# Patient Record
Sex: Male | Born: 1945 | Race: White | Hispanic: No | Marital: Married | Smoking: Never smoker
Health system: Southern US, Community
[De-identification: ages and names within clinical notes are randomized; demographics above are authoritative.]

## PROBLEM LIST (undated history)

## (undated) DIAGNOSIS — E669 Obesity, unspecified: Secondary | ICD-10-CM

## (undated) DIAGNOSIS — R351 Nocturia: Secondary | ICD-10-CM

## (undated) DIAGNOSIS — N400 Enlarged prostate without lower urinary tract symptoms: Secondary | ICD-10-CM

## (undated) DIAGNOSIS — K5792 Diverticulitis of intestine, part unspecified, without perforation or abscess without bleeding: Secondary | ICD-10-CM

## (undated) DIAGNOSIS — Z9889 Other specified postprocedural states: Secondary | ICD-10-CM

## (undated) DIAGNOSIS — Z87898 Personal history of other specified conditions: Secondary | ICD-10-CM

## (undated) DIAGNOSIS — K219 Gastro-esophageal reflux disease without esophagitis: Secondary | ICD-10-CM

## (undated) DIAGNOSIS — I951 Orthostatic hypotension: Secondary | ICD-10-CM

## (undated) DIAGNOSIS — R519 Headache, unspecified: Secondary | ICD-10-CM

## (undated) DIAGNOSIS — Z952 Presence of prosthetic heart valve: Secondary | ICD-10-CM

## (undated) DIAGNOSIS — R21 Rash and other nonspecific skin eruption: Secondary | ICD-10-CM

## (undated) DIAGNOSIS — M1990 Unspecified osteoarthritis, unspecified site: Secondary | ICD-10-CM

## (undated) DIAGNOSIS — E785 Hyperlipidemia, unspecified: Secondary | ICD-10-CM

## (undated) DIAGNOSIS — R319 Hematuria, unspecified: Secondary | ICD-10-CM

## (undated) DIAGNOSIS — N3020 Other chronic cystitis without hematuria: Secondary | ICD-10-CM

## (undated) DIAGNOSIS — R42 Dizziness and giddiness: Secondary | ICD-10-CM

## (undated) DIAGNOSIS — I712 Thoracic aortic aneurysm, without rupture, unspecified: Secondary | ICD-10-CM

## (undated) DIAGNOSIS — Z91041 Radiographic dye allergy status: Secondary | ICD-10-CM

## (undated) DIAGNOSIS — I10 Essential (primary) hypertension: Secondary | ICD-10-CM

## (undated) DIAGNOSIS — I350 Nonrheumatic aortic (valve) stenosis: Secondary | ICD-10-CM

## (undated) DIAGNOSIS — R55 Syncope and collapse: Secondary | ICD-10-CM

## (undated) DIAGNOSIS — R350 Frequency of micturition: Secondary | ICD-10-CM

## (undated) DIAGNOSIS — E871 Hypo-osmolality and hyponatremia: Secondary | ICD-10-CM

## (undated) DIAGNOSIS — R0609 Other forms of dyspnea: Secondary | ICD-10-CM

## (undated) DIAGNOSIS — C61 Malignant neoplasm of prostate: Secondary | ICD-10-CM

## (undated) DIAGNOSIS — R51 Headache: Secondary | ICD-10-CM

## (undated) HISTORY — DX: Gastro-esophageal reflux disease without esophagitis: K21.9

## (undated) HISTORY — DX: Syncope and collapse: R55

## (undated) HISTORY — DX: Dizziness and giddiness: R42

## (undated) HISTORY — PX: CARPAL TUNNEL RELEASE: SHX101

## (undated) HISTORY — DX: Diverticulitis of intestine, part unspecified, without perforation or abscess without bleeding: K57.92

## (undated) HISTORY — DX: Nonrheumatic aortic (valve) stenosis: I35.0

## (undated) HISTORY — DX: Other specified postprocedural states: Z98.890

## (undated) HISTORY — DX: Essential (primary) hypertension: I10

## (undated) HISTORY — DX: Orthostatic hypotension: I95.1

## (undated) HISTORY — DX: Other forms of dyspnea: R06.09

## (undated) HISTORY — PX: ROTATOR CUFF REPAIR: SHX139

## (undated) HISTORY — DX: Presence of prosthetic heart valve: Z95.2

## (undated) HISTORY — DX: Hyperlipidemia, unspecified: E78.5

## (undated) HISTORY — DX: Benign prostatic hyperplasia without lower urinary tract symptoms: N40.0

## (undated) HISTORY — DX: Radiographic dye allergy status: Z91.041

## (undated) HISTORY — DX: Thoracic aortic aneurysm, without rupture, unspecified: I71.20

## (undated) HISTORY — DX: Hypo-osmolality and hyponatremia: E87.1

## (undated) HISTORY — DX: Thoracic aortic aneurysm, without rupture: I71.2

---

## 1983-03-16 HISTORY — PX: BACK SURGERY: SHX140

## 1998-01-06 ENCOUNTER — Ambulatory Visit (HOSPITAL_COMMUNITY): Admission: RE | Admit: 1998-01-06 | Discharge: 1998-01-06 | Payer: Self-pay | Admitting: Family Medicine

## 1999-04-17 ENCOUNTER — Ambulatory Visit (HOSPITAL_COMMUNITY): Admission: RE | Admit: 1999-04-17 | Discharge: 1999-04-17 | Payer: Self-pay | Admitting: Gastroenterology

## 2000-04-04 ENCOUNTER — Encounter: Admission: RE | Admit: 2000-04-04 | Discharge: 2000-04-04 | Payer: Self-pay | Admitting: Family Medicine

## 2000-04-04 ENCOUNTER — Encounter: Payer: Self-pay | Admitting: Family Medicine

## 2001-07-18 ENCOUNTER — Encounter: Payer: Self-pay | Admitting: Family Medicine

## 2001-07-18 ENCOUNTER — Encounter: Admission: RE | Admit: 2001-07-18 | Discharge: 2001-07-18 | Payer: Self-pay | Admitting: Family Medicine

## 2002-01-16 ENCOUNTER — Encounter: Admission: RE | Admit: 2002-01-16 | Discharge: 2002-01-16 | Payer: Self-pay | Admitting: *Deleted

## 2002-01-16 ENCOUNTER — Encounter: Payer: Self-pay | Admitting: *Deleted

## 2002-01-19 ENCOUNTER — Ambulatory Visit (HOSPITAL_BASED_OUTPATIENT_CLINIC_OR_DEPARTMENT_OTHER): Admission: RE | Admit: 2002-01-19 | Discharge: 2002-01-19 | Payer: Self-pay | Admitting: *Deleted

## 2002-01-22 ENCOUNTER — Encounter: Payer: Self-pay | Admitting: Cardiology

## 2002-01-22 ENCOUNTER — Ambulatory Visit (HOSPITAL_COMMUNITY): Admission: RE | Admit: 2002-01-22 | Discharge: 2002-01-22 | Payer: Self-pay | Admitting: Cardiology

## 2002-02-21 ENCOUNTER — Ambulatory Visit (HOSPITAL_BASED_OUTPATIENT_CLINIC_OR_DEPARTMENT_OTHER): Admission: RE | Admit: 2002-02-21 | Discharge: 2002-02-21 | Payer: Self-pay | Admitting: *Deleted

## 2004-01-13 ENCOUNTER — Encounter: Admission: RE | Admit: 2004-01-13 | Discharge: 2004-01-13 | Payer: Self-pay | Admitting: Family Medicine

## 2004-08-25 ENCOUNTER — Ambulatory Visit (HOSPITAL_COMMUNITY): Admission: RE | Admit: 2004-08-25 | Discharge: 2004-08-25 | Payer: Self-pay | Admitting: Family Medicine

## 2004-11-26 ENCOUNTER — Inpatient Hospital Stay (HOSPITAL_COMMUNITY): Admission: EM | Admit: 2004-11-26 | Discharge: 2004-12-02 | Payer: Self-pay | Admitting: Emergency Medicine

## 2005-01-04 ENCOUNTER — Encounter: Admission: RE | Admit: 2005-01-04 | Discharge: 2005-01-04 | Payer: Self-pay | Admitting: General Surgery

## 2005-01-29 ENCOUNTER — Ambulatory Visit (HOSPITAL_COMMUNITY): Admission: RE | Admit: 2005-01-29 | Discharge: 2005-01-30 | Payer: Self-pay | Admitting: General Surgery

## 2005-01-29 ENCOUNTER — Encounter (INDEPENDENT_AMBULATORY_CARE_PROVIDER_SITE_OTHER): Payer: Self-pay | Admitting: Specialist

## 2005-05-21 ENCOUNTER — Emergency Department (HOSPITAL_COMMUNITY): Admission: EM | Admit: 2005-05-21 | Discharge: 2005-05-21 | Payer: Self-pay | Admitting: Emergency Medicine

## 2006-03-15 HISTORY — PX: COLONOSCOPY: SHX174

## 2007-01-09 ENCOUNTER — Encounter: Admission: RE | Admit: 2007-01-09 | Discharge: 2007-01-09 | Payer: Self-pay | Admitting: *Deleted

## 2007-01-11 ENCOUNTER — Ambulatory Visit (HOSPITAL_BASED_OUTPATIENT_CLINIC_OR_DEPARTMENT_OTHER): Admission: RE | Admit: 2007-01-11 | Discharge: 2007-01-11 | Payer: Self-pay | Admitting: *Deleted

## 2007-08-16 ENCOUNTER — Encounter: Admission: RE | Admit: 2007-08-16 | Discharge: 2007-08-16 | Payer: Self-pay | Admitting: Family Medicine

## 2007-11-14 ENCOUNTER — Inpatient Hospital Stay (HOSPITAL_COMMUNITY): Admission: RE | Admit: 2007-11-14 | Discharge: 2007-11-23 | Payer: Self-pay | Admitting: General Surgery

## 2007-11-14 ENCOUNTER — Encounter (INDEPENDENT_AMBULATORY_CARE_PROVIDER_SITE_OTHER): Payer: Self-pay | Admitting: General Surgery

## 2008-03-15 HISTORY — PX: COLON SURGERY: SHX602

## 2008-03-15 HISTORY — PX: CHOLECYSTECTOMY: SHX55

## 2008-12-03 DIAGNOSIS — M1990 Unspecified osteoarthritis, unspecified site: Secondary | ICD-10-CM | POA: Insufficient documentation

## 2008-12-16 DIAGNOSIS — M2040 Other hammer toe(s) (acquired), unspecified foot: Secondary | ICD-10-CM | POA: Insufficient documentation

## 2009-03-15 DIAGNOSIS — C61 Malignant neoplasm of prostate: Secondary | ICD-10-CM

## 2009-03-15 HISTORY — PX: DOPPLER ECHOCARDIOGRAPHY: SHX263

## 2009-03-15 HISTORY — DX: Malignant neoplasm of prostate: C61

## 2009-03-15 HISTORY — PX: PROSTATE SURGERY: SHX751

## 2009-04-09 ENCOUNTER — Ambulatory Visit: Admission: RE | Admit: 2009-04-09 | Discharge: 2009-07-08 | Payer: Self-pay | Admitting: Radiation Oncology

## 2009-07-01 ENCOUNTER — Ambulatory Visit: Admission: RE | Admit: 2009-07-01 | Discharge: 2009-07-01 | Payer: Self-pay | Admitting: Radiation Oncology

## 2009-07-01 LAB — URINALYSIS, MICROSCOPIC - CHCC
Nitrite: NEGATIVE
Protein: NEGATIVE mg/dL
pH: 6 (ref 4.6–8.0)

## 2009-07-08 ENCOUNTER — Ambulatory Visit
Admission: RE | Admit: 2009-07-08 | Discharge: 2009-07-31 | Payer: Self-pay | Source: Home / Self Care | Admitting: Radiation Oncology

## 2009-07-22 LAB — URINALYSIS, MICROSCOPIC - CHCC
Glucose: NEGATIVE g/dL
RBC count: NEGATIVE (ref 0–2)

## 2009-07-23 LAB — URINE CULTURE

## 2009-11-27 ENCOUNTER — Ambulatory Visit: Payer: Self-pay | Admitting: Cardiovascular Disease

## 2009-12-01 ENCOUNTER — Inpatient Hospital Stay (HOSPITAL_BASED_OUTPATIENT_CLINIC_OR_DEPARTMENT_OTHER): Admission: RE | Admit: 2009-12-01 | Discharge: 2009-12-01 | Payer: Self-pay | Admitting: Cardiovascular Disease

## 2009-12-04 ENCOUNTER — Ambulatory Visit: Payer: Self-pay | Admitting: Cardiothoracic Surgery

## 2009-12-05 ENCOUNTER — Encounter: Admission: RE | Admit: 2009-12-05 | Discharge: 2009-12-05 | Payer: Self-pay | Admitting: Cardiothoracic Surgery

## 2009-12-09 ENCOUNTER — Encounter: Payer: Self-pay | Admitting: Cardiothoracic Surgery

## 2009-12-11 ENCOUNTER — Encounter: Payer: Self-pay | Admitting: Cardiothoracic Surgery

## 2009-12-11 ENCOUNTER — Ambulatory Visit: Payer: Self-pay | Admitting: Cardiothoracic Surgery

## 2009-12-11 ENCOUNTER — Inpatient Hospital Stay (HOSPITAL_COMMUNITY): Admission: RE | Admit: 2009-12-11 | Discharge: 2009-12-17 | Payer: Self-pay | Admitting: Cardiothoracic Surgery

## 2009-12-11 HISTORY — PX: THORACIC AORTIC ANEURYSM REPAIR: SHX799

## 2009-12-11 HISTORY — PX: TISSUE AORTIC VALVE REPLACEMENT: SHX2527

## 2009-12-29 ENCOUNTER — Encounter: Admission: RE | Admit: 2009-12-29 | Discharge: 2009-12-29 | Payer: Self-pay | Admitting: Cardiothoracic Surgery

## 2009-12-29 ENCOUNTER — Ambulatory Visit: Payer: Self-pay | Admitting: Cardiothoracic Surgery

## 2010-01-02 ENCOUNTER — Ambulatory Visit: Payer: Self-pay | Admitting: Cardiology

## 2010-01-02 ENCOUNTER — Inpatient Hospital Stay (HOSPITAL_COMMUNITY): Admission: EM | Admit: 2010-01-02 | Discharge: 2010-01-05 | Payer: Self-pay | Admitting: Emergency Medicine

## 2010-01-03 ENCOUNTER — Encounter (INDEPENDENT_AMBULATORY_CARE_PROVIDER_SITE_OTHER): Payer: Self-pay | Admitting: Internal Medicine

## 2010-01-14 ENCOUNTER — Encounter: Admission: RE | Admit: 2010-01-14 | Discharge: 2010-01-14 | Payer: Self-pay | Admitting: Cardiothoracic Surgery

## 2010-01-14 ENCOUNTER — Ambulatory Visit: Payer: Self-pay | Admitting: Cardiothoracic Surgery

## 2010-01-15 ENCOUNTER — Ambulatory Visit: Payer: Self-pay | Admitting: Cardiovascular Disease

## 2010-01-15 ENCOUNTER — Encounter (HOSPITAL_COMMUNITY)
Admission: RE | Admit: 2010-01-15 | Discharge: 2010-04-14 | Payer: Self-pay | Source: Home / Self Care | Attending: Cardiovascular Disease | Admitting: Cardiovascular Disease

## 2010-02-04 ENCOUNTER — Ambulatory Visit: Payer: Self-pay | Admitting: Cardiothoracic Surgery

## 2010-02-16 ENCOUNTER — Encounter: Payer: Self-pay | Admitting: Internal Medicine

## 2010-02-16 DIAGNOSIS — J309 Allergic rhinitis, unspecified: Secondary | ICD-10-CM | POA: Insufficient documentation

## 2010-02-18 ENCOUNTER — Encounter: Payer: Self-pay | Admitting: Internal Medicine

## 2010-02-18 ENCOUNTER — Ambulatory Visit: Payer: Self-pay | Admitting: Internal Medicine

## 2010-02-18 DIAGNOSIS — R42 Dizziness and giddiness: Secondary | ICD-10-CM | POA: Insufficient documentation

## 2010-02-18 DIAGNOSIS — R55 Syncope and collapse: Secondary | ICD-10-CM | POA: Insufficient documentation

## 2010-02-18 HISTORY — DX: Syncope and collapse: R55

## 2010-03-10 ENCOUNTER — Ambulatory Visit: Payer: Self-pay | Admitting: Cardiovascular Disease

## 2010-03-11 ENCOUNTER — Encounter: Payer: Self-pay | Admitting: Internal Medicine

## 2010-03-11 ENCOUNTER — Ambulatory Visit: Payer: Self-pay | Admitting: Cardiothoracic Surgery

## 2010-03-15 HISTORY — PX: OTHER SURGICAL HISTORY: SHX169

## 2010-04-09 ENCOUNTER — Ambulatory Visit
Admission: RE | Admit: 2010-04-09 | Discharge: 2010-04-09 | Payer: Self-pay | Source: Home / Self Care | Attending: Internal Medicine | Admitting: Internal Medicine

## 2010-04-15 ENCOUNTER — Encounter (HOSPITAL_COMMUNITY): Payer: Medicare Other | Attending: Cardiovascular Disease

## 2010-04-15 DIAGNOSIS — I10 Essential (primary) hypertension: Secondary | ICD-10-CM | POA: Insufficient documentation

## 2010-04-15 DIAGNOSIS — I712 Thoracic aortic aneurysm, without rupture, unspecified: Secondary | ICD-10-CM | POA: Insufficient documentation

## 2010-04-15 DIAGNOSIS — Z7982 Long term (current) use of aspirin: Secondary | ICD-10-CM | POA: Insufficient documentation

## 2010-04-15 DIAGNOSIS — I359 Nonrheumatic aortic valve disorder, unspecified: Secondary | ICD-10-CM | POA: Insufficient documentation

## 2010-04-15 DIAGNOSIS — E785 Hyperlipidemia, unspecified: Secondary | ICD-10-CM | POA: Insufficient documentation

## 2010-04-15 DIAGNOSIS — Z87891 Personal history of nicotine dependence: Secondary | ICD-10-CM | POA: Insufficient documentation

## 2010-04-15 DIAGNOSIS — Z5189 Encounter for other specified aftercare: Secondary | ICD-10-CM | POA: Insufficient documentation

## 2010-04-15 DIAGNOSIS — I209 Angina pectoris, unspecified: Secondary | ICD-10-CM | POA: Insufficient documentation

## 2010-04-15 DIAGNOSIS — I509 Heart failure, unspecified: Secondary | ICD-10-CM | POA: Insufficient documentation

## 2010-04-15 DIAGNOSIS — Z8546 Personal history of malignant neoplasm of prostate: Secondary | ICD-10-CM | POA: Insufficient documentation

## 2010-04-15 DIAGNOSIS — Z954 Presence of other heart-valve replacement: Secondary | ICD-10-CM | POA: Insufficient documentation

## 2010-04-16 ENCOUNTER — Encounter (INDEPENDENT_AMBULATORY_CARE_PROVIDER_SITE_OTHER): Payer: Medicare Other

## 2010-04-16 ENCOUNTER — Encounter: Payer: Self-pay | Admitting: Internal Medicine

## 2010-04-16 DIAGNOSIS — R42 Dizziness and giddiness: Secondary | ICD-10-CM

## 2010-04-16 NOTE — Letter (Signed)
Summary: Return To Work  [employer], Main Office  [address]   [place], [place] [address]   Phone: [phone]  Fax: [phone]    02/18/2010  TO: Leodis Sias IT MAY CONCERN   RE: Jeff Johnson [address] [place]   The above named individual is under my medical care and may return to cardiac rehab on: 02/20/10.   If you have any further questions or need additional information, please call.     Sincerely,    Dr.Cidney Kirkwood Russ Halo, RN, BSN

## 2010-04-16 NOTE — Letter (Signed)
Summary: Return To Work  [employer], Main Office  [address]   [place], [place] [address]   Phone: [phone]  Fax: [phone]    02/18/2010  TO: Leodis Sias IT MAY CONCERN   RE: SHAYMUS EVELETH [address] [place]   The above named individual is under my medical care and may return to work on: 02/19/10.  If you have any further questions or need additional information, please call.     Sincerely,    Dr.Gregg Russ Halo, RN, BSN

## 2010-04-16 NOTE — Assessment & Plan Note (Signed)
Summary: nep. eval for tilt table. dx: post-op, orthostatic, hypotnesi...   Visit Type:  Follow-up   History of Present Illness: Jeff Johnson returns today for followup.  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  Post op he has had some problems with beta blocker resulting in worsening orthostasis.  He has improved greatly since stopping his beta blocker.  Since stopping his beta blocker he has improved with no syncope.    Current Medications (verified): 1)  Aspirin Ec 325 Mg Tbec (Aspirin) .... Take One Tablet By Mouth Daily 2)  Fluoxetine Hcl 20 Mg Caps (Fluoxetine Hcl) .... Once Daily 3)  Lansoprazole 30 Mg Tbdp (Lansoprazole) .... Once Daily 4)  Simvastatin 20 Mg Tabs (Simvastatin) .... Take One Tablet By Mouth Daily At Bedtime 5)  Tramadol Hcl 50 Mg Tabs (Tramadol Hcl) .... As Needed  Allergies (verified): 1)  ! * Ivp Dye  Past History:  Past Medical History: Last updated: 02/18/2010 Current Problems:  HYPOTENSION (ICD-458.9) ORTHOSTATIC DIZZINESS (ICD-780.4)    Past Surgical History: S/P AVR/aortic root repair.  Review of Systems       All systems reviewed and negative except as noted in the HPI.  Vital Signs:  Patient profile:   65 year old male Pulse (ortho):   82 / minute BP standing:   96 / 58  Vitals Entered By: Laurance Flatten CMA (February 18, 2010 2:48 PM)  Serial Vital Signs/Assessments:  Time      Position  BP       Pulse  Resp  Temp     By           Lying RA  110/71   80                    Jewel Hardy CMA           Sitting   110/62   68                    Jewel Hardy CMA           Standing  96/58    82                    Jewel Hardy CMA           Standing  110/60   62                    Jewel Hardy CMA           Standing  100/60   82                    Jewel Hardy CMA   Physical Exam  General:  Well developed, well nourished, in no acute distress.  HEENT: normal Neck: supple. No JVD. Carotids 2+  bilaterally no bruits Cor: RRR no rubs, gallops or murmur Lungs: CTA. His incision is well healed. Ab: soft, nontender. nondistended. No HSM. Good bowel sounds Ext: warm. no cyanosis, clubbing or edema Neuro: alert and oriented. Grossly nonfocal. affect pleasant    Impression & Recommendations:  Problem # 1:  ORTHOSTATIC DIZZINESS (ICD-780.4) His symptoms are much improved since he stopped taking his beta blocker. I have asked him to increase his sodium intake.  Problem # 2:  SYNCOPE (ICD-780.2) He has had no additional episodes. His updated medication list for this problem includes:    Aspirin  Ec 325 Mg Tbec (Aspirin) .Marland Kitchen... 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  Patient Instructions: 1)  Your physician wants you to follow-up in 3-4 months with Dr Court Joy will receive a reminder letter in the mail two months in advance. If you don't receive a letter, please call our office to schedule the follow-up appointment.

## 2010-04-16 NOTE — Assessment & Plan Note (Signed)
Summary: device/saf   Visit Type:  Follow-up   History of Present Illness: Jeff Johnson returns today for followup.  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  Post op he has had some problems with beta blocker resulting in worsening orthostasis.  When I last saw him he has had no frank syncope. He notes frequent episodes of dizziness and near syncope. He c/o feeling weak and fatigued. At times he is dizzy. He c/o urinary frequency. He is up nearly 6 times at night to urinate.  Current Medications (verified): 1)  Aspirin Ec 325 Mg Tbec (Aspirin) .... Take One Tablet By Mouth Daily 2)  Fluoxetine Hcl 20 Mg Caps (Fluoxetine Hcl) .... Once Daily 3)  Lansoprazole 30 Mg Tbdp (Lansoprazole) .... Once Daily 4)  Simvastatin 20 Mg Tabs (Simvastatin) .... Take One Tablet By Mouth Daily At Bedtime 5)  Jalyn 0.5-0.4 Mg Caps (Dutasteride-Tamsulosin Hcl) .... Once Daily  Allergies (verified): 1)  ! * Ivp Dye  Past History:  Past Medical History: Last updated: 02/18/2010 Current Problems:  HYPOTENSION (ICD-458.9) ORTHOSTATIC DIZZINESS (ICD-780.4)    Past Surgical History: Last updated: 02/18/2010 S/P AVR/aortic root repair.  Review of Systems       The patient complains of dyspnea on exertion.  The patient denies chest pain, syncope, and peripheral edema.    Vital Signs:  Patient profile:   65 year old male Height:      72 inches Weight:      232 pounds BMI:     31.58 Pulse rate:   64 / minute BP sitting:   120 / 70  (left arm)  Vitals Entered By: Laurance Flatten CMA (April 09, 2010 11:02 AM)  Physical Exam  General:  Well developed, well nourished, in no acute distress.  HEENT: normal Neck: supple. No JVD. Carotids 2+ bilaterally no bruits Cor: RRR no rubs, gallops or murmur Lungs: CTA. His incision is well healed. Ab: soft, nontender. nondistended. No HSM. Good bowel sounds Ext: warm. no cyanosis, clubbing or edema Neuro: alert and  oriented. Grossly nonfocal. affect pleasant    Impression & Recommendations:  Problem # 1:  ORTHOSTATIC DIZZINESS (ICD-780.4) It is unclear whether his symptoms are due to orthostasis or symptomatic arrhythmias. I have recommended a holter monitor and a 24 hour ambulatory blood pressure monitor.  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 The etiology of his current symptoms is unclear.  Patient Instructions: 1)  Your physician recommends that you schedule a follow-up appointment in: 3-4 weeks with Dr Ladona Ridgel 2)  Your physician has recommended that you wear a holter monitor.  Holter monitors are medical devices that record the heart's electrical activity. Doctors most often use these monitors to diagnose arrhythmias. Arrhythmias are problems with the speed or rhythm of the heartbeat. The monitor is a small, portable device. You can wear one while you do your normal daily activities. This is usually used to diagnose what is causing palpitations/syncope (passing out). 3)  24 hour BP monitor

## 2010-04-16 NOTE — Letter (Signed)
Summary: [facility] Office Visit Note   [facility] Office Visit Note   Imported By: Roderic Ovens 02/24/2010 15:28:00  _____________________________________________________________________  External Attachment:    Type:   Image     Comment:   External Document

## 2010-04-17 ENCOUNTER — Encounter (HOSPITAL_COMMUNITY): Payer: Medicare Other

## 2010-04-20 ENCOUNTER — Encounter (HOSPITAL_COMMUNITY): Payer: Medicare Other

## 2010-04-22 ENCOUNTER — Encounter (HOSPITAL_COMMUNITY): Payer: Medicare Other

## 2010-04-23 ENCOUNTER — Telehealth: Payer: Self-pay | Admitting: Internal Medicine

## 2010-04-24 ENCOUNTER — Encounter (HOSPITAL_COMMUNITY): Payer: Medicare Other

## 2010-04-27 ENCOUNTER — Encounter (HOSPITAL_COMMUNITY): Payer: Medicare Other

## 2010-04-29 ENCOUNTER — Encounter: Payer: Self-pay | Admitting: Internal Medicine

## 2010-04-29 ENCOUNTER — Encounter (HOSPITAL_COMMUNITY): Payer: Medicare Other

## 2010-04-30 NOTE — Progress Notes (Signed)
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  Wants to know if this is ok? Dennis Bast, RN, BSN  April 23, 2010 1:50 PM Dr Ladona Ridgel came over and looked at both BP monitor and holitor and said pt ok to return to work 25-45 hours per week.  Pt aware Dennis Bast, RN, BSN  April 23, 2010 3:59 PM

## 2010-05-01 ENCOUNTER — Encounter (HOSPITAL_COMMUNITY): Payer: Medicare Other

## 2010-05-04 ENCOUNTER — Encounter (HOSPITAL_COMMUNITY): Payer: Medicare Other

## 2010-05-06 ENCOUNTER — Other Ambulatory Visit: Payer: Self-pay | Admitting: Cardiothoracic Surgery

## 2010-05-06 ENCOUNTER — Encounter (HOSPITAL_COMMUNITY): Payer: Medicare Other

## 2010-05-06 DIAGNOSIS — I712 Thoracic aortic aneurysm, without rupture: Secondary | ICD-10-CM

## 2010-05-06 NOTE — Procedures (Signed)
Summary: summary report  summary report   Imported By: Mirna Mires 04/29/2010 11:36:45  _____________________________________________________________________  External Attachment:    Type:   Image     Comment:   External Document

## 2010-05-06 NOTE — Progress Notes (Signed)
Summary: [facility]: Office Visit  [facility]: Office Visit   Imported By: Earl Many 04/27/2010 09:27:27  _____________________________________________________________________  External Attachment:    Type:   Image     Comment:   External Document

## 2010-05-06 NOTE — Procedures (Signed)
Summary: bp readings  bp readings   Imported By: Mirna Mires 04/29/2010 11:38:42  _____________________________________________________________________  External Attachment:    Type:   Image     Comment:   External Document

## 2010-05-08 ENCOUNTER — Encounter (HOSPITAL_COMMUNITY): Payer: Medicare Other

## 2010-05-11 ENCOUNTER — Encounter (HOSPITAL_COMMUNITY): Payer: Medicare Other

## 2010-05-13 ENCOUNTER — Encounter (HOSPITAL_COMMUNITY): Payer: Medicare Other

## 2010-05-15 ENCOUNTER — Ambulatory Visit (INDEPENDENT_AMBULATORY_CARE_PROVIDER_SITE_OTHER): Payer: Medicare Other | Admitting: Cardiovascular Disease

## 2010-05-15 ENCOUNTER — Encounter (HOSPITAL_COMMUNITY): Payer: BLUE CROSS/BLUE SHIELD

## 2010-05-15 DIAGNOSIS — I2510 Atherosclerotic heart disease of native coronary artery without angina pectoris: Secondary | ICD-10-CM

## 2010-05-15 DIAGNOSIS — I359 Nonrheumatic aortic valve disorder, unspecified: Secondary | ICD-10-CM

## 2010-05-15 DIAGNOSIS — Z951 Presence of aortocoronary bypass graft: Secondary | ICD-10-CM

## 2010-05-15 DIAGNOSIS — E7800 Pure hypercholesterolemia, unspecified: Secondary | ICD-10-CM

## 2010-05-18 ENCOUNTER — Encounter (HOSPITAL_COMMUNITY): Payer: BLUE CROSS/BLUE SHIELD

## 2010-05-18 DIAGNOSIS — Z8679 Personal history of other diseases of the circulatory system: Secondary | ICD-10-CM | POA: Insufficient documentation

## 2010-05-20 ENCOUNTER — Encounter (HOSPITAL_COMMUNITY): Payer: BLUE CROSS/BLUE SHIELD

## 2010-05-22 ENCOUNTER — Encounter (HOSPITAL_COMMUNITY): Payer: BLUE CROSS/BLUE SHIELD

## 2010-05-25 ENCOUNTER — Encounter (HOSPITAL_COMMUNITY): Payer: BLUE CROSS/BLUE SHIELD

## 2010-05-27 ENCOUNTER — Encounter: Payer: Self-pay | Admitting: Internal Medicine

## 2010-05-27 ENCOUNTER — Encounter (HOSPITAL_COMMUNITY): Payer: BLUE CROSS/BLUE SHIELD

## 2010-05-27 LAB — GLUCOSE, CAPILLARY
Glucose-Capillary: 109 mg/dL — ABNORMAL HIGH (ref 70–99)
Glucose-Capillary: 111 mg/dL — ABNORMAL HIGH (ref 70–99)
Glucose-Capillary: 123 mg/dL — ABNORMAL HIGH (ref 70–99)

## 2010-05-27 LAB — BASIC METABOLIC PANEL
BUN: 6 mg/dL (ref 6–23)
BUN: 6 mg/dL (ref 6–23)
BUN: 6 mg/dL (ref 6–23)
BUN: 16 mg/dL (ref 6–23)
BUN: 18 mg/dL (ref 6–23)
BUN: 19 mg/dL (ref 6–23)
CO2: 20 mEq/L (ref 19–32)
CO2: 24 mEq/L (ref 19–32)
CO2: 28 mEq/L (ref 19–32)
CO2: 30 mEq/L (ref 19–32)
CO2: 31 mEq/L (ref 19–32)
Calcium: 8.5 mg/dL (ref 8.4–10.5)
Calcium: 6.8 mg/dL — ABNORMAL LOW (ref 8.4–10.5)
Calcium: 8.1 mg/dL — ABNORMAL LOW (ref 8.4–10.5)
Calcium: 8.2 mg/dL — ABNORMAL LOW (ref 8.4–10.5)
Chloride: 100 mEq/L (ref 96–112)
Chloride: 104 mEq/L (ref 96–112)
Chloride: 103 mEq/L (ref 96–112)
Chloride: 99 mEq/L (ref 96–112)
Chloride: 99 mEq/L (ref 96–112)
Creatinine, Ser: 1.09 mg/dL (ref 0.4–1.5)
Creatinine, Ser: 0.82 mg/dL (ref 0.4–1.5)
Creatinine, Ser: 0.92 mg/dL (ref 0.4–1.5)
Creatinine, Ser: 0.97 mg/dL (ref 0.4–1.5)
GFR calc Af Amer: >60 mL/min (ref >60)
GFR calc Af Amer: >60 mL/min (ref >60)
GFR calc Af Amer: >60 mL/min (ref >60)
GFR calc non Af Amer: >60 mL/min (ref >60)
GFR calc non Af Amer: >60 mL/min (ref >60)
GFR calc non Af Amer: >60 mL/min (ref >60)
GFR calc non Af Amer: >60 mL/min (ref >60)
Glucose, Bld: 101 mg/dL — ABNORMAL HIGH (ref 70–99)
Glucose, Bld: 103 mg/dL — ABNORMAL HIGH (ref 70–99)
Glucose, Bld: 104 mg/dL — ABNORMAL HIGH (ref 70–99)
Glucose, Bld: 119 mg/dL — ABNORMAL HIGH (ref 70–99)
Glucose, Bld: 126 mg/dL — ABNORMAL HIGH (ref 70–99)
Potassium: 4 mEq/L (ref 3.5–5.1)
Potassium: 4.1 mEq/L (ref 3.5–5.1)
Potassium: 4.1 mEq/L (ref 3.5–5.1)
Potassium: 3.8 mEq/L (ref 3.5–5.1)
Potassium: 4.3 mEq/L (ref 3.5–5.1)
Potassium: 5.2 mEq/L — ABNORMAL HIGH (ref 3.5–5.1)
Sodium: 132 mEq/L — ABNORMAL LOW (ref 135–145)
Sodium: 135 mEq/L (ref 135–145)
Sodium: 134 mEq/L — ABNORMAL LOW (ref 135–145)
Sodium: 136 mEq/L (ref 135–145)
Sodium: 137 mEq/L (ref 135–145)

## 2010-05-27 LAB — URINE CULTURE
Colony Count: 10000
Culture  Setup Time: 201110211150
Culture  Setup Time: 201110022055

## 2010-05-27 LAB — TROPONIN I
Troponin I: 0.01 ng/mL (ref 0.00–0.06)
Troponin I: <0.01 ng/mL (ref 0.00–0.06)

## 2010-05-27 LAB — URINALYSIS, ROUTINE W REFLEX MICROSCOPIC
Bilirubin Urine: NEGATIVE
Bilirubin Urine: NEGATIVE
Glucose, UA: NEGATIVE mg/dL
Glucose, UA: NEGATIVE mg/dL
Hgb urine dipstick: NEGATIVE
Ketones, ur: NEGATIVE mg/dL
Ketones, ur: NEGATIVE mg/dL
Leukocytes, UA: NEGATIVE
Nitrite: NEGATIVE
Nitrite: NEGATIVE
Protein, ur: NEGATIVE mg/dL
Specific Gravity, Urine: 1.011 (ref 1.005–1.030)
Specific Gravity, Urine: 1.016 (ref 1.005–1.030)
Urobilinogen, UA: 0.2 mg/dL (ref 0.0–1.0)
pH: 6.5 (ref 5.0–8.0)
pH: 6 (ref 5.0–8.0)

## 2010-05-27 LAB — CBC
HCT: 27.1 % — ABNORMAL LOW (ref 39.0–52.0)
HCT: 28 % — ABNORMAL LOW (ref 39.0–52.0)
HCT: 30.5 % — ABNORMAL LOW (ref 39.0–52.0)
HCT: 30.1 % — ABNORMAL LOW (ref 39.0–52.0)
Hemoglobin: 9 g/dL — ABNORMAL LOW (ref 13.0–17.0)
Hemoglobin: 9.4 g/dL — ABNORMAL LOW (ref 13.0–17.0)
Hemoglobin: 10 g/dL — ABNORMAL LOW (ref 13.0–17.0)
Hemoglobin: 9.9 g/dL — ABNORMAL LOW (ref 13.0–17.0)
MCH: 27.6 pg (ref 26.0–34.0)
MCH: 28.1 pg (ref 26.0–34.0)
MCH: 29.4 pg (ref 26.0–34.0)
MCH: 29.8 pg (ref 26.0–34.0)
MCHC: 33.2 g/dL (ref 30.0–36.0)
MCHC: 33.6 g/dL (ref 30.0–36.0)
MCHC: 32.9 g/dL (ref 30.0–36.0)
MCHC: 33.1 g/dL (ref 30.0–36.0)
MCV: 81.4 fL (ref 78.0–100.0)
MCV: 82.7 fL (ref 78.0–100.0)
MCV: 89.3 fL (ref 78.0–100.0)
Platelets: 351 10*3/uL (ref 150–400)
Platelets: 117 10*3/uL — ABNORMAL LOW (ref 150–400)
RBC: 3.69 MIL/uL — ABNORMAL LOW (ref 4.22–5.81)
RBC: 3.37 MIL/uL — ABNORMAL LOW (ref 4.22–5.81)
RDW: 13 % (ref 11.5–15.5)
RDW: 13.3 % (ref 11.5–15.5)
RDW: 13.3 % (ref 11.5–15.5)
RDW: 13.4 % (ref 11.5–15.5)
WBC: 16 10*3/uL — ABNORMAL HIGH (ref 4.0–10.5)

## 2010-05-27 LAB — DIFFERENTIAL
Eosinophils Absolute: 0.1 10*3/uL (ref 0.0–0.7)
Lymphocytes Relative: 19 % (ref 12–46)
Lymphs Abs: 1.2 10*3/uL (ref 0.7–4.0)
Monocytes Relative: 9 % (ref 3–12)
Neutrophils Relative %: 70 % (ref 43–77)

## 2010-05-27 LAB — HEPATIC FUNCTION PANEL
ALT: 24 U/L (ref 0–53)
AST: 21 U/L (ref 0–37)
Albumin: 3.2 g/dL — ABNORMAL LOW (ref 3.5–5.2)
Alkaline Phosphatase: 98 U/L (ref 39–117)
Indirect Bilirubin: 1.1 mg/dL — ABNORMAL HIGH (ref 0.3–0.9)
Total Protein: 6.7 g/dL (ref 6.0–8.3)

## 2010-05-27 LAB — CK TOTAL AND CKMB (NOT AT ARMC)
CK, MB: 1.2 ng/mL (ref 0.3–4.0)
Relative Index: INVALID (ref 0.0–2.5)

## 2010-05-27 LAB — URINE MICROSCOPIC-ADD ON

## 2010-05-28 LAB — POCT I-STAT 4, (NA,K, GLUC, HGB,HCT)
Glucose, Bld: 103 mg/dL — ABNORMAL HIGH (ref 70–99)
Glucose, Bld: 105 mg/dL — ABNORMAL HIGH (ref 70–99)
Glucose, Bld: 111 mg/dL — ABNORMAL HIGH (ref 70–99)
Glucose, Bld: 121 mg/dL — ABNORMAL HIGH (ref 70–99)
Glucose, Bld: 132 mg/dL — ABNORMAL HIGH (ref 70–99)
Glucose, Bld: 134 mg/dL — ABNORMAL HIGH (ref 70–99)
Glucose, Bld: 167 mg/dL — ABNORMAL HIGH (ref 70–99)
Glucose, Bld: 172 mg/dL — ABNORMAL HIGH (ref 70–99)
HCT: 26 % — ABNORMAL LOW (ref 39.0–52.0)
HCT: 26 % — ABNORMAL LOW (ref 39.0–52.0)
HCT: 29 % — ABNORMAL LOW (ref 39.0–52.0)
HCT: 30 % — ABNORMAL LOW (ref 39.0–52.0)
HCT: 30 % — ABNORMAL LOW (ref 39.0–52.0)
HCT: 34 % — ABNORMAL LOW (ref 39.0–52.0)
HCT: 37 % — ABNORMAL LOW (ref 39.0–52.0)
HCT: 37 % — ABNORMAL LOW (ref 39.0–52.0)
Hemoglobin: 10.2 g/dL — ABNORMAL LOW (ref 13.0–17.0)
Hemoglobin: 10.2 g/dL — ABNORMAL LOW (ref 13.0–17.0)
Hemoglobin: 11.6 g/dL — ABNORMAL LOW (ref 13.0–17.0)
Hemoglobin: 12.6 g/dL — ABNORMAL LOW (ref 13.0–17.0)
Hemoglobin: 12.6 g/dL — ABNORMAL LOW (ref 13.0–17.0)
Hemoglobin: 8.8 g/dL — ABNORMAL LOW (ref 13.0–17.0)
Hemoglobin: 8.8 g/dL — ABNORMAL LOW (ref 13.0–17.0)
Hemoglobin: 9.9 g/dL — ABNORMAL LOW (ref 13.0–17.0)
Potassium: 3.7 mEq/L (ref 3.5–5.1)
Potassium: 4 mEq/L (ref 3.5–5.1)
Potassium: 4.1 mEq/L (ref 3.5–5.1)
Potassium: 4.1 mEq/L (ref 3.5–5.1)
Potassium: 4.3 mEq/L (ref 3.5–5.1)
Potassium: 4.6 mEq/L (ref 3.5–5.1)
Potassium: 4.8 mEq/L (ref 3.5–5.1)
Potassium: 5 mEq/L (ref 3.5–5.1)
Sodium: 133 mEq/L — ABNORMAL LOW (ref 135–145)
Sodium: 133 mEq/L — ABNORMAL LOW (ref 135–145)
Sodium: 135 mEq/L (ref 135–145)
Sodium: 136 mEq/L (ref 135–145)
Sodium: 138 mEq/L (ref 135–145)
Sodium: 139 mEq/L (ref 135–145)
Sodium: 139 mEq/L (ref 135–145)
Sodium: 140 mEq/L (ref 135–145)

## 2010-05-28 LAB — CBC
HCT: 31.2 % — ABNORMAL LOW (ref 39.0–52.0)
HCT: 32.4 % — ABNORMAL LOW (ref 39.0–52.0)
HCT: 35 % — ABNORMAL LOW (ref 39.0–52.0)
HCT: 36.5 % — ABNORMAL LOW (ref 39.0–52.0)
HCT: 39.9 % (ref 39.0–52.0)
Hemoglobin: 10.5 g/dL — ABNORMAL LOW (ref 13.0–17.0)
Hemoglobin: 11 g/dL — ABNORMAL LOW (ref 13.0–17.0)
Hemoglobin: 12 g/dL — ABNORMAL LOW (ref 13.0–17.0)
Hemoglobin: 12.5 g/dL — ABNORMAL LOW (ref 13.0–17.0)
Hemoglobin: 13.8 g/dL (ref 13.0–17.0)
MCH: 29.6 pg (ref 26.0–34.0)
MCH: 29.6 pg (ref 26.0–34.0)
MCH: 29.7 pg (ref 26.0–34.0)
MCH: 29.9 pg (ref 26.0–34.0)
MCH: 30 pg (ref 26.0–34.0)
MCHC: 33.7 g/dL (ref 30.0–36.0)
MCHC: 34 g/dL (ref 30.0–36.0)
MCHC: 34.2 g/dL (ref 30.0–36.0)
MCHC: 34.3 g/dL (ref 30.0–36.0)
MCHC: 34.6 g/dL (ref 30.0–36.0)
MCV: 86.4 fL (ref 78.0–100.0)
MCV: 86.7 fL (ref 78.0–100.0)
MCV: 86.7 fL (ref 78.0–100.0)
MCV: 87.1 fL (ref 78.0–100.0)
MCV: 88.9 fL (ref 78.0–100.0)
Platelets: 126 10*3/uL — ABNORMAL LOW (ref 150–400)
Platelets: 140 10*3/uL — ABNORMAL LOW (ref 150–400)
Platelets: 148 10*3/uL — ABNORMAL LOW (ref 150–400)
Platelets: 169 10*3/uL (ref 150–400)
Platelets: 175 10*3/uL (ref 150–400)
RBC: 3.51 MIL/uL — ABNORMAL LOW (ref 4.22–5.81)
RBC: 3.72 MIL/uL — ABNORMAL LOW (ref 4.22–5.81)
RBC: 4.05 MIL/uL — ABNORMAL LOW (ref 4.22–5.81)
RBC: 4.21 MIL/uL — ABNORMAL LOW (ref 4.22–5.81)
RBC: 4.6 MIL/uL (ref 4.22–5.81)
RDW: 12.7 % (ref 11.5–15.5)
RDW: 12.7 % (ref 11.5–15.5)
RDW: 12.8 % (ref 11.5–15.5)
RDW: 13 % (ref 11.5–15.5)
RDW: 13.4 % (ref 11.5–15.5)
WBC: 10.7 10*3/uL — ABNORMAL HIGH (ref 4.0–10.5)
WBC: 12.5 10*3/uL — ABNORMAL HIGH (ref 4.0–10.5)
WBC: 14.2 10*3/uL — ABNORMAL HIGH (ref 4.0–10.5)
WBC: 17.3 10*3/uL — ABNORMAL HIGH (ref 4.0–10.5)
WBC: 5.2 10*3/uL (ref 4.0–10.5)

## 2010-05-28 LAB — POCT I-STAT 3, ART BLOOD GAS (G3+)
Acid-base deficit: 1 mmol/L (ref 0.0–2.0)
Acid-base deficit: 3 mmol/L — ABNORMAL HIGH (ref 0.0–2.0)
Acid-base deficit: 4 mmol/L — ABNORMAL HIGH (ref 0.0–2.0)
Acid-base deficit: 4 mmol/L — ABNORMAL HIGH (ref 0.0–2.0)
Acid-base deficit: 4 mmol/L — ABNORMAL HIGH (ref 0.0–2.0)
Acid-base deficit: 5 mmol/L — ABNORMAL HIGH (ref 0.0–2.0)
Bicarbonate: 21.6 mEq/L (ref 20.0–24.0)
Bicarbonate: 21.9 mEq/L (ref 20.0–24.0)
Bicarbonate: 21.9 mEq/L (ref 20.0–24.0)
Bicarbonate: 22.1 mEq/L (ref 20.0–24.0)
Bicarbonate: 24 mEq/L (ref 20.0–24.0)
Bicarbonate: 25.1 mEq/L — ABNORMAL HIGH (ref 20.0–24.0)
Bicarbonate: 26.8 mEq/L — ABNORMAL HIGH (ref 20.0–24.0)
O2 Saturation: 100 %
O2 Saturation: 100 %
O2 Saturation: 100 %
O2 Saturation: 91 %
O2 Saturation: 97 %
O2 Saturation: 97 %
O2 Saturation: 99 %
Patient temperature: 35
Patient temperature: 36.6
Patient temperature: 36.7
Patient temperature: 37.3
TCO2: 23 mmol/L (ref 0–100)
TCO2: 23 mmol/L (ref 0–100)
TCO2: 23 mmol/L (ref 0–100)
TCO2: 23 mmol/L (ref 0–100)
TCO2: 25 mmol/L (ref 0–100)
TCO2: 26 mmol/L (ref 0–100)
TCO2: 29 mmol/L (ref 0–100)
pCO2 arterial: 39.9 mmHg (ref 35.0–45.0)
pCO2 arterial: 40 mmHg (ref 35.0–45.0)
pCO2 arterial: 43.1 mmHg (ref 35.0–45.0)
pCO2 arterial: 43.4 mmHg (ref 35.0–45.0)
pCO2 arterial: 43.7 mmHg (ref 35.0–45.0)
pCO2 arterial: 43.7 mmHg (ref 35.0–45.0)
pCO2 arterial: 47.7 mmHg — ABNORMAL HIGH (ref 35.0–45.0)
pCO2 arterial: 56.4 mmHg — ABNORMAL HIGH (ref 35.0–45.0)
pH, Arterial: 7.285 — ABNORMAL LOW (ref 7.350–7.450)
pH, Arterial: 7.307 — ABNORMAL LOW (ref 7.350–7.450)
pH, Arterial: 7.31 — ABNORMAL LOW (ref 7.350–7.450)
pH, Arterial: 7.311 — ABNORMAL LOW (ref 7.350–7.450)
pH, Arterial: 7.316 — ABNORMAL LOW (ref 7.350–7.450)
pH, Arterial: 7.332 — ABNORMAL LOW (ref 7.350–7.450)
pH, Arterial: 7.367 (ref 7.350–7.450)
pH, Arterial: 7.4 (ref 7.350–7.450)
pO2, Arterial: 101 mmHg — ABNORMAL HIGH (ref 80.0–100.0)
pO2, Arterial: 136 mmHg — ABNORMAL HIGH (ref 80.0–100.0)
pO2, Arterial: 194 mmHg — ABNORMAL HIGH (ref 80.0–100.0)
pO2, Arterial: 329 mmHg — ABNORMAL HIGH (ref 80.0–100.0)
pO2, Arterial: 373 mmHg — ABNORMAL HIGH (ref 80.0–100.0)
pO2, Arterial: 58 mmHg — ABNORMAL LOW (ref 80.0–100.0)
pO2, Arterial: 96 mmHg (ref 80.0–100.0)

## 2010-05-28 LAB — APTT
aPTT: 29 seconds (ref 24–37)
aPTT: 39 seconds — ABNORMAL HIGH (ref 24–37)

## 2010-05-28 LAB — COMPREHENSIVE METABOLIC PANEL
ALT: 15 U/L (ref 0–53)
AST: 19 U/L (ref 0–37)
Albumin: 3.7 g/dL (ref 3.5–5.2)
Alkaline Phosphatase: 63 U/L (ref 39–117)
BUN: 10 mg/dL (ref 6–23)
CO2: 22 mEq/L (ref 19–32)
Calcium: 8.7 mg/dL (ref 8.4–10.5)
Chloride: 106 mEq/L (ref 96–112)
Creatinine, Ser: 0.95 mg/dL (ref 0.4–1.5)
GFR calc Af Amer: >60 mL/min (ref >60)
GFR calc non Af Amer: >60 mL/min (ref >60)
Glucose, Bld: 147 mg/dL — ABNORMAL HIGH (ref 70–99)
Potassium: 4.1 mEq/L (ref 3.5–5.1)
Sodium: 137 mEq/L (ref 135–145)
Total Bilirubin: 1.7 mg/dL — ABNORMAL HIGH (ref 0.3–1.2)
Total Protein: 6.3 g/dL (ref 6.0–8.3)

## 2010-05-28 LAB — POCT I-STAT, CHEM 8
BUN: 10 mg/dL (ref 6–23)
BUN: 18 mg/dL (ref 6–23)
Calcium, Ion: 1.12 mmol/L (ref 1.12–1.32)
Calcium, Ion: 1.13 mmol/L (ref 1.12–1.32)
Chloride: 101 mEq/L (ref 96–112)
Chloride: 107 mEq/L (ref 96–112)
Creatinine, Ser: 0.8 mg/dL (ref 0.4–1.5)
Creatinine, Ser: 0.8 mg/dL (ref 0.4–1.5)
Glucose, Bld: 142 mg/dL — ABNORMAL HIGH (ref 70–99)
Glucose, Bld: 191 mg/dL — ABNORMAL HIGH (ref 70–99)
HCT: 32 % — ABNORMAL LOW (ref 39.0–52.0)
HCT: 35 % — ABNORMAL LOW (ref 39.0–52.0)
Hemoglobin: 10.9 g/dL — ABNORMAL LOW (ref 13.0–17.0)
Hemoglobin: 11.9 g/dL — ABNORMAL LOW (ref 13.0–17.0)
Potassium: 4.3 mEq/L (ref 3.5–5.1)
Potassium: 4.7 mEq/L (ref 3.5–5.1)
Sodium: 139 mEq/L (ref 135–145)
Sodium: 139 mEq/L (ref 135–145)
TCO2: 23 mmol/L (ref 0–100)
TCO2: 27 mmol/L (ref 0–100)

## 2010-05-28 LAB — GLUCOSE, CAPILLARY
Glucose-Capillary: 102 mg/dL — ABNORMAL HIGH (ref 70–99)
Glucose-Capillary: 119 mg/dL — ABNORMAL HIGH (ref 70–99)
Glucose-Capillary: 135 mg/dL — ABNORMAL HIGH (ref 70–99)
Glucose-Capillary: 145 mg/dL — ABNORMAL HIGH (ref 70–99)
Glucose-Capillary: 145 mg/dL — ABNORMAL HIGH (ref 70–99)
Glucose-Capillary: 149 mg/dL — ABNORMAL HIGH (ref 70–99)
Glucose-Capillary: 150 mg/dL — ABNORMAL HIGH (ref 70–99)
Glucose-Capillary: 151 mg/dL — ABNORMAL HIGH (ref 70–99)
Glucose-Capillary: 167 mg/dL — ABNORMAL HIGH (ref 70–99)
Glucose-Capillary: 168 mg/dL — ABNORMAL HIGH (ref 70–99)
Glucose-Capillary: 185 mg/dL — ABNORMAL HIGH (ref 70–99)
Glucose-Capillary: 193 mg/dL — ABNORMAL HIGH (ref 70–99)
Glucose-Capillary: 99 mg/dL (ref 70–99)

## 2010-05-28 LAB — HEMOGLOBIN AND HEMATOCRIT, BLOOD
HCT: 30.8 % — ABNORMAL LOW (ref 39.0–52.0)
Hemoglobin: 10.5 g/dL — ABNORMAL LOW (ref 13.0–17.0)

## 2010-05-28 LAB — TYPE AND SCREEN
ABO/RH(D): O POS
Antibody Screen: NEGATIVE

## 2010-05-28 LAB — BLOOD GAS, ARTERIAL
Acid-Base Excess: 0.4 mmol/L (ref 0.0–2.0)
Bicarbonate: 24.4 mEq/L — ABNORMAL HIGH (ref 20.0–24.0)
Drawn by: 206361
FIO2: 0.21 %
O2 Saturation: 98.2 %
Patient temperature: 98.6
TCO2: 25.6 mmol/L (ref 0–100)
pCO2 arterial: 38.4 mmHg (ref 35.0–45.0)
pH, Arterial: 7.419 (ref 7.350–7.450)
pO2, Arterial: 104 mmHg — ABNORMAL HIGH (ref 80.0–100.0)

## 2010-05-28 LAB — CREATININE, SERUM
Creatinine, Ser: 0.87 mg/dL (ref 0.4–1.5)
Creatinine, Ser: 0.99 mg/dL (ref 0.4–1.5)
GFR calc Af Amer: >60 mL/min (ref >60)
GFR calc Af Amer: >60 mL/min (ref >60)
GFR calc non Af Amer: >60 mL/min (ref >60)
GFR calc non Af Amer: >60 mL/min (ref >60)

## 2010-05-28 LAB — SURGICAL PCR SCREEN
MRSA, PCR: NEGATIVE
Staphylococcus aureus: NEGATIVE

## 2010-05-28 LAB — HEMOGLOBIN A1C
Hgb A1c MFr Bld: 5.8 % (ref <5.7)
Mean Plasma Glucose: 120 mg/dL — ABNORMAL HIGH (ref <117)

## 2010-05-28 LAB — POCT I-STAT 3, VENOUS BLOOD GAS (G3P V)
Bicarbonate: 26.5 mEq/L — ABNORMAL HIGH (ref 20.0–24.0)
O2 Saturation: 62 %
TCO2: 28 mmol/L (ref 0–100)
pCO2, Ven: 48.6 mmHg (ref 45.0–50.0)
pO2, Ven: 35 mmHg (ref 30.0–45.0)

## 2010-05-28 LAB — URINALYSIS, ROUTINE W REFLEX MICROSCOPIC
Bilirubin Urine: NEGATIVE
Glucose, UA: NEGATIVE mg/dL
Hgb urine dipstick: NEGATIVE
Ketones, ur: NEGATIVE mg/dL
Nitrite: NEGATIVE
Protein, ur: NEGATIVE mg/dL
Specific Gravity, Urine: 1.021 (ref 1.005–1.030)
Urobilinogen, UA: 1 mg/dL (ref 0.0–1.0)
pH: 6 (ref 5.0–8.0)

## 2010-05-28 LAB — PROTIME-INR
INR: 1.05 (ref 0.00–1.49)
INR: 1.31 (ref 0.00–1.49)
INR: 1.4 (ref 0.00–1.49)
Prothrombin Time: 13.9 seconds (ref 11.6–15.2)
Prothrombin Time: 16.5 seconds — ABNORMAL HIGH (ref 11.6–15.2)
Prothrombin Time: 17.4 seconds — ABNORMAL HIGH (ref 11.6–15.2)

## 2010-05-28 LAB — BASIC METABOLIC PANEL
BUN: 12 mg/dL (ref 6–23)
CO2: 24 mEq/L (ref 19–32)
Calcium: 8.2 mg/dL — ABNORMAL LOW (ref 8.4–10.5)
Chloride: 109 mEq/L (ref 96–112)
Creatinine, Ser: 1.02 mg/dL (ref 0.4–1.5)
GFR calc Af Amer: >60 mL/min (ref >60)
GFR calc non Af Amer: >60 mL/min (ref >60)
Glucose, Bld: 150 mg/dL — ABNORMAL HIGH (ref 70–99)
Potassium: 4.6 mEq/L (ref 3.5–5.1)
Sodium: 140 mEq/L (ref 135–145)

## 2010-05-28 LAB — PREPARE PLATELETS

## 2010-05-28 LAB — MAGNESIUM
Magnesium: 2.4 mg/dL (ref 1.5–2.5)
Magnesium: 2.4 mg/dL (ref 1.5–2.5)
Magnesium: 2.9 mg/dL — ABNORMAL HIGH (ref 1.5–2.5)

## 2010-05-28 LAB — ABO/RH: ABO/RH(D): O POS

## 2010-05-28 LAB — PLATELET COUNT: Platelets: 99 10*3/uL — ABNORMAL LOW (ref 150–400)

## 2010-05-29 ENCOUNTER — Emergency Department (HOSPITAL_COMMUNITY)
Admission: EM | Admit: 2010-05-29 | Discharge: 2010-05-29 | Disposition: A | Discharge status: Home / Self Care | Payer: Medicare Other | Attending: Emergency Medicine | Admitting: Emergency Medicine

## 2010-05-29 ENCOUNTER — Encounter (HOSPITAL_COMMUNITY): Payer: BLUE CROSS/BLUE SHIELD

## 2010-05-29 DIAGNOSIS — I10 Essential (primary) hypertension: Secondary | ICD-10-CM | POA: Insufficient documentation

## 2010-05-29 DIAGNOSIS — R197 Diarrhea, unspecified: Secondary | ICD-10-CM | POA: Insufficient documentation

## 2010-05-29 DIAGNOSIS — Z9089 Acquired absence of other organs: Secondary | ICD-10-CM | POA: Insufficient documentation

## 2010-05-29 DIAGNOSIS — R112 Nausea with vomiting, unspecified: Secondary | ICD-10-CM | POA: Insufficient documentation

## 2010-06-01 ENCOUNTER — Encounter (HOSPITAL_COMMUNITY): Payer: BLUE CROSS/BLUE SHIELD

## 2010-06-03 ENCOUNTER — Encounter (HOSPITAL_COMMUNITY): Payer: BLUE CROSS/BLUE SHIELD

## 2010-06-03 ENCOUNTER — Ambulatory Visit: Payer: Medicare Other | Admitting: Cardiothoracic Surgery

## 2010-06-03 ENCOUNTER — Inpatient Hospital Stay: Admission: RE | Admit: 2010-06-03 | Payer: Medicare Other | Source: Ambulatory Visit

## 2010-06-04 ENCOUNTER — Other Ambulatory Visit: Payer: Self-pay | Admitting: Family Medicine

## 2010-06-04 DIAGNOSIS — H53129 Transient visual loss, unspecified eye: Secondary | ICD-10-CM

## 2010-06-05 ENCOUNTER — Encounter (HOSPITAL_COMMUNITY): Payer: BLUE CROSS/BLUE SHIELD

## 2010-06-05 ENCOUNTER — Encounter (INDEPENDENT_AMBULATORY_CARE_PROVIDER_SITE_OTHER): Payer: Medicare Other | Admitting: *Deleted

## 2010-06-05 DIAGNOSIS — R0989 Other specified symptoms and signs involving the circulatory and respiratory systems: Secondary | ICD-10-CM

## 2010-06-06 ENCOUNTER — Emergency Department (HOSPITAL_COMMUNITY)
Admission: EM | Admit: 2010-06-06 | Discharge: 2010-06-07 | Disposition: A | Discharge status: Home / Self Care | Payer: Medicare Other | Attending: Emergency Medicine | Admitting: Emergency Medicine

## 2010-06-06 DIAGNOSIS — Z8546 Personal history of malignant neoplasm of prostate: Secondary | ICD-10-CM | POA: Insufficient documentation

## 2010-06-06 DIAGNOSIS — I10 Essential (primary) hypertension: Secondary | ICD-10-CM | POA: Insufficient documentation

## 2010-06-06 DIAGNOSIS — E119 Type 2 diabetes mellitus without complications: Secondary | ICD-10-CM | POA: Insufficient documentation

## 2010-06-06 DIAGNOSIS — R319 Hematuria, unspecified: Secondary | ICD-10-CM | POA: Insufficient documentation

## 2010-06-06 LAB — URINE MICROSCOPIC-ADD ON

## 2010-06-06 LAB — URINALYSIS, ROUTINE W REFLEX MICROSCOPIC
Ketones, ur: NEGATIVE mg/dL
Leukocytes, UA: NEGATIVE
Nitrite: NEGATIVE
Protein, ur: NEGATIVE mg/dL
Urobilinogen, UA: 1 mg/dL (ref 0.0–1.0)

## 2010-06-07 LAB — POCT I-STAT, CHEM 8
BUN: 10 mg/dL (ref 6–23)
Creatinine, Ser: 0.9 mg/dL (ref 0.4–1.5)
Glucose, Bld: 104 mg/dL — ABNORMAL HIGH (ref 70–99)
Hemoglobin: 12.2 g/dL — ABNORMAL LOW (ref 13.0–17.0)
Sodium: 139 mEq/L (ref 135–145)
TCO2: 23 mmol/L (ref 0–100)

## 2010-06-08 ENCOUNTER — Encounter (HOSPITAL_COMMUNITY): Payer: BLUE CROSS/BLUE SHIELD

## 2010-06-08 LAB — URINE CULTURE: Culture: NO GROWTH

## 2010-06-10 ENCOUNTER — Ambulatory Visit: Payer: Self-pay | Admitting: Internal Medicine

## 2010-06-10 ENCOUNTER — Encounter (HOSPITAL_COMMUNITY): Payer: BLUE CROSS/BLUE SHIELD

## 2010-06-12 ENCOUNTER — Encounter (HOSPITAL_COMMUNITY): Payer: BLUE CROSS/BLUE SHIELD

## 2010-06-15 ENCOUNTER — Encounter (HOSPITAL_COMMUNITY): Payer: BLUE CROSS/BLUE SHIELD

## 2010-06-17 ENCOUNTER — Encounter (HOSPITAL_COMMUNITY): Payer: BLUE CROSS/BLUE SHIELD

## 2010-06-17 ENCOUNTER — Encounter: Payer: Self-pay | Admitting: Family Medicine

## 2010-06-19 ENCOUNTER — Encounter (HOSPITAL_COMMUNITY): Payer: BLUE CROSS/BLUE SHIELD

## 2010-06-22 ENCOUNTER — Encounter (HOSPITAL_COMMUNITY): Payer: BLUE CROSS/BLUE SHIELD

## 2010-06-24 ENCOUNTER — Encounter (HOSPITAL_COMMUNITY): Payer: BLUE CROSS/BLUE SHIELD

## 2010-06-26 ENCOUNTER — Encounter (HOSPITAL_COMMUNITY): Payer: BLUE CROSS/BLUE SHIELD

## 2010-06-29 ENCOUNTER — Encounter (HOSPITAL_COMMUNITY): Payer: BLUE CROSS/BLUE SHIELD

## 2010-07-01 ENCOUNTER — Encounter (HOSPITAL_COMMUNITY): Payer: BLUE CROSS/BLUE SHIELD

## 2010-07-03 ENCOUNTER — Encounter (HOSPITAL_COMMUNITY): Payer: BLUE CROSS/BLUE SHIELD

## 2010-07-06 ENCOUNTER — Ambulatory Visit (INDEPENDENT_AMBULATORY_CARE_PROVIDER_SITE_OTHER): Payer: Medicare Other | Admitting: Internal Medicine

## 2010-07-06 ENCOUNTER — Encounter (HOSPITAL_COMMUNITY): Payer: BLUE CROSS/BLUE SHIELD

## 2010-07-06 ENCOUNTER — Encounter: Payer: Self-pay | Admitting: Internal Medicine

## 2010-07-06 ENCOUNTER — Encounter: Payer: Self-pay | Admitting: *Deleted

## 2010-07-06 DIAGNOSIS — R42 Dizziness and giddiness: Secondary | ICD-10-CM

## 2010-07-06 DIAGNOSIS — I951 Orthostatic hypotension: Secondary | ICD-10-CM

## 2010-07-06 DIAGNOSIS — R55 Syncope and collapse: Secondary | ICD-10-CM

## 2010-07-06 DIAGNOSIS — I7101 Dissection of thoracic aorta: Secondary | ICD-10-CM

## 2010-07-06 NOTE — Assessment & Plan Note (Signed)
His symptoms are improved. I've encouraged him to keep active and exercise. No changes in medications today.

## 2010-07-06 NOTE — Assessment & Plan Note (Signed)
His symptoms are improved but still he has occasional dizzy spells. His blood pressure is stable. Previously I encouraged him to increase his sodium intake and these recommendations still apply.

## 2010-07-06 NOTE — Patient Instructions (Signed)
Your physician wants you to follow-up in: 6 months with Dr Taylor You will receive a reminder letter in the mail two months in advance. If you don't receive a letter, please call our office to schedule the follow-up appointment.  

## 2010-07-06 NOTE — Progress Notes (Signed)
HPI [name] returns today for followup. He is a pleasant 65 year old man with a history of thoracic aneurysm status post repair. History of hypertension. He has dyslipidemia. The patient developed orthostasis when I saw him 3 months ago. We discontinued his beta blocker and his symptoms improved. 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 He denied associated palpitations. He denies chest pain. He does note that when he exerts himself he is weak. Allergies  Allergen Reactions  . Iodinated Diagnostic Agents      Current Outpatient Prescriptions  Medication Sig Dispense Refill  . aspirin 325 MG EC tablet Take 325 mg by mouth daily.        . Dutasteride-Tamsulosin HCl 0.5-0.4 MG CAPS Take 1 capsule by mouth daily.        . enalapril (VASOTEC) 10 MG tablet Take 10 mg by mouth daily.        Marland Kitchen FLUoxetine (PROZAC) 20 MG capsule Take 20 mg by mouth daily.        . lansoprazole (PREVACID) 30 MG capsule Take 30 mg by mouth daily.        . simvastatin (ZOCOR) 20 MG tablet Take 20 mg by mouth at bedtime.        Marland Kitchen DISCONTD: simvastatin (ZOCOR) 20 MG tablet Take 20 mg by mouth at bedtime.          Past Medical History  Diagnosis Date  . Hypotension, unspecified   . Dizziness and giddiness     ROS:   All systems reviewed and negative except as noted in the HPI.   Past Surgical History  Procedure Date  . Tissue aortic valve replacement 12/11/09  . Thoracic aortic aneurysm repair 12/11/09    ASCENDING THORACIC AORTIC ANEURYSM REPAIR     No family history on file.   History   Social History  . Marital Status: Married    Spouse Name: N/A    Number of Children: N/A  . Years of Education: N/A   Occupational History  . Not on file.   Social History Main Topics  . Smoking status: Former Games developer  . Smokeless tobacco: Not on file  . Alcohol Use: Not on file  . Drug Use: Not on file  . Sexually Active: Not on file   Other Topics  Concern  . Not on file   Social History Narrative  . No narrative on file     BP 104/64  Pulse 68  Ht 6' (1.829 m)  Wt 235 lb (106.595 kg)  BMI 31.87 kg/m2  Physical Exam:  Well appearing NAD HEENT: Unremarkable Neck:  No JVD, no thyromegally Lymphatics:  No adenopathy Back:  No CVA tenderness Lungs:  Clear. Well-healed median sternotomy. HEART:  Regular rate rhythm, no murmurs, no rubs, no clicks Abd:  Flat, positive bowel sounds, no organomegally, no rebound, no guarding Ext:  2 plus pulses, no edema, no cyanosis, no clubbing Skin:  No rashes no nodules Neuro:  CN II through XII intact, motor grossly intact  EKG Normal sinus rhythm. Left axis deviation.  Assess/Plan:

## 2010-07-06 NOTE — Assessment & Plan Note (Signed)
He is status post surgery. He will followup with his surgeon. No chest pain.

## 2010-07-28 NOTE — Assessment & Plan Note (Signed)
OFFICE VISIT   Jeff Johnson, Jeff Johnson  DOB:  10/14/1945                                        January 14, 2010  CHART #:  [id number]   CURRENT PROBLEMS:  1. Status post aortic valve replacement with a biologic-Bentall      procedure for aortic insufficiency and replacement of the ascending      fusiform aneurysm, December 11, 2009.  2. Postoperative anemia.  3. Deconditioning.   PRESENT ILLNESS:  The patient is a very nice 65 year old male who  returns for a 5-week followup after undergoing a biologic-Bentall  procedure for a bicuspid aortic valve and an ascending fusiform aortic  aneurysm.  This measured approximately 5.5 cm and he had mild to  moderate aortic insufficiency with normal coronaries.  He was readmitted  to the hospital approximately 2 weeks after his discharge with shortness  of breath and weakness.  A CT scan showed the aortic repair to be intact  without pulmonary embolus.  A 2-D echo showed good LV function with  normal functioning of the bioprosthetic aortic valve and no pericardial  effusion.  His weakness and deconditioning were felt to be due to slow  postop recovery.  He was in a sinus rhythm and his hemoglobin was mildly  reduced at 9.2.  He was discharged home and currently he remains on  Zocor 20 mg, Prozac 40 mg daily, Flomax 0.4 mg daily, aspirin 81 mg  daily, Lopressor 12.5 mg daily, Prilosec 30 mg a day, and Ultram p.r.n.  pain.  He has been taking some Ativan for anxiety and depression issues.   He states he can walk a block and easily, but the first 3 seconds are  somewhat difficult, then his breathing improves.  He denies productive  cough, fever, drainage from the sternum, edema, or orthopnea.  He has  been sleeping better and his appetite is also improving.  It appears he  is slowly improving.   PHYSICAL EXAMINATION:  Vital Signs:  Blood pressure 105/70, pulse 81 and  regular, respirations 18, and saturation 90% on room air.   General:  He  is alert and pleasant.  Lungs:  Breath sounds are clear and equal.  The  sternum is well healed.  Cardiac:  Rhythm is regular without murmur or  gallop.  Extremities:  There is no pedal edema and he has good strength  in all extremities.  He is able to walk up and down the office hall  without difficulty.   DIAGNOSTIC TESTS:  A PA and lateral chest x-ray shows stable cardiac  silhouette, intact sternal wires, and no evidence of pulmonary edema or  pleural effusion.   IMPRESSION AND PLAN:  The patient is slowly recovering from his surgery,  which required a Bentall aortic root and ascending aortic replacement  with a circulatory arrest.  He is scheduled to start outpatient rehab on  Monday, which I feel is his best treatment at this time.  I have  reviewed his Ativan prescription to take 0.5-1 mg only once a day as  needed for anxiety issues.  I have encouraged him to continue to take a  10-minute walk twice a day and I told him he could start driving doing  light errands for his wife.  This should help his overall mood and we  will plan on seeing  him back in 4 weeks after he completes his first  step in outpatient rehab.   Kerin Perna, M.D.  Electronically Signed   PV/MEDQ  D:  01/14/2010  T:  01/14/2010  Job:  [id number]   cc:   Vesta Mixer, M.D.

## 2010-07-28 NOTE — Consult Note (Signed)
NEW PATIENT CONSULTATION   Jeff Johnson, Jeff Johnson  DOB:  1945-06-27                                        December 04, 2009  CHART #:  [id number]   PRIMARY CARE PHYSICIAN:  Maryelizabeth Rowan, MD   REASON FOR CONSULTATION:  Severe aortic stenosis.   CHIEF COMPLAINT:  Exertional chest pain and shortness of breath.   HISTORY OF PRESENT ILLNESS:  I was asked to evaluate this 65 year old  Caucasian male, nonsmoker for possible aortic valve replacement for  recently diagnosed severe aortic stenosis.  The patient has had  progressive symptoms of exertional shortness of breath and chest  tightness over the past few months.  He completed a course of radiation  therapy for prostate cancer, but had persistent weakness and decreasing  exercise tolerance.  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  He denies resting symptoms  of orthopnea or PND.  He also has associated chest tightness associated  with exertion.  He was referred by his primary care physician, Dr.  Elease Hashimoto, who performed a stress test.  This showed normal LVEF.  A 2-D  echo was performed, which showed moderate-to-severe aortic stenosis with  a calculated aortic valve area of 0.75.  There was LVH as well as  diastolic dysfunction of the ventricle.  There is no significant MR or  AI.  The aortic root appeared to be normal.  The patient subsequently  underwent left and right heart cath.  His PA pressures were 24/8 with a  wedge of 9 and his cardiac output was 3.5 L and his mixed venous  saturation is 62%.  The aortic valve gradient was 24 mmHg and the aortic  valve area was 0.8 sq. cm.  The ventricular ejection showed normal LVEF  of 65%.  There appeared to be some enlargement of the ascending aorta on  the ventriculogram.  The patient has not had a CT scan of the chest to  assess his aorta.   The  patient denies history of rheumatic heart disease.  He denies  history of a longstanding cardiac murmur.  He never had problems with  physical exertion while in school or playing sports.   The coronaries on his cath were all without significant disease.   PAST MEDICAL HISTORY:  1. Hypertension.  2. 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 Chipper Herb completed in May 2011.  3. Bladder outlet obstruction, better on Flomax therapy.  4. Recent urinary tract infection treated with a course of Cipro, now      resolved.  5. 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  6. Status post cholecystectomy and sigmoid colon resection in 2010.   HOME MEDICATIONS:  1. Simvastatin 20 mg daily.  2. Prozac 40 mg daily.  3. Prevacid 30 mg daily.  4. Enalapril 10 mg daily.  5. VESIcare 10 mg a day.  6. Flomax 0.4 mg a day.  7. Aspirin 81 mg a day.   FAMILY HISTORY:  Positive for coronary disease and his father who died  of an MI at age 71 and several uncles.   SOCIAL HISTORY:  The patient works as a Engineer, materials  part-time.  Does not smoke or drink alcohol.  He has a daughter, who is a Engineer, civil (consulting).   REVIEW OF SYSTEMS:  CONSTITUTIONAL:  Negative for fever or weight loss.  ENT:  Negative for active dental problems.  He sees a dentist regularly  once a year and has had no recent significant dental complaints.  THORACIC:  Negative for history of chest trauma, abnormal chest x-ray,  productive cough, or hemoptysis.  CARDIAC:  Positive for his cardiac murmur, aortic stenosis, normal LV  function, class III CHF, class III angina, and normal coronaries.  GI:  Positive for GERD, status post cholecystectomy for gallstones,  status post sigmoid colon resection for diverticular disease.  No recent  GI symptoms.  UROLOGIC:  Positive for his prostate cancer, some bladder outlet  obstruction, and recent UTI treated successfully with Cipro.   VASCULAR:  Negative for DVT, claudication, or TIA.  ENDOCRINE:  Negative for diabetes or thyroid disease.  NEUROLOGIC:  Negative for stroke or seizure.  HEMATOLOGIC:  Negative for bleeding disorder or blood transfusion.  He  is right-hand dominant.   PHYSICAL EXAMINATION:  Vital Signs:  The patient is 6 feet tall and  weighs 255 pounds.  Blood pressure 118/74, pulse 70, respirations 18,  and saturation on room air 96%.  General:  He is alert and oriented  middle-aged male, in no acute distress, accompanied by his wife.  HEENT:  Normocephalic.  Pupils are equal.  Dentition good.  Neck:  Without JVD,  mass, or bruit.  Lymphatics:  No palpable supraclavicular or cervical  adenopathy.  Lungs:  Breath sounds are clear and equal.  There is no  thoracic deformity.  Cardiac:  Regular rhythm and he has a soft grade 1-  2/6 systolic ejection murmur at the right upper sternal border.  There  is no diastolic murmur and no S3 gallop.  Abdomen:  Obese, soft, and  nontender without pulsatile mass palpable.  Extremities:  No clubbing,  cyanosis, or edema.  Peripheral pulses are 2+, strong in all  extremities.  Neurologic:  Alert and oriented without focal motor  deficit.  He is able to walk comfortably down the hallway in the office.   LABORATORY DATA:  His last chest x-ray shows no active disease.  His  cardiac cath, 2-D echo, and recent CT scans are reviewed.  He has  moderate-to-severe aortic stenosis and normal coronaries on his  ventriculogram and suggestion of an enlarged ascending aorta, which is  not demonstrated on his recent CT scans, all of which are abdominal -  pelvic scans.   RECOMMENDATIONS:  He would benefit from aortic valve replacement.  With  his age 65 years and history of prostate cancer with radiation to the  pelvis, I would recommend avoiding long-term Coumadin commitment and  would replace his valve with a bioprosthetic valve with which the  patient agrees.  We will obtain a  CT scan without contrast to assess the  diameter of his ascending aorta and perform pre-CABG Dopplers.  His  surgery will be scheduled in 1 week, on December 11, 2009.  I discussed  the procedure, indication, benefits, and risks with the patient and wife  and they understand and agreed.   Kerin Perna, M.D.  Electronically Signed   PV/MEDQ  D:  12/04/2009  T:  12/05/2009  Job:  [id number]   cc:   Vesta Mixer, M.D.  Maryelizabeth Rowan, M.D.

## 2010-07-28 NOTE — Assessment & Plan Note (Signed)
OFFICE VISIT   Jeff Johnson, Jeff Johnson  DOB:  Oct 08, 1945                                        December 29, 2009  CHART #:  [id number]   REASON FOR OFFICE VISIT:  The patient fell and hit sternum against  bathtub.   HISTORY OF PRESENT ILLNESS:  This is a 65 year old Caucasian male who  was found to have an ascending thoracic aortic aneurysm and severe  aortic stenosis.  The patient underwent aortic valve replacement (Magna  Ease pericardial tissue valve) and replacement of fusiform ascending  aortic aneurysm with a 30-mm Hemashield straight graft with hemi-arch  reconstruction by Dr. Donata Clay on December 11, 2009.  The patient had  a fairly uneventful hospital course stay.  He was found to have  thrombocytopenia and acute blood loss anemia postoperatively.  He was  placed on Nu-Iron for his anemia.  He was discharged in stable condition  on December 17, 2009.  The patient states his complaints include  difficulty sleeping and shortness of breath that occurs sometimes with  exertion and sometimes while at rest.  In addition, the patient states  he lost his balance last Thursday while he was trying to sit on the  toilet.  As a result, the patient hit his chest against the bathtub.  The patient denies any chest pain, fever, or chills.   PHYSICAL EXAMINATION:  General:  This is a 65 year old Caucasian male  accompanied by his wife, who is alert, oriented, and cooperative, in no  acute distress.  Vital Signs:  BP is 108/74, heart rate 74, respirations  20, and O2 sat 97% on room air.  Cardiovascular:  Regular rate and  rhythm.  S1 and S2 without any murmurs, gallops, or rubs.  Pulmonary:  Clear to auscultation bilaterally.  No rales, wheezes, or rhonchi.  Abdomen:  Soft and nontender.  Bowel sounds present.  Extremities:  No  cyanosis, clubbing, or edema bilaterally.  Sternum is solid.  Wound is  clean, dry, and well healed.  No sign of infection.   DIAGNOSTIC TESTS:   Chest x-ray done today shows mild cardiomegaly.  Lungs clear.  No effusion.  No pneumothorax.   IMPRESSION AND PLAN:  1. The patient was referred to his medical doctor, Dr. Duanne Guess for      further evaluation for difficulty sleeping as well as what appears      to be the etiology of most of his episodes of shortness of breath.      It was discussed with the patient that he is very deconditioned and      that occasional shortness of breath with exertion is not uncommon.      The patient does state that the shortness of breath that he      experiences now is nothing as bad as he had experienced prior to      his surgery.  The patient does have a history of depression, but to      his knowledge, has never had a panic attack, but he is feeling more      anxious since having been discharged home from the hospital.  2. The patient's wife states that she accidentally discarded the      remainder of the patient's Nu-Iron.  I instructed her she may      obtain over-the-counter iron  and take it until the end of the month      and then the patient may stop this.  In addition, the patient had      been discharged on Mucinex and this does not seem to be helping his      cough.  His cough is mostly a dry cough, nonproductive.  He was      instructed he may stop this.  3. The patient has not seen his cardiologist, Dr. Elease Hashimoto in followup      yet.  We have made an appointment for him to see Dr. Elease Hashimoto on      January 15, 2010, at 9:30 in the morning.  In addition, the patient      apparently cancelled his followup appointment with his urologist.      The patient states that he continues to urinate at all hours of the      night and wishes to discuss his medications (Flomax and VESIcare)      with his urologist.  An appointment has been made for him to see      the urologist's assistant on December 31, 2009.  4. The patient was instructed once all the aforementioned is resolved,      he may begin driving  short distances (less than 30 minutes) during      the day and gradually increase his frequency and duration as      tolerates provided he is not taking any narcotics.  In addition,      the patient was encouraged to continue with sternal precautions (no      lifting more than 10 pounds for 3-4 more weeks).  Finally, the      patient was encouraged to participate in cardiac rehab once he      begins feeling better.  The patient will return to see Dr. Donata Clay in followup appointment in 2 weeks with a chest x-ray.  The      patient was instructed to contact the office if there is any      problems, questions, or concerns in the interim.   Doree Fudge, PA   DZ/MEDQ  D:  12/29/2009  T:  12/30/2009  Job:  [id number]   cc:   Vesta Mixer, M.D.  Maryelizabeth Rowan, M.D.  Dr. Janey Greaser

## 2010-07-28 NOTE — Op Note (Signed)
NAME:  Jeff Johnson, Jeff Johnson NO.:  [id number]   MEDICAL RECORD NO.:  [id number]          PATIENT TYPE:  AMB   LOCATION:  DSC                          FACILITY:  [facility]   PHYSICIAN:  Tennis Must Meyerdierks, M.D.DATE OF BIRTH:  1946-01-27   DATE OF PROCEDURE:  01/11/2007  DATE OF DISCHARGE:  01/11/2007                               OPERATIVE REPORT   PREOPERATIVE DIAGNOSIS:  Carpometacarpal joint arthritis, left thumb.   POSTOPERATIVE DIAGNOSIS:  Carpometacarpal joint arthritis, left thumb.   PROCEDURE:  Excision of trapezium with anchovy tendon arthroplasty using  palmaris longus tendon, left hand.   SURGEON:  Lowell Bouton, M.D.   ANESTHESIA:  Axillary block.   OPERATIVE FINDINGS:  The patient had significant degenerative changes  over the base of the first metacarpal and the distal trapezium.  There  were also milder arthritic changes at the scaphotrapezial joint.   PROCEDURE:  Under axillary block anesthesia with a tourniquet on the  left arm, the left hand was prepped and draped in the usual fashion and  after exsanguinating the limb, the tourniquet was inflated to 250 mmHg.  A gently curved incision was made over the dorsal radial aspect of the  thumb CMC joint and carried down through the subcutaneous tissues.  Bleeding points were coagulated.  Sharp dissection was carried down  between the tendon of the abductor pollicis longus and extensor pollicis  brevis.  The capsule was incised sharply down to the trapezium and it  was elevated off the trapezium.  Alms retractors were inserted.  The  trapezium was then divided into quarters with an osteotome and the  trapezium was removed in a piecemeal fashion.  The FCR tendon was found  to be frayed and retracted in the base of the wound.  The distal end of  it was debrided.  After completely excising the trapezium and examining  the wrist under fluoroscopic control to make sure the trapezium was  completely excised, an anchovy tendon was then taken from the volar  aspect of the forearm through multiple small transverse incisions.  Palmaris longus tendon was identified distally and was transected  distally.  It was then brought from distal to proximal up into the  forearm, completely excising the tendon.  The graft was then placed in  saline-soaked gauze and the wounds were irrigated and closed with 4-0  nylon sutures in the forearm wounds.  The tendon was then rolled into an  anchovy after making sure there was no impingement of the first  metacarpal on any remaining bone.  The anchovy was secured in the empty  space from the trapezium using 4-0 Mersilene sutures in the capsular  tissue at the base of the wound.  A second 4-0 Mersilene was then tied  over the anchovy, securing it into the wound.  The wound was then  irrigated copiously with saline.  The capsular tissue was closed with 4-  0 Mersilene, the subcutaneous tissue was closed over a  vessel loop drain using 4-0 Vicryl, and the skin was closed with a 3-0  subcuticular Prolene.  Steri-Strips  were applied, followed by sterile  dressings and a thumb spica splint.  The patient tolerated the procedure  well and went to the recovery room awake and stable in good condition.      Lowell Bouton, M.D.  Electronically Signed     EMM/MEDQ  D:  01/11/2007  T:  01/12/2007  Job:  [id number]

## 2010-07-28 NOTE — Assessment & Plan Note (Signed)
OFFICE VISIT   MCKENZIE, TORUNO  DOB:  March 27, 1945                                        March 11, 2010  CHART #:  [id number]   CURRENT PROBLEMS:  1. Status post biologic Bentall procedure for aortic insufficiency and      a fusiform ascending aneurysm, September 2011.  2. Intolerance to beta-blockers.  3. Postoperative orthostatic hypotension and dizziness, now improved.   PRESENT ILLNESS:  The patient returns for a followup exam to monitor his  progress after the above operation.  He is back to working part-time and  is going to rehab and making good progress in his exercise tolerance.  He has been having some indigestion recently which he feels is related  to his multivitamin.  He denies any angina or symptoms of CHF.  He is  having significant problems with polyuria and nocturia which is limited  him from getting a good night's rest.  He is scheduled to see his  urologist in January as he has had a history of treated prostate cancer.  He has been evaluated by Dr. Ladona Ridgel for some atrial arrhythmias as well  as some isolated PVCs, reports no new medication is started.   CURRENT MEDICATIONS:  1. Simvastatin.  2. Prozac.  3. Flomax.  4. Aspirin 81 mg.  5. Ultram p.r.n. pain.  6. P.r.n. Tylenol.   PHYSICAL EXAMINATION:  Vital Signs:  Blood pressure 108/70, pulse is 75,  sinus by rhythm strip, saturation 99%, temperature 98.4, and weight 222  pounds.  General:  He is alert and pleasant and appears stronger than I  have seen him since surgery.  Lungs:  Breath sounds are clear and equal.  The sternum is stable and well healed.  Cardiac:  Rhythm is regular  without murmur or gallop.  He does have some ectopy on auscultation.  Extremities:  He has no peripheral edema.   DIAGNOSTIC TESTS:  A chest x-ray was not performed today.  He had some  electrolytes checked recently by one of his other physicians and was  told they were all fine.   IMPRESSION:   Overall, I think he is making good progress now since his  surgery in September.  At 3 months, he can progress his activity to  include more lifting and increasing intervals at work.  I have  encouraged him to complete the postoperative cardiac rehab phase II  program.  We will plan on seeing him back approximately 6 months after  surgery to perform a CT angiogram to assess his aortic repair.  No new  prescriptions were provided at this time.   Kerin Perna, M.D.  Electronically Signed   PV/MEDQ  D:  03/11/2010  T:  03/12/2010  Job:  [id number]   cc:   Vesta Mixer, M.D.  Doylene Canning. Ladona Ridgel, MD  Maryelizabeth Rowan, M.D.

## 2010-07-28 NOTE — H&P (Signed)
NAME:  Jeff Johnson, Jeff Johnson NO.:  [id number]   MEDICAL RECORD NO.:  [id number]          PATIENT TYPE:  INP   LOCATION:  0005                         FACILITY:  [facility]   PHYSICIAN:  Anselm Pancoast. Weatherly, M.D.DATE OF BIRTH:  1945/07/21   DATE OF ADMISSION:  11/14/2007  DATE OF DISCHARGE:                              HISTORY & PHYSICAL   CHIEF COMPLAINT:  Recurrent diverticulitis.   HISTORY:  Jeff Johnson is a 65 year old overweight Caucasian male who I  first met approximately 4 years ago when he had problems with  diverticulitis, but he was also having problems with gallbladder and we  proceeded with an elective laparoscopic cholecystectomy.  At the time  you could see an area of inflammation in the proximal sigmoid colon.  With medical management, he has done satisfactory, but he has recently  been having recurrent episodes of diverticulitis and has been on  and  off Cipro and Flagyl several times over the past year.  He has a  colonoscopy by Dr. Reece Agar approximately a year ago that showed  diverticulosis and diverticulitis more in the descending sigmoid,  proximal sigmoid colon area.  I saw him in the office in July.  He had  had a recent CT in June that showed no evidence of acute sigmoid  diverticulitis, and the sigmoid colon showed diverticular changes in the  left colon but no frank diverticulitis.  Because of the recurrent  episodes he and I tend to feel that an elective resection is needed  fearing that he will have problems with diverticulitis and need a  colostomy, otherwise, because of the frequent episodes.  He has actually  had 1 more episode that was treated with Flagyl and Cipro over the last  several weeks, and had been tentatively scheduled for surgery today.  I  did not repeat the CT or the colonoscopy.  His laboratory studies today  showed a white count of 6700, hematocrit of 44.  He said that the bowel  prep was bad making him hurt all over and  bloated, cramping.  He did get  most of the GoLYTELY in and the antibiotics since he was on Cipro and  Flagyl was not needed __________ erythromycin.  The patient has also a  history of increased cholesterol, mildly elevated glucose.  He has  nitroglycerin, but he has not required it for approximately 2 years and  he is still working as a Engineer, materials, kind of a sedentary type job.   CHRONIC MEDICATIONS:  1. Fluoxetine 20 mg a day.  2. Lipitor 20 mg.  3. Aciphex 20 mg.  4. Enalapril 10 mg a day.  5. He takes a baby aspirin and, of course, he has nitroglycerin, but      he has not required it recently.   States he is allergic to [facility] and IVP dye.   1. He has had a cholecystectomy back in 2006 by me.  2. He has had a  hand injury that required surgery.   PHYSICAL EXAMINATION:  He weighed about 260 pounds when he was seen at  the office in July.  His wife says his weight now is about 250.  He is 5  feet 11 inches.  Blood pressure under good control, pulse was 70.  EYES, EARS, NOSE, AND THROAT:  It appears that he feels poorly, but he  said he was feeling better until the bowel prep yesterday.  The  anesthesiologist has given approximately a liter of fluids, and I got a  repeat BUN and creatinine, potassium.  These were all normal.  LUNGS:  He has good breath sounds bilaterally.  CARDIAC:  Normal sinus rhythm.  ABDOMEN:  He has a very protuberant abdomen.  It is kind of vaguely  tender in the lower abdomen but __________ bowel sounds are present.  __________ on pelvic exam today.  CNS:  Physiologic.   ADMISSION IMPRESSION:  1. Recurrent sigmoid diverticulitis.  2. A history of exogenous obesity.   PLAN:  The patient to undergo a cecal colectomy with anastomosis and  understands planned procedure and the potential complications.           ______________________________  Anselm Pancoast. Zachery Dakins, M.D.     WJW/MEDQ  D:  11/14/2007  T:  11/14/2007  Job:  [id number]

## 2010-07-28 NOTE — Assessment & Plan Note (Signed)
OFFICE VISIT   Jeff Johnson, Jeff Johnson  DOB:  1945/06/19                                        December 29, 2009  CHART #:  [id number]   ADDENDUM   Chest x-ray showed wires are intact and are not broken.   Doree Fudge, PA   DZ/MEDQ  D:  12/29/2009  T:  12/30/2009  Job:  [id number]

## 2010-07-28 NOTE — Assessment & Plan Note (Signed)
OFFICE VISIT   BURNIE, THERIEN  DOB:  06-May-1945                                        February 04, 2010  CHART #:  [id number]   CURRENT PROBLEMS:  1. Status post biologic-Bentall procedure for atrial insufficiency and      fusiform ascending aneurysm on December 11, 2009.  2. Postoperative orthostatic hypotension, dizziness, and falls.  3. Deconditioning.  4. Intolerance of BETA-BLOCKERS due to weakness.   PRESENT ILLNESS:  The patient returns for further surgical follow up  after undergoing the above procedure 2 months ago.  He started rehab  therapy as recommended, but he was having some orthostatic dizziness and  in fact has fallen due to the dizziness.  His shortness of breath is  improving as he has been able to walk more and improved his overall  conditioning.  He has had no orthopnea, PND, angina, and the surgical  incisions are healing well.  His 12.5 mg a day dose of Lopressor was  discontinued by Dr. Duanne Guess, and he has shown significant overall  improvement in his exercise tolerance and almost complete resolution of  his dizziness.  Apparently, a tilt table exam was scheduled for the  patient by the rehab facility when he had some orthostatic changes and  almost fell at rehab.  This has not yet been completed.  He is still  waiting to see Dr. Elease Hashimoto for postop follow up due to scheduling  difficulties with his office.  Overall, he has felt significantly better  over the past 7-10 days.   CURRENT MEDICATIONS:  Simvastatin, Prozac, Flomax, aspirin, Prilosec,  and p.r.n. Ativan for anxiety episodes.   SOCIAL HISTORY:  He is driving and starting rehab again so he can go  back to work.   PHYSICAL EXAMINATION:  Blood pressure 126/80, pulse 90, respirations 16,  saturation 99%.  He appears to be in an excellent mood.  His weight has  decreased from 255-222.  Breath sounds are clear and equal.  The sternum  is stable.  He has a slight flow murmur  through the aortic valve  prosthesis.  There is no diastolic murmur.  There is no peripheral  edema, and his peripheral pulses are intact.   PLAN:  The patient seems to be on the right track now and will follow up  with a tilt table which I feel was an appropriate exam.  I agree with  stopping his beta-blocker and have provided him with a 30-day  prescription of midodrine 10 mg p.o. q.a.m. for his orthostatic  symptoms.  I plan on seeing him back in approximately 4 weeks to discuss  return to work issues.  He works as a Electrical engineer.   Kerin Perna, M.D.  Electronically Signed   PV/MEDQ  D:  02/04/2010  T:  02/05/2010  Job:  [id number]   cc:   Maryelizabeth Rowan, M.D.  Vesta Mixer, M.D.

## 2010-07-28 NOTE — Op Note (Signed)
NAME:  Jeff Johnson, Jeff Johnson NO.:  [id number]   MEDICAL RECORD NO.:  [id number]          PATIENT TYPE:  INP   LOCATION:  0005                         FACILITY:  [facility]   PHYSICIAN:  Anselm Pancoast. Weatherly, M.D.DATE OF BIRTH:  Jul 18, 1945   DATE OF PROCEDURE:  11/14/2007  DATE OF DISCHARGE:                               OPERATIVE REPORT   PREOPERATIVE DIAGNOSIS:  1. Recurrent sigmoid diverticulitis.  2. Exogenous obesity.   OPERATION:  Sigmoid colectomy with mobilization of splenic flexure.   ANESTHESIA:  General anesthesia.   SURGEON:  Anselm Pancoast. Zachery Dakins, M.D.   ASSISTANT:  Dr. Claud Kelp.   HISTORY:  Jeff Johnson is a 65 year old overweight Caucasian male  followed by Dr. Artis Flock.  I first met in 2006 when he had problems with  gallbladder but was also having episodes of pain in the left lower  quadrant and at the time of the cholecystectomy could see where he was  having some diverticulosis in the proximal sigmoid colon.  He has been  on several rounds of antibiotics this past year and had a colonoscopy  approximately a year ago by Dr. Danise Edge, and because of the  recurrent symptoms desires to proceed on with a sigmoid colectomy.  The  bowel prep he said really made him nauseous feeling sick.  He also has  been on Cipro and Flagyl recently because of recurrent symptoms of  diverticulitis.  Laboratory studies preoperatively are unremarkable and  he was given 3 grams of Unasyn and permission obtained to proceed with  surgery.  The patient understands that we do a mobilization and where  the colon has evolved it may be necessary to mobilize splenic flexure  since it is kind of a proximal sigmoid diverticulitis and not so much of  low in the pelvis.   DESCRIPTION OF PROCEDURE:  The patient was taken to the operative suite.  General anesthesia endotracheal tube placed and NG tube was placed to  the stomach and the abdomen after a Foley catheter was inserted  was  prepped after shaving with non-iodoform solution.  He was then draped in  a sterile manner and a lower midline incision was made from umbilicus  down to the panniculus roll and we carefully entered into the peritoneal  cavity.  The sigmoid colon was kind of stuck down not really acutely  inflamed.  There was a couple little areas of more recent acute  inflammation and it is going to be necessary to mobilize the splenic  flexure to get mobility proximally to bring down to the mid transverse  and sigmoid colon.  We extended the incision first approximately 2 to 3  inches above the umbilicus took it up another 2 inches and used a  Economist.  The small bowel looks unremarkable.  There was  no evidence of free fluid or any type acute changes and with the  Buchwalter we could kind of pack off the small bowel for three laps was  used to hold that and then still it was difficult freeing up the splenic  flexure so we  could get some proximal mobility.  The area for the  resection after the mobilized the splenic flexure.  This was not  predominantly with cautery and the harmonic scalpel.  Then we could pick  the area and we removed probably about 6 inches descending colon maybe  more and the proximal half of the sigmoid colon and then all the obvious  diverticulosis area of diverticulitis was included in this.  The  retroperitoneal was kept very close to the bowel and we did not try to  dissect out the left ureter since this is benign disease.  I picked the  area for the resection and the distal was closed or clamped off with an  [employer] proximally and then the mesentery had been divided with  Harmonic scalpel close to the bowel and then the area for the proximal  transection was made and the ends fit together without tension with  splenic flexure mobilized.  I did a 3-0 silk knots everted single layer  closure and it comes together nicely and then the little mesenteric   defect was closed with some 3-0 silk sutures.  Since there was a little  more oozing than usual I did place a 19 Blake drain brought out through  the left lower quadrant and is laying lateral to the sigmoid colon kind  of brought up to the splenic area but we were not getting any active  bleeding and kind of serosanguineous fluid.  The small bowel was placed  in anatomical position.  I could bring the omentum down over it.  The NG  tube is in good position and then the posterior peritoneum and fascia  was closed with running 0 Vicryl and then a double looped #1 PDS was  used for the actual fascia closed with wide sutures and then the two  ends were tied together with two separate knots.  The wound was  irrigated and skin closed with staples.  The Blake drain had been hooked  to the reservoir to get serosanguineous fluid out and Foley catheter was  left in place with PAS stockings.  We will probably start him on Lovenox  tomorrow because of the difficulty of the surgery today.  I do not want  to give him Lovenox today but I expect he will need it during this  hospitalization.  I will hopefully be able to send him to the floor and  not have to go to step down and use PCA morphine for pain control.  Abdominal binder will be placed and he has been as heavy as 300 pounds  and he is about 250 at this time.           ______________________________  Anselm Pancoast. Zachery Dakins, M.D.     WJW/MEDQ  D:  11/14/2007  T:  11/15/2007  Job:  [id number]

## 2010-07-31 NOTE — Op Note (Signed)
   NAME:  Jeff Johnson, Jeff Johnson                           ACCOUNT NO.:  [id number]   MEDICAL RECORD NO.:  [id number]                   PATIENT TYPE:  AMB   LOCATION:  DSC                                  FACILITY:  [facility]   PHYSICIAN:  Lowell Bouton, M.D.      DATE OF BIRTH:  11-Feb-1946   DATE OF PROCEDURE:  02/22/2002  DATE OF DISCHARGE:                                 OPERATIVE REPORT   PREOPERATIVE DIAGNOSIS:  Right carpal tunnel syndrome.   POSTOPERATIVE DIAGNOSIS:  Right carpal tunnel syndrome.   PROCEDURE:  Decompression of the median nerve, right carpal tunnel.   SURGEON:  Lowell Bouton, M.D.   ANESTHESIA:  0.5% Marcaine local with sedation.   OPERATIVE FINDINGS:  The patient had a very narrow carpal canal.  There were  no masses identified.   DESCRIPTION OF PROCEDURE:  Under 0.5% Marcaine local anesthesia with a  tourniquet on the right arm, the right hand was prepped and draped in the  usual fashion and after exsanguinating the limb, the tourniquet was inflated  to 250 mmHg.  A 3 cm longitudinal incision was made just ulnar to the thenar  crease in the palm.  Sharp dissection was carried through the subcutaneous  tissues, and bleeding points were coagulated.  Blunt dissection was carried  through the superficial palmar fascia, which was quite thick.  A hemostat  was placed in the carpal canal up against the hook of the hamate, and the  transverse carpal ligament was divided on the ulnar border of the median  nerve.  The proximal end of the ligament was divided with the scissors after  dissecting the nerve away from the undersurface of the ligament.  The nerve  was then examined and an external epineurolysis was performed.  The wound  was irrigated with saline and closed with 4-0 nylon sutures.  Sterile  dressings were applied, followed by a volar wrist splint.  The patient  tolerated the procedure well and went to the recovery room awake and stable,  in  good condition.                                               Lowell Bouton, M.D.    EMM/MEDQ  D:  02/22/2002  T:  02/22/2002  Job:  [id number]

## 2010-07-31 NOTE — Discharge Summary (Signed)
NAME:  Jeff Johnson, Jeff Johnson NO.:  [id number]   MEDICAL RECORD NO.:  [id number]          PATIENT TYPE:  INP   LOCATION:  1315                         FACILITY:  [facility]   PHYSICIAN:  Anselm Pancoast. Weatherly, M.D.DATE OF BIRTH:  [birth year]   DATE OF ADMISSION:  11/14/2007  DATE OF DISCHARGE:  11/23/2007                               DISCHARGE SUMMARY   DISCHARGE DIAGNOSES:  1. Recurrent sigmoid diverticulitis with stricture.  2. History of exogenous obesity.   SURGERY:  Sigmoid colectomy with mobilization of the splenic flexure  within the end anastomosis.   HISTORY:  Jeff Johnson is a 65 year old Caucasian male whom I first met  approximately 4 years ago when he had problems with his gallbladder and  gave a history of previous problems with diverticulitis.  We elected to  proceed on with a laparoscopic cholecystectomy and at the time you could  see an inflammatory process in the proximal sigmoid colon.  With medical  management, he has done satisfactory with the diverticulitis but he has  had recurrent episodes of diverticulitis over the last year and has been  on and off Cipro and Flagyl several times.  He had a colonoscopy by Dr.  Reece Agar approximately a year ago that showed diverticulosis and  diverticulitis, and I saw him in the office in July.  He had a recent CT  that showed no evidence of any acute sigmoid diverticulitis, but it  showed a lot of diverticular changes within the left colon.  Because of  the recurrent episodes, he wanted to proceed on with a sigmoid  colectomy.  His last bout of oral antibiotics for the diverticulitis was  approximately 3 weeks earlier.  The patient has mildly elevated glucose.  He has nitroglycerin but has not used it in approximately 2 years and  works as a Engineer, materials.  The patient was prepped in preparation for  the surgery with GoLYTELY, erythromycin, and neomycin and reported that  he got quite nauseated with this bowel  prep.  He was admitted on the  morning of surgery.  His intravenous fluids were given since he had a  difficult time with the bowel prep, and was done through a midline  incision.  He had marked inflammation and thickening of the descending  colon and sigmoid colon and because of his obesity, it was necessary to  completely mobilize the splenic flexure, and we took the proximal area  for the anastomosis just below the splenic flexure and was taken down to  the low portion of the sigmoid colon.  I could, with the colon being  mobilized, do an end-to-end anastomosis, and postop he had a nasogastric  tube.  Dr. Derrell Lolling assisted on his surgery and followed him for at least  half of his hospitalized stay.  He was bloated for about 3 days and then  started passing flatus, and then bowel function started working nicely.   LABORATORY STUDIES:  During the postoperative period showed a hemoglobin  that was stable.  It was 13.9.  Originally it was down to 12.  At the  time of discharge his white count was 9400.  He was discharged with his  staples still in place and I will remove them in the office.  The path  report showed sigmoid diverticulitis, acute and  chronic, and it appeared that he had a stricture at the proximal sigmoid  colon area causing his marked symptoms of bloating, cramping, et Karie Soda.  There was no evidence of any malignancy and there were multiple  diverticula that showed varying degrees of inflammation.  There was no  frank abscess appreciated in the path report.           ______________________________  Anselm Pancoast. Zachery Dakins, M.D.     WJW/MEDQ  D:  12/26/2007  T:  12/26/2007  Job:  [id number]   cc:   Quita Skye. Artis Flock, M.D.  Fax: [phone]

## 2010-07-31 NOTE — Op Note (Signed)
NAME:  Jeff, Johnson NO.:  [id number]   MEDICAL RECORD NO.:  [id number]          PATIENT TYPE:  OIB   LOCATION:  1610                         FACILITY:  [facility]   PHYSICIAN:  Jeff Pancoast. Johnson, M.D.DATE OF BIRTH:  1945/09/15   DATE OF PROCEDURE:  01/29/2005  DATE OF DISCHARGE:                                 OPERATIVE REPORT   PREOPERATIVE DIAGNOSIS:  Chronic cholecystitis with stones.   POSTOPERATIVE DIAGNOSIS:  Chronic cholecystitis with stones.   OPERATION:  Laparoscopic cholecystectomy with cholangiogram.   SURGEON:  Dr. Consuello Johnson   ASSISTANT:  Dr. Marcille Johnson   HISTORY:  Jeff Johnson is a 65 year old Caucasian male, whom I first met  approximately 3 months ago when I admitted him with recurrent sigmoid  diverticulitis.  A CT at that time really did not show any significant  diverticulitis, and he had had an episode of pain earlier in the summer and  then one in July that had just treated with antibiotics.  The patient's  symptoms subsided fairly promptly, and he was discharged on oral  antibiotics.  He did have gallstones, and the question was whether or not  this episode of pain in July had actually been biliary in origin.  I  recommended that we (1) do a full column barium enema in approximately a  month.  This was performed and really did not show any evidence of any  significant diverticulitis or stricture.  He had a few tics but really not  like that of somebody who had had recurrent diverticulosis.  I recommended  that we proceed with laparoscopic cholecystectomy and remove his gallbladder  and do a cholangiogram, and then we would look at the sigmoid colon, and he  is here for that planned procedure today.  His regular physician is Dr.  Artis Johnson, and the patient has been seen by Dr. Karma Johnson for the problems.   Preoperatively he was given 3 g of Unasyn and taken to the operating suite.  He has PAS stockings.  He is large, and the abdomen  was first shaved around  the port sites and then prepped with Betadine solution and draped in a  sterile manner.  A small incision was made below the umbilicus; the fascia  was identified, and this was picked up between Kochers and then carefully  entered into the peritoneal cavity.  A pursestring suture of 0 Vicryl was  placed and the Hasson cannula introduced.  The gallbladder was distended but  not acutely inflamed.  The upper 10 mm trocar was placed in the subxiphoid  area, and the 2 lateral 5 mm trocars were placed at the appropriate position  after anesthetizing the fascia by Dr. Marcille Johnson.  The gallbladder was  grasped upward and outward.  The peritoneum was opened proximally.  The  cystic duct and cystic artery were both dissected out then clipped.  Two  clips proximal on the cystic artery, 1 distally, and then a clip at the  junction of the gallbladder and cystic duct and then a small opening made  with the [facility] catheter  in place, and the cholangiogram with the contrast was  performed.  The x-ray was normal.  The catheter was removed.  The cystic  duct was triply clipped and then divided, and then the gallbladder was freed  from its subhepatic fossa with electrocautery and placed in the EndoCatch  bag.  There was a little bit of bile spillage, and that was aspirated, and  then the gallbladder in the EndoCatch bag was brought through the umbilicus.  Before removing the gallbladder, we had looked in the lower abdomen and took  pictures, and it appears that he has had an episode or 2 of sigmoid  diverticulitis, and there was a little area right at the proximal sigmoid  colon that would be consistent with previous acute diverticulitis.  Pictures  were taken of this for the patient's reference.  The fascia at the umbilicus  was closed with additional figure-of-eight of 0 Vicryl plus the pursestring  suture and then this fascia anesthetized.  The subcutaneous wounds were  closed with  4-0 Vicryl.  Benzoin and Steri-Strips were placed on the skin.  The patient tolerated the procedure nicely and should be ready for discharge  in the a.m.           ______________________________  Jeff Pancoast. Jeff Johnson, M.D.     WJW/MEDQ  D:  01/29/2005  T:  01/29/2005  Job:  [id number]   cc:   Jeff Johnson. Jeff Johnson, M.D.  Fax: [phone]

## 2010-07-31 NOTE — H&P (Signed)
NAME:  Jeff Johnson NO.:  [id number]   MEDICAL RECORD NO.:  [id number]          PATIENT TYPE:  INP   LOCATION:  0102                         FACILITY:  [facility]   PHYSICIAN:  Anselm Pancoast. Weatherly, M.D.DATE OF BIRTH:  03-10-1946   DATE OF ADMISSION:  11/26/2004  DATE OF DISCHARGE:                                HISTORY & PHYSICAL   CHIEF COMPLAINT:  Severe left lower abdominal pain, history of previous  diverticulitis.   HISTORY:  Jeff Johnson is a 65 year old Caucasian male who was referred over  to the ER by Dr. Luanna Salk for management of recurring left lower quadrant  abdominal pain.  The patient states that previously he used to be a Physicist, medical.  He has had problems with back problems.  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  In June, he had an episode  of left lower quadrant and was treated by Dr. Artis Flock with oral antibiotics.  He did have a CT that showed diverticulitis of the sigmoid colon, kind of  proximal colon.  In July, he had a second episode of diverticulitis, again  treated with antibiotics.   This past week, on Monday, he started having similar pain.  He saw Dr.  Margrett Rud and was placed on Cipro.  Today, because of increasing pain,  he was seen by Dr. Artis Flock, who found him to have marked tenderness in the  left lower quadrant and called me concerned about whether or not the patient  possibly had an abdominal perforation because of the increase in the  abdominal pain.  Patient was sent over to the [facility] emergency room,  where I saw him shortly after he arrived.  He definitely was tender in the  left lower quadrant but is not that of a rigid abdomen.  I sent him over an  acute abdominal series.  These showed kind of a gaseous abdomen, some stool  within the more proximal colon.  A loop of small bowel dilatation but not  that of an obstruction but certainly no evidence of any free air.  We then  had a  CAT scan done with oral contrast.  The patient is allergic to the IVP  DYE, and this shows worsening diverticulitis but no abscess and no  perforation.  The patient was given a little bit of IV narcotics, and his  laboratory studies do show a mildly elevated white count of 11,800.  He does  have a temperature of about 100.8, and I think because of the worsening pain  on oral antibiotics, he definitely needs to be admitted.  I think that with  this being a third episode of symptoms with two previous treatments, even  though I do not have the details of that white count and how ill he was,  that consideration for possibly an urgent colectomy should be entertained.  The patient has never had a colonoscopy or barium enema to confirm, only  diverticulosis or diverticulitis.   As far as past history, he has had back surgery by Dr. Hope Pigeon  in 1986.  He is on medication for blood pressure and also I think Zoloft.  Patient's  wife was with him originally, and she has gone home.   PAST SURGICAL HISTORY:  He has had carpal tunnel releases and the back  surgery by Dr. Hope Pigeon.   ALLERGIES:  He is significantly allergic to IVP DYE and also SHELLFISH.   He states that he has also got a little problem with difficulty voiding for  his prostate enlargement.   PHYSICAL EXAMINATION:  VITAL SIGNS:  He weighs 225 pounds.  He is quite  tall.  Not really a fat but a large individual.  Temperature 100.4, blood  pressure 106/69, pulse in the 70s, 74 and 79, respectively.  Respirations  about 18.  HEENT:  Appears adequately hydrated.  No cervical or supraclavicular  lymphadenopathy.  LUNGS:  Good breath sounds bilaterally.  CARDIAC:  Normal sinus rhythm.  ABDOMEN:  He has  few bowel sounds.  He is definitely more tender in the  left lower quadrant.  It is not generalized abdominal tenderness.  On  pressure in the right lower quadrant, it makes the pain increase in the left  lower quadrant.  His psoas  and obturator are essentially unremarkable.  RECTAL:  Stool hemoccult negative.  EXTREMITIES:  Unremarkable.  CNS:  Physiologic.   ADMISSION IMPRESSION:  Recurrent sigmoid diverticulitis, not improving with  oral Cipro.   PLAN:  Patient will be admitted.  Kept n.p.o.  Intravenous fluids.  Will  reassess him in the morning.  It does not appear from his CT that he has any  type of perforation or abscess, and the consideration of whether or not they  could go ahead and do a bowel preparation, whether or not he could end up  undergoing a sigmoid colectomy and no colostomy step.  I need to entertain  whether or not we want to do a Gastrografin enema or possibly a colonoscopy,  according to how the patient is doing.  The CAT scan certainly does not look  like there is anything consistent with or worrisome of a tumor, but of  course, the CAT scan fails to show a colon lesion, but it does show signs of  acute sigmoid diverticulitis.           ______________________________  Anselm Pancoast. Zachery Dakins, M.D.     WJW/MEDQ  D:  11/26/2004  T:  11/26/2004  Job:  [id number]

## 2010-07-31 NOTE — Discharge Summary (Signed)
Jeff Johnson, Jeff Johnson NO.:  [id number]   MEDICAL RECORD NO.:  [id number]          PATIENT TYPE:  INP   LOCATION:  1613                         FACILITY:  [facility]   PHYSICIAN:  Anselm Pancoast. Weatherly, M.D.DATE OF BIRTH:  [birth year]   DATE OF ADMISSION:  11/26/2004  DATE OF DISCHARGE:  12/02/2004                                 DISCHARGE SUMMARY   DISCHARGE DIAGNOSES:  1.  Acute sigmoid diverticulitis.  2.  Gallstones, possible passed common duct stone.  3.  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   OPERATION:  None.   HISTORY:  Jeff Johnson is a 65 year old Caucasian male who was referred over  to the ER where I saw him on September 14 with reoccurring episodes of left  lower quadrant thought to be diverticulitis. The patient back in June 2006  had a first episode of this symptom. He saw his regular doctor, Dr. Bradd Canary, who diagnosed acute sigmoid diverticulitis and placed him on Cipro  and Flagyl. The patient was treated with the antibiotics. He did have a CAT  scan that showed changes consistent with diverticulitis of the sigmoid colon  and it was also noted that he had gallstones. The patient's liver function  studies were normal. His bilirubin was about 2.4 and he was treated and did  improve. However, in June, he had a second episode of pain and this time was  treated with a second round of antibiotics prescribed by Dr. Margrett Rud.  The patient was not actually seen at that time and the prescriptions were  called in according to Dr. Blair Heys notes. The patient, approximately 2 weeks  or a week prior to this admission, started again having these episodes of  pain and the antibiotics were again started. Then he saw Dr. Artis Flock who found  him significantly more tender in the left lower quadrant and sent him over  thinking that this was probably and abscess or possibly a perforation of the  sigmoid diverticulitis. I saw  him in the emergency room and was not as  impressed that he was definitely as toxic as it had been described and  obtained a CAT scan. The CAT scan did show a progression of the  diverticulitis and his white count on this admission was about 12,000. I put  him on Zosyn intravenously and his bilirubin on admission was 5.7. He is  allergic to the IVP DYE and no IV contrast had been given to the patient for  this reason, so as far as actually being able to definitely distinguish the  common bile duct and etc. did not appear to have any enlargement of the  common bile duct. The patient gradually improved over the next couple of  days, Dr. Ezzard Standing and Luan Pulling on over the weekend. It was noted that the  patient was clinically mildly jaundiced and then a followup bilirubin was  obtained approximately 3 days later and the bilirubin now was down to 2.4.  Dr. Artis Flock had noted that the patient has had mildly  elevated bilirubin's in  the past. His SGOT, SGPT and alkaline phosphatase have never been elevated.  The patient is now improved, he is continuing on the antibiotics, still very  mild lower abdominal tenderness. I have not seen combination before and  wonder if this mechanical back problem that is a chronic problem and also  the patient's diverticulitis could possibly masquerade in that maybe he is  passing a small common duct stone that is causing some of the symptoms.  Since he has also never had a colonoscopy or a barium enema and he is  clinically improved, I think it would be best to complete a round of  antibiotics, follow him closely in the office and then obtain a barium enema  and then coordinate whether he is going to need elective sigmoid resection  and a cholecystectomy or possibly just a cholecystectomy and a  cholangiogram. The patient and Dr. Artis Flock are in agreement with his plan and  he is released at this time. He is on blood pressure medication for mild  controlled hypertension  and he is treated with ampicillin 500 mg q.i.d. for  10 days and also Flagyl 500 mg q.i.d. He already has Vicodin for the pain.  He understands that if he is having increasing pain, he will definitely call  to be seen in the office or if he has a temperature of 101. He is on  basically a full liquid diet now, he  has a bowel movement. His hemoglobin and hematocrit has always been normal  and I think the possibility of this being any type of colon cancer is very  unlikely. The barium enema should distinguish all of this and then we will  go accordingly.           ______________________________  Anselm Pancoast. Zachery Dakins, M.D.     WJW/MEDQ  D:  12/02/2004  T:  12/03/2004  Job:  [id number]   cc:   Quita Skye. Artis Flock, M.D.  [address]  [place]  [place] [address]  Fax: [phone]

## 2010-08-26 ENCOUNTER — Telehealth: Payer: Self-pay | Admitting: Internal Medicine

## 2010-08-26 NOTE — Telephone Encounter (Signed)
Pt saw Dr. Ladona Ridgel the other night and he is wobblie and Dr. Ladona Ridgel told him to come into the office to get some pills. I told him I would send a message to the nurse so she can find out what he needs and call him back

## 2010-08-26 NOTE — Telephone Encounter (Signed)
Returned phone call to patient.  He was locking the building and got dizzy  Dr Ladona Ridgel saw him on Friday and he wants to know what the "pill" is that Dr Ladona Ridgel is talking about

## 2010-08-27 NOTE — Telephone Encounter (Signed)
Called patient back and will let him discuss with Dr Maren Beach

## 2010-08-27 NOTE — Telephone Encounter (Signed)
Pt called back and wants to cancel the call, he meant to call dr Zenaida Niece tright not dr Ladona Ridgel

## 2010-09-11 ENCOUNTER — Telehealth: Payer: Self-pay | Admitting: Internal Medicine

## 2010-09-11 ENCOUNTER — Encounter: Payer: Self-pay | Admitting: *Deleted

## 2010-09-11 NOTE — Telephone Encounter (Signed)
Spoke with Dr Ladona Ridgel ok for him to return to work  Pt will pick up letter on Mon

## 2010-09-11 NOTE — Telephone Encounter (Signed)
Pt needs a letter to rtn to work full time he is feeling better and wants to go back full time

## 2010-12-14 ENCOUNTER — Other Ambulatory Visit: Payer: Self-pay | Admitting: Cardiothoracic Surgery

## 2010-12-14 DIAGNOSIS — I712 Thoracic aortic aneurysm, without rupture: Secondary | ICD-10-CM

## 2010-12-16 LAB — BASIC METABOLIC PANEL
BUN: 9
CO2: 26
CO2: 30
Calcium: 8.5
Calcium: 8.5
GFR calc Af Amer: >60
GFR calc non Af Amer: >60
GFR calc non Af Amer: >60
Glucose, Bld: 159 — ABNORMAL HIGH
Glucose, Bld: 98
Potassium: 4.3
Potassium: 4.5
Sodium: 133 — ABNORMAL LOW
Sodium: 134 — ABNORMAL LOW
Sodium: 135

## 2010-12-16 LAB — POCT I-STAT, CHEM 8
BUN: 12
Creatinine, Ser: 0.9
Glucose, Bld: 147 — ABNORMAL HIGH
Sodium: 135
TCO2: 23

## 2010-12-16 LAB — CBC
HCT: 35.5 — ABNORMAL LOW
HCT: 41.4
Hemoglobin: 12 — ABNORMAL LOW
Hemoglobin: 13.9
Hemoglobin: 14.8
MCHC: 33.5
MCHC: 33.7
RBC: 4.81
RDW: 13.1
RDW: 13.5
RDW: 13.9
RDW: 13.9
WBC: 11.6 — ABNORMAL HIGH

## 2010-12-22 ENCOUNTER — Encounter: Payer: Self-pay | Admitting: *Deleted

## 2010-12-23 ENCOUNTER — Encounter: Payer: Self-pay | Admitting: Cardiothoracic Surgery

## 2010-12-23 ENCOUNTER — Ambulatory Visit (INDEPENDENT_AMBULATORY_CARE_PROVIDER_SITE_OTHER): Payer: Medicare Other | Admitting: Cardiothoracic Surgery

## 2010-12-23 ENCOUNTER — Ambulatory Visit
Admission: RE | Admit: 2010-12-23 | Discharge: 2010-12-23 | Disposition: A | Discharge status: Home / Self Care | Payer: Medicare Other | Source: Ambulatory Visit | Attending: Cardiothoracic Surgery | Admitting: Cardiothoracic Surgery

## 2010-12-23 VITALS — BP 110/74 | HR 72 | Resp 18 | Ht 72.0 in | Wt 258.0 lb

## 2010-12-23 DIAGNOSIS — I712 Thoracic aortic aneurysm, without rupture: Secondary | ICD-10-CM

## 2010-12-23 DIAGNOSIS — Z9889 Other specified postprocedural states: Secondary | ICD-10-CM

## 2010-12-23 DIAGNOSIS — I359 Nonrheumatic aortic valve disorder, unspecified: Secondary | ICD-10-CM

## 2010-12-23 LAB — BASIC METABOLIC PANEL
BUN: 7
CO2: 28
Calcium: 8.9
Chloride: 105
Creatinine, Ser: 0.83
GFR calc Af Amer: >60
GFR calc non Af Amer: >60
Glucose, Bld: 103 — ABNORMAL HIGH
Potassium: 4.2
Sodium: 138

## 2010-12-23 LAB — POCT HEMOGLOBIN-HEMACUE: Hemoglobin: 14

## 2010-12-23 NOTE — Patient Instructions (Signed)
Decrease aspirin to 81 mg daily for the aortic valve due to blood in the urine.

## 2010-12-23 NOTE — Progress Notes (Signed)
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 He is doing well. He is working part-time as a Electrical engineer. He has difficulties with orthostatic hypotension now improved since stopping Vasotec. 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 He is only taking aspirin for the prosthetic valve 325 mg daily. He denies angina or symptoms of CHF. Current Outpatient Prescriptions  Medication Sig Dispense Refill  . aspirin 325 MG EC tablet Take 325 mg by mouth daily.        . Dutasteride-Tamsulosin HCl 0.5-0.4 MG CAPS Take 1 capsule by mouth daily.        Marland Kitchen FLUoxetine (PROZAC) 20 MG capsule Take 20 mg by mouth daily.        . lansoprazole (PREVACID) 30 MG capsule Take 30 mg by mouth daily.        . simvastatin (ZOCOR) 20 MG tablet Take 20 mg by mouth at bedtime.        . enalapril (VASOTEC) 10 MG tablet Take 10 mg by mouth daily.           Review of Systems: No fever. He has gained 8 pounds. He uses a little bit of salt due to some orthostatic hypotension. No active dental complaints. No edema. He still sees Dr. Gilman Schmidt do do some atrial arrhythmia.   Physical Exam Blood pressure 110/74 pulse 72 and regular saturation 97% on room air weight 258 pounds. He is a middle-aged Caucasian male in no acute distress alert and oriented. HEENT exam is normocephalic normal carotid pulses. Sternal incision is well-healed. No cardiac murmur rub or gallop.   thorax is without tenderness. Breath sounds are clear and equal.   Diagnostic Tests: CT scan of the chest without contrast due to to IV contrast allergy shows an intact a sending aorta and aortic root repair. The remainder of the arch and descending thoracic aorta are of normal size. No  significant pulmonary findings.    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   Plan: I told Mr. Vinje he could reduce his aspirin 81 mg a day for the valve if he continues to have significant problems with hematuria from the prostate radiation for cancer. He'll return in one year for CT scan followup.

## 2011-01-14 ENCOUNTER — Encounter: Payer: Self-pay | Admitting: Internal Medicine

## 2011-01-14 ENCOUNTER — Ambulatory Visit (INDEPENDENT_AMBULATORY_CARE_PROVIDER_SITE_OTHER): Payer: Medicare Other | Admitting: Internal Medicine

## 2011-01-14 DIAGNOSIS — R42 Dizziness and giddiness: Secondary | ICD-10-CM

## 2011-01-14 DIAGNOSIS — R55 Syncope and collapse: Secondary | ICD-10-CM

## 2011-01-14 NOTE — Assessment & Plan Note (Signed)
Today I recommended that the patient increase his physical activity. I have asked that he exercise by walking every day. He is fairly reluctant in this regard and I have instructed him on the importance of regular daily exercise. The patient has previously been followed by Dr. Elease Hashimoto or in our practice and I will turn him back to him for additional followup.

## 2011-01-14 NOTE — Patient Instructions (Signed)
Your physician wants you to follow-up in: 6 months with Dr. Nahser. You will receive a reminder letter in the mail two months in advance. If you don't receive a letter, please call our office to schedule the follow-up appointment.  

## 2011-01-14 NOTE — Progress Notes (Signed)
HPI Jeff Johnson returns today for followup. He is a pleasant 65 year old man who has become quite sedentary. He is status post thoracic aneurysm repair by Dr. Morton Peters. I was asked to see the patient several months ago initially for syncope. His symptoms appear to be due to orthostasis. These have improved. He does note that when he bends over and stands back up he will get lightheaded and short of breath. He notes that his blood pressure has been on the low side. He is now off of his blood pressure medications. His wife who is with him today notes that he is very sedentary. He is now walking at all. He denies peripheral edema. Allergies  Allergen Reactions  . Iodinated Diagnostic Agents Anaphylaxis     Current Outpatient Prescriptions  Medication Sig Dispense Refill  . aspirin 325 MG EC tablet Take 325 mg by mouth daily.        . Dutasteride-Tamsulosin HCl 0.5-0.4 MG CAPS Take 1 capsule by mouth daily.        . enalapril (VASOTEC) 10 MG tablet Take 10 mg by mouth daily.        Marland Kitchen FLUoxetine (PROZAC) 20 MG capsule Take 20 mg by mouth daily.        . lansoprazole (PREVACID) 30 MG capsule Take 30 mg by mouth daily.        . simvastatin (ZOCOR) 20 MG tablet Take 20 mg by mouth at bedtime.           Past Medical History  Diagnosis Date  . Hypotension, unspecified   . Dizziness and giddiness   . Heart murmur   . Aortic stenosis 12/11/09  . Aortic aneurysm 12/11/09    ROS:   All systems reviewed and negative except as noted in the HPI.   Past Surgical History  Procedure Date  . Tissue aortic valve replacement 12/11/09  . Thoracic aortic aneurysm repair 12/11/09    ASCENDING THORACIC AORTIC ANEURYSM REPAIR  . 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 Cholecystectomy 2010  . Colon surgery 2010    colon resection  . Doppler carotid 2012  . Doppler echocardiography 2011     Family History  Problem Relation Age of Onset  . Heart disease Father 28  . Stroke Mother 65       History   Social History  . Marital Status: Married    Spouse Name: N/A    Number of Children: N/A  . Years of Education: N/A   Occupational History  . Not on file.   Social History Main Topics  . Smoking status: Former Smoker    Types: Cigarettes    Quit date: 03/16/1967  . Smokeless tobacco: Not on file  . Alcohol Use: No  . Drug Use: Not on file  . Sexually Active: Not on file   Other Topics Concern  . Not on file   Social History Narrative  . No narrative on file     BP 128/72  Pulse 80  Ht 6' (1.829 m)  Wt 239 lb (108.41 kg)  BMI 32.41 kg/m2  Physical Exam:  Well appearing obese, middle-aged man, NAD HEENT: Unremarkable Neck:  No JVD, no thyromegally Lymphatics:  No adenopathy Back:  No CVA tenderness Lungs:  Clear with no wheezes, rales, or rhonchi. HEART:  Regular rate rhythm, no murmurs, no rubs, no clicks Abd:  soft, positive bowel sounds, no organomegally, no rebound, no guarding Ext:  2 plus pulses, no edema, no  cyanosis, no clubbing Skin:  No rashes no nodules Neuro:  CN II through XII intact, motor grossly intact  Assess/Plan:

## 2011-01-14 NOTE — Assessment & Plan Note (Signed)
His symptoms now appear to be controlled. He will continue his current medical therapy.

## 2011-03-26 DIAGNOSIS — E785 Hyperlipidemia, unspecified: Secondary | ICD-10-CM | POA: Diagnosis not present

## 2011-03-26 DIAGNOSIS — I10 Essential (primary) hypertension: Secondary | ICD-10-CM | POA: Diagnosis not present

## 2011-03-26 DIAGNOSIS — F329 Major depressive disorder, single episode, unspecified: Secondary | ICD-10-CM | POA: Diagnosis not present

## 2011-03-26 DIAGNOSIS — M19019 Primary osteoarthritis, unspecified shoulder: Secondary | ICD-10-CM | POA: Diagnosis not present

## 2011-04-07 DIAGNOSIS — M25519 Pain in unspecified shoulder: Secondary | ICD-10-CM | POA: Diagnosis not present

## 2011-05-18 ENCOUNTER — Other Ambulatory Visit: Payer: Self-pay | Admitting: Orthopedic Surgery

## 2011-05-18 ENCOUNTER — Telehealth: Payer: Self-pay | Admitting: Cardiovascular Disease

## 2011-05-18 DIAGNOSIS — M25512 Pain in left shoulder: Secondary | ICD-10-CM

## 2011-05-18 DIAGNOSIS — E785 Hyperlipidemia, unspecified: Secondary | ICD-10-CM

## 2011-05-18 NOTE — Telephone Encounter (Signed)
Please return call to patient [phone]   Does patient need fasting labs for 5/2 ROV? Patient would like return call

## 2011-05-18 NOTE — Telephone Encounter (Signed)
ORDERS PLACED FOR LABS FOR 07/15/11 APP, PT AWARE TO BE FASTING.

## 2011-05-20 ENCOUNTER — Ambulatory Visit
Admission: RE | Admit: 2011-05-20 | Discharge: 2011-05-20 | Disposition: A | Discharge status: Home / Self Care | Payer: Medicare Other | Source: Ambulatory Visit | Attending: Orthopedic Surgery | Admitting: Orthopedic Surgery

## 2011-05-20 DIAGNOSIS — M25519 Pain in unspecified shoulder: Secondary | ICD-10-CM | POA: Diagnosis not present

## 2011-05-20 DIAGNOSIS — M7520 Bicipital tendinitis, unspecified shoulder: Secondary | ICD-10-CM | POA: Diagnosis not present

## 2011-05-20 DIAGNOSIS — M25512 Pain in left shoulder: Secondary | ICD-10-CM

## 2011-05-20 DIAGNOSIS — S46819A Strain of other muscles, fascia and tendons at shoulder and upper arm level, unspecified arm, initial encounter: Secondary | ICD-10-CM | POA: Diagnosis not present

## 2011-07-06 DIAGNOSIS — L738 Other specified follicular disorders: Secondary | ICD-10-CM | POA: Diagnosis not present

## 2011-07-06 DIAGNOSIS — L259 Unspecified contact dermatitis, unspecified cause: Secondary | ICD-10-CM | POA: Diagnosis not present

## 2011-07-06 DIAGNOSIS — L821 Other seborrheic keratosis: Secondary | ICD-10-CM | POA: Diagnosis not present

## 2011-07-13 ENCOUNTER — Other Ambulatory Visit (INDEPENDENT_AMBULATORY_CARE_PROVIDER_SITE_OTHER): Payer: Medicare Other

## 2011-07-13 DIAGNOSIS — E785 Hyperlipidemia, unspecified: Secondary | ICD-10-CM

## 2011-07-13 LAB — LIPID PANEL
LDL Cholesterol: 71 mg/dL (ref 0–99)
Total CHOL/HDL Ratio: 3
VLDL: 32.8 mg/dL (ref 0.0–40.0)

## 2011-07-13 LAB — BASIC METABOLIC PANEL
GFR: 76.33 mL/min (ref >60.00)
Potassium: 4.4 mEq/L (ref 3.5–5.1)
Sodium: 140 mEq/L (ref 135–145)

## 2011-07-13 LAB — HEPATIC FUNCTION PANEL
AST: 21 U/L (ref 0–37)
Albumin: 4.2 g/dL (ref 3.5–5.2)
Alkaline Phosphatase: 49 U/L (ref 39–117)
Bilirubin, Direct: 0.2 mg/dL (ref 0.0–0.3)
Total Bilirubin: 2.1 mg/dL — ABNORMAL HIGH (ref 0.3–1.2)

## 2011-07-15 ENCOUNTER — Ambulatory Visit (INDEPENDENT_AMBULATORY_CARE_PROVIDER_SITE_OTHER): Payer: Medicare Other | Admitting: Cardiovascular Disease

## 2011-07-15 ENCOUNTER — Encounter: Payer: Self-pay | Admitting: Cardiovascular Disease

## 2011-07-15 VITALS — BP 126/79 | HR 63 | Ht 72.0 in | Wt 242.8 lb

## 2011-07-15 DIAGNOSIS — Z9889 Other specified postprocedural states: Secondary | ICD-10-CM

## 2011-07-15 DIAGNOSIS — R42 Dizziness and giddiness: Secondary | ICD-10-CM | POA: Diagnosis not present

## 2011-07-15 DIAGNOSIS — I359 Nonrheumatic aortic valve disorder, unspecified: Secondary | ICD-10-CM | POA: Diagnosis not present

## 2011-07-15 DIAGNOSIS — I719 Aortic aneurysm of unspecified site, without rupture: Secondary | ICD-10-CM | POA: Diagnosis not present

## 2011-07-15 DIAGNOSIS — I350 Nonrheumatic aortic (valve) stenosis: Secondary | ICD-10-CM

## 2011-07-15 DIAGNOSIS — I959 Hypotension, unspecified: Secondary | ICD-10-CM

## 2011-07-15 NOTE — Progress Notes (Signed)
Luci Bank Date of Birth  01-10-1946 [facility]     [place] Office  [address]    [address]   [address] [place], [place]  [address]    [place], [place]  [address] [phone]  Fax  [phone]  [phone]  Fax [phone]  Problem list: 1. 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 History of colon cystitis 3. Hyperlipidemia 4. Dizziness - ? orthostasis.  Negative tilt table study.  30 day monitor negative.  Occurs with standing or sitting  History of Present Illness:  66 yo with hx of aortic valve disease - s/p Bentall procedure.    He's been having some problems with dizziness. He has seen Dr. Sharrell Ku. He's had a 30 day event monitor which was negative. He had a tilt table study which was negative. He has had episodes of dizziness while standing and with sitting. He usually has several seconds of warning. If he is driving his car he was able to pull over to the side of the road.  He complains of generalized fatigue.  He does not sleep very well.   He has had prostate cancer and has to go to the bathroom at least 3 times a night.  Current Outpatient Prescriptions on File Prior to Visit  Medication Sig Dispense Refill  . FLUoxetine (PROZAC) 20 MG capsule Take 20 mg by mouth daily.       . lansoprazole (PREVACID) 30 MG capsule Take 30 mg by mouth daily.        . simvastatin (ZOCOR) 20 MG tablet Take 20 mg by mouth at bedtime.          Allergies  Allergen Reactions  . Iodinated Diagnostic Agents Anaphylaxis    Past Medical History  Diagnosis Date  . Hypotension, unspecified   . Dizziness and giddiness   . Heart murmur   . Aortic stenosis 12/11/09  . Aortic aneurysm 12/11/09    Past Surgical History  Procedure Date  . Tissue aortic valve replacement 12/11/09  . Thoracic aortic aneurysm repair 12/11/09    ASCENDING THORACIC AORTIC ANEURYSM REPAIR  . 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  Cholecystectomy 2010  . Colon surgery 2010    colon resection  . Doppler carotid 2012  . Doppler echocardiography 2011    History  Smoking status  . Former Smoker  . Types: Cigarettes  . Quit date: 03/16/1967  Smokeless tobacco  . Not on file    History  Alcohol Use No    Family History  Problem Relation Age of Onset  . Heart disease Father 74  . Stroke Mother 53    Reviw of Systems:  Reviewed in the HPI.  All other systems are negative.  Physical Exam: Blood pressure 126/79, pulse 63, height 6' (1.829 m), weight 242 lb 12.8 oz (110.133 kg). General: Well developed, well nourished, in no acute distress.  Head: Normocephalic, atraumatic, sclera non-icteric, mucus membranes are moist,   Neck: Supple. Carotids are 2 + without bruits. No JVD  Lungs: Clear bilaterally to auscultation.  Heart: regular rate.  normal  S1 S2. No murmurs, gallops or rubs.  Abdomen: Soft, non-tender, non-distended with normal bowel sounds. No hepatomegaly. No rebound/guarding. No masses.  Msk:  Strength and tone are normal  Extremities: No clubbing or cyanosis. No edema.  Distal pedal pulses are 2+ and equal bilaterally.  Neuro: Alert and oriented X 3. Moves all extremities spontaneously.  Psych:  Responds to questions appropriately with a normal affect.  ECG: 07/15/2011-normal sinus rhythm at 64 beats a minute. His left axis deviation. Possible inferior infarct-age undetermined  Assessment / Plan:

## 2011-07-15 NOTE — Assessment & Plan Note (Signed)
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 He's done very well.  We'll continue to keep a close eye on his blood pressure.

## 2011-07-15 NOTE — Assessment & Plan Note (Signed)
Jeff Johnson continues to have episodes of dizziness and orthostasis. This is been evaluated completely. He's had a  negative 30 day monitor. He's had a negative tilt table test. He has been seen by Dr. Ladona Ridgel for this. I do not have any further suggestions.

## 2011-07-15 NOTE — Patient Instructions (Signed)
Your physician wants you to follow-up in: 6 months  You will receive a reminder letter in the mail two months in advance. If you don't receive a letter, please call our office to schedule the follow-up appointment.  Your physician recommends that you continue on your current medications as directed. Please refer to the Current Medication list given to you today.  

## 2011-07-19 DIAGNOSIS — R310 Gross hematuria: Secondary | ICD-10-CM | POA: Diagnosis not present

## 2011-07-27 DIAGNOSIS — C61 Malignant neoplasm of prostate: Secondary | ICD-10-CM | POA: Diagnosis not present

## 2011-07-27 DIAGNOSIS — R310 Gross hematuria: Secondary | ICD-10-CM | POA: Diagnosis not present

## 2011-07-27 DIAGNOSIS — R300 Dysuria: Secondary | ICD-10-CM | POA: Diagnosis not present

## 2011-08-17 ENCOUNTER — Encounter: Payer: Self-pay | Admitting: Cardiovascular Disease

## 2011-08-30 DIAGNOSIS — R7989 Other specified abnormal findings of blood chemistry: Secondary | ICD-10-CM | POA: Diagnosis not present

## 2011-08-30 DIAGNOSIS — R6889 Other general symptoms and signs: Secondary | ICD-10-CM | POA: Diagnosis not present

## 2011-08-30 DIAGNOSIS — R17 Unspecified jaundice: Secondary | ICD-10-CM | POA: Diagnosis not present

## 2011-08-30 DIAGNOSIS — Z79899 Other long term (current) drug therapy: Secondary | ICD-10-CM | POA: Diagnosis not present

## 2011-08-30 DIAGNOSIS — Z20828 Contact with and (suspected) exposure to other viral communicable diseases: Secondary | ICD-10-CM | POA: Diagnosis not present

## 2011-09-28 DIAGNOSIS — R6889 Other general symptoms and signs: Secondary | ICD-10-CM | POA: Diagnosis not present

## 2011-09-28 DIAGNOSIS — Z23 Encounter for immunization: Secondary | ICD-10-CM | POA: Diagnosis not present

## 2011-11-22 DIAGNOSIS — Z23 Encounter for immunization: Secondary | ICD-10-CM | POA: Diagnosis not present

## 2011-11-29 ENCOUNTER — Other Ambulatory Visit: Payer: Self-pay | Admitting: Cardiothoracic Surgery

## 2011-11-29 DIAGNOSIS — I7101 Dissection of thoracic aorta: Secondary | ICD-10-CM

## 2011-12-22 ENCOUNTER — Encounter: Payer: Self-pay | Admitting: Cardiothoracic Surgery

## 2011-12-22 ENCOUNTER — Ambulatory Visit (INDEPENDENT_AMBULATORY_CARE_PROVIDER_SITE_OTHER): Payer: Medicare Other | Admitting: Cardiothoracic Surgery

## 2011-12-22 ENCOUNTER — Ambulatory Visit
Admission: RE | Admit: 2011-12-22 | Discharge: 2011-12-22 | Disposition: A | Discharge status: Home / Self Care | Payer: Medicare Other | Source: Ambulatory Visit | Attending: Cardiothoracic Surgery | Admitting: Cardiothoracic Surgery

## 2011-12-22 VITALS — BP 123/72 | HR 69 | Resp 18 | Ht 72.0 in | Wt 240.0 lb

## 2011-12-22 DIAGNOSIS — I712 Thoracic aortic aneurysm, without rupture: Secondary | ICD-10-CM

## 2011-12-22 DIAGNOSIS — Z09 Encounter for follow-up examination after completed treatment for conditions other than malignant neoplasm: Secondary | ICD-10-CM

## 2011-12-22 DIAGNOSIS — I359 Nonrheumatic aortic valve disorder, unspecified: Secondary | ICD-10-CM

## 2011-12-22 DIAGNOSIS — R42 Dizziness and giddiness: Secondary | ICD-10-CM | POA: Diagnosis not present

## 2011-12-22 DIAGNOSIS — J984 Other disorders of lung: Secondary | ICD-10-CM | POA: Diagnosis not present

## 2011-12-22 DIAGNOSIS — I350 Nonrheumatic aortic (valve) stenosis: Secondary | ICD-10-CM

## 2011-12-22 DIAGNOSIS — I7101 Dissection of thoracic aorta: Secondary | ICD-10-CM

## 2011-12-22 NOTE — Progress Notes (Addendum)
PCP is DEWEY,ELIZABETH, MD Referring Provider is Nahser, Deloris Ping, MD  Chief Complaint  Patient presents with  . Follow-up    1 year with CT CHEST W/O CONTRAST s/p BENTALL procedure 12/11/09    HPI: 66 year old obese male returns for 2 year followup after undergoing a biologic-Bentall procedure to replace the aortic root and ascending thoracic aorta for a fusiform aneurysm with aortic insufficiency. He is done well. 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 He has persistent problems with orthostatic dizziness and giddiness. He has been evaluated by EP. He is been evaluated with a tilt table. All the tests are nonrevealing. He continues to have problems regularly with his symptoms. He does take Flomax twice a day may be a contributing factor to his orthostatic dizziness. He cannot function without Flomax and he is taking it for several years.  Past Medical History  Diagnosis Date  . Hypotension, unspecified   . Dizziness and giddiness   . Heart murmur   . Aortic stenosis 12/11/09  . Aortic aneurysm 12/11/09    Past Surgical History  Procedure Date  . Tissue aortic valve replacement 12/11/09  . Thoracic aortic aneurysm repair 12/11/09    ASCENDING THORACIC AORTIC ANEURYSM REPAIR  . 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 Cholecystectomy 2010  . Colon surgery 2010    colon resection  . Doppler carotid 2012  . Doppler echocardiography 2011    Family History  Problem Relation Age of Onset  . Heart disease Father 37  . Stroke Mother 24    Social History History  Substance Use Topics  . Smoking status: Former Smoker    Types: Cigarettes    Quit date: 03/16/1967  . Smokeless tobacco: Not on file  . Alcohol Use: No    Current Outpatient Prescriptions  Medication Sig Dispense Refill  . aspirin 81 MG tablet Take 81 mg by mouth daily.      Marland Kitchen FLUoxetine (PROZAC) 20 MG capsule Take 20 mg by mouth daily.       . lansoprazole (PREVACID) 30 MG capsule Take 30 mg by  mouth daily.        . midodrine (PROAMATINE) 10 MG tablet Take 10 mg by mouth 2 (two) times daily with a meal.      . simvastatin (ZOCOR) 20 MG tablet Take 20 mg by mouth at bedtime.        . Tamsulosin HCl (FLOMAX PO) Take 0.4 mg by mouth daily.        Allergies  Allergen Reactions  . Iodinated Diagnostic Agents Anaphylaxis    Review of Systems no weight loss or fever. No angina. No symptoms of CHF. No active dental complaints.  BP 123/72  Pulse 69  Resp 18  Ht 6' (1.829 m)  Wt 240 lb (108.863 kg)  BMI 32.55 kg/m2  SpO2 97% Physical Exam General alert and pleasant HEENT normocephalic pupils equal dentition good Chest well-healed sternal incision breath sounds clear Cardiac regular rhythm no murmur or S3 gallop Extremities good pulses neuro intact Diagnostic Tests: CT of the chest shows thoracic aorta repair to be intact without false aneurysm or new areas of dilatation of the native aorta, no pulmonary nodules, no pleural effusion  Impression: Stable course 2 years after surgical repair of the aortic root and ascending thoracic aorta. No further CT scans are needed. Annual visit with chest x-ray is planned.  Plan: We'll begin patient on  Midodrine  10 mg twice a day to  try to improve the problems with orthostatic dizziness.

## 2011-12-27 ENCOUNTER — Ambulatory Visit (INDEPENDENT_AMBULATORY_CARE_PROVIDER_SITE_OTHER): Payer: Medicare Other | Admitting: Cardiovascular Disease

## 2011-12-27 ENCOUNTER — Encounter: Payer: Self-pay | Admitting: Cardiovascular Disease

## 2011-12-27 VITALS — BP 128/74 | HR 72 | Ht 72.0 in | Wt 254.0 lb

## 2011-12-27 DIAGNOSIS — I359 Nonrheumatic aortic valve disorder, unspecified: Secondary | ICD-10-CM

## 2011-12-27 DIAGNOSIS — R42 Dizziness and giddiness: Secondary | ICD-10-CM

## 2011-12-27 DIAGNOSIS — I350 Nonrheumatic aortic (valve) stenosis: Secondary | ICD-10-CM

## 2011-12-27 NOTE — Assessment & Plan Note (Signed)
He continues to have episodes of orthostatic hypotension. The Midodrine has only partially controlled this.  It seems to cause him I have encouraged him to drink V8 juice every day. I'll see him again in one year.

## 2011-12-27 NOTE — Progress Notes (Signed)
Luci Bank Date of Birth  Sep 08, 1945 [facility]     [place] Office  [address]    [address]   [address] [place], [place]  [address]    [place], [place]  [address] [phone]  Fax  [phone]  [phone]  Fax [phone]  Problem list: 1. 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 History of colon cystitis 3. Hyperlipidemia 4. Dizziness - ? orthostasis.  Negative tilt table study.  30 day monitor negative.  Occurs with standing or sitting, did not improve wit Midodrine   History of Present Illness:  66 yo with hx of aortic valve disease - s/p Bentall procedure.    He's been having some problems with dizziness. He has seen Dr. Sharrell Ku. He's had a 30 day event monitor which was negative. He had a tilt table study which was negative. He has had episodes of dizziness while standing and with sitting. He usually has several seconds of warning. If he is driving his car he was able to pull over to the side of the road.  He has tried Midodrine but does not tolerate it very well.  It causes him to be anxious and also causes urinary retention. He also has been eating lots of salt and that helped some.  He complains of generalized fatigue.  He does not sleep very well.   He has had prostate cancer and has to go to the bathroom at least 3 times a night.  Current Outpatient Prescriptions on File Prior to Visit  Medication Sig Dispense Refill  . aspirin 81 MG tablet Take 81 mg by mouth daily.      Marland Kitchen FLUoxetine (PROZAC) 20 MG capsule Take 20 mg by mouth daily.       . lansoprazole (PREVACID) 30 MG capsule Take 30 mg by mouth daily.        . simvastatin (ZOCOR) 20 MG tablet Take 20 mg by mouth at bedtime.        . Tamsulosin HCl (FLOMAX PO) Take 0.4 mg by mouth daily.        Allergies  Allergen Reactions  . Iodinated Diagnostic Agents Anaphylaxis    Past Medical History  Diagnosis Date  . Hypotension, unspecified   . Dizziness  and giddiness   . Heart murmur   . Aortic stenosis 12/11/09  . Aortic aneurysm 12/11/09    Past Surgical History  Procedure Date  . Tissue aortic valve replacement 12/11/09  . Thoracic aortic aneurysm repair 12/11/09    ASCENDING THORACIC AORTIC ANEURYSM REPAIR  . 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 Cholecystectomy 2010  . Colon surgery 2010    colon resection  . Doppler carotid 2012  . Doppler echocardiography 2011    History  Smoking status  . Former Smoker  . Types: Cigarettes  . Quit date: 03/16/1967  Smokeless tobacco  . Not on file    History  Alcohol Use No    Family History  Problem Relation Age of Onset  . Heart disease Father 72  . Stroke Mother 27    Reviw of Systems:  Reviewed in the HPI.  All other systems are negative.  Physical Exam: Blood pressure 128/74, pulse 72, height 6' (1.829 m), weight 254 lb (115.214 kg), SpO2 96.00%. General: Well developed, well nourished, in no acute distress.  Head: Normocephalic, atraumatic, sclera non-icteric, mucus membranes are moist,   Neck: Supple. Carotids are 2 + without bruits. No  JVD  Lungs: Clear bilaterally to auscultation.  Heart: regular rate.  normal  S1 S2. No murmurs, gallops or rubs.  Abdomen: Soft, non-tender, non-distended with normal bowel sounds. No hepatomegaly. No rebound/guarding. No masses.  Msk:  Strength and tone are normal  Extremities: No clubbing or cyanosis. No edema.  Distal pedal pulses are 2+ and equal bilaterally.  Neuro: Alert and oriented X 3. Moves all extremities spontaneously.  Psych:  Responds to questions appropriately with a normal affect.  ECG: 07/15/2011-normal sinus rhythm at 64 beats a minute. His left axis deviation. Possible inferior infarct-age undetermined  Assessment / Plan:

## 2011-12-27 NOTE — Assessment & Plan Note (Signed)
Jeff Johnson is status post aortic valve replacement with a Bentall procedure.  He's done fairly well from a cardiac standpoint. He does have persistent orthostatic hypotension which has been only moderately well controlled with salt loading and Midodrine.  He still has occasional episodes of lightheadedness. He typically has a warning and is able to sit down. He occasionally has episodes while his job but always has time to pull over to the side of the road.  We'll continue the same medications. I'll see him again in one year for followup visit

## 2011-12-27 NOTE — Patient Instructions (Addendum)
Your physician wants you to follow-up in: 1 year. You will receive a reminder letter in the mail two months in advance. If you don't receive a letter, please call our office to schedule the follow-up appointment.  

## 2011-12-30 DIAGNOSIS — Z79899 Other long term (current) drug therapy: Secondary | ICD-10-CM | POA: Diagnosis not present

## 2011-12-30 DIAGNOSIS — Z0000 Encounter for general adult medical examination without abnormal findings: Secondary | ICD-10-CM | POA: Diagnosis not present

## 2011-12-30 DIAGNOSIS — H6120 Impacted cerumen, unspecified ear: Secondary | ICD-10-CM | POA: Diagnosis not present

## 2011-12-30 DIAGNOSIS — I10 Essential (primary) hypertension: Secondary | ICD-10-CM | POA: Diagnosis not present

## 2011-12-30 DIAGNOSIS — K219 Gastro-esophageal reflux disease without esophagitis: Secondary | ICD-10-CM | POA: Diagnosis not present

## 2011-12-30 DIAGNOSIS — F329 Major depressive disorder, single episode, unspecified: Secondary | ICD-10-CM | POA: Diagnosis not present

## 2011-12-30 DIAGNOSIS — E785 Hyperlipidemia, unspecified: Secondary | ICD-10-CM | POA: Diagnosis not present

## 2011-12-30 DIAGNOSIS — E559 Vitamin D deficiency, unspecified: Secondary | ICD-10-CM | POA: Diagnosis not present

## 2011-12-30 DIAGNOSIS — M19019 Primary osteoarthritis, unspecified shoulder: Secondary | ICD-10-CM | POA: Diagnosis not present

## 2012-01-17 DIAGNOSIS — C61 Malignant neoplasm of prostate: Secondary | ICD-10-CM | POA: Diagnosis not present

## 2012-01-21 DIAGNOSIS — R300 Dysuria: Secondary | ICD-10-CM | POA: Diagnosis not present

## 2012-01-21 DIAGNOSIS — C61 Malignant neoplasm of prostate: Secondary | ICD-10-CM | POA: Diagnosis not present

## 2012-02-03 DIAGNOSIS — R195 Other fecal abnormalities: Secondary | ICD-10-CM | POA: Diagnosis not present

## 2012-02-17 DIAGNOSIS — Z23 Encounter for immunization: Secondary | ICD-10-CM | POA: Diagnosis not present

## 2012-03-22 DIAGNOSIS — M67919 Unspecified disorder of synovium and tendon, unspecified shoulder: Secondary | ICD-10-CM | POA: Diagnosis not present

## 2012-03-22 DIAGNOSIS — S43429A Sprain of unspecified rotator cuff capsule, initial encounter: Secondary | ICD-10-CM | POA: Diagnosis not present

## 2012-03-22 DIAGNOSIS — G8918 Other acute postprocedural pain: Secondary | ICD-10-CM | POA: Diagnosis not present

## 2012-03-22 DIAGNOSIS — M719 Bursopathy, unspecified: Secondary | ICD-10-CM | POA: Diagnosis not present

## 2012-03-22 DIAGNOSIS — M19019 Primary osteoarthritis, unspecified shoulder: Secondary | ICD-10-CM | POA: Diagnosis not present

## 2012-04-04 DIAGNOSIS — M25519 Pain in unspecified shoulder: Secondary | ICD-10-CM | POA: Diagnosis not present

## 2012-04-04 DIAGNOSIS — M75120 Complete rotator cuff tear or rupture of unspecified shoulder, not specified as traumatic: Secondary | ICD-10-CM | POA: Diagnosis not present

## 2012-04-12 DIAGNOSIS — M25519 Pain in unspecified shoulder: Secondary | ICD-10-CM | POA: Diagnosis not present

## 2012-04-12 DIAGNOSIS — M75120 Complete rotator cuff tear or rupture of unspecified shoulder, not specified as traumatic: Secondary | ICD-10-CM | POA: Diagnosis not present

## 2012-04-13 DIAGNOSIS — M75120 Complete rotator cuff tear or rupture of unspecified shoulder, not specified as traumatic: Secondary | ICD-10-CM | POA: Diagnosis not present

## 2012-04-13 DIAGNOSIS — M25519 Pain in unspecified shoulder: Secondary | ICD-10-CM | POA: Diagnosis not present

## 2012-04-17 DIAGNOSIS — M75120 Complete rotator cuff tear or rupture of unspecified shoulder, not specified as traumatic: Secondary | ICD-10-CM | POA: Diagnosis not present

## 2012-04-17 DIAGNOSIS — M25519 Pain in unspecified shoulder: Secondary | ICD-10-CM | POA: Diagnosis not present

## 2012-04-19 DIAGNOSIS — M25519 Pain in unspecified shoulder: Secondary | ICD-10-CM | POA: Diagnosis not present

## 2012-04-19 DIAGNOSIS — M75120 Complete rotator cuff tear or rupture of unspecified shoulder, not specified as traumatic: Secondary | ICD-10-CM | POA: Diagnosis not present

## 2012-04-24 DIAGNOSIS — M75120 Complete rotator cuff tear or rupture of unspecified shoulder, not specified as traumatic: Secondary | ICD-10-CM | POA: Diagnosis not present

## 2012-04-24 DIAGNOSIS — M25519 Pain in unspecified shoulder: Secondary | ICD-10-CM | POA: Diagnosis not present

## 2012-05-01 DIAGNOSIS — M75120 Complete rotator cuff tear or rupture of unspecified shoulder, not specified as traumatic: Secondary | ICD-10-CM | POA: Diagnosis not present

## 2012-05-01 DIAGNOSIS — M25519 Pain in unspecified shoulder: Secondary | ICD-10-CM | POA: Diagnosis not present

## 2012-05-02 DIAGNOSIS — M25519 Pain in unspecified shoulder: Secondary | ICD-10-CM | POA: Insufficient documentation

## 2012-05-04 DIAGNOSIS — M75120 Complete rotator cuff tear or rupture of unspecified shoulder, not specified as traumatic: Secondary | ICD-10-CM | POA: Diagnosis not present

## 2012-05-04 DIAGNOSIS — M25519 Pain in unspecified shoulder: Secondary | ICD-10-CM | POA: Diagnosis not present

## 2012-05-08 DIAGNOSIS — M75120 Complete rotator cuff tear or rupture of unspecified shoulder, not specified as traumatic: Secondary | ICD-10-CM | POA: Diagnosis not present

## 2012-05-08 DIAGNOSIS — M25519 Pain in unspecified shoulder: Secondary | ICD-10-CM | POA: Diagnosis not present

## 2012-05-10 DIAGNOSIS — M75120 Complete rotator cuff tear or rupture of unspecified shoulder, not specified as traumatic: Secondary | ICD-10-CM | POA: Diagnosis not present

## 2012-05-10 DIAGNOSIS — M25519 Pain in unspecified shoulder: Secondary | ICD-10-CM | POA: Diagnosis not present

## 2012-05-15 DIAGNOSIS — M75120 Complete rotator cuff tear or rupture of unspecified shoulder, not specified as traumatic: Secondary | ICD-10-CM | POA: Diagnosis not present

## 2012-05-15 DIAGNOSIS — M25519 Pain in unspecified shoulder: Secondary | ICD-10-CM | POA: Diagnosis not present

## 2012-05-17 DIAGNOSIS — M25519 Pain in unspecified shoulder: Secondary | ICD-10-CM | POA: Diagnosis not present

## 2012-05-17 DIAGNOSIS — M75120 Complete rotator cuff tear or rupture of unspecified shoulder, not specified as traumatic: Secondary | ICD-10-CM | POA: Diagnosis not present

## 2012-05-22 DIAGNOSIS — M25519 Pain in unspecified shoulder: Secondary | ICD-10-CM | POA: Diagnosis not present

## 2012-05-22 DIAGNOSIS — M75120 Complete rotator cuff tear or rupture of unspecified shoulder, not specified as traumatic: Secondary | ICD-10-CM | POA: Diagnosis not present

## 2012-05-24 DIAGNOSIS — M75120 Complete rotator cuff tear or rupture of unspecified shoulder, not specified as traumatic: Secondary | ICD-10-CM | POA: Diagnosis not present

## 2012-05-24 DIAGNOSIS — M25519 Pain in unspecified shoulder: Secondary | ICD-10-CM | POA: Diagnosis not present

## 2012-05-29 DIAGNOSIS — M75120 Complete rotator cuff tear or rupture of unspecified shoulder, not specified as traumatic: Secondary | ICD-10-CM | POA: Diagnosis not present

## 2012-05-29 DIAGNOSIS — M25519 Pain in unspecified shoulder: Secondary | ICD-10-CM | POA: Diagnosis not present

## 2012-06-01 DIAGNOSIS — M75120 Complete rotator cuff tear or rupture of unspecified shoulder, not specified as traumatic: Secondary | ICD-10-CM | POA: Diagnosis not present

## 2012-06-01 DIAGNOSIS — M25519 Pain in unspecified shoulder: Secondary | ICD-10-CM | POA: Diagnosis not present

## 2012-06-07 DIAGNOSIS — M25519 Pain in unspecified shoulder: Secondary | ICD-10-CM | POA: Diagnosis not present

## 2012-06-07 DIAGNOSIS — M75120 Complete rotator cuff tear or rupture of unspecified shoulder, not specified as traumatic: Secondary | ICD-10-CM | POA: Diagnosis not present

## 2012-06-08 DIAGNOSIS — M25519 Pain in unspecified shoulder: Secondary | ICD-10-CM | POA: Diagnosis not present

## 2012-06-08 DIAGNOSIS — M75120 Complete rotator cuff tear or rupture of unspecified shoulder, not specified as traumatic: Secondary | ICD-10-CM | POA: Diagnosis not present

## 2012-06-12 DIAGNOSIS — M75120 Complete rotator cuff tear or rupture of unspecified shoulder, not specified as traumatic: Secondary | ICD-10-CM | POA: Diagnosis not present

## 2012-06-12 DIAGNOSIS — M25519 Pain in unspecified shoulder: Secondary | ICD-10-CM | POA: Diagnosis not present

## 2012-06-15 DIAGNOSIS — M75120 Complete rotator cuff tear or rupture of unspecified shoulder, not specified as traumatic: Secondary | ICD-10-CM | POA: Diagnosis not present

## 2012-06-15 DIAGNOSIS — M25519 Pain in unspecified shoulder: Secondary | ICD-10-CM | POA: Diagnosis not present

## 2012-06-19 DIAGNOSIS — M75120 Complete rotator cuff tear or rupture of unspecified shoulder, not specified as traumatic: Secondary | ICD-10-CM | POA: Diagnosis not present

## 2012-06-19 DIAGNOSIS — M25519 Pain in unspecified shoulder: Secondary | ICD-10-CM | POA: Diagnosis not present

## 2012-06-22 DIAGNOSIS — M75120 Complete rotator cuff tear or rupture of unspecified shoulder, not specified as traumatic: Secondary | ICD-10-CM | POA: Diagnosis not present

## 2012-06-22 DIAGNOSIS — M25519 Pain in unspecified shoulder: Secondary | ICD-10-CM | POA: Diagnosis not present

## 2012-07-03 DIAGNOSIS — I719 Aortic aneurysm of unspecified site, without rupture: Secondary | ICD-10-CM | POA: Insufficient documentation

## 2012-07-03 DIAGNOSIS — E785 Hyperlipidemia, unspecified: Secondary | ICD-10-CM | POA: Diagnosis not present

## 2012-07-03 DIAGNOSIS — Z8546 Personal history of malignant neoplasm of prostate: Secondary | ICD-10-CM | POA: Insufficient documentation

## 2012-07-03 DIAGNOSIS — K219 Gastro-esophageal reflux disease without esophagitis: Secondary | ICD-10-CM | POA: Diagnosis not present

## 2012-07-03 DIAGNOSIS — K5732 Diverticulitis of large intestine without perforation or abscess without bleeding: Secondary | ICD-10-CM | POA: Diagnosis not present

## 2012-07-03 DIAGNOSIS — Z954 Presence of other heart-valve replacement: Secondary | ICD-10-CM | POA: Diagnosis not present

## 2012-07-03 DIAGNOSIS — F329 Major depressive disorder, single episode, unspecified: Secondary | ICD-10-CM | POA: Diagnosis not present

## 2012-07-03 DIAGNOSIS — E559 Vitamin D deficiency, unspecified: Secondary | ICD-10-CM | POA: Diagnosis not present

## 2012-07-05 DIAGNOSIS — M75120 Complete rotator cuff tear or rupture of unspecified shoulder, not specified as traumatic: Secondary | ICD-10-CM | POA: Diagnosis not present

## 2012-07-05 DIAGNOSIS — M25519 Pain in unspecified shoulder: Secondary | ICD-10-CM | POA: Diagnosis not present

## 2012-07-10 DIAGNOSIS — M75120 Complete rotator cuff tear or rupture of unspecified shoulder, not specified as traumatic: Secondary | ICD-10-CM | POA: Diagnosis not present

## 2012-07-10 DIAGNOSIS — M25519 Pain in unspecified shoulder: Secondary | ICD-10-CM | POA: Diagnosis not present

## 2012-07-11 ENCOUNTER — Other Ambulatory Visit: Payer: Self-pay | Admitting: Orthopedic Surgery

## 2012-07-11 DIAGNOSIS — M25512 Pain in left shoulder: Secondary | ICD-10-CM

## 2012-07-12 DIAGNOSIS — M75120 Complete rotator cuff tear or rupture of unspecified shoulder, not specified as traumatic: Secondary | ICD-10-CM | POA: Diagnosis not present

## 2012-07-12 DIAGNOSIS — M25519 Pain in unspecified shoulder: Secondary | ICD-10-CM | POA: Diagnosis not present

## 2012-07-14 DIAGNOSIS — E785 Hyperlipidemia, unspecified: Secondary | ICD-10-CM | POA: Diagnosis not present

## 2012-07-15 ENCOUNTER — Ambulatory Visit
Admission: RE | Admit: 2012-07-15 | Discharge: 2012-07-15 | Disposition: A | Discharge status: Home / Self Care | Payer: Medicare Other | Source: Ambulatory Visit | Attending: Orthopedic Surgery | Admitting: Orthopedic Surgery

## 2012-07-15 DIAGNOSIS — M25512 Pain in left shoulder: Secondary | ICD-10-CM

## 2012-07-15 DIAGNOSIS — M719 Bursopathy, unspecified: Secondary | ICD-10-CM | POA: Diagnosis not present

## 2012-07-15 DIAGNOSIS — M19019 Primary osteoarthritis, unspecified shoulder: Secondary | ICD-10-CM | POA: Diagnosis not present

## 2012-07-21 DIAGNOSIS — C61 Malignant neoplasm of prostate: Secondary | ICD-10-CM | POA: Diagnosis not present

## 2012-07-27 DIAGNOSIS — C61 Malignant neoplasm of prostate: Secondary | ICD-10-CM | POA: Diagnosis not present

## 2012-08-04 DIAGNOSIS — M75120 Complete rotator cuff tear or rupture of unspecified shoulder, not specified as traumatic: Secondary | ICD-10-CM | POA: Diagnosis not present

## 2012-08-15 DIAGNOSIS — S43439A Superior glenoid labrum lesion of unspecified shoulder, initial encounter: Secondary | ICD-10-CM | POA: Diagnosis not present

## 2012-08-15 DIAGNOSIS — Y939 Activity, unspecified: Secondary | ICD-10-CM | POA: Diagnosis not present

## 2012-08-15 DIAGNOSIS — X58XXXA Exposure to other specified factors, initial encounter: Secondary | ICD-10-CM | POA: Diagnosis not present

## 2012-08-15 DIAGNOSIS — Y999 Unspecified external cause status: Secondary | ICD-10-CM | POA: Diagnosis not present

## 2012-08-15 DIAGNOSIS — S46819A Strain of other muscles, fascia and tendons at shoulder and upper arm level, unspecified arm, initial encounter: Secondary | ICD-10-CM | POA: Diagnosis not present

## 2012-08-15 DIAGNOSIS — G8918 Other acute postprocedural pain: Secondary | ICD-10-CM | POA: Diagnosis not present

## 2012-08-15 DIAGNOSIS — M24119 Other articular cartilage disorders, unspecified shoulder: Secondary | ICD-10-CM | POA: Diagnosis not present

## 2012-08-15 DIAGNOSIS — Y929 Unspecified place or not applicable: Secondary | ICD-10-CM | POA: Diagnosis not present

## 2012-08-15 DIAGNOSIS — S43429A Sprain of unspecified rotator cuff capsule, initial encounter: Secondary | ICD-10-CM | POA: Diagnosis not present

## 2012-10-02 DIAGNOSIS — M75120 Complete rotator cuff tear or rupture of unspecified shoulder, not specified as traumatic: Secondary | ICD-10-CM | POA: Diagnosis not present

## 2012-10-02 DIAGNOSIS — M25519 Pain in unspecified shoulder: Secondary | ICD-10-CM | POA: Diagnosis not present

## 2012-10-05 DIAGNOSIS — M75120 Complete rotator cuff tear or rupture of unspecified shoulder, not specified as traumatic: Secondary | ICD-10-CM | POA: Diagnosis not present

## 2012-10-09 DIAGNOSIS — M75120 Complete rotator cuff tear or rupture of unspecified shoulder, not specified as traumatic: Secondary | ICD-10-CM | POA: Diagnosis not present

## 2012-10-11 DIAGNOSIS — M75120 Complete rotator cuff tear or rupture of unspecified shoulder, not specified as traumatic: Secondary | ICD-10-CM | POA: Diagnosis not present

## 2012-10-12 DIAGNOSIS — I951 Orthostatic hypotension: Secondary | ICD-10-CM | POA: Diagnosis not present

## 2012-10-16 DIAGNOSIS — M75120 Complete rotator cuff tear or rupture of unspecified shoulder, not specified as traumatic: Secondary | ICD-10-CM | POA: Diagnosis not present

## 2012-10-19 DIAGNOSIS — M75120 Complete rotator cuff tear or rupture of unspecified shoulder, not specified as traumatic: Secondary | ICD-10-CM | POA: Diagnosis not present

## 2012-10-23 DIAGNOSIS — M75120 Complete rotator cuff tear or rupture of unspecified shoulder, not specified as traumatic: Secondary | ICD-10-CM | POA: Diagnosis not present

## 2012-10-25 DIAGNOSIS — M75120 Complete rotator cuff tear or rupture of unspecified shoulder, not specified as traumatic: Secondary | ICD-10-CM | POA: Diagnosis not present

## 2012-10-25 DIAGNOSIS — M25519 Pain in unspecified shoulder: Secondary | ICD-10-CM | POA: Diagnosis not present

## 2012-10-30 DIAGNOSIS — M75120 Complete rotator cuff tear or rupture of unspecified shoulder, not specified as traumatic: Secondary | ICD-10-CM | POA: Diagnosis not present

## 2012-11-01 DIAGNOSIS — M75120 Complete rotator cuff tear or rupture of unspecified shoulder, not specified as traumatic: Secondary | ICD-10-CM | POA: Diagnosis not present

## 2012-11-02 DIAGNOSIS — F330 Major depressive disorder, recurrent, mild: Secondary | ICD-10-CM | POA: Diagnosis not present

## 2012-11-02 DIAGNOSIS — E782 Mixed hyperlipidemia: Secondary | ICD-10-CM | POA: Diagnosis not present

## 2012-11-02 DIAGNOSIS — C61 Malignant neoplasm of prostate: Secondary | ICD-10-CM | POA: Diagnosis not present

## 2012-11-02 DIAGNOSIS — I951 Orthostatic hypotension: Secondary | ICD-10-CM | POA: Diagnosis not present

## 2012-11-29 DIAGNOSIS — J069 Acute upper respiratory infection, unspecified: Secondary | ICD-10-CM | POA: Diagnosis not present

## 2012-11-29 DIAGNOSIS — E782 Mixed hyperlipidemia: Secondary | ICD-10-CM | POA: Diagnosis not present

## 2012-11-29 DIAGNOSIS — H6120 Impacted cerumen, unspecified ear: Secondary | ICD-10-CM | POA: Diagnosis not present

## 2012-12-02 DIAGNOSIS — Z23 Encounter for immunization: Secondary | ICD-10-CM | POA: Diagnosis not present

## 2012-12-20 ENCOUNTER — Ambulatory Visit: Payer: Medicare Other | Admitting: Cardiothoracic Surgery

## 2012-12-25 ENCOUNTER — Other Ambulatory Visit: Payer: Self-pay | Admitting: *Deleted

## 2012-12-25 DIAGNOSIS — I7101 Dissection of thoracic aorta: Secondary | ICD-10-CM

## 2012-12-27 ENCOUNTER — Ambulatory Visit
Admission: RE | Admit: 2012-12-27 | Discharge: 2012-12-27 | Disposition: A | Discharge status: Home / Self Care | Payer: Medicare Other | Source: Ambulatory Visit | Attending: Surgery | Admitting: Surgery

## 2012-12-27 ENCOUNTER — Ambulatory Visit (INDEPENDENT_AMBULATORY_CARE_PROVIDER_SITE_OTHER): Payer: Medicare Other | Admitting: Cardiothoracic Surgery

## 2012-12-27 ENCOUNTER — Encounter: Payer: Self-pay | Admitting: Cardiothoracic Surgery

## 2012-12-27 VITALS — BP 126/76 | HR 71 | Resp 20 | Ht 72.0 in | Wt 252.0 lb

## 2012-12-27 DIAGNOSIS — Z09 Encounter for follow-up examination after completed treatment for conditions other than malignant neoplasm: Secondary | ICD-10-CM

## 2012-12-27 DIAGNOSIS — I7101 Dissection of thoracic aorta: Secondary | ICD-10-CM

## 2012-12-27 DIAGNOSIS — J90 Pleural effusion, not elsewhere classified: Secondary | ICD-10-CM | POA: Diagnosis not present

## 2012-12-27 DIAGNOSIS — I71019 Dissection of thoracic aorta, unspecified: Secondary | ICD-10-CM

## 2012-12-27 NOTE — Progress Notes (Signed)
PCP is DEWEY,ELIZABETH, MD Referring Provider is Nahser, Deloris Ping, MD  Chief Complaint  Patient presents with  . Follow-up    1 year f/u with CXR S/P repair aortic root ascending thoracic aorta    HPI: 3 years postop Bentall procedure with a biologic tissue valve. Continues to do well from a cardiac standpoint but has persistent orthostatic hypotension improved with Midodrine 10 mg by mouth twice a day. Since his last visit he has undergone left rotator cuff surgery twice and has retired as a Electrical engineer. He states his midodrine has improved his orthostatic dizziness. He has his blood pressure checked frequently among is different physicians including primary care, cardiologist, orthopedic surgeon, and urologist. He indicates he has had no elevated blood pressures   Past Medical History  Diagnosis Date  . Hypotension, unspecified   . Dizziness and giddiness   . Heart murmur   . Aortic stenosis 12/11/09  . Aortic aneurysm 12/11/09    Past Surgical History  Procedure Laterality Date  . Tissue aortic valve replacement  12/11/09  . 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 Cholecystectomy  2010  . Colon surgery  2010    colon resection  . Doppler carotid  2012  . Doppler echocardiography  2011    Family History  Problem Relation Age of Onset  . Heart disease Father 5  . Stroke Mother 64    Social History History  Substance Use Topics  . Smoking status: Former Smoker    Types: Cigarettes    Quit date: 03/16/1967  . Smokeless tobacco: Not on file  . Alcohol Use: No    Current Outpatient Prescriptions  Medication Sig Dispense Refill  . aspirin 81 MG tablet Take 81 mg by mouth daily.      Marland Kitchen FLUoxetine (PROZAC) 20 MG capsule Take 10 mg by mouth daily. 10 mg po every day      . lansoprazole (PREVACID) 30 MG capsule Take 30 mg by mouth daily.        . midodrine  (PROAMATINE) 10 MG tablet Take 10 mg by mouth 2 (two) times daily.      . simvastatin (ZOCOR) 20 MG tablet Take 20 mg by mouth at bedtime.        . Tamsulosin HCl (FLOMAX PO) Take 0.4 mg by mouth daily.       No current facility-administered medications for this visit.    Allergies  Allergen Reactions  . Iodinated Diagnostic Agents Anaphylaxis    Review of Systems left arm and shoulder pain following rotator cuff surgery. Persistent problems with orthostatic dizziness improved with needed during  BP 126/76  Pulse 71  Resp 20  Ht 6' (1.829 m)  Wt 252 lb (114.306 kg)  BMI 34.17 kg/m2  SpO2 98% Physical Exam Alert and comfortable lungs clear Cardiac rhythm regular no murmur or gallop Peripheral pulses intact no pedal edema  Diagnostic Tests: Chest x-ray clear  Impression: Doing well 3 years after a biologic-Bentall.  Plan: Continue midodrine and return in 6 months for followup and blood pressure check

## 2013-01-01 DIAGNOSIS — M75120 Complete rotator cuff tear or rupture of unspecified shoulder, not specified as traumatic: Secondary | ICD-10-CM | POA: Diagnosis not present

## 2013-01-09 ENCOUNTER — Encounter: Payer: Self-pay | Admitting: Cardiovascular Disease

## 2013-01-18 ENCOUNTER — Other Ambulatory Visit: Payer: Self-pay

## 2013-01-18 ENCOUNTER — Ambulatory Visit (INDEPENDENT_AMBULATORY_CARE_PROVIDER_SITE_OTHER): Payer: Medicare Other | Admitting: Cardiovascular Disease

## 2013-01-18 ENCOUNTER — Encounter: Payer: Self-pay | Admitting: Cardiovascular Disease

## 2013-01-18 VITALS — BP 122/64 | HR 71 | Ht 72.0 in | Wt 255.0 lb

## 2013-01-18 DIAGNOSIS — I359 Nonrheumatic aortic valve disorder, unspecified: Secondary | ICD-10-CM | POA: Diagnosis not present

## 2013-01-18 DIAGNOSIS — I719 Aortic aneurysm of unspecified site, without rupture: Secondary | ICD-10-CM

## 2013-01-18 DIAGNOSIS — I959 Hypotension, unspecified: Secondary | ICD-10-CM

## 2013-01-18 DIAGNOSIS — I350 Nonrheumatic aortic (valve) stenosis: Secondary | ICD-10-CM

## 2013-01-18 MED ORDER — SILDENAFIL CITRATE 100 MG PO TABS: 100.0000 mg | ORAL_TABLET | Freq: Every day | ORAL | Status: DC | PRN

## 2013-01-18 NOTE — Assessment & Plan Note (Signed)
He has occasional episodes of orthostatic hypotension Stable

## 2013-01-18 NOTE — Progress Notes (Signed)
Jeff Johnson Date of Birth  Aug 03, 1945 [facility]     [place] Office  [address]    [address]   [address] [place], [place]  [address]    [place], [place]  [address] [phone]  Fax  [phone]  [phone]  Fax [phone]  Problem list: 1. 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 History of chronic cystitis 3. Hyperlipidemia 4. Dizziness - ? orthostasis.  Negative tilt table study.  30 day monitor negative.  Occurs with standing or sitting, did not improve wit Midodrine  History of Present Illness:  67 yo with hx of aortic valve disease - s/p Bentall procedure.    He's been having some problems with dizziness. He has seen Dr. Sharrell Ku. He's had a 30 day event monitor which was negative. He had a tilt table study which was negative. He has had episodes of dizziness while standing and with sitting. He usually has several seconds of warning. If he is driving his car he was able to pull over to the side of the road.  He has tried Midodrine but does not tolerate it very well.  It causes him to be anxious and also causes urinary retention. He also has been eating lots of salt and that helped some.  He complains of generalized fatigue.  He does not sleep very well.   He has had prostate cancer and has to go to the bathroom at least 3 times a night.  Nov. 6, 2014:    Jeff Johnson is doing well.  He is working part time which involves standing and walking for 8 hours.   ( [employer])  No CP, no dyspnea.   Current Outpatient Prescriptions on File Prior to Visit  Medication Sig Dispense Refill  . aspirin 81 MG tablet Take 81 mg by mouth daily.      Marland Kitchen FLUoxetine (PROZAC) 20 MG capsule Take 10 mg by mouth daily. 10 mg po every day      . lansoprazole (PREVACID) 30 MG capsule Take 30 mg by mouth daily.        . midodrine (PROAMATINE) 10 MG tablet Take 10 mg by mouth daily.       . simvastatin (ZOCOR) 20 MG tablet Take 20 mg  by mouth daily.       . Tamsulosin HCl (FLOMAX PO) Take 0.4 mg by mouth daily.       No current facility-administered medications on file prior to visit.    Allergies  Allergen Reactions  . Iodinated Diagnostic Agents Anaphylaxis    Past Medical History  Diagnosis Date  . Hypotension, unspecified   . Dizziness and giddiness   . Heart murmur   . Aortic stenosis 12/11/09  . Aortic aneurysm 12/11/09    Past Surgical History  Procedure Laterality Date  . Tissue aortic valve replacement  12/11/09  . 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 Cholecystectomy  2010  . Colon surgery  2010    colon resection  . Doppler carotid  2012  . Doppler echocardiography  2011    History  Smoking status  . Former Smoker  . Types: Cigarettes  . Quit date: 03/16/1967  Smokeless tobacco  . Not on file    History  Alcohol Use No    Family History  Problem Relation Age of Onset  .  Heart disease Father 60  . Stroke Mother 104    Reviw of Systems:  Reviewed in the HPI.  All other systems are negative.  Physical Exam: Blood pressure 122/64, pulse 71, height 6' (1.829 m), weight 255 lb (115.667 kg). General: Well developed, well nourished, in no acute distress.  Head: Normocephalic, atraumatic, sclera non-icteric, mucus membranes are moist,   Neck: Supple. Carotids are 2 + without bruits. No JVD  Lungs: Clear bilaterally to auscultation.  Heart: regular rate.  normal  S1 S2. No murmurs, gallops or rubs.  Abdomen: Soft, non-tender, non-distended with normal bowel sounds. No hepatomegaly. No rebound/guarding. No masses.  Msk:  Strength and tone are normal  Extremities: No clubbing or cyanosis. No edema.  Distal pedal pulses are 2+ and equal bilaterally.  Neuro: Alert and oriented X 3. Moves all extremities spontaneously.  Psych:  Responds to questions appropriately with a  normal affect.  ECG: Nov. 6, 2014;  NSR at 44, old inf. Mi  - no changes  Assessment / Plan:

## 2013-01-18 NOTE — Patient Instructions (Signed)
Your physician wants you to follow-up in: 1 year  You will receive a reminder letter in the mail two months in advance. If you don't receive a letter, please call our office to schedule the follow-up appointment.   Your physician has recommended you make the following change in your medication:  Start viagra/ sent to pharmacy

## 2013-01-18 NOTE — Assessment & Plan Note (Signed)
He status post Bentall procedure.  He seems to be doing well

## 2013-01-22 DIAGNOSIS — C61 Malignant neoplasm of prostate: Secondary | ICD-10-CM | POA: Diagnosis not present

## 2013-01-29 DIAGNOSIS — R350 Frequency of micturition: Secondary | ICD-10-CM | POA: Diagnosis not present

## 2013-01-29 DIAGNOSIS — C61 Malignant neoplasm of prostate: Secondary | ICD-10-CM | POA: Diagnosis not present

## 2013-02-28 DIAGNOSIS — F330 Major depressive disorder, recurrent, mild: Secondary | ICD-10-CM | POA: Diagnosis not present

## 2013-02-28 DIAGNOSIS — E782 Mixed hyperlipidemia: Secondary | ICD-10-CM | POA: Diagnosis not present

## 2013-03-16 ENCOUNTER — Other Ambulatory Visit: Payer: Self-pay | Admitting: Dermatology

## 2013-03-16 DIAGNOSIS — D485 Neoplasm of uncertain behavior of skin: Secondary | ICD-10-CM | POA: Diagnosis not present

## 2013-03-16 DIAGNOSIS — D2360 Other benign neoplasm of skin of unspecified upper limb, including shoulder: Secondary | ICD-10-CM | POA: Diagnosis not present

## 2013-03-16 DIAGNOSIS — B079 Viral wart, unspecified: Secondary | ICD-10-CM | POA: Diagnosis not present

## 2013-04-25 DIAGNOSIS — M47812 Spondylosis without myelopathy or radiculopathy, cervical region: Secondary | ICD-10-CM | POA: Diagnosis not present

## 2013-05-01 DIAGNOSIS — M5412 Radiculopathy, cervical region: Secondary | ICD-10-CM | POA: Diagnosis not present

## 2013-05-01 DIAGNOSIS — M542 Cervicalgia: Secondary | ICD-10-CM | POA: Diagnosis not present

## 2013-05-08 DIAGNOSIS — M5412 Radiculopathy, cervical region: Secondary | ICD-10-CM | POA: Diagnosis not present

## 2013-05-08 DIAGNOSIS — M542 Cervicalgia: Secondary | ICD-10-CM | POA: Diagnosis not present

## 2013-05-11 DIAGNOSIS — M542 Cervicalgia: Secondary | ICD-10-CM | POA: Diagnosis not present

## 2013-05-11 DIAGNOSIS — M5412 Radiculopathy, cervical region: Secondary | ICD-10-CM | POA: Diagnosis not present

## 2013-05-15 DIAGNOSIS — M542 Cervicalgia: Secondary | ICD-10-CM | POA: Diagnosis not present

## 2013-05-15 DIAGNOSIS — M5412 Radiculopathy, cervical region: Secondary | ICD-10-CM | POA: Diagnosis not present

## 2013-05-22 DIAGNOSIS — M542 Cervicalgia: Secondary | ICD-10-CM | POA: Diagnosis not present

## 2013-05-22 DIAGNOSIS — M5412 Radiculopathy, cervical region: Secondary | ICD-10-CM | POA: Diagnosis not present

## 2013-05-23 DIAGNOSIS — M25519 Pain in unspecified shoulder: Secondary | ICD-10-CM | POA: Diagnosis not present

## 2013-05-28 DIAGNOSIS — M25519 Pain in unspecified shoulder: Secondary | ICD-10-CM | POA: Diagnosis not present

## 2013-05-28 DIAGNOSIS — M542 Cervicalgia: Secondary | ICD-10-CM | POA: Diagnosis not present

## 2013-05-28 DIAGNOSIS — K2900 Acute gastritis without bleeding: Secondary | ICD-10-CM | POA: Diagnosis not present

## 2013-05-29 ENCOUNTER — Other Ambulatory Visit: Payer: Self-pay | Admitting: Family Medicine

## 2013-05-29 ENCOUNTER — Ambulatory Visit
Admission: RE | Admit: 2013-05-29 | Discharge: 2013-05-29 | Disposition: A | Discharge status: Home / Self Care | Payer: Medicare Other | Source: Ambulatory Visit | Attending: Family Medicine | Admitting: Family Medicine

## 2013-05-29 DIAGNOSIS — R197 Diarrhea, unspecified: Secondary | ICD-10-CM | POA: Diagnosis not present

## 2013-05-29 DIAGNOSIS — R109 Unspecified abdominal pain: Secondary | ICD-10-CM | POA: Diagnosis not present

## 2013-05-29 DIAGNOSIS — K56609 Unspecified intestinal obstruction, unspecified as to partial versus complete obstruction: Secondary | ICD-10-CM

## 2013-05-29 DIAGNOSIS — R112 Nausea with vomiting, unspecified: Secondary | ICD-10-CM | POA: Diagnosis not present

## 2013-05-29 DIAGNOSIS — R935 Abnormal findings on diagnostic imaging of other abdominal regions, including retroperitoneum: Secondary | ICD-10-CM | POA: Diagnosis not present

## 2013-06-01 DIAGNOSIS — R030 Elevated blood-pressure reading, without diagnosis of hypertension: Secondary | ICD-10-CM | POA: Diagnosis not present

## 2013-06-01 DIAGNOSIS — R197 Diarrhea, unspecified: Secondary | ICD-10-CM | POA: Diagnosis not present

## 2013-06-01 DIAGNOSIS — R109 Unspecified abdominal pain: Secondary | ICD-10-CM | POA: Diagnosis not present

## 2013-06-05 DIAGNOSIS — N411 Chronic prostatitis: Secondary | ICD-10-CM | POA: Diagnosis not present

## 2013-06-05 DIAGNOSIS — R300 Dysuria: Secondary | ICD-10-CM | POA: Diagnosis not present

## 2013-06-06 ENCOUNTER — Other Ambulatory Visit: Payer: Self-pay | Admitting: Family Medicine

## 2013-06-06 DIAGNOSIS — K5900 Constipation, unspecified: Secondary | ICD-10-CM | POA: Diagnosis not present

## 2013-06-06 DIAGNOSIS — R32 Unspecified urinary incontinence: Secondary | ICD-10-CM | POA: Diagnosis not present

## 2013-06-06 DIAGNOSIS — M545 Low back pain, unspecified: Secondary | ICD-10-CM

## 2013-06-06 DIAGNOSIS — IMO0002 Reserved for concepts with insufficient information to code with codable children: Secondary | ICD-10-CM | POA: Diagnosis not present

## 2013-06-07 ENCOUNTER — Ambulatory Visit
Admission: RE | Admit: 2013-06-07 | Discharge: 2013-06-07 | Disposition: A | Discharge status: Home / Self Care | Payer: Medicare Other | Source: Ambulatory Visit | Attending: Family Medicine | Admitting: Family Medicine

## 2013-06-07 DIAGNOSIS — M47817 Spondylosis without myelopathy or radiculopathy, lumbosacral region: Secondary | ICD-10-CM | POA: Diagnosis not present

## 2013-06-07 DIAGNOSIS — M545 Low back pain, unspecified: Secondary | ICD-10-CM

## 2013-06-07 DIAGNOSIS — R32 Unspecified urinary incontinence: Secondary | ICD-10-CM

## 2013-06-08 DIAGNOSIS — IMO0002 Reserved for concepts with insufficient information to code with codable children: Secondary | ICD-10-CM | POA: Diagnosis not present

## 2013-06-08 DIAGNOSIS — K5900 Constipation, unspecified: Secondary | ICD-10-CM | POA: Diagnosis not present

## 2013-06-08 DIAGNOSIS — N318 Other neuromuscular dysfunction of bladder: Secondary | ICD-10-CM | POA: Diagnosis not present

## 2013-06-10 ENCOUNTER — Ambulatory Visit
Admission: RE | Admit: 2013-06-10 | Discharge: 2013-06-10 | Disposition: A | Discharge status: Home / Self Care | Payer: Medicare Other | Source: Ambulatory Visit | Attending: Family Medicine | Admitting: Family Medicine

## 2013-06-10 DIAGNOSIS — R32 Unspecified urinary incontinence: Secondary | ICD-10-CM

## 2013-06-10 DIAGNOSIS — M48061 Spinal stenosis, lumbar region without neurogenic claudication: Secondary | ICD-10-CM | POA: Diagnosis not present

## 2013-06-10 DIAGNOSIS — M545 Low back pain, unspecified: Secondary | ICD-10-CM

## 2013-06-10 DIAGNOSIS — M47817 Spondylosis without myelopathy or radiculopathy, lumbosacral region: Secondary | ICD-10-CM | POA: Diagnosis not present

## 2013-06-11 DIAGNOSIS — M1710 Unilateral primary osteoarthritis, unspecified knee: Secondary | ICD-10-CM | POA: Diagnosis not present

## 2013-06-11 DIAGNOSIS — IMO0002 Reserved for concepts with insufficient information to code with codable children: Secondary | ICD-10-CM | POA: Diagnosis not present

## 2013-06-11 DIAGNOSIS — N318 Other neuromuscular dysfunction of bladder: Secondary | ICD-10-CM | POA: Diagnosis not present

## 2013-06-13 DIAGNOSIS — M48061 Spinal stenosis, lumbar region without neurogenic claudication: Secondary | ICD-10-CM | POA: Diagnosis not present

## 2013-06-13 DIAGNOSIS — M7130 Other bursal cyst, unspecified site: Secondary | ICD-10-CM | POA: Diagnosis not present

## 2013-06-13 DIAGNOSIS — Z6834 Body mass index (BMI) 34.0-34.9, adult: Secondary | ICD-10-CM | POA: Diagnosis not present

## 2013-06-13 DIAGNOSIS — R030 Elevated blood-pressure reading, without diagnosis of hypertension: Secondary | ICD-10-CM | POA: Diagnosis not present

## 2013-06-15 DIAGNOSIS — IMO0002 Reserved for concepts with insufficient information to code with codable children: Secondary | ICD-10-CM | POA: Diagnosis not present

## 2013-07-16 DIAGNOSIS — F330 Major depressive disorder, recurrent, mild: Secondary | ICD-10-CM | POA: Diagnosis not present

## 2013-07-16 DIAGNOSIS — K219 Gastro-esophageal reflux disease without esophagitis: Secondary | ICD-10-CM | POA: Diagnosis not present

## 2013-07-16 DIAGNOSIS — M545 Low back pain, unspecified: Secondary | ICD-10-CM | POA: Diagnosis not present

## 2013-07-24 DIAGNOSIS — C61 Malignant neoplasm of prostate: Secondary | ICD-10-CM | POA: Diagnosis not present

## 2013-07-31 DIAGNOSIS — C61 Malignant neoplasm of prostate: Secondary | ICD-10-CM | POA: Diagnosis not present

## 2013-07-31 DIAGNOSIS — R300 Dysuria: Secondary | ICD-10-CM | POA: Diagnosis not present

## 2013-08-28 DIAGNOSIS — F330 Major depressive disorder, recurrent, mild: Secondary | ICD-10-CM | POA: Diagnosis not present

## 2013-08-28 DIAGNOSIS — K219 Gastro-esophageal reflux disease without esophagitis: Secondary | ICD-10-CM | POA: Diagnosis not present

## 2013-08-28 DIAGNOSIS — E782 Mixed hyperlipidemia: Secondary | ICD-10-CM | POA: Diagnosis not present

## 2013-09-13 DIAGNOSIS — S7000XA Contusion of unspecified hip, initial encounter: Secondary | ICD-10-CM | POA: Diagnosis not present

## 2013-11-02 DIAGNOSIS — M25559 Pain in unspecified hip: Secondary | ICD-10-CM | POA: Diagnosis not present

## 2013-11-05 ENCOUNTER — Ambulatory Visit
Admission: RE | Admit: 2013-11-05 | Discharge: 2013-11-05 | Disposition: A | Discharge status: Home / Self Care | Payer: Medicare Other | Source: Ambulatory Visit | Attending: Family Medicine | Admitting: Family Medicine

## 2013-11-05 ENCOUNTER — Other Ambulatory Visit: Payer: Self-pay | Admitting: Family Medicine

## 2013-11-05 DIAGNOSIS — M1610 Unilateral primary osteoarthritis, unspecified hip: Secondary | ICD-10-CM | POA: Diagnosis not present

## 2013-11-05 DIAGNOSIS — M25552 Pain in left hip: Secondary | ICD-10-CM

## 2013-11-05 DIAGNOSIS — M169 Osteoarthritis of hip, unspecified: Secondary | ICD-10-CM | POA: Diagnosis not present

## 2013-11-07 ENCOUNTER — Ambulatory Visit: Payer: Medicare Other | Attending: Family Medicine | Admitting: Physical Therapy

## 2013-11-07 DIAGNOSIS — IMO0001 Reserved for inherently not codable concepts without codable children: Secondary | ICD-10-CM | POA: Diagnosis not present

## 2013-11-07 DIAGNOSIS — M25559 Pain in unspecified hip: Secondary | ICD-10-CM | POA: Insufficient documentation

## 2013-11-07 DIAGNOSIS — R5381 Other malaise: Secondary | ICD-10-CM | POA: Diagnosis not present

## 2013-11-12 ENCOUNTER — Ambulatory Visit: Payer: Medicare Other | Admitting: Physical Therapy

## 2013-11-13 ENCOUNTER — Ambulatory Visit: Payer: Medicare Other | Attending: Family Medicine

## 2013-11-13 DIAGNOSIS — M25559 Pain in unspecified hip: Secondary | ICD-10-CM | POA: Insufficient documentation

## 2013-11-13 DIAGNOSIS — IMO0001 Reserved for inherently not codable concepts without codable children: Secondary | ICD-10-CM | POA: Insufficient documentation

## 2013-11-13 DIAGNOSIS — R5381 Other malaise: Secondary | ICD-10-CM | POA: Insufficient documentation

## 2013-11-20 ENCOUNTER — Ambulatory Visit: Payer: Medicare Other

## 2013-11-20 DIAGNOSIS — IMO0001 Reserved for inherently not codable concepts without codable children: Secondary | ICD-10-CM | POA: Diagnosis not present

## 2013-11-22 ENCOUNTER — Ambulatory Visit: Payer: Medicare Other

## 2013-11-22 DIAGNOSIS — IMO0001 Reserved for inherently not codable concepts without codable children: Secondary | ICD-10-CM | POA: Diagnosis not present

## 2013-11-26 DIAGNOSIS — H02829 Cysts of unspecified eye, unspecified eyelid: Secondary | ICD-10-CM | POA: Diagnosis not present

## 2013-11-26 DIAGNOSIS — H2510 Age-related nuclear cataract, unspecified eye: Secondary | ICD-10-CM | POA: Diagnosis not present

## 2013-11-26 DIAGNOSIS — H5200 Hypermetropia, unspecified eye: Secondary | ICD-10-CM | POA: Diagnosis not present

## 2013-11-26 DIAGNOSIS — H25019 Cortical age-related cataract, unspecified eye: Secondary | ICD-10-CM | POA: Diagnosis not present

## 2013-11-27 ENCOUNTER — Ambulatory Visit: Payer: Medicare Other

## 2013-11-28 ENCOUNTER — Ambulatory Visit: Payer: Medicare Other | Admitting: Physical Therapy

## 2013-12-03 DIAGNOSIS — J069 Acute upper respiratory infection, unspecified: Secondary | ICD-10-CM | POA: Diagnosis not present

## 2013-12-03 DIAGNOSIS — H6120 Impacted cerumen, unspecified ear: Secondary | ICD-10-CM | POA: Diagnosis not present

## 2013-12-03 DIAGNOSIS — N399 Disorder of urinary system, unspecified: Secondary | ICD-10-CM | POA: Diagnosis not present

## 2013-12-03 DIAGNOSIS — E782 Mixed hyperlipidemia: Secondary | ICD-10-CM | POA: Diagnosis not present

## 2013-12-03 DIAGNOSIS — C61 Malignant neoplasm of prostate: Secondary | ICD-10-CM | POA: Diagnosis not present

## 2013-12-04 ENCOUNTER — Ambulatory Visit: Payer: Medicare Other

## 2013-12-10 DIAGNOSIS — Z23 Encounter for immunization: Secondary | ICD-10-CM | POA: Diagnosis not present

## 2013-12-11 ENCOUNTER — Ambulatory Visit: Payer: Medicare Other

## 2013-12-13 ENCOUNTER — Ambulatory Visit: Payer: Medicare Other | Admitting: Physical Therapy

## 2013-12-18 ENCOUNTER — Ambulatory Visit: Payer: Medicare Other

## 2014-01-31 DIAGNOSIS — Z23 Encounter for immunization: Secondary | ICD-10-CM | POA: Diagnosis not present

## 2014-01-31 DIAGNOSIS — J309 Allergic rhinitis, unspecified: Secondary | ICD-10-CM | POA: Diagnosis not present

## 2014-01-31 DIAGNOSIS — K219 Gastro-esophageal reflux disease without esophagitis: Secondary | ICD-10-CM | POA: Diagnosis not present

## 2014-01-31 DIAGNOSIS — R7301 Impaired fasting glucose: Secondary | ICD-10-CM | POA: Diagnosis not present

## 2014-01-31 DIAGNOSIS — Z79899 Other long term (current) drug therapy: Secondary | ICD-10-CM | POA: Diagnosis not present

## 2014-01-31 DIAGNOSIS — M25559 Pain in unspecified hip: Secondary | ICD-10-CM | POA: Diagnosis not present

## 2014-01-31 DIAGNOSIS — E782 Mixed hyperlipidemia: Secondary | ICD-10-CM | POA: Diagnosis not present

## 2014-02-28 DIAGNOSIS — R310 Gross hematuria: Secondary | ICD-10-CM | POA: Diagnosis not present

## 2014-02-28 DIAGNOSIS — C61 Malignant neoplasm of prostate: Secondary | ICD-10-CM | POA: Diagnosis not present

## 2014-02-28 DIAGNOSIS — N411 Chronic prostatitis: Secondary | ICD-10-CM | POA: Diagnosis not present

## 2014-03-18 ENCOUNTER — Emergency Department (HOSPITAL_COMMUNITY): Payer: Medicare Other

## 2014-03-18 ENCOUNTER — Encounter (HOSPITAL_COMMUNITY): Payer: Self-pay | Admitting: Cardiology

## 2014-03-18 ENCOUNTER — Observation Stay (HOSPITAL_COMMUNITY)
Admission: EM | Admit: 2014-03-18 | Discharge: 2014-03-20 | Disposition: A | Discharge status: Home / Self Care | Payer: Medicare Other | Attending: Internal Medicine | Admitting: Internal Medicine

## 2014-03-18 DIAGNOSIS — Z952 Presence of prosthetic heart valve: Secondary | ICD-10-CM

## 2014-03-18 DIAGNOSIS — R0600 Dyspnea, unspecified: Secondary | ICD-10-CM

## 2014-03-18 DIAGNOSIS — R0602 Shortness of breath: Secondary | ICD-10-CM | POA: Diagnosis not present

## 2014-03-18 DIAGNOSIS — E785 Hyperlipidemia, unspecified: Secondary | ICD-10-CM | POA: Diagnosis present

## 2014-03-18 DIAGNOSIS — R0789 Other chest pain: Principal | ICD-10-CM | POA: Insufficient documentation

## 2014-03-18 DIAGNOSIS — Z954 Presence of other heart-valve replacement: Secondary | ICD-10-CM | POA: Diagnosis not present

## 2014-03-18 DIAGNOSIS — I959 Hypotension, unspecified: Secondary | ICD-10-CM | POA: Insufficient documentation

## 2014-03-18 DIAGNOSIS — R030 Elevated blood-pressure reading, without diagnosis of hypertension: Secondary | ICD-10-CM | POA: Diagnosis not present

## 2014-03-18 DIAGNOSIS — Z79899 Other long term (current) drug therapy: Secondary | ICD-10-CM | POA: Insufficient documentation

## 2014-03-18 DIAGNOSIS — E669 Obesity, unspecified: Secondary | ICD-10-CM | POA: Diagnosis present

## 2014-03-18 DIAGNOSIS — K219 Gastro-esophageal reflux disease without esophagitis: Secondary | ICD-10-CM | POA: Diagnosis present

## 2014-03-18 DIAGNOSIS — R7989 Other specified abnormal findings of blood chemistry: Secondary | ICD-10-CM

## 2014-03-18 DIAGNOSIS — R011 Cardiac murmur, unspecified: Secondary | ICD-10-CM | POA: Diagnosis not present

## 2014-03-18 DIAGNOSIS — C61 Malignant neoplasm of prostate: Secondary | ICD-10-CM | POA: Diagnosis present

## 2014-03-18 DIAGNOSIS — I350 Nonrheumatic aortic (valve) stenosis: Secondary | ICD-10-CM | POA: Diagnosis not present

## 2014-03-18 DIAGNOSIS — I200 Unstable angina: Secondary | ICD-10-CM

## 2014-03-18 DIAGNOSIS — R51 Headache: Secondary | ICD-10-CM | POA: Diagnosis not present

## 2014-03-18 DIAGNOSIS — R0609 Other forms of dyspnea: Secondary | ICD-10-CM

## 2014-03-18 DIAGNOSIS — Z87891 Personal history of nicotine dependence: Secondary | ICD-10-CM | POA: Diagnosis not present

## 2014-03-18 DIAGNOSIS — N3020 Other chronic cystitis without hematuria: Secondary | ICD-10-CM | POA: Diagnosis present

## 2014-03-18 DIAGNOSIS — R079 Chest pain, unspecified: Secondary | ICD-10-CM | POA: Diagnosis present

## 2014-03-18 DIAGNOSIS — I719 Aortic aneurysm of unspecified site, without rupture: Secondary | ICD-10-CM | POA: Insufficient documentation

## 2014-03-18 HISTORY — DX: Other chronic cystitis without hematuria: N30.20

## 2014-03-18 HISTORY — DX: Hematuria, unspecified: R31.9

## 2014-03-18 HISTORY — DX: Other forms of dyspnea: R06.09

## 2014-03-18 HISTORY — DX: Obesity, unspecified: E66.9

## 2014-03-18 HISTORY — DX: Presence of prosthetic heart valve: Z95.2

## 2014-03-18 HISTORY — DX: Hyperlipidemia, unspecified: E78.5

## 2014-03-18 HISTORY — DX: Dyspnea, unspecified: R06.00

## 2014-03-18 HISTORY — DX: Malignant neoplasm of prostate: C61

## 2014-03-18 LAB — BASIC METABOLIC PANEL
Anion gap: 4 — ABNORMAL LOW (ref 5–15)
BUN: 10 mg/dL (ref 6–23)
CALCIUM: 8.8 mg/dL (ref 8.4–10.5)
CO2: 31 mmol/L (ref 19–32)
CREATININE: 1.05 mg/dL (ref 0.50–1.35)
Chloride: 102 mEq/L (ref 96–112)
GFR calc Af Amer: 81 mL/min — ABNORMAL LOW (ref >90)
GFR, EST NON AFRICAN AMERICAN: 70 mL/min — AB (ref >90)
GLUCOSE: 93 mg/dL (ref 70–99)
Potassium: 4 mmol/L (ref 3.5–5.1)
SODIUM: 137 mmol/L (ref 135–145)

## 2014-03-18 LAB — BRAIN NATRIURETIC PEPTIDE: B Natriuretic Peptide: 101.5 pg/mL — ABNORMAL HIGH (ref 0.0–100.0)

## 2014-03-18 LAB — CBC
HCT: 41.9 % (ref 39.0–52.0)
HEMOGLOBIN: 14.7 g/dL (ref 13.0–17.0)
MCH: 29.9 pg (ref 26.0–34.0)
MCHC: 35.1 g/dL (ref 30.0–36.0)
MCV: 85.2 fL (ref 78.0–100.0)
PLATELETS: 165 10*3/uL (ref 150–400)
RBC: 4.92 MIL/uL (ref 4.22–5.81)
RDW: 12.6 % (ref 11.5–15.5)
WBC: 5.5 10*3/uL (ref 4.0–10.5)

## 2014-03-18 LAB — LIPID PANEL
CHOL/HDL RATIO: 4.2 ratio
Cholesterol: 159 mg/dL (ref 0–200)
HDL: 38 mg/dL — ABNORMAL LOW (ref >39)
LDL Cholesterol: 93 mg/dL (ref 0–99)
Triglycerides: 140 mg/dL (ref <150)
VLDL: 28 mg/dL (ref 0–40)

## 2014-03-18 LAB — I-STAT TROPONIN, ED: TROPONIN I, POC: 0.01 ng/mL (ref 0.00–0.08)

## 2014-03-18 LAB — TROPONIN I: Troponin I: <0.03 ng/mL (ref <0.031)

## 2014-03-18 LAB — D-DIMER, QUANTITATIVE (NOT AT ARMC): D DIMER QUANT: 1.54 ug{FEU}/mL — AB (ref 0.00–0.48)

## 2014-03-18 MED ORDER — ASPIRIN EC 81 MG PO TBEC
81.0000 mg | DELAYED_RELEASE_TABLET | Freq: Every day | ORAL | Status: DC
  Administered 2014-03-19 – 2014-03-20 (×2): 81 mg via ORAL
  Filled 2014-03-18 (×2): qty 1

## 2014-03-18 MED ORDER — FINASTERIDE 5 MG PO TABS
5.0000 mg | ORAL_TABLET | Freq: Every day | ORAL | Status: DC
  Administered 2014-03-19 – 2014-03-20 (×2): 5 mg via ORAL
  Filled 2014-03-18 (×2): qty 1

## 2014-03-18 MED ORDER — GI COCKTAIL ~~LOC~~
30.0000 mL | Freq: Four times a day (QID) | ORAL | Status: DC | PRN
Start: 2014-03-18 — End: 2014-03-20

## 2014-03-18 MED ORDER — PANTOPRAZOLE SODIUM 40 MG PO TBEC
40.0000 mg | DELAYED_RELEASE_TABLET | Freq: Every day | ORAL | Status: DC
  Administered 2014-03-19 – 2014-03-20 (×2): 40 mg via ORAL
  Filled 2014-03-18 (×2): qty 1

## 2014-03-18 MED ORDER — NITROGLYCERIN 0.4 MG SL SUBL: 0.4000 mg | SUBLINGUAL_TABLET | SUBLINGUAL | Status: DC | PRN

## 2014-03-18 MED ORDER — DOCUSATE SODIUM 100 MG PO CAPS
100.0000 mg | ORAL_CAPSULE | Freq: Every day | ORAL | Status: DC
  Administered 2014-03-19 – 2014-03-20 (×2): 100 mg via ORAL
  Filled 2014-03-18 (×2): qty 1

## 2014-03-18 MED ORDER — ACETAMINOPHEN 325 MG PO TABS: 650.0000 mg | ORAL_TABLET | ORAL | Status: DC | PRN

## 2014-03-18 MED ORDER — ONDANSETRON HCL 4 MG/2ML IJ SOLN: 4.0000 mg | Freq: Four times a day (QID) | INTRAMUSCULAR | Status: DC | PRN

## 2014-03-18 MED ORDER — ENOXAPARIN SODIUM 40 MG/0.4ML ~~LOC~~ SOLN
40.0000 mg | SUBCUTANEOUS | Status: DC
  Administered 2014-03-18: 40 mg via SUBCUTANEOUS
  Filled 2014-03-18 (×2): qty 0.4

## 2014-03-18 MED ORDER — ENOXAPARIN SODIUM 40 MG/0.4ML ~~LOC~~ SOLN: 40.0000 mg | SUBCUTANEOUS | Status: DC

## 2014-03-18 MED ORDER — ASPIRIN 81 MG PO CHEW
324.0000 mg | CHEWABLE_TABLET | Freq: Once | ORAL | Status: AC
  Administered 2014-03-18: 324 mg via ORAL
  Filled 2014-03-18: qty 4

## 2014-03-18 MED ORDER — MORPHINE SULFATE 2 MG/ML IJ SOLN: 1.0000 mg | INTRAMUSCULAR | Status: DC | PRN

## 2014-03-18 MED ORDER — SIMVASTATIN 20 MG PO TABS
20.0000 mg | ORAL_TABLET | Freq: Every day | ORAL | Status: DC
  Administered 2014-03-19 – 2014-03-20 (×2): 20 mg via ORAL
  Filled 2014-03-18 (×2): qty 1

## 2014-03-18 MED ORDER — NITROGLYCERIN 0.3 MG SL SUBL
0.3000 mg | SUBLINGUAL_TABLET | SUBLINGUAL | Status: DC | PRN
  Filled 2014-03-18: qty 100

## 2014-03-18 MED ORDER — TAMSULOSIN HCL 0.4 MG PO CAPS
0.4000 mg | ORAL_CAPSULE | Freq: Every day | ORAL | Status: DC
  Administered 2014-03-19 – 2014-03-20 (×2): 0.4 mg via ORAL
  Filled 2014-03-18 (×2): qty 1

## 2014-03-18 NOTE — ED Provider Notes (Signed)
CSN: [id number]     Arrival date & time 03/18/14  1416 History   First MD Initiated Contact with Patient 03/18/14 1502     Chief Complaint  Patient presents with  . Chest Pain     (Consider location/radiation/quality/duration/timing/severity/associated sxs/prior Treatment) Patient is a 69 y.o. male presenting with chest pain.  Chest Pain Pain location:  L chest and substernal area Pain quality: tightness   Pain radiates to:  Does not radiate Pain radiates to the back: no   Pain severity:  Mild Onset quality:  Gradual Duration:  1 day Progression:  Worsening (had tigtness on exertion for years, last night present at rest) Relieved by:  Nitroglycerin and rest Worsened by:  Exertion and movement Ineffective treatments:  None tried Associated symptoms: headache (pressure, mild) and shortness of breath   Associated symptoms: no abdominal pain, no back pain, no cough, no diaphoresis, no fever, no nausea, not vomiting and no weakness   Risk factors: male sex   Risk factors: no coronary artery disease, no diabetes mellitus, no high cholesterol, no hypertension, no prior DVT/PE and no smoking   Risk factors comment:  67yrs ago, family hx 23s CAD   Past Medical History  Diagnosis Date  . Hypotension, unspecified   . Dizziness and giddiness   . Heart murmur   . Aortic stenosis 12/11/09  . Aortic aneurysm 12/11/09   Past Surgical History  Procedure Laterality Date  . Tissue aortic valve replacement  12/11/09  . 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 Cholecystectomy  2010  . Colon surgery  2010    colon resection  . Doppler carotid  2012  . Doppler echocardiography  2011   Family History  Problem Relation Age of Onset  . Heart disease Father 75  . Stroke Mother 43   History  Substance Use Topics  . Smoking status: Former Smoker    Types: Cigarettes    Quit date:  03/16/1967  . Smokeless tobacco: Not on file  . Alcohol Use: No    Review of Systems  Constitutional: Negative for fever and diaphoresis.  HENT: Negative for sore throat.   Eyes: Negative for visual disturbance.  Respiratory: Positive for shortness of breath. Negative for cough.   Cardiovascular: Positive for chest pain.  Gastrointestinal: Negative for nausea, vomiting and abdominal pain.  Genitourinary: Negative for difficulty urinating.  Musculoskeletal: Negative for back pain and neck stiffness.  Skin: Negative for rash.  Neurological: Positive for headaches (pressure, mild). Negative for syncope and weakness.      Allergies  Iodinated diagnostic agents  Home Medications   Prior to Admission medications   Medication Sig Start Date End Date Taking? Authorizing Provider  acetaminophen (TYLENOL) 500 MG tablet Take 500 mg by mouth every 6 (six) hours as needed for mild pain.   Yes Historical Provider, MD  docusate sodium (COLACE) 100 MG capsule Take 100 mg by mouth daily.   Yes Historical Provider, MD  finasteride (PROSCAR) 5 MG tablet Take 5 mg by mouth daily.   Yes Historical Provider, MD  lansoprazole (PREVACID) 15 MG capsule Take 15 mg by mouth daily at 12 noon.   Yes Historical Provider, MD  simvastatin (ZOCOR) 20 MG tablet Take 20 mg by mouth daily.    Yes Historical Provider, MD  Tamsulosin HCl (FLOMAX PO) Take 0.4 mg by mouth daily.   Yes Historical Provider,  MD  sildenafil (VIAGRA) 100 MG tablet Take 1 tablet (100 mg total) by mouth daily as needed for erectile dysfunction. 01/18/13   Thayer Headings, MD   BP 132/73 mmHg  Pulse 63  Temp(Src) 97.7 F (36.5 C) (Oral)  Resp 18  Ht 5\' 9"  (1.753 m)  Wt 255 lb (115.667 kg)  BMI 37.64 kg/m2  SpO2 98% Physical Exam  Constitutional: He is oriented to person, place, and time. He appears well-developed and well-nourished. No distress.  HENT:  Head: Normocephalic and atraumatic.  Eyes: Conjunctivae and EOM are normal.   Neck: Normal range of motion.  Cardiovascular: Normal rate, regular rhythm, normal heart sounds and intact distal pulses.  Exam reveals no gallop and no friction rub.   No murmur heard. Pulmonary/Chest: Effort normal and breath sounds normal. No respiratory distress. He has no wheezes. He has no rales.  Abdominal: Soft. He exhibits no distension. There is no tenderness. There is no guarding.  Musculoskeletal: He exhibits no edema.  Neurological: He is alert and oriented to person, place, and time.  Skin: Skin is warm and dry. He is not diaphoretic.  Nursing note and vitals reviewed.   ED Course  Procedures (including critical care time) Labs Review Labs Reviewed  BASIC METABOLIC PANEL - Abnormal; Notable for the following:    GFR calc non Af Amer 70 (*)    GFR calc Af Amer 81 (*)    Anion gap 4 (*)    All other components within normal limits  BRAIN NATRIURETIC PEPTIDE - Abnormal; Notable for the following:    B Natriuretic Peptide 101.5 (*)    All other components within normal limits  D-DIMER, QUANTITATIVE - Abnormal; Notable for the following:    D-Dimer, Quant 1.54 (*)    All other components within normal limits  LIPID PANEL - Abnormal; Notable for the following:    HDL 38 (*)    All other components within normal limits  CBC  TROPONIN I  TROPONIN I  TROPONIN I  I-STAT TROPOININ, ED    Imaging Review Dg Chest 2 View  03/18/2014   CLINICAL DATA:  Shortness of breath and chest tightness.  EXAM: CHEST  2 VIEW  COMPARISON:  CT chest 12/22/2011.  PA and lateral chest 12/27/2012.  FINDINGS: The lungs are clear. Heart size is normal. No pneumothorax or pleural effusion. The patient is status post aortic valve replacement.  IMPRESSION: No acute disease.   Electronically Signed   By: Inge Rise M.D.   On: 03/18/2014 15:08     EKG Interpretation   Date/Time:  Monday March 18 2014 14:22:40 EST Ventricular Rate:  63 PR Interval:  176 QRS Duration: 96 QT Interval:   466 QTC Calculation: 476 R Axis:   25 Text Interpretation:  Normal sinus rhythm Normal ECG No significant change  since last tracing Confirmed by Winfred Leeds  MD, SAM [id number]) on 03/18/2014  3:00:42 PM      MDM   Final diagnoses:  Chest pain, unspecified chest pain type  S/P AVR (aortic valve replacement)    69 year old male with a history of aortic stenosis stenosis status post AVR, family history of CAD who presents with concern for shortness of breath and chest tightness. Differential diagnosis includes angina, ACS, CHF exacerbation, pneumonia, aortic dissection, pulmonary embolus. Have low suspicion for aortic dissection, pneumonia, or pulmonary embolus based off of history, physical exam and x-ray findings.  EKG shows a normal sinus rhythm without any acute ST changes.  I-STAT troponin  is negative. CBC shows normal blood counts.  Patient is high risk given age, description of symptoms, and strong family history. He has not had any recent stress test and is appropriate for further inpatient cardiac evaluation.  He was given aspirin 324mg .  Nitroglycerin at PCP significantly helped pain.  Pt admitted in stable condition to hospitalist for further evaluation.   Alvino Chapel, MD 03/19/14 0157  Orlie Dakin, MD 03/20/14 [phone]

## 2014-03-18 NOTE — ED Notes (Signed)
Pt reports that he started having chest pain this morning. Reports that he was given 1 nitro at the PCP office this morning and the nitro helped the pain.

## 2014-03-18 NOTE — ED Provider Notes (Signed)
Complains of chest discomfort described as anterior tightness or indigestion onset morning at rest. He saw his primary care physician this morning. Treated with one sublingual nitroglycerin with relief. Presently asymptomatic. Patient does report dyspnea on exertion, walking up 1 flight of steps for the past several months, essentially unchanged. No other associated symptoms  Orlie Dakin, MD 03/18/14 2356

## 2014-03-18 NOTE — H&P (Signed)
History and Physical       Hospital Admission Note Date: 03/18/2014  Patient name: Jeff Johnson Medical record number: [id number] Date of birth: [birth year] Age: 69 y.o. Gender: male  PCP: DEWEY,ELIZABETH, MD    Chief Complaint:  Chest pain with shortness of breath  HPI: Patient is a 69 year old male with BPH, GERD, history of aortic valve replacement in 2011 presented to ED with intermittent chest pains and dyspnea on exertion. History was obtained from the patient and his wife, patient reported that he's been having dyspnea on exertion in the last 1 month, progressively worsening especially with the flight of stairs. He sleeps on 2 pillows, has gained about 10 pounds in last 1 month. Denies any peripheral edema. Had chest comfort today after eating breakfast around 8:30 in the morning described as chest tightness otherwise no nausea, diaphoresis or vomiting or any numbness or tingling in the arms. Patient's symptoms improved with one sublingual nitroglycerin and aspirin. EKG with no acute ST-T wave changes, troponin 1 negative.  Review of Systems:  Constitutional: Denies fever, chills, diaphoresis, poor appetite and fatigue.  HEENT: Denies photophobia, eye pain, redness, hearing loss, ear pain, congestion, sore throat, rhinorrhea, sneezing, mouth sores, trouble swallowing, neck pain, neck stiffness and tinnitus.   Respiratory: Please see history of present illness Cardiovascular: Please see history of present illness  Gastrointestinal: Denies nausea, vomiting, abdominal pain, diarrhea, constipation, blood in stool and abdominal distention.  Genitourinary: Denies dysuria, urgency, frequency, hematuria, flank pain and difficulty urinating.  Musculoskeletal: Denies myalgias, back pain, joint swelling, arthralgias and gait problem.  Skin: Denies pallor, rash and wound.  Neurological: Denies dizziness, seizures, syncope, weakness,  light-headedness, numbness and headaches.  Hematological: Denies adenopathy. Easy bruising, personal or family bleeding history  Psychiatric/Behavioral: Denies suicidal ideation, mood changes, confusion, nervousness, sleep disturbance and agitation  Past Medical History: Past Medical History  Diagnosis Date  . Hypotension, unspecified   . Dizziness and giddiness   . Heart murmur   . Aortic stenosis 12/11/09  . Aortic aneurysm 12/11/09   Past Surgical History  Procedure Laterality Date  . Tissue aortic valve replacement  12/11/09  . 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 Cholecystectomy  2010  . Colon surgery  2010    colon resection  . Doppler carotid  2012  . Doppler echocardiography  2011    Medications: Prior to Admission medications   Medication Sig Start Date End Date Taking? Authorizing Provider  acetaminophen (TYLENOL) 500 MG tablet Take 500 mg by mouth every 6 (six) hours as needed for mild pain.   Yes Historical Provider, MD  docusate sodium (COLACE) 100 MG capsule Take 100 mg by mouth daily.   Yes Historical Provider, MD  finasteride (PROSCAR) 5 MG tablet Take 5 mg by mouth daily.   Yes Historical Provider, MD  lansoprazole (PREVACID) 15 MG capsule Take 15 mg by mouth daily at 12 noon.   Yes Historical Provider, MD  simvastatin (ZOCOR) 20 MG tablet Take 20 mg by mouth daily.    Yes Historical Provider, MD  Tamsulosin HCl (FLOMAX PO) Take 0.4 mg by mouth daily.   Yes Historical Provider, MD  sildenafil (VIAGRA) 100 MG tablet Take 1 tablet (100 mg total) by mouth daily as needed for erectile dysfunction. 01/18/13   Thayer Headings, MD    Allergies:   Allergies  Allergen Reactions  .  Iodinated Diagnostic Agents Anaphylaxis    Social History:  reports that he quit smoking about 47 years ago. His smoking use included Cigarettes. He smoked 0.00 packs per day. He  does not have any smokeless tobacco history on file. He reports that he does not drink alcohol. His drug history is not on file.  Family History: Family History  Problem Relation Age of Onset  . Heart disease Father 18  . Stroke Mother 3    Physical Exam: Blood pressure 145/80, pulse 59, temperature 97.4 F (36.3 C), temperature source Oral, resp. rate 12, height 5\' 9"  (1.753 m), weight 115.667 kg (255 lb), SpO2 98 %. General: Alert, awake, oriented x3, in no acute distress. HEENT: normocephalic, atraumatic, anicteric sclera, pink conjunctiva, pupils equal and reactive to light and accomodation, oropharynx clear Neck: supple, no masses or lymphadenopathy, no goiter, no bruits  Heart: Regular rate and rhythm, without murmurs, rubs or gallops. Lungs: Clear to auscultation bilaterally, no wheezing, rales or rhonchi. Abdomen: Soft, nontender, nondistended, positive bowel sounds, no masses. Chest and abdominal scar from previous surgeries Extremities: No clubbing, cyanosis or edema with positive pedal pulses. Neuro: Grossly intact, no focal neurological deficits, strength 5/5 upper and lower extremities bilaterally Psych: alert and oriented x 3, normal mood and affect Skin: no rashes or lesions, warm and dry   LABS on Admission:  Basic Metabolic Panel:  Recent Labs Lab 03/18/14 1433  NA 137  K 4.0  CL 102  CO2 31  GLUCOSE 93  BUN 10  CREATININE 1.05  CALCIUM 8.8   Liver Function Tests: No results for input(s): AST, ALT, ALKPHOS, BILITOT, PROT, ALBUMIN in the last 168 hours. No results for input(s): LIPASE, AMYLASE in the last 168 hours. No results for input(s): AMMONIA in the last 168 hours. CBC:  Recent Labs Lab 03/18/14 1433  WBC 5.5  HGB 14.7  HCT 41.9  MCV 85.2  PLT 165   Cardiac Enzymes: No results for input(s): CKTOTAL, CKMB, CKMBINDEX, TROPONINI in the last 168 hours. BNP: Invalid input(s): POCBNP CBG: No results for input(s): GLUCAP in the last 168  hours.   Radiological Exams on Admission: Dg Chest 2 View  03/18/2014   CLINICAL DATA:  Shortness of breath and chest tightness.  EXAM: CHEST  2 VIEW  COMPARISON:  CT chest 12/22/2011.  PA and lateral chest 12/27/2012.  FINDINGS: The lungs are clear. Heart size is normal. No pneumothorax or pleural effusion. The patient is status post aortic valve replacement.  IMPRESSION: No acute disease.   Electronically Signed   By: Inge Rise M.D.   On: 03/18/2014 15:08    Assessment/Plan Principal Problem:   Chest pain: Associated dyspnea on exertion with underlying history of aortic valve replacement, hyperlipidemia, rule out acute ACS - Admit to telemetry, obtain serial cardiac enzymes, d-dimer - Continue aspirin, sublingual nitroglycerin, obtain 2-D echocardiogram - Cardiology consulted, recommended nothing by mouth after midnight, possible stress test in a.m.  Active Problems:   Aortic stenosis, S/P AVR (aortic valve replacement) - Follow 2-D echo    Hyperlipidemia - Continue statin, obtain lipid panel    GERD (gastroesophageal reflux disease) Continue PPI   DVT prophylaxis: Lovenox   CODE STATUS: Full code   Family Communication: Admission, patients condition and plan of care including tests being ordered have been discussed with the patient and  Wife who indicates understanding and agree with the plan and Code Status   Further plan will depend as patient's clinical course evolves and further radiologic and  laboratory data become available.   Time Spent on Admission: 50 mins  RAI,RIPUDEEP M.D. [facility] 03/18/2014, 4:48 PM Pager: [phone]  If 7PM-7AM, please contact night-coverage [email or web address] Password [email or web address]

## 2014-03-18 NOTE — Plan of Care (Signed)
Problem: Phase I Progression Outcomes Goal: Aspirin unless contraindicated Outcome: Completed/Met Date Met:  03/18/14 Given in the ED

## 2014-03-18 NOTE — ED Notes (Signed)
Admitting MD at bedside.

## 2014-03-19 ENCOUNTER — Observation Stay (HOSPITAL_COMMUNITY): Payer: Medicare Other

## 2014-03-19 DIAGNOSIS — R0609 Other forms of dyspnea: Secondary | ICD-10-CM | POA: Diagnosis not present

## 2014-03-19 DIAGNOSIS — E785 Hyperlipidemia, unspecified: Secondary | ICD-10-CM | POA: Diagnosis not present

## 2014-03-19 DIAGNOSIS — I200 Unstable angina: Secondary | ICD-10-CM

## 2014-03-19 DIAGNOSIS — I517 Cardiomegaly: Secondary | ICD-10-CM | POA: Diagnosis not present

## 2014-03-19 DIAGNOSIS — R0789 Other chest pain: Secondary | ICD-10-CM | POA: Diagnosis not present

## 2014-03-19 DIAGNOSIS — I350 Nonrheumatic aortic (valve) stenosis: Secondary | ICD-10-CM | POA: Diagnosis not present

## 2014-03-19 DIAGNOSIS — R079 Chest pain, unspecified: Secondary | ICD-10-CM | POA: Diagnosis not present

## 2014-03-19 DIAGNOSIS — Z954 Presence of other heart-valve replacement: Secondary | ICD-10-CM | POA: Diagnosis not present

## 2014-03-19 LAB — TROPONIN I

## 2014-03-19 MED ORDER — REGADENOSON 0.4 MG/5ML IV SOLN
INTRAVENOUS | Status: AC
  Filled 2014-03-19: qty 5

## 2014-03-19 MED ORDER — PERFLUTREN LIPID MICROSPHERE
1.0000 mL | INTRAVENOUS | Status: AC | PRN
  Administered 2014-03-19: 2 mL via INTRAVENOUS
  Filled 2014-03-19: qty 10

## 2014-03-19 MED ORDER — TECHNETIUM TC 99M SESTAMIBI GENERIC - CARDIOLITE
30.0000 | Freq: Once | INTRAVENOUS | Status: AC | PRN
  Administered 2014-03-19: 30 via INTRAVENOUS

## 2014-03-19 MED ORDER — TECHNETIUM TC 99M SESTAMIBI GENERIC - CARDIOLITE
10.0000 | Freq: Once | INTRAVENOUS | Status: AC | PRN
  Administered 2014-03-19: 10 via INTRAVENOUS

## 2014-03-19 MED ORDER — REGADENOSON 0.4 MG/5ML IV SOLN
0.4000 mg | Freq: Once | INTRAVENOUS | Status: AC
  Administered 2014-03-19: 0.4 mg via INTRAVENOUS

## 2014-03-19 NOTE — Progress Notes (Signed)
Pt reports small amounts of blood in urine. Pt declined lovenox. Will continue to monitor.

## 2014-03-19 NOTE — Progress Notes (Signed)
[facility] PROGRESS NOTE  Jeff Johnson KCL:[id number] DOB: 1945-10-10 DOA: 03/18/2014 PCP: [name], MD  Assessment/Plan:  Principal Problem:   Chest pain: low risk myoview.  Need to r/o PE.  Has anapylaxis with IV contrast. Discussed with Dr. Clovis Riley. Ok to do VQ tomorrow, despite nuclear stress today.  Active Problems:   Aortic stenosis   S/P AVR (aortic valve replacement): echo shows slightly larger aortic root. Defer to cardiology   Dyspnea on exertion   Hyperlipidemia   GERD (gastroesophageal reflux disease)  Code Status:  full Family Communication:  Wife at bedside Disposition Plan:  Home when PE ruled out  HPI/Subjective: No CP. Main complaint is gradually worsening DOE. No leg pain.  Objective: Filed Vitals:   03/19/14 1115  BP: 187/87  Pulse: 62  Temp:   Resp:     Intake/Output Summary (Last 24 hours) at 03/19/14 2017 Last data filed at 03/19/14 1700  Gross per 24 hour  Intake    480 ml  Output      0 ml  Net    480 ml   Filed Weights   03/18/14 1423 03/19/14 0500  Weight: 115.667 kg (255 lb) 114.896 kg (253 lb 4.8 oz)    Exam:   General:  Comfortable, talkative  Cardiovascular: RRR without MGR  Respiratory: CTA without WRR  Abdomen: obese, s, nt, nd  Ext: trace edema  Basic Metabolic Panel:  Recent Labs Lab 03/18/14 1433  NA 137  K 4.0  CL 102  CO2 31  GLUCOSE 93  BUN 10  CREATININE 1.05  CALCIUM 8.8   Liver Function Tests: No results for input(s): AST, ALT, ALKPHOS, BILITOT, PROT, ALBUMIN in the last 168 hours. No results for input(s): LIPASE, AMYLASE in the last 168 hours. No results for input(s): AMMONIA in the last 168 hours. CBC:  Recent Labs Lab 03/18/14 1433  WBC 5.5  HGB 14.7  HCT 41.9  MCV 85.2  PLT 165   Cardiac Enzymes:  Recent Labs Lab 03/18/14 1822 03/18/14 2004 03/18/14 2313  TROPONINI <0.03 <0.03 <0.03   BNP (last 3 results) No results for input(s): PROBNP in the last 8760  hours. CBG: No results for input(s): GLUCAP in the last 168 hours.  No results found for this or any previous visit (from the past 240 hour(s)).   Studies: Dg Chest 2 View  03/18/2014   CLINICAL DATA:  Shortness of breath and chest tightness.  EXAM: CHEST  2 VIEW  COMPARISON:  CT chest 12/22/2011.  PA and lateral chest 12/27/2012.  FINDINGS: The lungs are clear. Heart size is normal. No pneumothorax or pleural effusion. The patient is status post aortic valve replacement.  IMPRESSION: No acute disease.   Electronically Signed   By: Inge Rise M.D.   On: 03/18/2014 15:08   Nm Myocar Multi W/spect W/wall Motion / Ef  03/19/2014   CLINICAL DATA:  Chest pain  EXAM: MYOCARDIAL IMAGING WITH SPECT (REST AND PHARMACOLOGIC-STRESS)  GATED LEFT VENTRICULAR WALL MOTION STUDY  LEFT VENTRICULAR EJECTION FRACTION  TECHNIQUE: Standard myocardial SPECT imaging was performed after resting intravenous injection of 10 mCi Tc-38m sestamibi. Subsequently, intravenous infusion of Lexiscan was performed under the supervision of the Cardiology staff. At peak effect of the drug, 30 mCi Tc-69m sestamibi was injected intravenously and standard myocardial SPECT imaging was performed. Quantitative gated imaging was also performed to evaluate left ventricular wall motion, and estimate left ventricular ejection fraction.  COMPARISON:  None.  FINDINGS: Perfusion: Allowing for attenuation artifact at  the apex, there are no perfusion defects. This is confirmed on the gated slice images.  Wall Motion: There is akinesis of the septum but otherwise normal wall motion.  Left Ventricular Ejection Fraction: 57 %  End diastolic volume 440 ml  End systolic volume 49 ml  IMPRESSION: 1. No reversible ischemia or infarction.  2. There is septal akinesis but otherwise normal wall motion. This is nonspecific.  3. Left ventricular ejection fraction 57%  4. Low-risk stress test findings*.  *2012 Appropriate Use Criteria for Coronary Revascularization  Focused Update: J Am Coll Cardiol. 3474;25(9):563-875. http://content.airportbarriers.com.aspx?articleid=1201161   Electronically Signed   By: Maryclare Bean M.D.   On: 03/19/2014 16:58    Scheduled Meds: . aspirin EC  81 mg Oral Daily  . docusate sodium  100 mg Oral Daily  . enoxaparin (LOVENOX) injection  40 mg Subcutaneous Q24H  . finasteride  5 mg Oral Daily  . pantoprazole  40 mg Oral Daily  . simvastatin  20 mg Oral Q supper  . tamsulosin  0.4 mg Oral Daily   Continuous Infusions:   Time spent: 35 minutes  [facility]  [email or web address], password [facility] 03/19/2014, 8:17 PM  LOS: 1 day

## 2014-03-19 NOTE — Progress Notes (Signed)
Echocardiogram 2D Echocardiogram with Definity has been performed.  Khamiya Varin 03/19/2014, 2:50 PM

## 2014-03-19 NOTE — Progress Notes (Signed)
UR completed 

## 2014-03-19 NOTE — Consult Note (Signed)
CONSULTATION NOTE  Reason for Consult: Chest pain, DOE  Requesting Physician: Dr. Conley Canal  Cardiologist: Dr. Cathie Olden  HPI: This is a 69 y.o. male with a past medical history significant for mild, nonobstructive coronary disease. He also has a history of aortic stenosis or aortic insufficiency status post Bentall procedure in 2011. There is also a history of chronic cystitis and prostate cancer with ongoing hematuria in the setting of aspirin use. He last saw Dr. Acie Fredrickson in 2014. Over the past several weeks she's had progressive shortness of breath with exertion as well as pressure in his chest. His blood pressure yesterday was noted to be elevated over 180/120. He also had headache. The symptoms are new for him and have certainly been worsening over the past several weeks. It should be noted that he stopped aspirin, weeks ago due to hematuria and noted that his hematuria improved however this does seem to coincide with the onset of his chest pain symptoms. EKG shows normal sinus rhythm with no ischemic changes.  PMHx:  Past Medical History  Diagnosis Date  . Hypotension, unspecified   . Dizziness and giddiness   . Heart murmur   . Aortic stenosis 12/11/09  . Aortic aneurysm 12/11/09   Past Surgical History  Procedure Laterality Date  . Tissue aortic valve replacement  12/11/09  . 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 Cholecystectomy  2010  . Colon surgery  2010    colon resection  . Doppler carotid  2012  . Doppler echocardiography  2011    FAMHx: Family History  Problem Relation Age of Onset  . Heart disease Father 79  . Stroke Mother 35    SOCHx:  reports that he quit smoking about 47 years ago. His smoking use included Cigarettes. He smoked 0.00 packs per day. He does not have any smokeless tobacco history on file. He reports that he does not drink alcohol.  His drug history is not on file.  ALLERGIES: Allergies  Allergen Reactions  . Iodinated Diagnostic Agents Anaphylaxis    ROS: A comprehensive review of systems was negative except for: Respiratory: positive for dyspnea on exertion Cardiovascular: positive for chest pain Neurological: positive for headache  HOME MEDICATIONS: Prescriptions prior to admission  Medication Sig Dispense Refill Last Dose  . acetaminophen (TYLENOL) 500 MG tablet Take 500 mg by mouth every 6 (six) hours as needed for mild pain.   03/18/2014 at Unknown time  . docusate sodium (COLACE) 100 MG capsule Take 100 mg by mouth daily.   03/18/2014 at Unknown time  . finasteride (PROSCAR) 5 MG tablet Take 5 mg by mouth daily.   03/18/2014 at Unknown time  . lansoprazole (PREVACID) 15 MG capsule Take 15 mg by mouth daily at 12 noon.   03/18/2014 at Unknown time  . simvastatin (ZOCOR) 20 MG tablet Take 20 mg by mouth daily.    03/18/2014 at Unknown time  . Tamsulosin HCl (FLOMAX PO) Take 0.4 mg by mouth daily.   03/18/2014  . sildenafil (VIAGRA) 100 MG tablet Take 1 tablet (100 mg total) by mouth daily as needed for erectile dysfunction. 15 tablet 3 unknown    HOSPITAL MEDICATIONS: Prior to Admission:  Prescriptions prior to admission  Medication Sig Dispense Refill Last Dose  . acetaminophen (TYLENOL) 500 MG tablet Take 500 mg by mouth every 6 (six) hours as needed for mild  pain.   03/18/2014 at Unknown time  . docusate sodium (COLACE) 100 MG capsule Take 100 mg by mouth daily.   03/18/2014 at Unknown time  . finasteride (PROSCAR) 5 MG tablet Take 5 mg by mouth daily.   03/18/2014 at Unknown time  . lansoprazole (PREVACID) 15 MG capsule Take 15 mg by mouth daily at 12 noon.   03/18/2014 at Unknown time  . simvastatin (ZOCOR) 20 MG tablet Take 20 mg by mouth daily.    03/18/2014 at Unknown time  . Tamsulosin HCl (FLOMAX PO) Take 0.4 mg by mouth daily.   03/18/2014  . sildenafil (VIAGRA) 100 MG tablet Take 1 tablet (100 mg total) by mouth  daily as needed for erectile dysfunction. 15 tablet 3 unknown    VITALS: Blood pressure 134/78, pulse 60, temperature 97.8 F (36.6 C), temperature source Oral, resp. rate 14, height _0  (1.753 m), weight 253 lb 4.8 oz (114.896 kg), SpO2 99 %.  PHYSICAL EXAM: General appearance: alert and no distress Neck: no carotid bruit and no JVD Lungs: clear to auscultation bilaterally Heart: regular rate and rhythm, S1, S2 normal, no murmur, click, rub or gallop Abdomen: soft, non-tender; bowel sounds normal; no masses,  no organomegaly Extremities: extremities normal, atraumatic, no cyanosis or edema Pulses: 2+ and symmetric Skin: Skin color, texture, turgor normal. No rashes or lesions Neurologic: Grossly normal Psych: Pleasant  LABS: Results for orders placed or performed during the hospital encounter of 03/18/14 (from the past 48 hour(s))  CBC     Status: None   Collection Time: 03/18/14  2:33 PM  Result Value Ref Range   WBC 5.5 4.0 - 10.5 K/uL   RBC 4.92 4.22 - 5.81 MIL/uL   Hemoglobin 14.7 13.0 - 17.0 g/dL   HCT 41.9 39.0 - 52.0 %   MCV 85.2 78.0 - 100.0 fL   MCH 29.9 26.0 - 34.0 pg   MCHC 35.1 30.0 - 36.0 g/dL   RDW 12.6 11.5 - 15.5 %   Platelets 165 150 - 400 K/uL  Basic metabolic panel     Status: Abnormal   Collection Time: 03/18/14  2:33 PM  Result Value Ref Range   Sodium 137 135 - 145 mmol/L    Comment: Please note change in reference range.   Potassium 4.0 3.5 - 5.1 mmol/L    Comment: Please note change in reference range.   Chloride 102 96 - 112 mEq/L   CO2 31 19 - 32 mmol/L   Glucose, Bld 93 70 - 99 mg/dL   BUN 10 6 - 23 mg/dL   Creatinine, Ser 1.05 0.50 - 1.35 mg/dL   Calcium 8.8 8.4 - 10.5 mg/dL   GFR calc non Af Amer 70 (L) >90 mL/min   GFR calc Af Amer 81 (L) >90 mL/min    Comment: (NOTE) The eGFR has been calculated using the CKD EPI equation. This calculation has not been validated in all clinical situations. eGFR's persistently <90 mL/min signify  possible Chronic Kidney Disease.    Anion gap 4 (L) 5 - 15  BNP (order ONLY if patient complains of dyspnea/SOB AND you have documented it for THIS visit)     Status: Abnormal   Collection Time: 03/18/14  2:33 PM  Result Value Ref Range   B Natriuretic Peptide 101.5 (H) 0.0 - 100.0 pg/mL    Comment: Please note change in reference range.  D-dimer, quantitative     Status: Abnormal   Collection Time: 03/18/14  2:33 PM  Result  Value Ref Range   D-Dimer, Quant 1.54 (H) 0.00 - 0.48 ug/mL-FEU    Comment:        AT THE INHOUSE ESTABLISHED CUTOFF VALUE OF 0.48 ug/mL FEU, THIS ASSAY HAS BEEN DOCUMENTED IN THE LITERATURE TO HAVE A SENSITIVITY AND NEGATIVE PREDICTIVE VALUE OF AT LEAST 98 TO 99%.  THE TEST RESULT SHOULD BE CORRELATED WITH AN ASSESSMENT OF THE CLINICAL PROBABILITY OF DVT / VTE.   I-stat troponin, ED (not at [facility])     Status: None   Collection Time: 03/18/14  2:55 PM  Result Value Ref Range   Troponin i, poc 0.01 0.00 - 0.08 ng/mL   Comment 3            Comment: Due to the release kinetics of cTnI, a negative result within the first hours of the onset of symptoms does not rule out myocardial infarction with certainty. If myocardial infarction is still suspected, repeat the test at appropriate intervals.   Lipid panel     Status: Abnormal   Collection Time: 03/18/14  6:22 PM  Result Value Ref Range   Cholesterol 159 0 - 200 mg/dL   Triglycerides 140 <150 mg/dL   HDL 38 (L) >39 mg/dL   Total CHOL/HDL Ratio 4.2 RATIO   VLDL 28 0 - 40 mg/dL   LDL Cholesterol 93 0 - 99 mg/dL    Comment:        Total Cholesterol/HDL:CHD Risk Coronary Heart Disease Risk Table                     Men   Women  1/2 Average Risk   3.4   3.3  Average Risk       5.0   4.4  2 X Average Risk   9.6   7.1  3 X Average Risk  23.4   11.0        Use the calculated Patient Ratio above and the CHD Risk Table to determine the patient's CHD Risk.        ATP III CLASSIFICATION (LDL):  <100      mg/dL   Optimal  100-129  mg/dL   Near or Above                    Optimal  130-159  mg/dL   Borderline  160-189  mg/dL   High  >190     mg/dL   Very High   Troponin I-serum (0, 3, 6 hours)     Status: None   Collection Time: 03/18/14  6:22 PM  Result Value Ref Range   Troponin I <0.03 <0.031 ng/mL    Comment:        NO INDICATION OF MYOCARDIAL INJURY. Please note change in reference range.   Troponin I-serum (0, 3, 6 hours)     Status: None   Collection Time: 03/18/14  8:04 PM  Result Value Ref Range   Troponin I <0.03 <0.031 ng/mL    Comment:        NO INDICATION OF MYOCARDIAL INJURY. Please note change in reference range.   Troponin I-serum (0, 3, 6 hours)     Status: None   Collection Time: 03/18/14 11:13 PM  Result Value Ref Range   Troponin I <0.03 <0.031 ng/mL    Comment:        NO INDICATION OF MYOCARDIAL INJURY. Please note change in reference range.     IMAGING: Dg Chest 2 View  03/18/2014  CLINICAL DATA:  Shortness of breath and chest tightness.  EXAM: CHEST  2 VIEW  COMPARISON:  CT chest 12/22/2011.  PA and lateral chest 12/27/2012.  FINDINGS: The lungs are clear. Heart size is normal. No pneumothorax or pleural effusion. The patient is status post aortic valve replacement.  IMPRESSION: No acute disease.   Electronically Signed   By: Inge Rise M.D.   On: 03/18/2014 15:08    HOSPITAL DIAGNOSES: Principal Problem:   Chest pain Active Problems:   Aortic stenosis   S/P AVR (aortic valve replacement)   Dyspnea on exertion   Hyperlipidemia   GERD (gastroesophageal reflux disease)   Unstable angina   IMPRESSION: 1. Unstable angina  RECOMMENDATION: 1. Mr. Marik is describing chest pain or shortness of breath which is progressive and associated with hypertension. I suspect this may be unstable angina. Troponins were negative overnight. His EKG is normal. Coronary arteries were mildly diseased in 2011 prior to his valve surgery. I would recommend a  Lexiscan nuclear stress test to evaluate for possible ischemia. In addition is noted that his d-dimer is elevated at 1.54. He does not describe acute onset shortness of breath or chest discomfort, therefore I suspect PE is less likely. Nevertheless, if his stress test is negative I would recommend a CT pulmonary angiogram to evaluate for possible PE. Case discussed with Dr. Conley Canal.  Time Spent Directly with Patient: 30 minutes  Pixie Casino, MD, [facility] Attending Cardiologist [facility]  Randolf Sansoucie C 03/19/2014, 10:06 AM

## 2014-03-20 ENCOUNTER — Observation Stay (HOSPITAL_COMMUNITY): Payer: Medicare Other

## 2014-03-20 ENCOUNTER — Encounter (HOSPITAL_COMMUNITY): Payer: Self-pay | Admitting: Physician Assistant

## 2014-03-20 DIAGNOSIS — R0789 Other chest pain: Secondary | ICD-10-CM | POA: Diagnosis not present

## 2014-03-20 DIAGNOSIS — N3020 Other chronic cystitis without hematuria: Secondary | ICD-10-CM | POA: Diagnosis present

## 2014-03-20 DIAGNOSIS — R072 Precordial pain: Secondary | ICD-10-CM

## 2014-03-20 DIAGNOSIS — C61 Malignant neoplasm of prostate: Secondary | ICD-10-CM | POA: Diagnosis present

## 2014-03-20 DIAGNOSIS — Z8546 Personal history of malignant neoplasm of prostate: Secondary | ICD-10-CM | POA: Diagnosis not present

## 2014-03-20 DIAGNOSIS — R0600 Dyspnea, unspecified: Secondary | ICD-10-CM | POA: Diagnosis not present

## 2014-03-20 DIAGNOSIS — E669 Obesity, unspecified: Secondary | ICD-10-CM | POA: Diagnosis present

## 2014-03-20 MED ORDER — TECHNETIUM TC 99M DIETHYLENETRIAME-PENTAACETIC ACID: 40.0000 | Freq: Once | INTRAVENOUS | Status: DC | PRN

## 2014-03-20 MED ORDER — TECHNETIUM TO 99M ALBUMIN AGGREGATED: 6.0000 | Freq: Once | INTRAVENOUS | Status: DC | PRN

## 2014-03-20 MED ORDER — AMLODIPINE BESYLATE 5 MG PO TABS: 5.0000 mg | ORAL_TABLET | Freq: Every day | ORAL | Status: DC

## 2014-03-20 MED ORDER — AMLODIPINE BESYLATE 5 MG PO TABS
5.0000 mg | ORAL_TABLET | Freq: Every day | ORAL | Status: DC
  Administered 2014-03-20: 5 mg via ORAL
  Filled 2014-03-20: qty 1

## 2014-03-20 NOTE — Discharge Summary (Signed)
Physician Discharge Summary  Jeff Johnson FKC:[id number] DOB: [birth year] DOA: 03/18/2014  PCP: [name], MD  Admit date: 03/18/2014 Discharge date: 03/20/2014  Recommendations for Outpatient Follow-up:  1. F/u TCTS s/p AVR 2. Consider PFTs as outpatient  Discharge Diagnoses:  Principal Problem:   Chest pain Active Problems:   Aortic stenosis   S/P AVR (aortic valve replacement)   Dyspnea on exertion   Hyperlipidemia   GERD (gastroesophageal reflux disease)   Obesity   Prostate cancer   Chronic cystitis   Discharge Condition: stable  Filed Weights   03/18/14 1423 03/19/14 0500  Weight: 115.667 kg (255 lb) 114.896 kg (253 lb 4.8 oz)    History of present illness:  69 year old male with BPH, GERD, history of aortic valve replacement in 2011 presented to ED with intermittent chest pains and dyspnea on exertion. History was obtained from the patient and his wife, patient reported that he's been having dyspnea on exertion in the last 1 month, progressively worsening especially with the flight of stairs. He sleeps on 2 pillows, has gained about 10 pounds in last 1 month. Denies any peripheral edema. Had chest comfort today after eating breakfast around 8:30 in the morning described as chest tightness otherwise no nausea, diaphoresis or vomiting or any numbness or tingling in the arms. Patient's symptoms improved with one sublingual nitroglycerin and aspirin. EKG with no acute ST-T wave changes, troponin 1 negative.  Hospital Course:  Admitted to telemetry. MI ruled out.  Myoview low risk.  D dimer elevated.  H/o anaphylaxis with IV contrast.  VQ done and was normal.  Echo showed slightly larger aortic root, normal EF. Will follow up with TCTS.  Amlodipine added for blood pressure control.  May consider outpatient PFTs if DOE continues to be a problem.  May be related to obesity, deconditioning as well.  Procedures: None  Consultations:  cardiology  Discharge Exam: Filed  Vitals:   03/20/14 1346  BP: 126/75  Pulse: 57  Temp: 97.8 F (36.6 C)  Resp: 16    General: comfortable, talkative Cardiovascular: RRR Respiratory: CTA without WRR Ext no CCE  Discharge Instructions   Discharge Instructions    Activity as tolerated - No restrictions    Complete by:  As directed      Diet - low sodium heart healthy    Complete by:  As directed           Current Discharge Medication List    START taking these medications   Details  amLODipine (NORVASC) 5 MG tablet Take 1 tablet (5 mg total) by mouth daily. Qty: 30 tablet, Refills: 1      CONTINUE these medications which have NOT CHANGED   Details  acetaminophen (TYLENOL) 500 MG tablet Take 500 mg by mouth every 6 (six) hours as needed for mild pain.    docusate sodium (COLACE) 100 MG capsule Take 100 mg by mouth daily.    finasteride (PROSCAR) 5 MG tablet Take 5 mg by mouth daily.    lansoprazole (PREVACID) 15 MG capsule Take 15 mg by mouth daily at 12 noon.    simvastatin (ZOCOR) 20 MG tablet Take 20 mg by mouth daily.    Associated Diagnoses: Dissection of aorta, thoracic    Tamsulosin HCl (FLOMAX PO) Take 0.4 mg by mouth daily.    sildenafil (VIAGRA) 100 MG tablet Take 1 tablet (100 mg total) by mouth daily as needed for erectile dysfunction. Qty: 15 tablet, Refills: 3       Allergies  Allergen Reactions  . Iodinated Diagnostic Agents Anaphylaxis   Follow-up Information    Follow up with [facility], [place].   Specialty:  Family Medicine   Why:  As needed   Contact information:   [address] [place] [address] [phone]       Follow up with Nahser, Wonda Cheng, MD.   Specialty:  Cardiology   Contact information:   [address] [place] [address] [phone]       Follow up with VAN Wilber Oliphant, MD In 1 month.   Specialty:  Cardiothoracic Surgery   Contact information:   [address] [place] [address] [phone]         The results of significant diagnostics from this hospitalization (including imaging, microbiology, ancillary and laboratory) are listed below for reference.    Significant Diagnostic Studies: Dg Chest 2 View  03/18/2014   CLINICAL DATA:  Shortness of breath and chest tightness.  EXAM: CHEST  2 VIEW  COMPARISON:  CT chest 12/22/2011.  PA and lateral chest 12/27/2012.  FINDINGS: The lungs are clear. Heart size is normal. No pneumothorax or pleural effusion. The patient is status post aortic valve replacement.  IMPRESSION: No acute disease.   Electronically Signed   By: Inge Rise M.D.   On: 03/18/2014 15:08   Nm Myocar Multi W/spect W/wall Motion / Ef  03/19/2014   CLINICAL DATA:  Chest pain  EXAM: MYOCARDIAL IMAGING WITH SPECT (REST AND PHARMACOLOGIC-STRESS)  GATED LEFT VENTRICULAR WALL MOTION STUDY  LEFT VENTRICULAR EJECTION FRACTION  TECHNIQUE: Standard myocardial SPECT imaging was performed after resting intravenous injection of 10 mCi Tc-26m sestamibi. Subsequently, intravenous infusion of Lexiscan was performed under the supervision of the Cardiology staff. At peak effect of the drug, 30 mCi Tc-4m sestamibi was injected intravenously and standard myocardial SPECT imaging was performed. Quantitative gated imaging was also performed to evaluate left ventricular wall motion, and estimate left ventricular ejection fraction.  COMPARISON:  None.  FINDINGS: Perfusion: Allowing for attenuation artifact at the apex, there are no perfusion defects. This is confirmed on the gated slice images.  Wall Motion: There is akinesis of the septum but otherwise normal wall motion.  Left Ventricular Ejection Fraction: 57 %  End diastolic volume 542 ml  End systolic volume 49 ml  IMPRESSION: 1. No reversible ischemia or infarction.  2. There is septal akinesis but otherwise normal wall motion. This is nonspecific.  3. Left ventricular ejection fraction 57%  4. Low-risk stress test findings*.  *2012 Appropriate  Use Criteria for Coronary Revascularization Focused Update: J Am Coll Cardiol. 7062;37(6):283-151. http://content.airportbarriers.com.aspx?articleid=1201161   Electronically Signed   By: Maryclare Bean M.D.   On: 03/19/2014 16:58   Nm Pulmonary Perf And Vent  03/20/2014   CLINICAL DATA:  Dyspnea, elevated D-dimer, history prostate cancer  EXAM: NUCLEAR MEDICINE VENTILATION - PERFUSION LUNG SCAN  TECHNIQUE: Ventilation images were obtained in multiple projections using inhaled aerosol technetium 99 M DTPA. Perfusion images were obtained in multiple projections after intravenous injection of Tc-29m MAA.  RADIOPHARMACEUTICALS:  40 mCi Tc-4m DTPA aerosol inhalation and 6 mCi Tc-14m MAA IV  COMPARISON:  None  Radiographic correlation:  Chest radiographs 03/18/2014  FINDINGS: Ventilation: Central airway deposition of tracer. No focal ventilatory defects identified.  Perfusion: Normal  IMPRESSION: Normal exam.   Electronically Signed   By: Lavonia Dana M.D.   On: 03/20/2014 17:21   Echo Left ventricle: The cavity size was normal. Wall  thickness was increased in a pattern of moderate LVH. Systolic function was normal. The estimated ejection fraction was in the range of 60% to 65%. Wall motion was normal; there were no regional wall motion abnormalities. Doppler parameters are consistent with abnormal left ventricular relaxation (grade 1 diastolic dysfunction). The E/e&' ratio is between 8-15, suggesting indeterminate LV filling pressure. - Aortic valve: Poorly visualized - prosthetic valve. There was no regurgitation. Mean gradient (S): 7 mm Hg. Peak gradient (S): 16 mm Hg. - Aorta: S/p Bentall. Aortic root dimension: 43 mm (ED). - Mitral valve: Calcified annulus. There was no significant regurgitation.  Impressions:  - Compared to the prior echo in 2011, the aortic root measures slightly larger - it is s/p replacement. A repeat CT of the aorta may be helpful to r/o aneurysm  around the graft. The gradients across the prosthetic aortic valve are stable.  EKG NSR  Microbiology: No results found for this or any previous visit (from the past 240 hour(s)).   Labs: Basic Metabolic Panel:  Recent Labs Lab 03/18/14 1433  NA 137  K 4.0  CL 102  CO2 31  GLUCOSE 93  BUN 10  CREATININE 1.05  CALCIUM 8.8   Liver Function Tests: No results for input(s): AST, ALT, ALKPHOS, BILITOT, PROT, ALBUMIN in the last 168 hours. No results for input(s): LIPASE, AMYLASE in the last 168 hours. No results for input(s): AMMONIA in the last 168 hours. CBC:  Recent Labs Lab 03/18/14 1433  WBC 5.5  HGB 14.7  HCT 41.9  MCV 85.2  PLT 165   Cardiac Enzymes:  Recent Labs Lab 03/18/14 1822 03/18/14 2004 03/18/14 2313  TROPONINI <0.03 <0.03 <0.03   BNP: BNP (last 3 results) No results for input(s): PROBNP in the last 8760 hours. CBG: No results for input(s): GLUCAP in the last 168 hours.     SignedDelfina Redwood  [facility] 03/20/2014, 5:56 PM

## 2014-03-20 NOTE — Discharge Instructions (Signed)
Cardiac Diet  A cardiac diet can help stop heart disease or a stroke from happening. It involves eating less unhealthy fats and eating more healthy fats.   FOODS TO AVOID OR LIMIT  · Limit saturated fats. This type of fat is found in oils and dairy products, such as:  ¨ Coconut oil.  ¨ Palm oil.  ¨ Cocoa butter.  ¨ Butter.  · Avoid trans-fat or hydrogenated oils. These are found in fried or pre-made baked goods, such as:  ¨ Margarine.  ¨ Pre-made cookies, cakes, and crackers.  · Limit processed meats (hot dogs, deli meats, sausage) to 3 ounces a week.  · Limit high-fat meats (marbled meats, fried chicken, or chicken with skin) to 3 ounces a week.  · Limit salt (sodium) to 1500 milligrams a day.  ·  Limit sweets and drinks with added sugar to no more than 5 servings a week. One serving is:  ¨ 1 tablespoon of sugar.  ¨ 1 tablespoon of jelly or jam.  ¨ ½ cup sorbet.  ¨ 1 cup lemonade.  ¨ ½ cup regular soda.  EAT MORE OF THE FOLLOWING FOODS  Fruit  · Eat 4 to 5 servings a day. One serving of fruit is:  ¨ 1 medium whole fruit.  ¨ ¼ cup dried fruit.  ¨ ½ cup of fresh, frozen, or canned fruit.  ¨ ½ cup 100% fruit juice.  Vegetables  · Eat 4 to 5 servings a day. One serving is:  ¨ 1 cup raw leafy vegetables.  ¨ ½ cup raw or cooked, cut-up vegetables.  ¨ ½ cup vegetable juice.  Whole Grains  · Eat 3 servings a day (1 ounce equals 1 serving).  Legumes (such as beans, peas, and lentils)   · Eat at least 4 servings a week (½ cup equals 1 serving).  Nuts and Seeds   · Eat at least 4 servings a week (¼ cup equals 1 serving).  Dietary Fiber  · Eat 20 to 30 grams a day. Some foods high in dietary fiber include:  ¨ Dried beans.  ¨ Citrus fruits.  ¨ Apples, bananas.  ¨ Broccoli, Brussels sprouts, and eggplant.  ¨ Oats.  Omega-3 Fats  · Eat food with omega-3 fats. You can also take a dietary pill (supplement) that has 1 gram of DHA and EPA. Have 3.5 ounces of fatty fish a week, such as:  ¨ Salmon.  ¨ Mackerel.  ¨ Albacore  tuna.  ¨ Sardines.  ¨ Lake trout.  ¨ Herring.  PREPARING YOUR FOOD  · Broil, bake, steam, or roast foods. Do not fry food. Do not cook food in butter (fat).  · Use non-stick cooking sprays.  · Remove skin from poultry, such as chicken and turkey.  · Remove fat from meat.  · Take the fat off the top of stews, soups, and gravy.  · Use lemon or herbs to flavor food instead of using butter or margarine.  · Use nonfat yogurt, salsa, or low-fat dressings for salads.  Document Released: 08/31/2011 Document Reviewed: 08/31/2011  [employer]® Patient Information ©2015 [employer]. This information is not intended to replace advice given to you by your health care provider. Make sure you discuss any questions you have with your health care provider.

## 2014-03-20 NOTE — Progress Notes (Signed)
UR completed 

## 2014-03-20 NOTE — Progress Notes (Signed)
Patient: Jeff Johnson / Admit Date: 03/18/2014 / Date of Encounter: 03/20/2014, 8:43 AM   Subjective: Feels great. No CP or SOB. Only gets DOE with going up stairs. Does not exercise but works 6 days a week for 6 hours and walks throughout the day.   Objective: Telemetry: NSR Physical Exam: Blood pressure 123/76, pulse 57, temperature 97.7 F (36.5 C), temperature source Oral, resp. rate 16, height 5\' 9"  (1.753 m), weight 253 lb 4.8 oz (114.896 kg), SpO2 99 %. General: Well developed obese WM, in no acute distress.  Head: Normocephalic, atraumatic, sclera non-icteric, no xanthomas, nares are without discharge. Neck: JVP not elevated. Lungs: Clear bilaterally to auscultation without wheezes, rales, or rhonchi. Breathing is unlabored. Heart: RRR S1 S2 without murmurs, rubs, or gallops.  Abdomen: Soft, non-tender, non-distended with normoactive bowel sounds. No rebound/guarding. Extremities: No clubbing or cyanosis. No edema. Distal pedal pulses are 2+ and equal bilaterally. Neuro: Alert and oriented X 3. Moves all extremities spontaneously. Psych:  Responds to questions appropriately with a normal affect.   Intake/Output Summary (Last 24 hours) at 03/20/14 0843 Last data filed at 03/19/14 2045  Gross per 24 hour  Intake    720 ml  Output      0 ml  Net    720 ml    Inpatient Medications:  . aspirin EC  81 mg Oral Daily  . docusate sodium  100 mg Oral Daily  . enoxaparin (LOVENOX) injection  40 mg Subcutaneous Q24H  . finasteride  5 mg Oral Daily  . pantoprazole  40 mg Oral Daily  . simvastatin  20 mg Oral Q supper  . tamsulosin  0.4 mg Oral Daily   Infusions:    Labs:  Recent Labs  03/18/14 1433  NA 137  K 4.0  CL 102  CO2 31  GLUCOSE 93  BUN 10  CREATININE 1.05  CALCIUM 8.8   No results for input(s): AST, ALT, ALKPHOS, BILITOT, PROT, ALBUMIN in the last 72 hours.  Recent Labs  03/18/14 1433  WBC 5.5  HGB 14.7  HCT 41.9  MCV 85.2  PLT 165    Recent  Labs  03/18/14 1822 03/18/14 2004 03/18/14 2313  TROPONINI <0.03 <0.03 <0.03   Invalid input(s): POCBNP No results for input(s): HGBA1C in the last 72 hours.   Radiology/Studies:  Dg Chest 2 View  03/18/2014   CLINICAL DATA:  Shortness of breath and chest tightness.  EXAM: CHEST  2 VIEW  COMPARISON:  CT chest 12/22/2011.  PA and lateral chest 12/27/2012.  FINDINGS: The lungs are clear. Heart size is normal. No pneumothorax or pleural effusion. The patient is status post aortic valve replacement.  IMPRESSION: No acute disease.   Electronically Signed   By: Inge Rise M.D.   On: 03/18/2014 15:08   Nm Myocar Multi W/spect W/wall Motion / Ef  03/19/2014   CLINICAL DATA:  Chest pain  EXAM: MYOCARDIAL IMAGING WITH SPECT (REST AND PHARMACOLOGIC-STRESS)  GATED LEFT VENTRICULAR WALL MOTION STUDY  LEFT VENTRICULAR EJECTION FRACTION  TECHNIQUE: Standard myocardial SPECT imaging was performed after resting intravenous injection of 10 mCi Tc-70m sestamibi. Subsequently, intravenous infusion of Lexiscan was performed under the supervision of the Cardiology staff. At peak effect of the drug, 30 mCi Tc-58m sestamibi was injected intravenously and standard myocardial SPECT imaging was performed. Quantitative gated imaging was also performed to evaluate left ventricular wall motion, and estimate left ventricular ejection fraction.  COMPARISON:  None.  FINDINGS: Perfusion: Allowing for attenuation artifact  at the apex, there are no perfusion defects. This is confirmed on the gated slice images.  Wall Motion: There is akinesis of the septum but otherwise normal wall motion.  Left Ventricular Ejection Fraction: 57 %  End diastolic volume 834 ml  End systolic volume 49 ml  IMPRESSION: 1. No reversible ischemia or infarction.  2. There is septal akinesis but otherwise normal wall motion. This is nonspecific.  3. Left ventricular ejection fraction 57%  4. Low-risk stress test findings*.  *2012 Appropriate Use Criteria  for Coronary Revascularization Focused Update: J Am Coll Cardiol. 1962;22(9):798-921. http://content.airportbarriers.com.aspx?articleid=1201161   Electronically Signed   By: Maryclare Bean M.D.   On: 03/19/2014 16:58     Assessment and Plan  1. Chest pain/dyspnea on exertion 2. 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 Prostate CA/chronic cystitis with ongoing hematuria 4. Cath 2011 with minor luminal irregularities, no significant CAD 5. Periodically elevated BP (in doctor's office recently, during pharmacologic stress test) 6. Obesity Body mass index is 37.39 kg/(m^2).  He has ruled out for MI and nuclear stress test was low risk without reversible ischemia or infarction, EF 57%. (There is septal akinesis but otherwise normal wall motion. This is nonspecific.) Per notes he is pending VQ to rule out PE given elevated d-dimer, symptoms, and history of cancer. He is hemodynamically stable and not tachycardic, tachypnic or hypoxic.  Regarding intermittent hypertension might consider addition of low dose amlodipine. He tends to run normotensives but has had recent spikes for unclear reasons. He has a cuff at home and we discussed monitoring BP periodically.  2D Echo shows that aortic root measures slightly larger, suggesting that a repeat CT of the aorta may be helpful to r/o aneurysm around the graft. Gradients across the AV are stable. Will d/w Dr. Stanford Breed. He is followed q26mo in the office by Dr. Prescott Gum.   Signed, Melina Copa PA-C As above, patient seen and examined. He denies chest pain or dyspnea. Nuclear study shows no ischemia. Echocardiogram shows normal LV function with mildly dilated aortic root. Await VQ scan to exclude pulmonary embolus. If negative patient can be discharged from a cardiac standpoint. I will add Norvasc 5 mg by mouth daily for hypertension. Following discharge he will see Dr. Acie Fredrickson for follow-up of his blood pressure. He can also have a  CT or MRI at this thoracic aorta followed up as an outpatient. Kirk Ruths

## 2014-03-22 ENCOUNTER — Other Ambulatory Visit: Payer: Self-pay | Admitting: Nurse Practitioner

## 2014-03-22 DIAGNOSIS — Z8679 Personal history of other diseases of the circulatory system: Secondary | ICD-10-CM

## 2014-03-22 DIAGNOSIS — I712 Thoracic aortic aneurysm, without rupture, unspecified: Secondary | ICD-10-CM

## 2014-03-22 DIAGNOSIS — Z9889 Other specified postprocedural states: Principal | ICD-10-CM

## 2014-03-23 ENCOUNTER — Telehealth: Payer: Self-pay | Admitting: Physician Assistant

## 2014-03-23 NOTE — Telephone Encounter (Signed)
The following messages were sent behind the scenes via staff message 03/21/14. CT appears to have since been scheduled.  - - - -    Maygan Koeller N Azaryah Heathcock, PA-C  [facility]    Cc: Margit Hanks, RN   Hi patient care coordinators,  This is a patient in the hospital whose thoracic aortic aneurysm repair is followed by Dr. Prescott Gum. In the hospital his echo showed that aortic root measures slightly larger, suggesting that a repeat CT of the aorta may be helpful to rule out aneurysm around the graft. Dr. Jacalyn Lefevre note indicates "He can also have a CT or MRI at this thoracic aorta followed up as an outpatient."   See message below. Dr. Lucianne Lei Trigt's office is requesting our office schedule a CT CHEST W/O since he has a severe contrast allergy. Results to Dr. Prescott Gum since he follows the patient for this. Please schedule. Thanks.  Melina Copa PA-C         ----- Message -----   From: Margit Hanks, RN   Sent: 03/21/2014 10:43 AM    To: Charlie Pitter, PA-C   Mr. Bunton has called requesting a f/u with Dr. Darcey Nora. I reviewed the discharge and your notes indicating an op CT and/or MRI chest would be done. We will schedule him a f/u after you have scheduled him a CT CHEST W/O since he has a severe contrast allergy. Thanks for your help. Abby Potash.

## 2014-03-25 DIAGNOSIS — R0602 Shortness of breath: Secondary | ICD-10-CM | POA: Diagnosis not present

## 2014-03-25 DIAGNOSIS — Z953 Presence of xenogenic heart valve: Secondary | ICD-10-CM | POA: Diagnosis not present

## 2014-03-25 DIAGNOSIS — I10 Essential (primary) hypertension: Secondary | ICD-10-CM | POA: Diagnosis not present

## 2014-03-25 DIAGNOSIS — R030 Elevated blood-pressure reading, without diagnosis of hypertension: Secondary | ICD-10-CM | POA: Diagnosis not present

## 2014-03-27 ENCOUNTER — Ambulatory Visit (INDEPENDENT_AMBULATORY_CARE_PROVIDER_SITE_OTHER)
Admission: RE | Admit: 2014-03-27 | Discharge: 2014-03-27 | Disposition: A | Discharge status: Home / Self Care | Payer: Medicare Other | Source: Ambulatory Visit | Attending: Cardiovascular Disease | Admitting: Cardiovascular Disease

## 2014-03-27 ENCOUNTER — Encounter: Payer: Self-pay | Admitting: Physician Assistant

## 2014-03-27 ENCOUNTER — Ambulatory Visit (INDEPENDENT_AMBULATORY_CARE_PROVIDER_SITE_OTHER): Payer: Medicare Other | Admitting: Physician Assistant

## 2014-03-27 VITALS — BP 129/70 | HR 62 | Ht 69.0 in | Wt 259.0 lb

## 2014-03-27 DIAGNOSIS — R0789 Other chest pain: Secondary | ICD-10-CM

## 2014-03-27 DIAGNOSIS — I712 Thoracic aortic aneurysm, without rupture, unspecified: Secondary | ICD-10-CM

## 2014-03-27 DIAGNOSIS — I350 Nonrheumatic aortic (valve) stenosis: Secondary | ICD-10-CM | POA: Diagnosis not present

## 2014-03-27 DIAGNOSIS — Z952 Presence of prosthetic heart valve: Secondary | ICD-10-CM | POA: Diagnosis not present

## 2014-03-27 DIAGNOSIS — K5792 Diverticulitis of intestine, part unspecified, without perforation or abscess without bleeding: Secondary | ICD-10-CM | POA: Insufficient documentation

## 2014-03-27 DIAGNOSIS — I951 Orthostatic hypotension: Secondary | ICD-10-CM | POA: Diagnosis not present

## 2014-03-27 DIAGNOSIS — I200 Unstable angina: Secondary | ICD-10-CM | POA: Diagnosis not present

## 2014-03-27 DIAGNOSIS — Z9889 Other specified postprocedural states: Secondary | ICD-10-CM | POA: Insufficient documentation

## 2014-03-27 DIAGNOSIS — R0600 Dyspnea, unspecified: Secondary | ICD-10-CM | POA: Diagnosis not present

## 2014-03-27 DIAGNOSIS — IMO0001 Reserved for inherently not codable concepts without codable children: Secondary | ICD-10-CM

## 2014-03-27 DIAGNOSIS — R319 Hematuria, unspecified: Secondary | ICD-10-CM | POA: Insufficient documentation

## 2014-03-27 DIAGNOSIS — I10 Essential (primary) hypertension: Secondary | ICD-10-CM | POA: Insufficient documentation

## 2014-03-27 DIAGNOSIS — Z91041 Radiographic dye allergy status: Secondary | ICD-10-CM | POA: Insufficient documentation

## 2014-03-27 DIAGNOSIS — I77810 Thoracic aortic ectasia: Secondary | ICD-10-CM | POA: Diagnosis not present

## 2014-03-27 DIAGNOSIS — N400 Enlarged prostate without lower urinary tract symptoms: Secondary | ICD-10-CM | POA: Insufficient documentation

## 2014-03-27 DIAGNOSIS — R030 Elevated blood-pressure reading, without diagnosis of hypertension: Secondary | ICD-10-CM | POA: Diagnosis not present

## 2014-03-27 DIAGNOSIS — E871 Hypo-osmolality and hyponatremia: Secondary | ICD-10-CM | POA: Insufficient documentation

## 2014-03-27 MED ORDER — AMLODIPINE BESYLATE 5 MG PO TABS: 2.5000 mg | ORAL_TABLET | Freq: Every day | ORAL | Status: DC

## 2014-03-27 MED ORDER — AMLODIPINE BESYLATE 2.5 MG PO TABS: ORAL_TABLET | ORAL | Status: DC

## 2014-03-27 NOTE — Patient Instructions (Addendum)
Your physician has recommended you make the following change in your medication:    START TAKING  2.5 MG  AMLODIPINE DAILY    FOLLOW UP  WITH NAHSER IN 6 TO 8 WEEKS    FOLLOW UP DR VANTRIGHT AS PLANNED

## 2014-03-27 NOTE — Progress Notes (Signed)
[facility] [place], [place] [place], [place]  [address] Phone: [phone] Fax:  [phone]  Date:  03/27/2014   Patient ID:  Jeff Johnson, Jeff Johnson 05-31-1945, MRN [id number]   PCP:  [name], MD  Cardiologist:  Dr. Acie Fredrickson  Electrophysiologist:  Dr. Lovena Le remotely    History of Present Illness: Jeff Johnson is a 69 y.o. male with history of severe AS and thoracic aortic aneurysm s/p Bentall with tissue AVR 2011, prostate CA/chronic cystitis with ongoing hematuria (x5 years, followed by Dr. Risa Grill), cardiac cath 2011 with minor luminal irregularities, recently elevated BP, history of orthostatic hypotension, obesity, GERD who presents for post-hospital follow-up.  He was recently admitted to the hospital with symptoms of CP/SOB. He has a history of intermittent dyspnea with exertion/upon standing per prior notes, but the patient felt this had been somewhat worse lately. He also developed a sensation of chest pressure/head fullness at his PCP's office when his BP was noted to be 180/120. He subsequently presented to the hospital for further evaluation. He ruled out for MI and nuclear stress test was low risk without reversible ischemia or infarction, EF 57%. (There is septal akinesis but otherwise normal wall motion. This is nonspecific.) CXR no actue disease. VQ was negative for PE. Due to periodic BP spikes, amlodipine 5mg  daily was added. 2D Echo showed that aortic root measures slightly larger, suggesting that a repeat CT of the aorta may be helpful to r/o aneurysm around the graft. Gradients across the AV were stable. Otherwise showed moderate LVH, EF 60-65%, grade 1 DD, indeterminant filling pressure. BNP was 101 (normal 100). A nurse from Dr. Lucianne Lei Trigt's office contacted our office to request scheduling of a nonconstrasted CT chest to further evaluate his aneurysm repair (severe contrast allergy). This is scheduled for today. Dr. Lucianne Lei Trigt's office plans to schedule an appointment once  this has been completed.  He has since seen his PCP and reports that SBP was 154 at that visit, so losartan 50mg  daily was added. He says his PCP wants him to eventually come off amlodipine but he is not sure why. He still has the same "strange feeling"/transient dyspnea upon standing but this has not changed with addition of both medicines (compared to before BP meds). This resolves after standing still for a moment. No pre-syncope or syncope. He denies any further chest pain. He continues to have some DOE. 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 When he comes home from work he rests on the couch and then goes to bed. On his days off, he goes shopping with his wife and tries to walk a few laps around the store. He does snore. He denies prior eval for OSA.  Orthostatics in the office: Lying 133/73 - pulse 60 Siting 122/67 - pulse 60 Standing 0 min - 120/71 - pulse 65 (with associated head fullness sensation) Standing 3 min - 121/70 - pulse 63  (with associated head fullness sensation)  Recent Labs: 03/18/2014: B Natriuretic Peptide 101.5*; Creatinine 1.05; HDL Cholesterol by NMR 38*; Hemoglobin 14.7; LDL (calc) 93; Potassium 4.0  Wt Readings from Last 3 Encounters:  03/27/14 259 lb (117.482 kg)  03/19/14 253 lb 4.8 oz (114.896 kg)  01/18/13 255 lb (115.667 kg)     Past Medical History  Diagnosis Date  . Orthostatic hypotension   . Dizziness and giddiness     a. H/o dizziness with reportedly negative tilt table. Holter 2012:  NSR, sinus tachy, frequent PVCs, multifocal at times in a trigeminal fashion.  . Severe aortic stenosis     a. 11/2009: pericardial AVR/Bentall.  . Thoracic aortic aneurysm     a. 11/2009: s/p Magna Ease pericardial tissue valve size 67mm and replacement of fusiform ascending thoracic aneurysm with 30-mm Hemashield cath with hemiarch reconstruction.  . Prostate cancer     a. s/p radiation.  . Chronic cystitis     a. With ongoing  hematuria.  . Hematuria   . Obesity   . H/O cardiac catheterization     a. 2011: minor luminal irregularities. b. Nuc 03/2014: ow risk without reversible ischemia or infarction, EF 57%. (There is septal akinesis but otherwise normal wall motion. This is nonspecific.)  . Elevated BP     a. 03/2014: started on amlodipine 2/2 periodical markedly elevated BPs.  . Hyponatremia     a. Remote hx hyponatremia felt 2/2 SSRI.  Marland Kitchen Diverticulitis     a. 2010: diverticulitis with stricture Sigmoid colectomy with mobilization of the splenic flexure within the end anastomosis.   Marland Kitchen BPH (benign prostatic hyperplasia)   . GERD (gastroesophageal reflux disease)   . Contrast media allergy     Current Outpatient Prescriptions  Medication Sig Dispense Refill  . acetaminophen (TYLENOL) 500 MG tablet Take 500 mg by mouth every 6 (six) hours as needed for mild pain.    Marland Kitchen amLODipine (NORVASC) 5 MG tablet Take 1 tablet (5 mg total) by mouth daily. 30 tablet 1  . aspirin 81 MG EC tablet Take 81 mg by mouth daily.    . finasteride (PROSCAR) 5 MG tablet Take 5 mg by mouth daily.    . lansoprazole (PREVACID) 15 MG capsule Take 15 mg by mouth daily at 12 noon.    Marland Kitchen losartan (COZAAR) 50 MG tablet Take 50 mg by mouth daily.   0  . simvastatin (ZOCOR) 20 MG tablet Take 20 mg by mouth daily.     . Tamsulosin HCl (FLOMAX PO) Take 0.4 mg by mouth daily.     No current facility-administered medications for this visit.    Allergies:   Iodinated diagnostic agents and Beta adrenergic blockers   Social History:  The patient  reports that he quit smoking about 47 years ago. His smoking use included Cigarettes. He does not have any smokeless tobacco history on file. He reports that he does not drink alcohol.   Family History:  The patient's family history includes Heart attack in his father; Heart disease (age of onset: 32) in his father; Stroke (age of onset: 2) in his mother.   ROS:  Please see the history of present  illness.   All other systems reviewed and negative.   PHYSICAL EXAM:  VS:  BP 129/70 mmHg  Pulse 62  Ht 5\' 9"  (1.753 m)  Wt 259 lb (117.482 kg)  BMI 38.23 kg/m2  SpO2 97% Well nourished obese WM, in no acute distress HEENT: normal Neck: no JVD Cardiac:  normal S1, S2; RRR; very soft SEM RUSB Lungs:  clear to auscultation bilaterally, no wheezing, rhonchi or rales Abd: soft, nontender, no hepatomegaly Ext: no edema Skin: warm and dry Neuro:  moves all extremities spontaneously, no focal abnormalities noted  ASSESSMENT AND PLAN:  1. Dyspnea on exertion - may be due to deconditioning in the setting of elevated SBPs recently. Will continue BP control and await CT results. Recent nuc/VQ/BNP ok. Not hypoxic, tachycardic or tachypnic. Encouraged continued regular activity. He will notify us  for worsening sx. 2. Chest discomfort - resolved. Suspect r/t episode of high BP. Recent nuc/VQ/troponins unremarkable. No significant CAD by cath in 2011. He remains on aspirin. 3. Recently elevated BP with history of orthostatic hypotension - amlodipine 5mg  was added in the hospital, and his PCP further added losartan 50mg  daily due to persistently elevated BP. The patient says his PCP wants him to eventually come off amlodipine but is unsure of the reason. Although orthostatic BPs were not classically diagnostic by criteria in the office today, he did note some facial fullness upon standing with a drop of about 19mmHg. Will decrease amlodipine to 2.5mg  daily. Home BP's have been running 130's on this dual-med regimen. He has f/u in 2 weeks with his PCP. At that time might consider titrating amlodipine off and increasing losartan if needed, to simplify med regimen. I offered him a sleep study to evaluate for OSA as a cause for his HTN but he declined at this time. He will continue to monitor his BP closely at home and call for any high/low readings or increase in orthostasis symptoms. 4. Severe AS and thoracic  aortic aneurysm s/p pericardial AVR and Bentall procedure 2011 - undergoing follow-up CT today. He says he was told by Dr. Lucianne Lei Trigt's office that they would contact him with timing of follow-up based on results. 5. Prostate CA with chronic cystitis/chronic hematuria - f/u Dr. Risa Grill as planned. Recent Hgb normal.  Dispo: F/u Dr. Acie Fredrickson in 2 months.  Signed, Melina Copa, PA-C  03/27/2014 8:55 AM

## 2014-03-27 NOTE — Addendum Note (Signed)
Addended by: Charlie Pitter on: 03/27/2014 11:37 AM   Modules accepted: Orders, Medications

## 2014-04-05 ENCOUNTER — Ambulatory Visit: Payer: Medicare Other | Admitting: Internal Medicine

## 2014-04-11 DIAGNOSIS — C61 Malignant neoplasm of prostate: Secondary | ICD-10-CM | POA: Diagnosis not present

## 2014-04-11 DIAGNOSIS — R0600 Dyspnea, unspecified: Secondary | ICD-10-CM | POA: Diagnosis not present

## 2014-04-11 DIAGNOSIS — E782 Mixed hyperlipidemia: Secondary | ICD-10-CM | POA: Diagnosis not present

## 2014-04-11 DIAGNOSIS — I10 Essential (primary) hypertension: Secondary | ICD-10-CM | POA: Diagnosis not present

## 2014-04-22 DIAGNOSIS — I10 Essential (primary) hypertension: Secondary | ICD-10-CM | POA: Diagnosis not present

## 2014-04-22 DIAGNOSIS — M7072 Other bursitis of hip, left hip: Secondary | ICD-10-CM | POA: Diagnosis not present

## 2014-04-22 DIAGNOSIS — M25552 Pain in left hip: Secondary | ICD-10-CM | POA: Diagnosis not present

## 2014-04-24 ENCOUNTER — Ambulatory Visit (INDEPENDENT_AMBULATORY_CARE_PROVIDER_SITE_OTHER): Payer: Medicare Other | Admitting: Cardiothoracic Surgery

## 2014-04-24 ENCOUNTER — Encounter: Payer: Self-pay | Admitting: Cardiothoracic Surgery

## 2014-04-24 VITALS — BP 125/71 | HR 58 | Resp 16 | Ht 69.0 in | Wt 250.0 lb

## 2014-04-24 DIAGNOSIS — I200 Unstable angina: Secondary | ICD-10-CM | POA: Diagnosis not present

## 2014-04-24 DIAGNOSIS — Z48812 Encounter for surgical aftercare following surgery on the circulatory system: Secondary | ICD-10-CM | POA: Diagnosis not present

## 2014-04-24 DIAGNOSIS — I712 Thoracic aortic aneurysm, without rupture, unspecified: Secondary | ICD-10-CM

## 2014-04-24 DIAGNOSIS — I350 Nonrheumatic aortic (valve) stenosis: Secondary | ICD-10-CM | POA: Diagnosis not present

## 2014-04-24 NOTE — Progress Notes (Signed)
PCP is [name], MD Referring Provider is [name], MD  Chief Complaint  Patient presents with  . Routine Post Op    s/p BENTALL 11/2009..CT CHEST 03/27/14    [id number] returns for routine followup and review of a recent CT scan of the chest for surveillance of his aorta after biologic- Bentall procedure 3 years ago. The patient was admitted to the hospital with severe hypertension and adjustment of medications. CT scan without contrast demonstrates normal reconstruction of his ascending aorta. The diameter at the sinus of Valsalva it was slightly enlarged at 4.6 cm. The echocardiogram showed normal LV function, normal gradient across the aortic valve without AIpatient is currently doing well at home and back working his regular schedule. He denies chest pain. He does have some orthostatic dizziness which is been chronic. He also has active bladder-prostate issues.  I personally reviewed the CT scan of his chest and the echocardiogram which were performed during his hospitalization last month. The graft and arch appeared normal. The sinus of Valsalva just above the aortic valve is slightly increased but without significant change and does not represent a risk for dissection and a diameter of 4.8 cm  Past Medical History  Diagnosis Date  . Orthostatic hypotension   . Dizziness and giddiness     a. H/o dizziness with reportedly negative tilt table. Holter 2012: NSR, sinus tachy, frequent PVCs, multifocal at times in a trigeminal fashion.  . Severe aortic stenosis     a. 11/2009: pericardial AVR/Bentall.  . Thoracic aortic aneurysm     a. 11/2009: s/p Magna Ease pericardial tissue valve size 52mm and replacement of fusiform ascending thoracic aneurysm with 30-mm Hemashield cath with hemiarch reconstruction.  . Prostate cancer     a. s/p radiation.  . Chronic cystitis     a. With ongoing hematuria.  . Hematuria   . Obesity   . H/O cardiac catheterization     a. 2011: minor  luminal irregularities. b. Nuc 03/2014: ow risk without reversible ischemia or infarction, EF 57%. (There is septal akinesis but otherwise normal wall motion. This is nonspecific.)  . Elevated BP     a. 03/2014: started on amlodipine 2/2 periodical markedly elevated BPs.  . Hyponatremia     a. Remote hx hyponatremia felt 2/2 SSRI.  Marland Kitchen Diverticulitis     a. 2010: diverticulitis with stricture Sigmoid colectomy with mobilization of the splenic flexure within the end anastomosis.   Marland Kitchen BPH (benign prostatic hyperplasia)   . GERD (gastroesophageal reflux disease)   . Contrast media allergy     Past Surgical History  Procedure Laterality Date  . Tissue aortic valve replacement  12/11/09  . 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 Cholecystectomy  2010  . Colon surgery  2010    colon resection  . Doppler carotid  2012  . Doppler echocardiography  2011    Family History  Problem Relation Age of Onset  . Heart disease Father 72  . Stroke Mother 28  . Heart attack Father     Social History History  Substance Use Topics  . Smoking status: Former Smoker    Types: Cigarettes    Quit date: 03/16/1967  . Smokeless tobacco: Not on file  . Alcohol Use: No    Current Outpatient Prescriptions  Medication Sig Dispense Refill  . acetaminophen (TYLENOL) 500 MG tablet Take 500 mg  by mouth every 6 (six) hours as needed for mild pain.    Marland Kitchen amLODipine (NORVASC) 5 MG tablet Take 0.5 tablets (2.5 mg total) by mouth daily.    Marland Kitchen aspirin 81 MG EC tablet Take 81 mg by mouth daily.    . finasteride (PROSCAR) 5 MG tablet Take 5 mg by mouth daily.    . lansoprazole (PREVACID) 15 MG capsule Take 15 mg by mouth daily at 12 noon.    Marland Kitchen losartan (COZAAR) 100 MG tablet Take 100 mg by mouth daily.    . simvastatin (ZOCOR) 20 MG tablet Take 20 mg by mouth daily.     . Tamsulosin HCl (FLOMAX PO) Take 0.4 mg  by mouth daily.     No current facility-administered medications for this visit.    Allergies  Allergen Reactions  . Iodinated Diagnostic Agents Anaphylaxis  . Beta Adrenergic Blockers     Remote reportedh/o intolerance due to weakness per notes     Review of Systems    General:   Normal appetite, no weight loss, no fever or night sweats Cardiac:      - Chest pain with exertion,  - chest pain at rest,  - SOB with exertion,                    -PND,  - orthopnea,  - palpitations or arrhythmias,   -=  history atrial fibrillation                 + dizzy spells-presyncope,   Syncope,  LE edema Respiratory:  No Shortness of breath,  no home oxygen, no productive cough, no                  sleep apnea, no CPAP at night, no hemoptysis, no COPD GI:           No difficulty swallowing, no reflux, no hiatal hernia-heartburn, no chronic                          abdominal pain, no hematochezia, no hematemesis, no melena GU:       +dysuria, + frequency, no UTI recently+ hematuria, no kidney stones,               + BPH Vascular: No pain suggestive of claudication, no varicose veins, no DVT, no nonhealing                Foot ulcer, no rest pain suggestive of ischemia Neuro:   No stroke, no TIAs, no seizures, no neuropathy, no gait instability, no                             memory/cognitive dysfunction                Musculoskeletal:  No arthritis, no joint swelling, no difficulty walking, no decreased                        Mobility Skin:     No rash, no ulcerations or pressure sores Psych:    No anxiety, no depression, Eyes:    No change in vision, no amaurosis, no eye surgery ENT:    No hearing loss, no loose or painful teeth, no dentures, no recent dental  procedure Hematologic:  No easy bruising, no bleeding disorder, no frequent epistaxes Endocrine:  No diabetes,  - checks CBG at home    BP 125/71 mmHg  Pulse 58  Resp 16  Ht 5\' 9"  (1.753 m)  Wt 250 lb (113.399  kg)  BMI 36.90 kg/m2  SpO2 98% Physical Exam General appearance-well-developed Caucasian male no acute distress HEENT normocephalic pupils equal dentition clean Neck-supple without JVD or bruit Cardiovascular-regular rhythm, no murmur of AI, palpable pulses in all extremities Abdomen-soft nontender obese Extremities-no clubbing cyanosis edema or tenderness Skin-without rash or lesion Neuro-alert appropriate no focal motor deficit, normal gait  Diagnostic Tests: CT scan of the chest and echocardiogram personally reviewed and discussed with patient. His symptoms of weakness or do to his severe hypertension which have now improved by increasing his losartan  Impression: Intact biologic-Bentall repair of his severe AI and ascending aneurysm  Plan:return with CT scan of the thoracic aorta one year for surveillance

## 2014-05-13 DIAGNOSIS — E782 Mixed hyperlipidemia: Secondary | ICD-10-CM | POA: Diagnosis not present

## 2014-05-13 DIAGNOSIS — I10 Essential (primary) hypertension: Secondary | ICD-10-CM | POA: Diagnosis not present

## 2014-05-13 DIAGNOSIS — R739 Hyperglycemia, unspecified: Secondary | ICD-10-CM | POA: Diagnosis not present

## 2014-05-21 ENCOUNTER — Ambulatory Visit (INDEPENDENT_AMBULATORY_CARE_PROVIDER_SITE_OTHER): Payer: Medicare Other | Admitting: Cardiovascular Disease

## 2014-05-21 ENCOUNTER — Encounter: Payer: Self-pay | Admitting: Cardiovascular Disease

## 2014-05-21 VITALS — BP 148/78 | HR 55 | Ht 69.0 in | Wt 254.8 lb

## 2014-05-21 DIAGNOSIS — E785 Hyperlipidemia, unspecified: Secondary | ICD-10-CM | POA: Diagnosis not present

## 2014-05-21 DIAGNOSIS — I10 Essential (primary) hypertension: Secondary | ICD-10-CM | POA: Diagnosis not present

## 2014-05-21 DIAGNOSIS — I712 Thoracic aortic aneurysm, without rupture, unspecified: Secondary | ICD-10-CM

## 2014-05-21 DIAGNOSIS — I350 Nonrheumatic aortic (valve) stenosis: Secondary | ICD-10-CM

## 2014-05-21 DIAGNOSIS — I200 Unstable angina: Secondary | ICD-10-CM | POA: Diagnosis not present

## 2014-05-21 HISTORY — DX: Essential (primary) hypertension: I10

## 2014-05-21 NOTE — Patient Instructions (Signed)
Your physician recommends that you continue on your current medications as directed. Please refer to the Current Medication list given to you today.  Your physician wants you to follow-up in: 1 year with Dr. Nahser.  You will receive a reminder letter in the mail two months in advance. If you don't receive a letter, please call our office to schedule the follow-up appointment.  

## 2014-05-21 NOTE — Progress Notes (Signed)
Cardiology Office Note   Date:  05/21/2014   ID:  Jeff Johnson 09-27-1945, MRN [id number]  PCP:  [name], MD  Cardiologist:   Thayer Headings, MD   Chief Complaint  Patient presents with  . Follow-up    Bentall procedure   1. Aortic stenosis/ aortic insufficiency-status post Bentall procedure- 2011 2. History of chronic cystitis 3. Hyperlipidemia 4. Dizziness - ? orthostasis. Negative tilt table study. 30 day monitor negative. Occurs with standing or sitting, did not improve wit Midodrine  History of Present Illness:  69 yo with hx of aortic valve disease - s/p Bentall procedure.   He's been having some problems with dizziness. He has seen Dr. Crissie Sickles. He's had a 30 day event monitor which was negative. He had a tilt table study which was negative. He has had episodes of dizziness while standing and with sitting. He usually has several seconds of warning. If he is driving his car he was able to pull over to the side of the road. He has tried Midodrine but does not tolerate it very well. It causes him to be anxious and also causes urinary retention. He also has been eating lots of salt and that helped some.  He complains of generalized fatigue. He does not sleep very well. He has had prostate cancer and has to go to the bathroom at least 3 times a night.  Nov. 6, 2014:   Jeff Johnson is doing well. He is working part time which involves standing and walking for 8 hours. ( [employer]) No CP, no dyspnea.   May 21, 2014:  Jeff Johnson is a 69 y.o. male who presents for follow up of his Bentall procedure in 2011.   Has recorded his BP.   Readings are mostly ok.  No CP , no dyspnea  Past Medical History  Diagnosis Date  . Orthostatic hypotension   . Dizziness and giddiness     a. H/o dizziness with reportedly negative tilt table. Holter 2012: NSR, sinus tachy, frequent PVCs, multifocal at times in a trigeminal fashion.  . Severe aortic  stenosis     a. 11/2009: pericardial AVR/Bentall.  . Thoracic aortic aneurysm     a. 11/2009: s/p Magna Ease pericardial tissue valve size 18mm and replacement of fusiform ascending thoracic aneurysm with 30-mm Hemashield cath with hemiarch reconstruction.  . Prostate cancer     a. s/p radiation.  . Chronic cystitis     a. With ongoing hematuria.  . Hematuria   . Obesity   . H/O cardiac catheterization     a. 2011: minor luminal irregularities. b. Nuc 03/2014: ow risk without reversible ischemia or infarction, EF 57%. (There is septal akinesis but otherwise normal wall motion. This is nonspecific.)  . Elevated BP     a. 03/2014: started on amlodipine 2/2 periodical markedly elevated BPs.  . Hyponatremia     a. Remote hx hyponatremia felt 2/2 SSRI.  Marland Kitchen Diverticulitis     a. 2010: diverticulitis with stricture Sigmoid colectomy with mobilization of the splenic flexure within the end anastomosis.   Marland Kitchen BPH (benign prostatic hyperplasia)   . GERD (gastroesophageal reflux disease)   . Contrast media allergy     Past Surgical History  Procedure Laterality Date  . Tissue aortic valve replacement  12/11/09  . 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 Cholecystectomy  2010  . Colon surgery  2010    colon resection  . Doppler carotid  2012  . Doppler echocardiography  2011     Current Outpatient Prescriptions  Medication Sig Dispense Refill  . acetaminophen (TYLENOL) 500 MG tablet Take 500 mg by mouth every 6 (six) hours as needed for mild pain.    Marland Kitchen amLODipine (NORVASC) 5 MG tablet Take 5 mg by mouth daily.  1  . aspirin 81 MG EC tablet Take 81 mg by mouth daily.    . finasteride (PROSCAR) 5 MG tablet Take 5 mg by mouth daily.    . lansoprazole (PREVACID) 15 MG capsule Take 15 mg by mouth daily at 12 noon.    Marland Kitchen losartan (COZAAR) 100 MG tablet Take 100 mg by mouth daily.    .  simvastatin (ZOCOR) 20 MG tablet Take 20 mg by mouth daily.     . Tamsulosin HCl (FLOMAX PO) Take 0.4 mg by mouth daily.     No current facility-administered medications for this visit.    Allergies:   Beta adrenergic blockers; Iodinated diagnostic agents; and Shellfish allergy    Social History:  The patient  reports that he quit smoking about 47 years ago. His smoking use included Cigarettes. He does not have any smokeless tobacco history on file. He reports that he does not drink alcohol.   Family History:  The patient's family history includes Heart attack in his father; Heart disease (age of onset: 81) in his father; Stroke (age of onset: 75) in his mother.    ROS:  Please see the history of present illness.    Review of Systems: Constitutional:  denies fever, chills, diaphoresis, appetite change and fatigue.  HEENT: denies photophobia, eye pain, redness, hearing loss, ear pain, congestion, sore throat, rhinorrhea, sneezing, neck pain, neck stiffness and tinnitus.  Respiratory: denies SOB, DOE, cough, chest tightness, and wheezing.  Cardiovascular: denies chest pain, palpitations and leg swelling.  Gastrointestinal: denies nausea, vomiting, abdominal pain, diarrhea, constipation, blood in stool.  Genitourinary: denies dysuria, urgency, frequency, hematuria, flank pain and difficulty urinating.  Musculoskeletal: denies  myalgias, back pain, joint swelling, arthralgias and gait problem.   Skin: denies pallor, rash and wound.  Neurological: denies dizziness, seizures, syncope, weakness, light-headedness, numbness and headaches.   Hematological: denies adenopathy, easy bruising, personal or family bleeding history.  Psychiatric/ Behavioral: denies suicidal ideation, mood changes, confusion, nervousness, sleep disturbance and agitation.       All other systems are reviewed and negative.    PHYSICAL EXAM: VS:  BP 148/78 mmHg  Pulse 55  Ht 5\' 9"  (1.753 m)  Wt 254 lb 12.8 oz  (115.577 kg)  BMI 37.61 kg/m2 , BMI Body mass index is 37.61 kg/(m^2). GEN: Well nourished, well developed, in no acute distress HEENT: normal Neck: no JVD, carotid bruits, or masses Cardiac: RRR; no murmurs, rubs, or gallops,no edema  Respiratory:  clear to auscultation bilaterally, normal work of breathing GI: soft, nontender, nondistended, + BS MS: no deformity or atrophy Skin: warm and dry, no rash Neuro:  Strength and sensation are intact Psych: normal   EKG:  EKG is not ordered today.    Recent Labs: 03/18/2014: B Natriuretic Peptide 101.5*; BUN 10; Creatinine 1.05; Hemoglobin 14.7; Platelets 165; Potassium 4.0; Sodium 137    Lipid Panel    Component Value Date/Time   CHOL 159 03/18/2014 1822   TRIG 140 03/18/2014 1822   HDL 38* 03/18/2014 1822   CHOLHDL 4.2 03/18/2014 1822  VLDL 28 03/18/2014 1822   LDLCALC 93 03/18/2014 1822      Wt Readings from Last 3 Encounters:  05/21/14 254 lb 12.8 oz (115.577 kg)  04/24/14 250 lb (113.399 kg)  03/27/14 259 lb (117.482 kg)      Other studies Reviewed: Additional studies/ records that were reviewed today include: . Review of the above records demonstrates:    ASSESSMENT AND PLAN:  1. Aortic stenosis/ aortic insufficiency-status post Bentall procedure- 2011, followed by Dr. Prescott Gum.  2. History of chronic cystitis 3. Hyperlipidemia -  Lipids are ok.  Encouraged a better diet and weight loss plan   4. Dizziness - ? orthostasis. Negative tilt table study. 30 day monitor negative. Occurs with standing or sitting, did not improve wit Midodrine 5.   Essential Hypertension:  BP is ok.  Encouraged him    Current medicines are reviewed at length with the patient today.  The patient does not have concerns regarding medicines.  The following changes have been made:  no change   Disposition:   FU with me in 1 year.     Signed, Tasean Mancha, Wonda Cheng, MD  05/21/2014 9:06 AM    [address], [place], [place]  [address] Phone: [phone]; Fax: [phone]

## 2014-06-21 DIAGNOSIS — H6121 Impacted cerumen, right ear: Secondary | ICD-10-CM | POA: Diagnosis not present

## 2014-06-21 DIAGNOSIS — I10 Essential (primary) hypertension: Secondary | ICD-10-CM | POA: Diagnosis not present

## 2014-06-21 DIAGNOSIS — E782 Mixed hyperlipidemia: Secondary | ICD-10-CM | POA: Diagnosis not present

## 2014-07-30 DIAGNOSIS — C61 Malignant neoplasm of prostate: Secondary | ICD-10-CM | POA: Diagnosis not present

## 2014-08-26 DIAGNOSIS — Z8546 Personal history of malignant neoplasm of prostate: Secondary | ICD-10-CM | POA: Diagnosis not present

## 2014-08-28 DIAGNOSIS — L821 Other seborrheic keratosis: Secondary | ICD-10-CM | POA: Diagnosis not present

## 2014-08-28 DIAGNOSIS — L570 Actinic keratosis: Secondary | ICD-10-CM | POA: Diagnosis not present

## 2014-08-28 DIAGNOSIS — L814 Other melanin hyperpigmentation: Secondary | ICD-10-CM | POA: Diagnosis not present

## 2014-09-09 ENCOUNTER — Other Ambulatory Visit: Payer: Self-pay

## 2014-09-26 DIAGNOSIS — M25552 Pain in left hip: Secondary | ICD-10-CM | POA: Diagnosis not present

## 2014-09-26 DIAGNOSIS — I10 Essential (primary) hypertension: Secondary | ICD-10-CM | POA: Diagnosis not present

## 2014-09-30 ENCOUNTER — Other Ambulatory Visit: Payer: Self-pay | Admitting: Family Medicine

## 2014-09-30 ENCOUNTER — Ambulatory Visit
Admission: RE | Admit: 2014-09-30 | Discharge: 2014-09-30 | Disposition: A | Discharge status: Home / Self Care | Payer: Medicare Other | Source: Ambulatory Visit | Attending: Family Medicine | Admitting: Family Medicine

## 2014-09-30 DIAGNOSIS — M25552 Pain in left hip: Secondary | ICD-10-CM

## 2014-09-30 DIAGNOSIS — M1612 Unilateral primary osteoarthritis, left hip: Secondary | ICD-10-CM | POA: Diagnosis not present

## 2014-10-15 DIAGNOSIS — I10 Essential (primary) hypertension: Secondary | ICD-10-CM | POA: Diagnosis not present

## 2014-10-15 DIAGNOSIS — M13811 Other specified arthritis, right shoulder: Secondary | ICD-10-CM | POA: Diagnosis not present

## 2014-10-15 DIAGNOSIS — M7072 Other bursitis of hip, left hip: Secondary | ICD-10-CM | POA: Diagnosis not present

## 2014-11-06 DIAGNOSIS — H6121 Impacted cerumen, right ear: Secondary | ICD-10-CM | POA: Diagnosis not present

## 2014-11-06 DIAGNOSIS — R05 Cough: Secondary | ICD-10-CM | POA: Diagnosis not present

## 2014-11-06 DIAGNOSIS — J329 Chronic sinusitis, unspecified: Secondary | ICD-10-CM | POA: Diagnosis not present

## 2014-11-06 DIAGNOSIS — Z954 Presence of other heart-valve replacement: Secondary | ICD-10-CM | POA: Diagnosis not present

## 2014-11-06 DIAGNOSIS — R0602 Shortness of breath: Secondary | ICD-10-CM | POA: Diagnosis not present

## 2014-11-19 DIAGNOSIS — R001 Bradycardia, unspecified: Secondary | ICD-10-CM | POA: Diagnosis not present

## 2014-11-19 DIAGNOSIS — Z23 Encounter for immunization: Secondary | ICD-10-CM | POA: Diagnosis not present

## 2014-11-19 DIAGNOSIS — I10 Essential (primary) hypertension: Secondary | ICD-10-CM | POA: Diagnosis not present

## 2014-11-19 DIAGNOSIS — F330 Major depressive disorder, recurrent, mild: Secondary | ICD-10-CM | POA: Diagnosis not present

## 2014-12-02 DIAGNOSIS — M1612 Unilateral primary osteoarthritis, left hip: Secondary | ICD-10-CM | POA: Diagnosis not present

## 2014-12-12 ENCOUNTER — Encounter (HOSPITAL_COMMUNITY): Payer: Self-pay

## 2014-12-12 ENCOUNTER — Emergency Department (HOSPITAL_COMMUNITY)
Admission: EM | Admit: 2014-12-12 | Discharge: 2014-12-12 | Disposition: A | Discharge status: Home / Self Care | Payer: Medicare Other | Attending: Emergency Medicine | Admitting: Emergency Medicine

## 2014-12-12 DIAGNOSIS — Z87891 Personal history of nicotine dependence: Secondary | ICD-10-CM | POA: Insufficient documentation

## 2014-12-12 DIAGNOSIS — Z7982 Long term (current) use of aspirin: Secondary | ICD-10-CM | POA: Insufficient documentation

## 2014-12-12 DIAGNOSIS — R109 Unspecified abdominal pain: Secondary | ICD-10-CM | POA: Diagnosis not present

## 2014-12-12 DIAGNOSIS — I10 Essential (primary) hypertension: Secondary | ICD-10-CM | POA: Insufficient documentation

## 2014-12-12 DIAGNOSIS — K219 Gastro-esophageal reflux disease without esophagitis: Secondary | ICD-10-CM | POA: Diagnosis not present

## 2014-12-12 DIAGNOSIS — R1110 Vomiting, unspecified: Secondary | ICD-10-CM | POA: Diagnosis not present

## 2014-12-12 DIAGNOSIS — Z8546 Personal history of malignant neoplasm of prostate: Secondary | ICD-10-CM | POA: Insufficient documentation

## 2014-12-12 DIAGNOSIS — E669 Obesity, unspecified: Secondary | ICD-10-CM | POA: Insufficient documentation

## 2014-12-12 DIAGNOSIS — Z8679 Personal history of other diseases of the circulatory system: Secondary | ICD-10-CM | POA: Diagnosis not present

## 2014-12-12 DIAGNOSIS — R339 Retention of urine, unspecified: Secondary | ICD-10-CM

## 2014-12-12 DIAGNOSIS — N400 Enlarged prostate without lower urinary tract symptoms: Secondary | ICD-10-CM | POA: Diagnosis not present

## 2014-12-12 DIAGNOSIS — R300 Dysuria: Secondary | ICD-10-CM | POA: Diagnosis not present

## 2014-12-12 DIAGNOSIS — R3919 Other difficulties with micturition: Secondary | ICD-10-CM | POA: Diagnosis not present

## 2014-12-12 DIAGNOSIS — Z79899 Other long term (current) drug therapy: Secondary | ICD-10-CM | POA: Diagnosis not present

## 2014-12-12 LAB — URINALYSIS, ROUTINE W REFLEX MICROSCOPIC
Bilirubin Urine: NEGATIVE
Glucose, UA: NEGATIVE mg/dL
KETONES UR: NEGATIVE mg/dL
LEUKOCYTES UA: NEGATIVE
Nitrite: NEGATIVE
PROTEIN: NEGATIVE mg/dL
Specific Gravity, Urine: 1.013 (ref 1.005–1.030)
Urobilinogen, UA: 0.2 mg/dL (ref 0.0–1.0)
pH: 6 (ref 5.0–8.0)

## 2014-12-12 LAB — URINE MICROSCOPIC-ADD ON

## 2014-12-12 NOTE — Discharge Instructions (Signed)
Acute Urinary Retention °Acute urinary retention is the temporary inability to urinate. °This is a common problem in older men. As men age their prostates become larger and block the flow of urine from the bladder. This is usually a problem that has come on gradually.  °HOME CARE INSTRUCTIONS °If you are sent home with a Foley catheter and a drainage system, you will need to discuss the best course of action with your health care provider. While the catheter is in, maintain a good intake of fluids. Keep the drainage bag emptied and lower than your catheter. This is so that contaminated urine will not flow back into your bladder, which could lead to a urinary tract infection. °There are two main types of drainage bags. One is a large bag that usually is used at night. It has a good capacity that will allow you to sleep through the night without having to empty it. The second type is called a leg bag. It has a smaller capacity, so it needs to be emptied more frequently. However, the main advantage is that it can be attached by a leg strap and can go underneath your clothing, allowing you the freedom to move about or leave your home. °Only take over-the-counter or prescription medicines for pain, discomfort, or fever as directed by your health care provider.  °SEEK MEDICAL CARE IF: °· You develop a low-grade fever. °· You experience spasms or leakage of urine with the spasms. °SEEK IMMEDIATE MEDICAL CARE IF:  °1. You develop chills or fever. °2. Your catheter stops draining urine. °3. Your catheter falls out. °4. You start to develop increased bleeding that does not respond to rest and increased fluid intake. °MAKE SURE YOU: °1. Understand these instructions. °2. Will watch your condition. °3. Will get help right away if you are not doing well or get worse. °Document Released: 06/07/2000 Document Revised: 03/06/2013 Document Reviewed: 08/10/2012 °ExitCare® Patient Information ©2015 [employer]. This information is  not intended to replace advice given to you by your health care provider. Make sure you discuss any questions you have with your health care provider. °Foley Catheter Care °A Foley catheter is a soft, flexible tube that is placed into the bladder to drain urine. A Foley catheter may be inserted if: °· You leak urine or are not able to control when you urinate (urinary incontinence). °· You are not able to urinate when you need to (urinary retention). °· You had prostate surgery or surgery on the genitals. °· You have certain medical conditions, such as multiple sclerosis, dementia, or a spinal cord injury. °If you are going home with a Foley catheter in place, follow the instructions below. °TAKING CARE OF THE CATHETER °5. Wash your hands with soap and water. °6. Using mild soap and warm water on a clean washcloth: °· Clean the area on your body closest to the catheter insertion site using a circular motion, moving away from the catheter. Never wipe toward the catheter because this could sweep bacteria up into the urethra and cause infection. °· Remove all traces of soap. Pat the area dry with a clean towel. For males, reposition the foreskin. °7. Attach the catheter to your leg so there is no tension on the catheter. Use adhesive tape or a leg strap. If you are using adhesive tape, remove any sticky residue left behind by the previous tape you used. °8. Keep the drainage bag below the level of the bladder, but keep it off the floor. °9. Check throughout   the day to be sure the catheter is working and urine is draining freely. Make sure the tubing does not become kinked. °10. Do not pull on the catheter or try to remove it. Pulling could damage internal tissues. °TAKING CARE OF THE DRAINAGE BAGS °You will be given two drainage bags to take home. One is a large overnight drainage bag, and the other is a smaller leg bag that fits underneath clothing. You may wear the overnight bag at any time, but you should never wear  the smaller leg bag at night. Follow the instructions below for how to empty, change, and clean your drainage bags. °Emptying the Drainage Bag °You must empty your drainage bag when it is  -½ full or at least 2-3 times a day. °4. Wash your hands with soap and water. °5. Keep the drainage bag below your hips, below the level of your bladder. This stops urine from going back into the tubing and into your bladder. °6. Hold the dirty bag over the toilet or a clean container. °7. Open the pour spout at the bottom of the bag and empty the urine into the toilet or container. Do not let the pour spout touch the toilet, container, or any other surface. Doing so can place bacteria on the bag, which can cause an infection. °8. Clean the pour spout with a gauze pad or cotton ball that has rubbing alcohol on it. °9. Close the pour spout. °10. Attach the bag to your leg with adhesive tape or a leg strap. °11. Wash your hands well. °Changing the Drainage Bag °Change your drainage bag once a month or sooner if it starts to smell bad or look dirty. Below are steps to follow when changing the drainage bag. °1. Wash your hands with soap and water. °2. Pinch off the rubber catheter so that urine does not spill out. °3. Disconnect the catheter tube from the drainage tube at the connection valve. Do not let the tubes touch any surface. °4. Clean the end of the catheter tube with an alcohol wipe. Use a different alcohol wipe to clean the end of the drainage tube. °5. Connect the catheter tube to the drainage tube of the clean drainage bag. °6. Attach the new bag to the leg with adhesive tape or a leg strap. Avoid attaching the new bag too tightly. °7. Wash your hands well. °Cleaning the Drainage Bag °1. Wash your hands with soap and water. °2. Wash the bag in warm, soapy water. °3. Rinse the bag thoroughly with warm water. °4. Fill the bag with a solution of white vinegar and water (1 cup vinegar to 1 qt warm water [.2 L vinegar to 1 L  warm water]). Close the bag and soak it for 30 minutes in the solution. °5. Rinse the bag with warm water. °6. Hang the bag to dry with the pour spout open and hanging downward. °7. Store the clean bag (once it is dry) in a clean plastic bag. °8. Wash your hands well. °PREVENTING INFECTION °· Wash your hands before and after handling your catheter. °· Take showers daily and wash the area where the catheter enters your body. Do not take baths. Replace wet leg straps with dry ones, if this applies. °· Do not use powders, sprays, or lotions on the genital area. Only use creams, lotions, or ointments as directed by your caregiver. °· For females, wipe from front to back after each bowel movement. °· Drink enough fluids to keep your urine   clear or pale yellow unless you have a fluid restriction. °· Do not let the drainage bag or tubing touch or lie on the floor. °· Wear cotton underwear to absorb moisture and to keep your skin drier. °SEEK MEDICAL CARE IF:  °· Your urine is cloudy or smells unusually bad. °· Your catheter becomes clogged. °· You are not draining urine into the bag or your bladder feels full. °· Your catheter starts to leak. °SEEK IMMEDIATE MEDICAL CARE IF:  °· You have pain, swelling, redness, or pus where the catheter enters the body. °· You have pain in the abdomen, legs, lower back, or bladder. °· You have a fever. °· You see blood fill the catheter, or your urine is pink or red. °· You have nausea, vomiting, or chills. °· Your catheter gets pulled out. °MAKE SURE YOU:  °· Understand these instructions. °· Will watch your condition. °· Will get help right away if you are not doing well or get worse. °Document Released: 03/01/2005 Document Revised: 07/16/2013 Document Reviewed: 02/21/2012 °ExitCare® Patient Information ©2015 [employer]. This information is not intended to replace advice given to you by your health care provider. Make sure you discuss any questions you have with your health care  provider. ° °

## 2014-12-12 NOTE — ED Provider Notes (Signed)
CSN: [id number]     Arrival date & time 12/12/14  0402 History   First MD Initiated Contact with Patient 12/12/14 0422     Chief Complaint  Patient presents with  . Urinary Retention     (Consider location/radiation/quality/duration/timing/severity/associated sxs/prior Treatment) HPI Comments: Patient with a history of prostate cancer, hypertension, hyperlipidemia presents with being unable to urinate since 10:30 last evening. He last urinated around 9:00 pm without difficulty but when he woke to return to the bathroom he was unable to pass any urine. No fever. He reports having nausea and diaphoresis without one episode of vomiting, but has no further nausea now.   The history is provided by the patient and the spouse. No language interpreter was used.    Past Medical History  Diagnosis Date  . Orthostatic hypotension   . Dizziness and giddiness     a. H/o dizziness with reportedly negative tilt table. Holter 2012: NSR, sinus tachy, frequent PVCs, multifocal at times in a trigeminal fashion.  . Severe aortic stenosis     a. 11/2009: pericardial AVR/Bentall.  . Thoracic aortic aneurysm     a. 11/2009: s/p Magna Ease pericardial tissue valve size 66mm and replacement of fusiform ascending thoracic aneurysm with 30-mm Hemashield cath with hemiarch reconstruction.  . Prostate cancer     a. s/p radiation.  . Chronic cystitis     a. With ongoing hematuria.  . Hematuria   . Obesity   . H/O cardiac catheterization     a. 2011: minor luminal irregularities. b. Nuc 03/2014: ow risk without reversible ischemia or infarction, EF 57%. (There is septal akinesis but otherwise normal wall motion. This is nonspecific.)  . Elevated BP     a. 03/2014: started on amlodipine 2/2 periodical markedly elevated BPs.  . Hyponatremia     a. Remote hx hyponatremia felt 2/2 SSRI.  Marland Kitchen Diverticulitis     a. 2010: diverticulitis with stricture Sigmoid colectomy with mobilization of the splenic flexure within the  end anastomosis.   Marland Kitchen BPH (benign prostatic hyperplasia)   . GERD (gastroesophageal reflux disease)   . Contrast media allergy    Past Surgical History  Procedure Laterality Date  . Tissue aortic valve replacement  12/11/09  . 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 Cholecystectomy  2010  . Colon surgery  2010    colon resection  . Doppler carotid  2012  . Doppler echocardiography  2011   Family History  Problem Relation Age of Onset  . Heart disease Father 43  . Stroke Mother 42  . Heart attack Father    Social History  Substance Use Topics  . Smoking status: Former Smoker    Types: Cigarettes    Quit date: 03/16/1967  . Smokeless tobacco: None  . Alcohol Use: No    Review of Systems  Constitutional: Negative for fever and chills.  Gastrointestinal: Positive for vomiting and abdominal pain.  Genitourinary: Positive for dysuria and difficulty urinating. Negative for scrotal swelling and testicular pain.  Musculoskeletal: Negative.  Negative for myalgias.  Neurological: Negative.       Allergies  Beta adrenergic blockers; Iodinated diagnostic agents; and Shellfish allergy  Home Medications   Prior to Admission medications   Medication Sig Start Date End Date Taking? Authorizing Provider  acetaminophen (TYLENOL) 500 MG tablet Take 500 mg by mouth every 6 (six) hours as needed  for mild pain.   Yes Historical Provider, MD  amLODipine (NORVASC) 5 MG tablet Take 10 mg by mouth daily.  05/06/14  Yes Historical Provider, MD  aspirin 81 MG EC tablet Take 81 mg by mouth daily.   Yes Historical Provider, MD  azelastine (ASTELIN) 0.1 % nasal spray Place 2 sprays into both nostrils 2 (two) times daily. 11/19/14  Yes Historical Provider, MD  finasteride (PROSCAR) 5 MG tablet Take 5 mg by mouth daily.   Yes Historical Provider, MD  lansoprazole (PREVACID) 15 MG capsule Take 15  mg by mouth daily at 12 noon.   Yes Historical Provider, MD  losartan (COZAAR) 100 MG tablet Take 100 mg by mouth daily.   Yes Historical Provider, MD  simvastatin (ZOCOR) 20 MG tablet Take 20 mg by mouth daily.    Yes Historical Provider, MD  Tamsulosin HCl (FLOMAX PO) Take 0.4 mg by mouth daily.   Yes Historical Provider, MD   BP 170/84 mmHg  Pulse 80  Temp(Src) 97.4 F (36.3 C) (Oral)  Resp 22  SpO2 95% Physical Exam  Constitutional: He is oriented to person, place, and time. He appears well-developed and well-nourished.  Neck: Normal range of motion.  Pulmonary/Chest: Effort normal.  Abdominal: There is no tenderness.  Genitourinary:  Circumcised penis. No testicular tenderness or scrotal swelling. Foley in place in urethral meatus.   Musculoskeletal: Normal range of motion.  Neurological: He is alert and oriented to person, place, and time.  Skin: Skin is warm and dry.  Psychiatric: He has a normal mood and affect.    ED Course  Procedures (including critical care time) Labs Review Labs Reviewed  URINALYSIS, ROUTINE W REFLEX MICROSCOPIC (NOT AT [facility]) - Abnormal; Notable for the following:    Hgb urine dipstick LARGE (*)    All other components within normal limits  URINE MICROSCOPIC-ADD ON    Imaging Review No results found. I have personally reviewed and evaluated these images and lab results as part of my medical decision-making.   EKG Interpretation None      MDM   Final diagnoses:  None    1. Urinary retention  Foley to remain in place until the patient can return to his urologist, Dr. Risa Grill. UA negative. VS stable. He can be discharged home.     Charlann Lange, PA-C 12/12/14 [id number]  Julianne Rice, MD 12/12/14 [phone]

## 2014-12-12 NOTE — ED Notes (Signed)
Pt having urinary retention and hematuria since 9pm

## 2014-12-13 LAB — URINE CULTURE: Culture: NO GROWTH

## 2014-12-19 DIAGNOSIS — R310 Gross hematuria: Secondary | ICD-10-CM | POA: Diagnosis not present

## 2014-12-19 DIAGNOSIS — R350 Frequency of micturition: Secondary | ICD-10-CM | POA: Diagnosis not present

## 2014-12-19 DIAGNOSIS — R339 Retention of urine, unspecified: Secondary | ICD-10-CM | POA: Diagnosis not present

## 2014-12-20 DIAGNOSIS — R339 Retention of urine, unspecified: Secondary | ICD-10-CM | POA: Diagnosis not present

## 2014-12-20 DIAGNOSIS — R310 Gross hematuria: Secondary | ICD-10-CM | POA: Diagnosis not present

## 2014-12-26 DIAGNOSIS — M1612 Unilateral primary osteoarthritis, left hip: Secondary | ICD-10-CM | POA: Diagnosis not present

## 2015-01-06 DIAGNOSIS — C61 Malignant neoplasm of prostate: Secondary | ICD-10-CM | POA: Diagnosis not present

## 2015-01-06 DIAGNOSIS — R310 Gross hematuria: Secondary | ICD-10-CM | POA: Diagnosis not present

## 2015-02-14 ENCOUNTER — Other Ambulatory Visit: Payer: Self-pay | Admitting: *Deleted

## 2015-02-14 DIAGNOSIS — I712 Thoracic aortic aneurysm, without rupture: Secondary | ICD-10-CM

## 2015-02-14 DIAGNOSIS — I7121 Aneurysm of the ascending aorta, without rupture: Secondary | ICD-10-CM

## 2015-02-14 DIAGNOSIS — Z952 Presence of prosthetic heart valve: Secondary | ICD-10-CM

## 2015-02-17 ENCOUNTER — Ambulatory Visit (INDEPENDENT_AMBULATORY_CARE_PROVIDER_SITE_OTHER): Payer: Medicare Other | Admitting: Cardiovascular Disease

## 2015-02-17 ENCOUNTER — Encounter: Payer: Self-pay | Admitting: Cardiovascular Disease

## 2015-02-17 VITALS — BP 114/70 | HR 56 | Ht 69.0 in | Wt 259.0 lb

## 2015-02-17 DIAGNOSIS — I712 Thoracic aortic aneurysm, without rupture, unspecified: Secondary | ICD-10-CM

## 2015-02-17 DIAGNOSIS — Z954 Presence of other heart-valve replacement: Secondary | ICD-10-CM | POA: Diagnosis not present

## 2015-02-17 DIAGNOSIS — I10 Essential (primary) hypertension: Secondary | ICD-10-CM

## 2015-02-17 DIAGNOSIS — I200 Unstable angina: Secondary | ICD-10-CM | POA: Diagnosis not present

## 2015-02-17 DIAGNOSIS — Z952 Presence of prosthetic heart valve: Secondary | ICD-10-CM

## 2015-02-17 DIAGNOSIS — E785 Hyperlipidemia, unspecified: Secondary | ICD-10-CM | POA: Diagnosis not present

## 2015-02-17 LAB — LIPID PANEL
CHOL/HDL RATIO: 4.2 ratio (ref <=5.0)
CHOLESTEROL: 129 mg/dL (ref 125–200)
HDL: 31 mg/dL — AB (ref >=40)
LDL CALC: 65 mg/dL (ref <130)
Triglycerides: 163 mg/dL — ABNORMAL HIGH (ref <150)
VLDL: 33 mg/dL — AB (ref <30)

## 2015-02-17 LAB — COMPREHENSIVE METABOLIC PANEL
ALT: 14 U/L (ref 9–46)
AST: 18 U/L (ref 10–35)
Albumin: 4.5 g/dL (ref 3.6–5.1)
Alkaline Phosphatase: 55 U/L (ref 40–115)
BUN: 18 mg/dL (ref 7–25)
CHLORIDE: 105 mmol/L (ref 98–110)
CO2: 26 mmol/L (ref 20–31)
Calcium: 9.3 mg/dL (ref 8.6–10.3)
Creat: 1.17 mg/dL (ref 0.70–1.18)
Glucose, Bld: 94 mg/dL (ref 65–99)
Potassium: 4 mmol/L (ref 3.5–5.3)
Sodium: 140 mmol/L (ref 135–146)
Total Bilirubin: 1.7 mg/dL — ABNORMAL HIGH (ref 0.2–1.2)
Total Protein: 7.3 g/dL (ref 6.1–8.1)

## 2015-02-17 NOTE — Patient Instructions (Signed)
Medication Instructions:  Your physician recommends that you continue on your current medications as directed. Please refer to the Current Medication list given to you today.   Labwork: TODAY - cholesterol, liver, basic metabolic panel  Your physician recommends that you return for lab work in: 1 year on the day of or a few days before your office visit with Dr. Acie Fredrickson.  You will need to FAST for this appointment - nothing to eat or drink after midnight the night before except water.   Testing/Procedures: None Ordered   Follow-Up: Your physician wants you to follow-up in: 1 year with Dr. Acie Fredrickson.  You will receive a reminder letter in the mail two months in advance. If you don't receive a letter, please call our office to schedule the follow-up appointment.  If you need a refill on your cardiac medications before your next appointment, please call your pharmacy.   Thank you for choosing [facility]! Christen Bame, RN [phone]

## 2015-02-17 NOTE — Progress Notes (Signed)
Cardiology Office Note   Date:  02/17/2015   ID:  Jeff Johnson, Jeff Johnson [birth year], MRN [id number]  PCP:  [name], MD  Cardiologist:   Thayer Headings, MD   Chief Complaint  Patient presents with  . Follow-up   1. Aortic stenosis/ aortic insufficiency-status post Bentall procedure- 2011 2. History of chronic cystitis 3. Hyperlipidemia 4. Dizziness - ? orthostasis. Negative tilt table study. 30 day monitor negative. Occurs with standing or sitting, did not improve wit Midodrine  History of Present Illness:  69 yo with hx of aortic valve disease - s/p Bentall procedure.   He's been having some problems with dizziness. He has seen Dr. Crissie Sickles. He's had a 30 day event monitor which was negative. He had a tilt table study which was negative. He has had episodes of dizziness while standing and with sitting. He usually has several seconds of warning. If he is driving his car he was able to pull over to the side of the road. He has tried Midodrine but does not tolerate it very well. It causes him to be anxious and also causes urinary retention. He also has been eating lots of salt and that helped some.  He complains of generalized fatigue. He does not sleep very well. He has had prostate cancer and has to go to the bathroom at least 3 times a night.  Nov. 6, 2014:   [name] is doing well. He is working part time which involves standing and walking for 8 hours. ( [employer]) No CP, no dyspnea.   May 21, 2014:  Jeff Johnson is a 69 y.o. male who presents for follow up of his Bentall procedure in 2011.   Has recorded his BP.   Readings are mostly ok.  No CP , no dyspnea  Dec. 5, 2016:  Doing ok from a cardiac standpoint.  Having some hip pain . Needs to have cardiac clearance.  Following up with Dr. Prescott Gum for an aortic aneurism.   Past Medical History  Diagnosis Date  . Orthostatic hypotension   . Dizziness and giddiness     a. H/o dizziness  with reportedly negative tilt table. Holter 2012: NSR, sinus tachy, frequent PVCs, multifocal at times in a trigeminal fashion.  . Severe aortic stenosis     a. 11/2009: pericardial AVR/Bentall.  . Thoracic aortic aneurysm ([place])     a. 11/2009: s/p Magna Ease pericardial tissue valve size 38mm and replacement of fusiform ascending thoracic aneurysm with 30-mm Hemashield cath with hemiarch reconstruction.  . Prostate cancer ([place])     a. s/p radiation.  . Chronic cystitis     a. With ongoing hematuria.  . Hematuria   . Obesity   . H/O cardiac catheterization     a. 2011: minor luminal irregularities. b. Nuc 03/2014: ow risk without reversible ischemia or infarction, EF 57%. (There is septal akinesis but otherwise normal wall motion. This is nonspecific.)  . Elevated BP     a. 03/2014: started on amlodipine 2/2 periodical markedly elevated BPs.  . Hyponatremia     a. Remote hx hyponatremia felt 2/2 SSRI.  Marland Kitchen Diverticulitis     a. 2010: diverticulitis with stricture Sigmoid colectomy with mobilization of the splenic flexure within the end anastomosis.   Marland Kitchen BPH (benign prostatic hyperplasia)   . GERD (gastroesophageal reflux disease)   . Contrast media allergy   . Benign essential HTN 05/21/2014  . Hyperlipidemia 03/18/2014  . SYNCOPE 02/18/2010    Qualifier:  Diagnosis of  By: Lovena Le, MD, Martyn Malay   . Dyspnea on exertion 03/18/2014  . 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 Tissue aortic valve replacement  12/11/09  . 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 Cholecystectomy  2010  . Colon surgery  2010    colon resection  . Doppler carotid  2012  . Doppler echocardiography  2011     Current Outpatient Prescriptions  Medication Sig Dispense Refill  . acetaminophen (TYLENOL) 500 MG tablet Take 500 mg by mouth every 6  (six) hours as needed for mild pain.    Marland Kitchen amLODipine (NORVASC) 10 MG tablet Take 10 mg by mouth daily.  3  . aspirin 81 MG EC tablet Take 81 mg by mouth daily.    Marland Kitchen azelastine (ASTELIN) 0.1 % nasal spray Place 2 sprays into both nostrils 2 (two) times daily.  11  . finasteride (PROSCAR) 5 MG tablet Take 5 mg by mouth daily.    . lansoprazole (PREVACID) 15 MG capsule Take 30 mg by mouth daily at 12 noon.     Marland Kitchen losartan (COZAAR) 100 MG tablet Take 100 mg by mouth daily.    . simvastatin (ZOCOR) 20 MG tablet Take 20 mg by mouth daily.     . Tamsulosin HCl (FLOMAX PO) Take 0.4 mg by mouth daily.     No current facility-administered medications for this visit.    Allergies:   Beta adrenergic blockers; Iodinated diagnostic agents; and Shellfish allergy    Social History:  The patient  reports that he quit smoking about 47 years ago. His smoking use included Cigarettes. He does not have any smokeless tobacco history on file. He reports that he does not drink alcohol.   Family History:  The patient's family history includes Heart attack in his father; Heart disease (age of onset: 38) in his father; Stroke (age of onset: 65) in his mother.    ROS:  Please see the history of present illness.    Review of Systems: Constitutional:  denies fever, chills, diaphoresis, appetite change and fatigue.  HEENT: denies photophobia, eye pain, redness, hearing loss, ear pain, congestion, sore throat, rhinorrhea, sneezing, neck pain, neck stiffness and tinnitus.  Respiratory: denies SOB, DOE, cough, chest tightness, and wheezing.  Cardiovascular: denies chest pain, palpitations and leg swelling.  Gastrointestinal: denies nausea, vomiting, abdominal pain, diarrhea, constipation, blood in stool.  Genitourinary: denies dysuria, urgency, frequency, hematuria, flank pain and difficulty urinating.  Musculoskeletal: denies  myalgias, back pain, joint swelling, arthralgias and gait problem.   Skin: denies pallor,  rash and wound.  Neurological: denies dizziness, seizures, syncope, weakness, light-headedness, numbness and headaches.   Hematological: denies adenopathy, easy bruising, personal or family bleeding history.  Psychiatric/ Behavioral: denies suicidal ideation, mood changes, confusion, nervousness, sleep disturbance and agitation.       All other systems are reviewed and negative.    PHYSICAL EXAM: VS:  BP 114/70 mmHg  Pulse 56  Ht 5\' 9"  (1.753 m)  Wt 259 lb (117.482 kg)  BMI 38.23 kg/m2 , BMI Body mass index is 38.23 kg/(m^2). GEN: Well nourished, well developed, in no acute distress HEENT: normal Neck: no JVD, carotid bruits, or masses Cardiac: RRR; no murmurs, rubs, or gallops,no edema  Respiratory:  clear to auscultation bilaterally, normal work of breathing GI:  soft, nontender, nondistended, + BS MS: no deformity or atrophy Skin: warm and dry, no rash Neuro:  Strength and sensation are intact Psych: normal   EKG:  EKG is ordered today.   Sinus brady at 56.   Inf. Q waves.    Recent Labs: 03/18/2014: B Natriuretic Peptide 101.5*; BUN 10; Creatinine, Ser 1.05; Hemoglobin 14.7; Platelets 165; Potassium 4.0; Sodium 137    Lipid Panel    Component Value Date/Time   CHOL 159 03/18/2014 1822   TRIG 140 03/18/2014 1822   HDL 38* 03/18/2014 1822   CHOLHDL 4.2 03/18/2014 1822   VLDL 28 03/18/2014 1822   LDLCALC 93 03/18/2014 1822      Wt Readings from Last 3 Encounters:  02/17/15 259 lb (117.482 kg)  05/21/14 254 lb 12.8 oz (115.577 kg)  04/24/14 250 lb (113.399 kg)      Other studies Reviewed: Additional studies/ records that were reviewed today include: . Review of the above records demonstrates:    ASSESSMENT AND PLAN:  1. Aortic stenosis/ aortic insufficiency-status post Bentall procedure- 2011, followed by Dr. Prescott Gum.  He needs to have hip surgery. From my standpoint he is stable from a cardiac standpoint. He has normal left ventricular systolic  function. He does have some diastolic function. He should be at low risk for his upcoming hip surgery.  2. History of chronic cystitis 3. Hyperlipidemia -  Lipids are ok.  Encouraged a better diet and weight loss plan   4. Dizziness -  5.   Essential Hypertension:  BP is ok.  Encouraged him to lose weight .    Current medicines are reviewed at length with the patient today.  The patient does not have concerns regarding medicines.  The following changes have been made:  no change   Disposition:   FU with me in 1 year.     Signed, Nahser, Wonda Cheng, MD  02/17/2015 11:54 AM    [facility] [place], [place], [place]  [address] Phone: [phone]; Fax: [phone]

## 2015-02-18 DIAGNOSIS — I10 Essential (primary) hypertension: Secondary | ICD-10-CM | POA: Diagnosis not present

## 2015-02-18 DIAGNOSIS — M1612 Unilateral primary osteoarthritis, left hip: Secondary | ICD-10-CM | POA: Diagnosis not present

## 2015-02-18 DIAGNOSIS — M169 Osteoarthritis of hip, unspecified: Secondary | ICD-10-CM | POA: Diagnosis not present

## 2015-02-18 DIAGNOSIS — R0600 Dyspnea, unspecified: Secondary | ICD-10-CM | POA: Diagnosis not present

## 2015-03-12 ENCOUNTER — Other Ambulatory Visit: Payer: Medicare Other

## 2015-03-12 ENCOUNTER — Encounter: Payer: Medicare Other | Admitting: Cardiothoracic Surgery

## 2015-03-12 ENCOUNTER — Ambulatory Visit
Admission: RE | Admit: 2015-03-12 | Discharge: 2015-03-12 | Disposition: A | Discharge status: Home / Self Care | Payer: Medicare Other | Source: Ambulatory Visit | Attending: Cardiothoracic Surgery | Admitting: Cardiothoracic Surgery

## 2015-03-12 DIAGNOSIS — R911 Solitary pulmonary nodule: Secondary | ICD-10-CM | POA: Diagnosis not present

## 2015-03-12 DIAGNOSIS — Z952 Presence of prosthetic heart valve: Secondary | ICD-10-CM

## 2015-03-12 DIAGNOSIS — I7121 Aneurysm of the ascending aorta, without rupture: Secondary | ICD-10-CM

## 2015-03-12 DIAGNOSIS — I712 Thoracic aortic aneurysm, without rupture: Secondary | ICD-10-CM | POA: Diagnosis not present

## 2015-03-13 ENCOUNTER — Encounter: Payer: Self-pay | Admitting: Cardiothoracic Surgery

## 2015-03-13 ENCOUNTER — Ambulatory Visit (INDEPENDENT_AMBULATORY_CARE_PROVIDER_SITE_OTHER): Payer: Medicare Other | Admitting: Cardiothoracic Surgery

## 2015-03-13 VITALS — BP 120/70 | HR 58 | Resp 20 | Ht 69.0 in | Wt 259.0 lb

## 2015-03-13 DIAGNOSIS — Z48812 Encounter for surgical aftercare following surgery on the circulatory system: Secondary | ICD-10-CM | POA: Diagnosis not present

## 2015-03-13 DIAGNOSIS — I200 Unstable angina: Secondary | ICD-10-CM

## 2015-03-13 DIAGNOSIS — I712 Thoracic aortic aneurysm, without rupture, unspecified: Secondary | ICD-10-CM

## 2015-03-13 DIAGNOSIS — I350 Nonrheumatic aortic (valve) stenosis: Secondary | ICD-10-CM

## 2015-03-13 NOTE — Progress Notes (Signed)
PCP is DEWEY,ELIZABETH, MD Referring Provider is [name], MD  Chief Complaint  Patient presents with  . Follow-up    1 year f/u with CT Chest   Patient examined and chest CT scan, prior 2-D echocardiogram performed where this year personally and independently reviewed   HPI:69 year old Caucasian male status post aVR-aortic root replacement for severe aortic stenosis and a root aneurysm 2011. Serial surveillance CT scans following surgery showed the remaining thoracic aorta to be of normal caliber without evidence of false aneurysm. Echocardiogram performed earlier this year shows normal LV function, normal unction of his bioprosthetic aortic valve. He remains in sinus rhythm. He has no symptoms of chest pain or CHF.  Main complaint is arthritic pain of his left hip. This will be treated with total hip replacement in January.  Because of the patient's difficulty walking, his previous complex cardiac operation and history of prostate cancer with persistent difficulty voiding he is not able to fill his obligation for jury duty in late January and he was provided a note.   Past Medical History  Diagnosis Date  . Orthostatic hypotension   . Dizziness and giddiness     a. H/o dizziness with reportedly negative tilt table. Holter 2012: NSR, sinus tachy, frequent PVCs, multifocal at times in a trigeminal fashion.  . Severe aortic stenosis     a. 11/2009: pericardial AVR/Bentall.  . Thoracic aortic aneurysm ([place])     a. 11/2009: s/p Magna Ease pericardial tissue valve size 57mm and replacement of fusiform ascending thoracic aneurysm with 30-mm Hemashield cath with hemiarch reconstruction.  . Prostate cancer ([place])     a. s/p radiation.  . Chronic cystitis     a. With ongoing hematuria.  . Hematuria   . Obesity   . H/O cardiac catheterization     a. 2011: minor luminal irregularities. b. Nuc 03/2014: ow risk without reversible ischemia or infarction, EF 57%. (There is septal akinesis  but otherwise normal wall motion. This is nonspecific.)  . Elevated BP     a. 03/2014: started on amlodipine 2/2 periodical markedly elevated BPs.  . Hyponatremia     a. Remote hx hyponatremia felt 2/2 SSRI.  Marland Kitchen Diverticulitis     a. 2010: diverticulitis with stricture Sigmoid colectomy with mobilization of the splenic flexure within the end anastomosis.   Marland Kitchen BPH (benign prostatic hyperplasia)   . GERD (gastroesophageal reflux disease)   . Contrast media allergy   . Benign essential HTN 05/21/2014  . Hyperlipidemia 03/18/2014  . SYNCOPE 02/18/2010    Qualifier: Diagnosis of  By: Lovena Le, MD, Martyn Malay   . Dyspnea on exertion 03/18/2014  . 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 Tissue aortic valve replacement  12/11/09  . 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 Cholecystectomy  2010  . Colon surgery  2010    colon resection  . Doppler carotid  2012  . Doppler echocardiography  2011    Family History  Problem Relation Age of Onset  . Heart disease Father 86  . Stroke Mother 76  . Heart attack Father     Social History Social History  Substance Use Topics  . Smoking status: Former Smoker    Types: Cigarettes    Quit date: 03/16/1967  . Smokeless tobacco: None  . Alcohol Use:  No    Current Outpatient Prescriptions  Medication Sig Dispense Refill  . acetaminophen (TYLENOL) 500 MG tablet Take 500 mg by mouth every 6 (six) hours as needed for mild pain.    Marland Kitchen amLODipine (NORVASC) 10 MG tablet Take 10 mg by mouth daily.  3  . aspirin 81 MG EC tablet Take 81 mg by mouth daily.    . finasteride (PROSCAR) 5 MG tablet Take 5 mg by mouth daily.    . lansoprazole (PREVACID) 15 MG capsule Take 30 mg by mouth daily at 12 noon.     Marland Kitchen losartan (COZAAR) 100 MG tablet Take 100 mg by mouth daily.    . simvastatin  (ZOCOR) 20 MG tablet Take 20 mg by mouth daily.     . Tamsulosin HCl (FLOMAX PO) Take 0.4 mg by mouth daily.     No current facility-administered medications for this visit.      Review of Systems  Review of Systems :  [ y ] = yes, [  ] = no        General :  Weight gain [   ]    Weight loss  [   ]  Fatigue [  ]  Fever [  ]  Chills  [  ]                                Weakness  [  ]           Cardiac :  Chest pain/ pressure [  ]  Resting SOB [  ] exertional SOB [ yes ]                        Orthopnea [  ]  Pedal edema  [  ]  Palpitations [  ] Syncope/presyncope [ ]                         Paroxysmal nocturnal dyspnea [  ]        Pulmonary : cough [  ]  wheezing [  ]  Hemoptysis [  ] Sputum [  ] Snoring [  ]                              Pneumothorax [  ]  Sleep apnea [  ]       GI : Vomiting [  ]  Dysphagia [  ]  Melena  [  ]  Abdominal pain [  ] BRBPR [  ]              Heart burn [  ]  Constipation [  ] Diarrhea  [  ] Colonoscopy [  ]       GU : Hematuria [  ]  Dysuria [ yes ]  Nocturia [  ] UTI's [place]  ]       Vascular : Claudication [  ]  Rest pain [  ]  DVT [  ] Vein stripping [  ] leg ulcers [  ]                          TIA [  ] Stroke [  ]  Varicose veins [  ]       NEURO :  Headaches  [  ]  Seizures [  ] Vision changes [  ] Paresthesias [  ]       Musculoskeletal :  Arthritis [ yes-left hip ] Gout  [  ]  Back pain [  ]  Joint pain [  ]       Skin :  Rash [  ]  Melanoma [  ]        Heme : Bleeding problems [  ]Clotting Disorders [  ] Anemia [  ]Blood Transfusion [ ]        Endocrine : Diabetes [  ] Thyroid Disorder  [  ]       Psych : Depression [  ]  Anxiety [  ]  Psych hospitalizations [  ]         Orthostatic dizziness has improved                                      BP 120/70 mmHg  Pulse 58  Resp 20  Ht 5\' 9"  (1.753 m)  Wt 259 lb (117.482 kg)  BMI 38.23 kg/m2  SpO2 98% Physical Exam     Physical Exam  General: middle-aged obese Caucasian male no acute distress  using a cane to walk because of left hip pain HEENT: Normocephalic pupils equal , dentition adequate Neck: Supple without JVD, adenopathy, or bruit Chest: Clear to auscultation, symmetrical breath sounds, no rhonchi, no tenderness             or deformity Cardiovascular: Regular rate and rhythm, soft flow murmur through aortic valve,, no gallop, peripheral pulses             palpable in all extremities Abdomen:  Soft, nontender, no palpable mass or organomegaly Extremities: Warm, well-perfused, no clubbing cyanosis edema or tenderness,              no venous stasis changes of the legs Rectal/GU: Deferred Neuro: Grossly non--focal and symmetrical throughout Skin: Clean and dry without rash or ulceration   Diagnostic Tests: CT scan of the thoracic aorta shows normal size residual aortic arch and descending thoracic aorta, aortic root replacement is intact without pseudoaneurysm. No pleural effusion or edema.   Previous echocardiogram performed earlier in 2016 shows good LV function, normal functioning a bioprosthetic aortic valve.  Impression: Patient is doing well. His cardiac status is adequate for general anesthesia and left total hip replacement in the near future. He could tolerate short-term anticoagulation as is often recommended after total hip replacement.  Plan: Patient returns for further review in 12-18 months.  Len Childs, MD [facility] [phone]

## 2015-03-19 ENCOUNTER — Ambulatory Visit: Payer: Self-pay | Admitting: Orthopedic Surgery

## 2015-04-07 ENCOUNTER — Ambulatory Visit: Payer: Self-pay | Admitting: Orthopedic Surgery

## 2015-04-07 NOTE — H&P (Signed)
TOTAL HIP ADMISSION H&P  Patient is admitted for left total hip arthroplasty.  Subjective:  Chief Complaint: left hip pain  HPI: Jeff Johnson, 70 y.o. male, has a history of pain and functional disability in the left hip(s) due to arthritis and patient has failed non-surgical conservative treatments for greater than 12 weeks to include NSAID's and/or analgesics, corticosteriod injections, flexibility and strengthening excercises, use of assistive devices, weight reduction as appropriate and activity modification.  Onset of symptoms was gradual starting 2 years ago with gradually worsening course since that time.The patient noted no past surgery on the left hip(s).  Patient currently rates pain in the left hip at 10 out of 10 with activity. Patient has night pain, worsening of pain with activity and weight bearing, pain that interfers with activities of daily living and pain with passive range of motion. Patient has evidence of subchondral sclerosis, periarticular osteophytes and joint space narrowing by imaging studies. This condition presents safety issues increasing the risk of falls. There is no current active infection.  Patient Active Problem List   Diagnosis Date Noted  . Benign essential HTN 05/21/2014  . Orthostatic hypotension   . Elevated BP   . BPH (benign prostatic hyperplasia)   . Diverticulitis   . Hyponatremia   . H/O cardiac catheterization   . Hematuria   . Severe aortic stenosis s/p pericardial AVR, Bentall in 2011   . Thoracic aortic aneurysm ([place])   . Contrast media allergy   . Obesity   . Prostate cancer ([place])   . Chronic cystitis   . 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 Dyspnea on exertion 03/18/2014  . Hyperlipidemia 03/18/2014  . GERD (gastroesophageal reflux disease) 03/18/2014  . SYNCOPE 02/18/2010  . ORTHOSTATIC DIZZINESS 02/18/2010   Past Medical History  Diagnosis Date  . Orthostatic hypotension   . Dizziness and giddiness     a. H/o  dizziness with reportedly negative tilt table. Holter 2012: NSR, sinus tachy, frequent PVCs, multifocal at times in a trigeminal fashion.  . Severe aortic stenosis     a. 11/2009: pericardial AVR/Bentall.  . Thoracic aortic aneurysm ([place])     a. 11/2009: s/p Magna Ease pericardial tissue valve size 84mm and replacement of fusiform ascending thoracic aneurysm with 30-mm Hemashield cath with hemiarch reconstruction.  . Prostate cancer ([place])     a. s/p radiation.  . Chronic cystitis     a. With ongoing hematuria.  . Hematuria   . Obesity   . H/O cardiac catheterization     a. 2011: minor luminal irregularities. b. Nuc 03/2014: ow risk without reversible ischemia or infarction, EF 57%. (There is septal akinesis but otherwise normal wall motion. This is nonspecific.)  . Elevated BP     a. 03/2014: started on amlodipine 2/2 periodical markedly elevated BPs.  . Hyponatremia     a. Remote hx hyponatremia felt 2/2 SSRI.  Marland Kitchen Diverticulitis     a. 2010: diverticulitis with stricture Sigmoid colectomy with mobilization of the splenic flexure within the end anastomosis.   Marland Kitchen BPH (benign prostatic hyperplasia)   . GERD (gastroesophageal reflux disease)   . Contrast media allergy   . Benign essential HTN 05/21/2014  . Hyperlipidemia 03/18/2014  . SYNCOPE 02/18/2010    Qualifier: Diagnosis of  By: Lovena Le, MD, Martyn Malay   . Dyspnea on exertion 03/18/2014  . 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 Tissue aortic valve replacement  12/11/09  . 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 Cholecystectomy  2010  . Colon surgery  2010    colon resection  . Doppler carotid  2012  . Doppler echocardiography  2011     (Not in a hospital admission)  ALLERGIES: shell fish   Social History  Substance Use Topics  . Smoking status: Former  Smoker    Types: Cigarettes    Quit date: 03/16/1967  . Smokeless tobacco: Not on file  . Alcohol Use: No    Family History  Problem Relation Age of Onset  . Heart disease Father 2  . Stroke Mother 26  . Heart attack Father      Review of Systems  Constitutional: Positive for chills and malaise/fatigue.  HENT: Positive for hearing loss and tinnitus. Negative for congestion, ear discharge, ear pain, nosebleeds and sore throat.   Respiratory: Positive for shortness of breath. Negative for stridor.   Cardiovascular: Negative.   Gastrointestinal: Negative.   Genitourinary: Positive for frequency and hematuria. Negative for dysuria, urgency and flank pain.  Skin: Negative.   Neurological: Negative.  Negative for headaches.  Endo/Heme/Allergies: Negative.   Psychiatric/Behavioral: Negative.     Objective:  Physical Exam  Vitals reviewed. Constitutional: He is oriented to person, place, and time. He appears well-developed and well-nourished.  HENT:  Head: Normocephalic and atraumatic.  Eyes: Conjunctivae and EOM are normal. Pupils are equal, round, and reactive to light.  Neck: Normal range of motion. Neck supple.  Cardiovascular: Normal rate, regular rhythm, normal heart sounds and intact distal pulses.   Respiratory: Effort normal and breath sounds normal. No respiratory distress.  GI: Soft. Bowel sounds are normal. He exhibits distension.  Genitourinary:  deferred  Musculoskeletal:       Left hip: He exhibits decreased range of motion and decreased strength.  Neurological: He is alert and oriented to person, place, and time. He has normal reflexes.  Skin: Skin is warm and dry.  Psychiatric: He has a normal mood and affect. His behavior is normal. Judgment and thought content normal.    Vital signs in last 24 hours: @VSRANGES @  Labs:   Estimated body mass index is 38.23 kg/(m^2) as calculated from the following:   Height as of 03/13/15: 5\' 9"  (1.753 m).   Weight as  of 03/13/15: 117.482 kg (259 lb).   Imaging Review Plain radiographs demonstrate severe degenerative joint disease of the left hip(s). The bone quality appears to be adequate for age and reported activity level.  Assessment/Plan:  End stage arthritis, left hip(s)  The patient history, physical examination, clinical judgement of the provider and imaging studies are consistent with end stage degenerative joint disease of the left hip(s) and total hip arthroplasty is deemed medically necessary. The treatment options including medical management, injection therapy, arthroscopy and arthroplasty were discussed at length. The risks and benefits of total hip arthroplasty were presented and reviewed. The risks due to aseptic loosening, infection, stiffness, dislocation/subluxation,  thromboembolic complications and other imponderables were discussed.  The patient acknowledged the explanation, agreed to proceed with the plan and consent was signed. Patient is being admitted for inpatient treatment for surgery, pain control, PT, OT, prophylactic antibiotics, VTE prophylaxis, progressive ambulation and ADL's and discharge planning.The patient is planning to be discharged home with home health services

## 2015-04-09 ENCOUNTER — Encounter (HOSPITAL_COMMUNITY): Payer: Self-pay

## 2015-04-09 NOTE — Patient Instructions (Addendum)
YOUR PROCEDURE IS SCHEDULED ON :  04/17/15  REPORT TO [facility] MAIN ENTRANCE FOLLOW SIGNS TO EAST ELEVATOR - GO TO 3rd FLOOR CHECK IN AT 3 EAST NURSES STATION (SHORT STAY) AT:  2:15 PM  CALL THIS NUMBER IF YOU HAVE PROBLEMS THE MORNING OF SURGERY [phone]  REMEMBER:ONLY 1 PER PERSON MAY GO TO SHORT STAY WITH YOU TO GET READY THE MORNING OF YOUR SURGERY  DO NOT EAT FOOD  AFTER MIDNIGHT  MAY HAVE CLEAR LIQUIDS UNTIL 10:15 AM  TAKE THESE MEDICINES THE MORNING OF SURGERY:  AMLODIPINE / PREVICID / FLOMAX / ZOCOR    CLEAR LIQUID DIET   Foods Allowed                                                                     Foods Excluded  Coffee and tea, regular and decaf                             liquids that you cannot  Plain Jell-O in any flavor                                             see through such as: Fruit ices (not with fruit pulp)                                     milk, soups, orange juice  Iced Popsicles                                                          All solid food Carbonated beverages, regular and diet                                    Cranberry, grape and apple juices Sports drinks like Gatorade Lightly seasoned clear broth or consume(fat free) Sugar, honey syrup  _____________________________________________________________________    YOU MAY NOT HAVE ANY METAL ON YOUR BODY INCLUDING HAIR PINS AND PIERCING'S. DO NOT WEAR JEWELRY, MAKEUP, LOTIONS, POWDERS OR PERFUMES. DO NOT WEAR NAIL POLISH. DO NOT SHAVE 48 HRS PRIOR TO SURGERY. MEN MAY SHAVE FACE AND NECK.  DO NOT [place]. [facility] IS NOT RESPONSIBLE FOR VALUABLES.  CONTACTS, DENTURES OR PARTIALS MAY NOT BE WORN TO SURGERY. LEAVE SUITCASE IN CAR. CAN BE BROUGHT TO ROOM AFTER SURGERY.  PATIENTS DISCHARGED THE DAY OF SURGERY WILL NOT BE ALLOWED TO DRIVE HOME.  PLEASE READ OVER THE FOLLOWING INSTRUCTION  SHEETS _________________________________________________________________________________                                          [facility] - PREPARING  FOR SURGERY  Before surgery, you can play an important role.  Because skin is not sterile, your skin needs to be as free of germs as possible.  You can reduce the number of germs on your skin by washing with CHG (chlorahexidine gluconate) soap before surgery.  CHG is an antiseptic cleaner which kills germs and bonds with the skin to continue killing germs even after washing. Please DO NOT use if you have an allergy to CHG or antibacterial soaps.  If your skin becomes reddened/irritated stop using the CHG and inform your nurse when you arrive at Short Stay. Do not shave (including legs and underarms) for at least 48 hours prior to the first CHG shower.  You may shave your face. Please follow these instructions carefully:   1.  Shower with CHG Soap the night before surgery and the  morning of Surgery.   2.  If you choose to wash your hair, wash your hair first as usual with your  normal  Shampoo.   3.  After you shampoo, rinse your hair and body thoroughly to remove the  shampoo.                                         4.  Use CHG as you would any other liquid soap.  You can apply chg directly  to the skin and wash . Gently wash with scrungie or clean wascloth    5.  Apply the CHG Soap to your body ONLY FROM THE NECK DOWN.   Do not use on open                           Wound or open sores. Avoid contact with eyes, ears mouth and genitals (private parts).                        Genitals (private parts) with your normal soap.              6.  Wash thoroughly, paying special attention to the area where your surgery  will be performed.   7.  Thoroughly rinse your body with warm water from the neck down.   8.  DO NOT shower/wash with your normal soap after using and rinsing off  the CHG Soap .                9.  Pat yourself dry with a clean  towel.             10.  Wear clean night clothes to bed after shower             11.  Place clean sheets on your bed the night of your first shower and do not  sleep with pets.  Day of Surgery : Do not apply any lotions/deodorants the morning of surgery.  Please wear clean clothes to the hospital/surgery center.  FAILURE TO FOLLOW THESE INSTRUCTIONS MAY RESULT IN THE CANCELLATION OF YOUR SURGERY    PATIENT SIGNATURE_________________________________  ______________________________________________________________________     Adam Phenix  An incentive spirometer is a tool that can help keep your lungs clear and active. This tool measures how well you are filling your lungs with each breath. Taking long deep breaths may help reverse or decrease the chance of developing breathing (pulmonary) problems (especially infection) following:  A long period of time when you are unable to move or be active. BEFORE THE PROCEDURE   If the spirometer includes an indicator to show your best effort, your nurse or respiratory therapist will set it to a desired goal.  If possible, sit up straight or lean slightly forward. Try not to slouch.  Hold the incentive spirometer in an upright position. INSTRUCTIONS FOR USE   Sit on the edge of your bed if possible, or sit up as far as you can in bed or on a chair.  Hold the incentive spirometer in an upright position.  Breathe out normally.  Place the mouthpiece in your mouth and seal your lips tightly around it.  Breathe in slowly and as deeply as possible, raising the piston or the ball toward the top of the column.  Hold your breath for 3-5 seconds or for as long as possible. Allow the piston or ball to fall to the bottom of the column.  Remove the mouthpiece from your mouth and breathe out normally.  Rest for a few seconds and repeat Steps 1 through 7 at least 10 times every 1-2 hours when you are awake. Take your time and take a few  normal breaths between deep breaths.  The spirometer may include an indicator to show your best effort. Use the indicator as a goal to work toward during each repetition.  After each set of 10 deep breaths, practice coughing to be sure your lungs are clear. If you have an incision (the cut made at the time of surgery), support your incision when coughing by placing a pillow or rolled up towels firmly against it. Once you are able to get out of bed, walk around indoors and cough well. You may stop using the incentive spirometer when instructed by your caregiver.  RISKS AND COMPLICATIONS  Take your time so you do not get dizzy or light-headed.  If you are in pain, you may need to take or ask for pain medication before doing incentive spirometry. It is harder to take a deep breath if you are having pain. AFTER USE  Rest and breathe slowly and easily.  It can be helpful to keep track of a log of your progress. Your caregiver can provide you with a simple table to help with this. If you are using the spirometer at home, follow these instructions: [place] IF:   You are having difficultly using the spirometer.  You have trouble using the spirometer as often as instructed.  Your pain medication is not giving enough relief while using the spirometer.  You develop fever of 100.5 F (38.1 C) or higher. SEEK IMMEDIATE MEDICAL CARE IF:   You cough up bloody sputum that had not been present before.  You develop fever of 102 F (38.9 C) or greater.  You develop worsening pain at or near the incision site. MAKE SURE YOU:   Understand these instructions.  Will watch your condition.  Will get help right away if you are not doing well or get worse. Document Released: 07/12/2006 Document Revised: 05/24/2011 Document Reviewed: 09/12/2006 ExitCare Patient Information 2014 ExitCare, [place].   ________________________________________________________________________  WHAT IS A BLOOD  TRANSFUSION? Blood Transfusion Information  A transfusion is the replacement of blood or some of its parts. Blood is made up of multiple cells which provide different functions.  Red blood cells carry oxygen and are used for blood loss replacement.  White blood cells fight against infection.  Platelets control bleeding.  Plasma helps clot blood.  Other blood products are available for specialized needs, such as hemophilia or other clotting disorders. BEFORE THE TRANSFUSION  Who gives blood for transfusions?   Healthy volunteers who are fully evaluated to make sure their blood is safe. This is blood bank blood. Transfusion therapy is the safest it has ever been in the practice of medicine. Before blood is taken from a donor, a complete history is taken to make sure that person has no history of diseases nor engages in risky social behavior (examples are intravenous drug use or sexual activity with multiple partners). The donor's travel history is screened to minimize risk of transmitting infections, such as malaria. The donated blood is tested for signs of infectious diseases, such as HIV and hepatitis. The blood is then tested to be sure it is compatible with you in order to minimize the chance of a transfusion reaction. If you or a relative donates blood, this is often done in anticipation of surgery and is not appropriate for emergency situations. It takes many days to process the donated blood. RISKS AND COMPLICATIONS Although transfusion therapy is very safe and saves many lives, the main dangers of transfusion include:   Getting an infectious disease.  Developing a transfusion reaction. This is an allergic reaction to something in the blood you were given. Every precaution is taken to prevent this. The decision to have a blood transfusion has been considered carefully by your caregiver before blood is given. Blood is not given unless the benefits outweigh the risks. AFTER THE  TRANSFUSION  Right after receiving a blood transfusion, you will usually feel much better and more energetic. This is especially true if your red blood cells have gotten low (anemic). The transfusion raises the level of the red blood cells which carry oxygen, and this usually causes an energy increase.  The nurse administering the transfusion will monitor you carefully for complications. HOME CARE INSTRUCTIONS  No special instructions are needed after a transfusion. You may find your energy is better. Speak with your caregiver about any limitations on activity for underlying diseases you may have. SEEK MEDICAL CARE IF:   Your condition is not improving after your transfusion.  You develop redness or irritation at the intravenous (IV) site. SEEK IMMEDIATE MEDICAL CARE IF:  Any of the following symptoms occur over the next 12 hours:  Shaking chills.  You have a temperature by mouth above 102 F (38.9 C), not controlled by medicine.  Chest, back, or muscle pain.  People around you feel you are not acting correctly or are confused.  Shortness of breath or difficulty breathing.  Dizziness and fainting.  You get a rash or develop hives.  You have a decrease in urine output.  Your urine turns a dark color or changes to pink, red, or brown. Any of the following symptoms occur over the next 10 days:  You have a temperature by mouth above 102 F (38.9 C), not controlled by medicine.  Shortness of breath.  Weakness after normal activity.  The white part of the eye turns yellow (jaundice).  You have a decrease in the amount of urine or are urinating less often.  Your urine turns a dark color or changes to pink, red, or brown. Document Released: 02/27/2000 Document Revised: 05/24/2011 Document Reviewed: 10/16/2007 [facility] Patient Information 2014 [place], [place].  _______________________________________________________________________

## 2015-04-10 ENCOUNTER — Encounter (HOSPITAL_COMMUNITY): Payer: Self-pay

## 2015-04-10 ENCOUNTER — Encounter (HOSPITAL_COMMUNITY)
Admission: RE | Admit: 2015-04-10 | Discharge: 2015-04-10 | Disposition: A | Discharge status: Home / Self Care | Payer: Medicare Other | Source: Ambulatory Visit | Attending: Orthopedic Surgery | Admitting: Orthopedic Surgery

## 2015-04-10 DIAGNOSIS — Z01812 Encounter for preprocedural laboratory examination: Secondary | ICD-10-CM | POA: Insufficient documentation

## 2015-04-10 DIAGNOSIS — K219 Gastro-esophageal reflux disease without esophagitis: Secondary | ICD-10-CM | POA: Diagnosis not present

## 2015-04-10 DIAGNOSIS — E669 Obesity, unspecified: Secondary | ICD-10-CM | POA: Diagnosis not present

## 2015-04-10 HISTORY — DX: Unspecified osteoarthritis, unspecified site: M19.90

## 2015-04-10 HISTORY — DX: Personal history of other specified conditions: Z87.898

## 2015-04-10 HISTORY — DX: Nocturia: R35.1

## 2015-04-10 HISTORY — DX: Frequency of micturition: R35.0

## 2015-04-10 HISTORY — DX: Rash and other nonspecific skin eruption: R21

## 2015-04-10 HISTORY — DX: Headache, unspecified: R51.9

## 2015-04-10 HISTORY — DX: Headache: R51

## 2015-04-10 LAB — COMPREHENSIVE METABOLIC PANEL
ALT: 18 U/L (ref 17–63)
AST: 22 U/L (ref 15–41)
Albumin: 4.3 g/dL (ref 3.5–5.0)
Alkaline Phosphatase: 54 U/L (ref 38–126)
Anion gap: 8 (ref 5–15)
BUN: 18 mg/dL (ref 6–20)
CHLORIDE: 106 mmol/L (ref 101–111)
CO2: 23 mmol/L (ref 22–32)
CREATININE: 0.95 mg/dL (ref 0.61–1.24)
Calcium: 8.8 mg/dL — ABNORMAL LOW (ref 8.9–10.3)
Glucose, Bld: 100 mg/dL — ABNORMAL HIGH (ref 65–99)
POTASSIUM: 3.7 mmol/L (ref 3.5–5.1)
SODIUM: 137 mmol/L (ref 135–145)
Total Bilirubin: 1.9 mg/dL — ABNORMAL HIGH (ref 0.3–1.2)
Total Protein: 7.4 g/dL (ref 6.5–8.1)

## 2015-04-10 LAB — PROTIME-INR
INR: 1.27 (ref 0.00–1.49)
PROTHROMBIN TIME: 15.5 s — AB (ref 11.6–15.2)

## 2015-04-10 LAB — URINALYSIS, ROUTINE W REFLEX MICROSCOPIC
BILIRUBIN URINE: NEGATIVE
GLUCOSE, UA: NEGATIVE mg/dL
HGB URINE DIPSTICK: NEGATIVE
KETONES UR: NEGATIVE mg/dL
LEUKOCYTES UA: NEGATIVE
Nitrite: NEGATIVE
PH: 6 (ref 5.0–8.0)
PROTEIN: NEGATIVE mg/dL
Specific Gravity, Urine: 1.017 (ref 1.005–1.030)

## 2015-04-10 LAB — CBC
HCT: 36.1 % — ABNORMAL LOW (ref 39.0–52.0)
Hemoglobin: 12.3 g/dL — ABNORMAL LOW (ref 13.0–17.0)
MCH: 29.5 pg (ref 26.0–34.0)
MCHC: 34.1 g/dL (ref 30.0–36.0)
MCV: 86.6 fL (ref 78.0–100.0)
PLATELETS: 179 10*3/uL (ref 150–400)
RBC: 4.17 MIL/uL — AB (ref 4.22–5.81)
RDW: 12.6 % (ref 11.5–15.5)
WBC: 5 10*3/uL (ref 4.0–10.5)

## 2015-04-10 LAB — SURGICAL PCR SCREEN
MRSA, PCR: NEGATIVE
Staphylococcus aureus: NEGATIVE

## 2015-04-10 LAB — APTT: aPTT: 30 seconds (ref 24–37)

## 2015-04-10 LAB — ABO/RH: ABO/RH(D): O POS

## 2015-04-17 ENCOUNTER — Inpatient Hospital Stay (HOSPITAL_COMMUNITY): Payer: Medicare Other

## 2015-04-17 ENCOUNTER — Inpatient Hospital Stay (HOSPITAL_COMMUNITY): Payer: Medicare Other | Admitting: Anesthesiology

## 2015-04-17 ENCOUNTER — Inpatient Hospital Stay (HOSPITAL_COMMUNITY)
Admission: RE | Admit: 2015-04-17 | Discharge: 2015-04-20 | DRG: 470 | Disposition: A | Discharge status: Home Health | Payer: Medicare Other | Source: Ambulatory Visit | Attending: Orthopedic Surgery | Admitting: Orthopedic Surgery

## 2015-04-17 ENCOUNTER — Encounter (HOSPITAL_COMMUNITY)
Admission: RE | Disposition: A | Discharge status: Home Health | Payer: Self-pay | Source: Ambulatory Visit | Attending: Orthopedic Surgery

## 2015-04-17 ENCOUNTER — Encounter (HOSPITAL_COMMUNITY): Payer: Self-pay | Admitting: *Deleted

## 2015-04-17 DIAGNOSIS — M1612 Unilateral primary osteoarthritis, left hip: Principal | ICD-10-CM

## 2015-04-17 DIAGNOSIS — Z9049 Acquired absence of other specified parts of digestive tract: Secondary | ICD-10-CM

## 2015-04-17 DIAGNOSIS — Z8546 Personal history of malignant neoplasm of prostate: Secondary | ICD-10-CM | POA: Diagnosis not present

## 2015-04-17 DIAGNOSIS — Z923 Personal history of irradiation: Secondary | ICD-10-CM | POA: Diagnosis not present

## 2015-04-17 DIAGNOSIS — Z6838 Body mass index (BMI) 38.0-38.9, adult: Secondary | ICD-10-CM | POA: Diagnosis not present

## 2015-04-17 DIAGNOSIS — Z87891 Personal history of nicotine dependence: Secondary | ICD-10-CM | POA: Diagnosis not present

## 2015-04-17 DIAGNOSIS — Z01812 Encounter for preprocedural laboratory examination: Secondary | ICD-10-CM | POA: Diagnosis not present

## 2015-04-17 DIAGNOSIS — M25552 Pain in left hip: Secondary | ICD-10-CM

## 2015-04-17 DIAGNOSIS — K219 Gastro-esophageal reflux disease without esophagitis: Secondary | ICD-10-CM | POA: Diagnosis present

## 2015-04-17 DIAGNOSIS — Z952 Presence of prosthetic heart valve: Secondary | ICD-10-CM | POA: Diagnosis not present

## 2015-04-17 DIAGNOSIS — Z09 Encounter for follow-up examination after completed treatment for conditions other than malignant neoplasm: Secondary | ICD-10-CM

## 2015-04-17 DIAGNOSIS — I10 Essential (primary) hypertension: Secondary | ICD-10-CM | POA: Diagnosis not present

## 2015-04-17 DIAGNOSIS — Z96642 Presence of left artificial hip joint: Secondary | ICD-10-CM | POA: Diagnosis not present

## 2015-04-17 DIAGNOSIS — Z471 Aftercare following joint replacement surgery: Secondary | ICD-10-CM | POA: Diagnosis not present

## 2015-04-17 DIAGNOSIS — M169 Osteoarthritis of hip, unspecified: Secondary | ICD-10-CM | POA: Diagnosis not present

## 2015-04-17 HISTORY — PX: TOTAL HIP ARTHROPLASTY: SHX124

## 2015-04-17 LAB — TYPE AND SCREEN
ABO/RH(D): O POS
ANTIBODY SCREEN: NEGATIVE

## 2015-04-17 LAB — PROTIME-INR
INR: 1.14 (ref 0.00–1.49)
PROTHROMBIN TIME: 14.8 s (ref 11.6–15.2)

## 2015-04-17 SURGERY — ARTHROPLASTY, HIP, TOTAL, ANTERIOR APPROACH
Anesthesia: General | Site: Hip | Laterality: Left

## 2015-04-17 MED ORDER — DEXAMETHASONE SODIUM PHOSPHATE 10 MG/ML IJ SOLN
INTRAMUSCULAR | Status: DC | PRN
  Administered 2015-04-17: 10 mg via INTRAVENOUS

## 2015-04-17 MED ORDER — CHLORHEXIDINE GLUCONATE 4 % EX LIQD: 60.0000 mL | Freq: Once | CUTANEOUS | Status: DC

## 2015-04-17 MED ORDER — SODIUM CHLORIDE 0.9 % IJ SOLN
INTRAMUSCULAR | Status: DC | PRN
  Administered 2015-04-17: 30 mL

## 2015-04-17 MED ORDER — EPHEDRINE SULFATE 50 MG/ML IJ SOLN
INTRAMUSCULAR | Status: DC | PRN
  Administered 2015-04-17 (×2): 5 mg via INTRAVENOUS

## 2015-04-17 MED ORDER — KETOROLAC TROMETHAMINE 15 MG/ML IJ SOLN
7.5000 mg | Freq: Four times a day (QID) | INTRAMUSCULAR | Status: AC
  Administered 2015-04-17 – 2015-04-18 (×4): 7.5 mg via INTRAVENOUS
  Filled 2015-04-17 (×3): qty 1

## 2015-04-17 MED ORDER — SUGAMMADEX SODIUM 200 MG/2ML IV SOLN
INTRAVENOUS | Status: DC | PRN
  Administered 2015-04-17: 250 mg via INTRAVENOUS

## 2015-04-17 MED ORDER — KETOROLAC TROMETHAMINE 30 MG/ML IJ SOLN
INTRAMUSCULAR | Status: AC
  Filled 2015-04-17: qty 1

## 2015-04-17 MED ORDER — OXYCODONE HCL 5 MG PO TABS: 5.0000 mg | ORAL_TABLET | Freq: Once | ORAL | Status: DC | PRN

## 2015-04-17 MED ORDER — LIDOCAINE HCL (CARDIAC) 20 MG/ML IV SOLN
INTRAVENOUS | Status: AC
  Filled 2015-04-17: qty 5

## 2015-04-17 MED ORDER — PANTOPRAZOLE SODIUM 40 MG PO TBEC
40.0000 mg | DELAYED_RELEASE_TABLET | Freq: Every day | ORAL | Status: DC
  Administered 2015-04-18 – 2015-04-20 (×3): 40 mg via ORAL
  Filled 2015-04-17 (×4): qty 1

## 2015-04-17 MED ORDER — SODIUM CHLORIDE 0.9 % IJ SOLN
INTRAMUSCULAR | Status: AC
  Filled 2015-04-17: qty 50

## 2015-04-17 MED ORDER — LACTATED RINGERS IV SOLN
INTRAVENOUS | Status: DC
  Administered 2015-04-17: 1000 mL via INTRAVENOUS
  Administered 2015-04-17 (×2): via INTRAVENOUS

## 2015-04-17 MED ORDER — PROPOFOL 10 MG/ML IV BOLUS
INTRAVENOUS | Status: DC | PRN
  Administered 2015-04-17: 200 mg via INTRAVENOUS

## 2015-04-17 MED ORDER — SODIUM CHLORIDE 0.9 % IV SOLN
INTRAVENOUS | Status: DC
  Administered 2015-04-17 – 2015-04-18 (×2): via INTRAVENOUS

## 2015-04-17 MED ORDER — SODIUM CHLORIDE 0.9 % IR SOLN
Status: DC | PRN
  Administered 2015-04-17: 4000 mL

## 2015-04-17 MED ORDER — SUFENTANIL CITRATE 50 MCG/ML IV SOLN
INTRAVENOUS | Status: DC | PRN
  Administered 2015-04-17 (×3): 10 ug via INTRAVENOUS
  Administered 2015-04-17: 20 ug via INTRAVENOUS

## 2015-04-17 MED ORDER — METOCLOPRAMIDE HCL 5 MG/ML IJ SOLN
5.0000 mg | Freq: Three times a day (TID) | INTRAMUSCULAR | Status: DC | PRN
  Administered 2015-04-18: 5 mg via INTRAVENOUS
  Filled 2015-04-17: qty 2

## 2015-04-17 MED ORDER — DEXAMETHASONE SODIUM PHOSPHATE 10 MG/ML IJ SOLN
10.0000 mg | Freq: Once | INTRAMUSCULAR | Status: AC
  Administered 2015-04-18: 10 mg via INTRAVENOUS
  Filled 2015-04-17: qty 1

## 2015-04-17 MED ORDER — METOCLOPRAMIDE HCL 10 MG PO TABS: 5.0000 mg | ORAL_TABLET | Freq: Three times a day (TID) | ORAL | Status: DC | PRN

## 2015-04-17 MED ORDER — BUPIVACAINE-EPINEPHRINE 0.25% -1:200000 IJ SOLN
INTRAMUSCULAR | Status: DC | PRN
  Administered 2015-04-17: 30 mL

## 2015-04-17 MED ORDER — METHOCARBAMOL 1000 MG/10ML IJ SOLN
500.0000 mg | Freq: Four times a day (QID) | INTRAVENOUS | Status: DC | PRN
  Administered 2015-04-17: 500 mg via INTRAVENOUS
  Filled 2015-04-17 (×2): qty 5

## 2015-04-17 MED ORDER — METHOCARBAMOL 500 MG PO TABS
500.0000 mg | ORAL_TABLET | Freq: Four times a day (QID) | ORAL | Status: DC | PRN
  Administered 2015-04-18 – 2015-04-20 (×7): 500 mg via ORAL
  Filled 2015-04-17 (×8): qty 1

## 2015-04-17 MED ORDER — HYDROMORPHONE HCL 1 MG/ML IJ SOLN
0.2500 mg | INTRAMUSCULAR | Status: DC | PRN
  Administered 2015-04-17: 0.25 mg via INTRAVENOUS

## 2015-04-17 MED ORDER — ACETAMINOPHEN 650 MG RE SUPP: 650.0000 mg | Freq: Four times a day (QID) | RECTAL | Status: DC | PRN

## 2015-04-17 MED ORDER — CEFAZOLIN SODIUM-DEXTROSE 2-3 GM-% IV SOLR
2.0000 g | INTRAVENOUS | Status: AC
  Administered 2015-04-17: 2 g via INTRAVENOUS

## 2015-04-17 MED ORDER — SODIUM CHLORIDE 0.9 % IJ SOLN
INTRAMUSCULAR | Status: AC
  Filled 2015-04-17: qty 30

## 2015-04-17 MED ORDER — DEXAMETHASONE SODIUM PHOSPHATE 10 MG/ML IJ SOLN
INTRAMUSCULAR | Status: AC
  Filled 2015-04-17: qty 1

## 2015-04-17 MED ORDER — ONDANSETRON HCL 4 MG PO TABS: 4.0000 mg | ORAL_TABLET | Freq: Four times a day (QID) | ORAL | Status: DC | PRN

## 2015-04-17 MED ORDER — SUGAMMADEX SODIUM 500 MG/5ML IV SOLN
INTRAVENOUS | Status: AC
  Filled 2015-04-17: qty 5

## 2015-04-17 MED ORDER — SODIUM CHLORIDE 0.9 % IJ SOLN
INTRAMUSCULAR | Status: AC
  Filled 2015-04-17: qty 10

## 2015-04-17 MED ORDER — SIMVASTATIN 20 MG PO TABS
20.0000 mg | ORAL_TABLET | Freq: Every day | ORAL | Status: DC
  Administered 2015-04-18 – 2015-04-19 (×2): 20 mg via ORAL
  Filled 2015-04-17 (×3): qty 1

## 2015-04-17 MED ORDER — SUFENTANIL CITRATE 50 MCG/ML IV SOLN
INTRAVENOUS | Status: AC
  Filled 2015-04-17: qty 1

## 2015-04-17 MED ORDER — ROCURONIUM BROMIDE 100 MG/10ML IV SOLN
INTRAVENOUS | Status: DC | PRN
  Administered 2015-04-17: 10 mg via INTRAVENOUS
  Administered 2015-04-17: 20 mg via INTRAVENOUS
  Administered 2015-04-17 (×2): 10 mg via INTRAVENOUS
  Administered 2015-04-17: 50 mg via INTRAVENOUS

## 2015-04-17 MED ORDER — ACETAMINOPHEN 10 MG/ML IV SOLN
INTRAVENOUS | Status: AC
  Filled 2015-04-17: qty 100

## 2015-04-17 MED ORDER — HYDROCODONE-ACETAMINOPHEN 5-325 MG PO TABS
1.0000 | ORAL_TABLET | ORAL | Status: DC | PRN
  Administered 2015-04-18 (×2): 2 via ORAL
  Filled 2015-04-17 (×2): qty 2

## 2015-04-17 MED ORDER — AMLODIPINE BESYLATE 10 MG PO TABS
10.0000 mg | ORAL_TABLET | Freq: Every day | ORAL | Status: DC
  Administered 2015-04-19 – 2015-04-20 (×2): 10 mg via ORAL
  Filled 2015-04-17 (×3): qty 1

## 2015-04-17 MED ORDER — BUPIVACAINE-EPINEPHRINE (PF) 0.25% -1:200000 IJ SOLN
INTRAMUSCULAR | Status: AC
  Filled 2015-04-17: qty 30

## 2015-04-17 MED ORDER — SODIUM CHLORIDE 0.9 % IV SOLN: INTRAVENOUS | Status: DC

## 2015-04-17 MED ORDER — FINASTERIDE 5 MG PO TABS: 5.0000 mg | ORAL_TABLET | Freq: Every day | ORAL | Status: DC

## 2015-04-17 MED ORDER — ONDANSETRON HCL 4 MG/2ML IJ SOLN
4.0000 mg | Freq: Four times a day (QID) | INTRAMUSCULAR | Status: DC | PRN
  Administered 2015-04-18: 4 mg via INTRAVENOUS
  Filled 2015-04-17: qty 2

## 2015-04-17 MED ORDER — MEPERIDINE HCL 50 MG/ML IJ SOLN: 6.2500 mg | INTRAMUSCULAR | Status: DC | PRN

## 2015-04-17 MED ORDER — TAMSULOSIN HCL 0.4 MG PO CAPS
0.4000 mg | ORAL_CAPSULE | Freq: Every morning | ORAL | Status: DC
  Administered 2015-04-18 – 2015-04-20 (×3): 0.4 mg via ORAL
  Filled 2015-04-17 (×3): qty 1

## 2015-04-17 MED ORDER — DOCUSATE SODIUM 100 MG PO CAPS
100.0000 mg | ORAL_CAPSULE | Freq: Two times a day (BID) | ORAL | Status: DC
  Administered 2015-04-18 – 2015-04-20 (×5): 100 mg via ORAL

## 2015-04-17 MED ORDER — MENTHOL 3 MG MT LOZG: 1.0000 | LOZENGE | OROMUCOSAL | Status: DC | PRN

## 2015-04-17 MED ORDER — CEFAZOLIN SODIUM-DEXTROSE 2-3 GM-% IV SOLR
INTRAVENOUS | Status: AC
Start: 2015-04-17 — End: 2015-04-17
  Filled 2015-04-17: qty 50

## 2015-04-17 MED ORDER — SUCCINYLCHOLINE CHLORIDE 20 MG/ML IJ SOLN
INTRAMUSCULAR | Status: DC | PRN
  Administered 2015-04-17: 100 mg via INTRAVENOUS

## 2015-04-17 MED ORDER — HYDROMORPHONE HCL 1 MG/ML IJ SOLN
0.5000 mg | INTRAMUSCULAR | Status: DC | PRN
  Administered 2015-04-17 – 2015-04-18 (×5): 0.5 mg via INTRAVENOUS
  Filled 2015-04-17 (×5): qty 1

## 2015-04-17 MED ORDER — KETOROLAC TROMETHAMINE 30 MG/ML IJ SOLN
INTRAMUSCULAR | Status: DC | PRN
  Administered 2015-04-17: 30 mg

## 2015-04-17 MED ORDER — HYDROMORPHONE HCL 1 MG/ML IJ SOLN
INTRAMUSCULAR | Status: AC
  Filled 2015-04-17: qty 1

## 2015-04-17 MED ORDER — ASPIRIN EC 325 MG PO TBEC
325.0000 mg | DELAYED_RELEASE_TABLET | Freq: Two times a day (BID) | ORAL | Status: DC
  Administered 2015-04-18 – 2015-04-20 (×5): 325 mg via ORAL
  Filled 2015-04-17 (×7): qty 1

## 2015-04-17 MED ORDER — ACETAMINOPHEN 10 MG/ML IV SOLN
1000.0000 mg | Freq: Once | INTRAVENOUS | Status: AC
  Administered 2015-04-17: 1000 mg via INTRAVENOUS

## 2015-04-17 MED ORDER — PHENOL 1.4 % MT LIQD
1.0000 | OROMUCOSAL | Status: DC | PRN
Start: 2015-04-17 — End: 2015-04-20
  Filled 2015-04-17: qty 177

## 2015-04-17 MED ORDER — SENNA 8.6 MG PO TABS
2.0000 | ORAL_TABLET | Freq: Every day | ORAL | Status: DC
  Administered 2015-04-18 – 2015-04-19 (×2): 17.2 mg via ORAL

## 2015-04-17 MED ORDER — 0.9 % SODIUM CHLORIDE (POUR BTL) OPTIME
TOPICAL | Status: DC | PRN
  Administered 2015-04-17: 1000 mL

## 2015-04-17 MED ORDER — ACETAMINOPHEN 325 MG PO TABS
650.0000 mg | ORAL_TABLET | Freq: Four times a day (QID) | ORAL | Status: DC | PRN
Start: 2015-04-17 — End: 2015-04-20

## 2015-04-17 MED ORDER — LOSARTAN POTASSIUM 50 MG PO TABS
50.0000 mg | ORAL_TABLET | Freq: Every day | ORAL | Status: DC
Start: 2015-04-18 — End: 2015-04-20
  Administered 2015-04-18 – 2015-04-20 (×3): 50 mg via ORAL
  Filled 2015-04-17 (×3): qty 1

## 2015-04-17 MED ORDER — LIDOCAINE HCL (CARDIAC) 20 MG/ML IV SOLN
INTRAVENOUS | Status: DC | PRN
  Administered 2015-04-17: 75 mg via INTRAVENOUS

## 2015-04-17 MED ORDER — ONDANSETRON HCL 4 MG/2ML IJ SOLN
INTRAMUSCULAR | Status: DC | PRN
  Administered 2015-04-17: 4 mg via INTRAVENOUS

## 2015-04-17 MED ORDER — PROPOFOL 10 MG/ML IV BOLUS
INTRAVENOUS | Status: AC
  Filled 2015-04-17: qty 20

## 2015-04-17 MED ORDER — HYDROMORPHONE HCL 1 MG/ML IJ SOLN
INTRAMUSCULAR | Status: DC | PRN
  Administered 2015-04-17 (×5): .4 mg via INTRAVENOUS

## 2015-04-17 MED ORDER — ONDANSETRON HCL 4 MG/2ML IJ SOLN
INTRAMUSCULAR | Status: AC
  Filled 2015-04-17: qty 2

## 2015-04-17 MED ORDER — OXYCODONE HCL 5 MG/5ML PO SOLN
5.0000 mg | Freq: Once | ORAL | Status: DC | PRN
Start: 2015-04-17 — End: 2015-04-17
  Filled 2015-04-17: qty 5

## 2015-04-17 MED ORDER — KETOROLAC TROMETHAMINE 15 MG/ML IJ SOLN
INTRAMUSCULAR | Status: AC
  Filled 2015-04-17: qty 1

## 2015-04-17 MED ORDER — HYDROMORPHONE HCL 2 MG/ML IJ SOLN
INTRAMUSCULAR | Status: AC
  Filled 2015-04-17: qty 1

## 2015-04-17 MED ORDER — SODIUM CHLORIDE 0.9 % IV SOLN
1000.0000 mg | INTRAVENOUS | Status: AC
  Administered 2015-04-17: 1000 mg via INTRAVENOUS
  Filled 2015-04-17: qty 10

## 2015-04-17 MED ORDER — CEFAZOLIN SODIUM-DEXTROSE 2-3 GM-% IV SOLR
2.0000 g | Freq: Four times a day (QID) | INTRAVENOUS | Status: AC
  Administered 2015-04-17 – 2015-04-18 (×2): 2 g via INTRAVENOUS
  Filled 2015-04-17 (×2): qty 50

## 2015-04-17 SURGICAL SUPPLY — 45 items
BAG DECANTER FOR FLEXI CONT (MISCELLANEOUS) IMPLANT
BAG SPEC THK2 15X12 ZIP CLS (MISCELLANEOUS)
BAG ZIPLOCK 12X15 (MISCELLANEOUS) IMPLANT
CAPT HIP TOTAL 2 ×2 IMPLANT
CATH FOLEY 2WAY 5CC 16FR (CATHETERS) ×3
CATH URTH STD 16FR FL 2W DRN (CATHETERS) IMPLANT
CHLORAPREP W/TINT 26ML (MISCELLANEOUS) ×3 IMPLANT
CLOTH BEACON ORANGE TIMEOUT ST (SAFETY) ×3 IMPLANT
COVER PERINEAL POST (MISCELLANEOUS) ×3 IMPLANT
DECANTER SPIKE VIAL GLASS SM (MISCELLANEOUS) ×3 IMPLANT
DRAPE LG THREE QUARTER DISP (DRAPES) ×6 IMPLANT
DRAPE STERI IOBAN 125X83 (DRAPES) ×1 IMPLANT
DRAPE U-SHAPE 47X51 STRL (DRAPES) ×6 IMPLANT
DRSG AQUACEL AG ADV 3.5X10 (GAUZE/BANDAGES/DRESSINGS) ×3 IMPLANT
ELECT REM PT RETURN 15FT ADLT (MISCELLANEOUS) ×3 IMPLANT
GAUZE SPONGE 4X4 12PLY STRL (GAUZE/BANDAGES/DRESSINGS) ×3 IMPLANT
GLOVE BIO SURGEON STRL SZ8.5 (GLOVE) ×6 IMPLANT
GLOVE BIOGEL PI IND STRL 8.5 (GLOVE) ×1 IMPLANT
GLOVE BIOGEL PI INDICATOR 8.5 (GLOVE) ×2
GOWN SPEC L3 XXLG W/TWL (GOWN DISPOSABLE) ×3 IMPLANT
HANDPIECE INTERPULSE COAX TIP (DISPOSABLE) ×3
HOLDER FOLEY CATH W/STRAP (MISCELLANEOUS) ×3 IMPLANT
HOOD PEEL AWAY FLYTE STAYCOOL (MISCELLANEOUS) ×6 IMPLANT
LIQUID BAND (GAUZE/BANDAGES/DRESSINGS) ×4 IMPLANT
MARKER SKIN DUAL TIP RULER LAB (MISCELLANEOUS) ×3 IMPLANT
NDL SPNL 18GX3.5 QUINCKE PK (NEEDLE) ×1 IMPLANT
NEEDLE SPNL 18GX3.5 QUINCKE PK (NEEDLE) ×3 IMPLANT
PACK ANTERIOR HIP CUSTOM (KITS) ×3 IMPLANT
SAW OSC TIP CART 19.5X105X1.3 (SAW) ×3 IMPLANT
SEALER BIPOLAR AQUA 6.0 (INSTRUMENTS) ×3 IMPLANT
SET HNDPC FAN SPRY TIP SCT (DISPOSABLE) ×1 IMPLANT
SOL PREP POV-IOD 4OZ 10% (MISCELLANEOUS) ×1 IMPLANT
SUT ETHIBOND NAB CT1 #1 30IN (SUTURE) ×6 IMPLANT
SUT MNCRL AB 3-0 PS2 18 (SUTURE) ×3 IMPLANT
SUT MON AB 2-0 CT1 36 (SUTURE) ×6 IMPLANT
SUT VIC AB 1 CT1 36 (SUTURE) ×3 IMPLANT
SUT VIC AB 2-0 CT1 27 (SUTURE) ×3
SUT VIC AB 2-0 CT1 TAPERPNT 27 (SUTURE) ×1 IMPLANT
SUT VLOC 180 0 24IN GS25 (SUTURE) ×3 IMPLANT
SYR 50ML LL SCALE MARK (SYRINGE) ×3 IMPLANT
TRAY FOLEY W/METER SILVER 14FR (SET/KITS/TRAYS/PACK) IMPLANT
TRAY FOLEY W/METER SILVER 16FR (SET/KITS/TRAYS/PACK) ×2 IMPLANT
WATER STERILE IRR 1500ML POUR (IV SOLUTION) ×3 IMPLANT
YANKAUER SUCT BULB TIP 10FT TU (MISCELLANEOUS) ×3 IMPLANT
YANKAUER SUCT BULB TIP NO VENT (SUCTIONS) ×2 IMPLANT

## 2015-04-17 NOTE — Anesthesia Postprocedure Evaluation (Signed)
Anesthesia Post Note  Patient: Jeff Johnson  Procedure(s) Performed: Procedure(s) (LRB): LEFT TOTAL HIP ARTHROPLASTY ANTERIOR APPROACH (Left)  Patient location during evaluation: PACU Anesthesia Type: General Level of consciousness: awake and alert Pain management: pain level controlled Vital Signs Assessment: post-procedure vital signs reviewed and stable Respiratory status: spontaneous breathing, nonlabored ventilation, respiratory function stable and patient connected to nasal cannula oxygen Cardiovascular status: blood pressure returned to baseline and stable Postop Assessment: no signs of nausea or vomiting Anesthetic complications: no    Last Vitals:  Filed Vitals:   04/17/15 2100 04/17/15 2115  BP: 132/82   Pulse: 78   Temp: 36.4 C 36.4 C  Resp: 12     Last Pain:  Filed Vitals:   04/17/15 2119  PainSc: Asleep                 Lotoya Casella A

## 2015-04-17 NOTE — Anesthesia Procedure Notes (Signed)
Procedure Name: Intubation Date/Time: 04/17/2015 4:30 PM Performed by: Danley Danker L Patient Re-evaluated:Patient Re-evaluated prior to inductionOxygen Delivery Method: Circle system utilized Preoxygenation: Pre-oxygenation with 100% oxygen Intubation Type: IV induction Ventilation: Mask ventilation without difficulty and Oral airway inserted - appropriate to patient size Laryngoscope Size: Miller and 3 Grade View: Grade II Tube type: Oral Tube size: 8.0 mm Number of attempts: 1 Airway Equipment and Method: Stylet Placement Confirmation: ETT inserted through vocal cords under direct vision,  breath sounds checked- equal and bilateral and positive ETCO2 Secured at: 22 cm Tube secured with: Tape Dental Injury: Teeth and Oropharynx as per pre-operative assessment

## 2015-04-17 NOTE — Interval H&P Note (Signed)
History and Physical Interval Note:  04/17/2015 3:29 PM  Jeff Johnson  has presented today for surgery, with the diagnosis of left hip osteoarthritis  The various methods of treatment have been discussed with the patient and family. After consideration of risks, benefits and other options for treatment, the patient has consented to  Procedure(s): LEFT TOTAL HIP ARTHROPLASTY ANTERIOR APPROACH (Left) as a surgical intervention .  The patient's history has been reviewed, patient examined, no change in status, stable for surgery.  I have reviewed the patient's chart and labs.  Questions were answered to the patient's satisfaction.     Jeff Johnson, Horald Pollen

## 2015-04-17 NOTE — Transfer of Care (Signed)
Immediate Anesthesia Transfer of Care Note  Patient: Jeff Johnson  Procedure(s) Performed: Procedure(s): LEFT TOTAL HIP ARTHROPLASTY ANTERIOR APPROACH (Left)  Patient Location: PACU  Anesthesia Type:General  Level of Consciousness: sedated  Airway & Oxygen Therapy: Patient Spontanous Breathing and Patient connected to face mask oxygen  Post-op Assessment: Report given to RN and Post -op Vital signs reviewed and stable  Post vital signs: Reviewed and stable  Last Vitals:  Filed Vitals:   04/17/15 1356  BP: 139/61  Pulse: 53  Temp: 36.4 C  Resp: 18    Complications: No apparent anesthesia complications

## 2015-04-17 NOTE — Discharge Summary (Signed)
Physician Discharge Summary  Patient ID: Jeff Johnson MRN: [id number] DOB/AGE: [birth year] 70 y.o.  Admit date: 04/17/2015 Discharge date: 04/19/2015  Admission Diagnoses:  Primary osteoarthritis of left hip  Discharge Diagnoses:  Principal Problem:   Primary osteoarthritis of left hip   Past Medical History  Diagnosis Date  . Orthostatic hypotension   . Dizziness and giddiness     a. H/o dizziness with reportedly negative tilt table. Holter 2012: NSR, sinus tachy, frequent PVCs, multifocal at times in a trigeminal fashion.  . Severe aortic stenosis     a. 11/2009: pericardial AVR/[place].  . Thoracic aortic aneurysm ([place])     a. 11/2009: s/p Magna Ease pericardial tissue valve size 58mm and replacement of fusiform ascending thoracic aneurysm with 30-mm Hemashield cath with hemiarch reconstruction.  . Prostate cancer ([place])     a. s/p radiation.  . Chronic cystitis     a. With ongoing hematuria.  . Hematuria   . Obesity   . H/O cardiac catheterization     a. 2011: minor luminal irregularities. b. Nuc 03/2014: ow risk without reversible ischemia or infarction, EF 57%. (There is septal akinesis but otherwise normal wall motion. This is nonspecific.)  . Hyponatremia     a. Remote hx hyponatremia felt 2/2 SSRI.  Marland Kitchen Diverticulitis     a. 2010: diverticulitis with stricture Sigmoid colectomy with mobilization of the splenic flexure within the end anastomosis.   Marland Kitchen BPH (benign prostatic hyperplasia)   . GERD (gastroesophageal reflux disease)   . Contrast media allergy   . Benign essential HTN 05/21/2014  . Hyperlipidemia 03/18/2014  . SYNCOPE 02/18/2010    Qualifier: Diagnosis of  By: Lovena Le, MD, Martyn Malay   . Dyspnea on exertion 03/18/2014  . 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 Headache     MIGRAINES  . Arthritis   . Rash     LEFT KNEE   . Frequency of urination   . History of urinary retention   . Nocturia     Surgeries: Procedure(s): LEFT TOTAL HIP ARTHROPLASTY  ANTERIOR APPROACH on 04/17/2015   Consultants (if any):    Discharged Condition: Improved  Hospital Course: Jeff Johnson is an 70 y.o. male who was admitted 04/17/2015 with a diagnosis of Primary osteoarthritis of left hip and went to the operating room on 04/17/2015 and underwent the above named procedures.    He was given perioperative antibiotics:      Anti-infectives    Start     Dose/Rate Route Frequency Ordered Stop   04/17/15 2200  ceFAZolin (ANCEF) IVPB 2 g/50 mL premix     2 g 100 mL/hr over 30 Minutes Intravenous Every 6 hours 04/17/15 2047 04/18/15 0456   04/17/15 1415  ceFAZolin (ANCEF) IVPB 2 g/50 mL premix     2 g 100 mL/hr over 30 Minutes Intravenous On call to O.R. 04/17/15 1403 04/17/15 1637    .  He was given sequential compression devices, early ambulation, and ASA for DVT prophylaxis.  He benefited maximally from the hospital stay and there were no complications.    Recent vital signs:  Filed Vitals:   04/19/15 0500 04/19/15 0800  BP: 130/58   Pulse: 61 51  Temp: 97.6 F (36.4 C)   Resp: 16 16    Recent laboratory studies:  Lab Results  Component Value Date   HGB 10.4* 04/19/2015   HGB 11.8* 04/18/2015   HGB 12.3* 04/10/2015   Lab Results  Component Value Date  WBC 10.9* 04/19/2015   PLT 212 04/19/2015   Lab Results  Component Value Date   INR 1.14 04/17/2015   Lab Results  Component Value Date   NA 140 04/18/2015   K 4.7 04/18/2015   CL 106 04/18/2015   CO2 24 04/18/2015   BUN 24* 04/18/2015   CREATININE 1.22 04/18/2015   GLUCOSE 159* 04/18/2015    Discharge Medications:     Medication List    STOP taking these medications        acetaminophen 500 MG tablet  Commonly known as:  TYLENOL      TAKE these medications        amLODipine 10 MG tablet  Commonly known as:  NORVASC  Take 10 mg by mouth daily.     aspirin EC 325 MG tablet  Take 1 tablet (325 mg total) by mouth 2 (two) times daily after a meal.     COZAAR 50 MG  tablet  Generic drug:  losartan  Take 50 mg by mouth daily.     docusate sodium 100 MG capsule  Commonly known as:  COLACE  Take 1 capsule (100 mg total) by mouth 2 (two) times daily.     FLOMAX PO  Take 0.4 mg by mouth every morning.     lansoprazole 30 MG capsule  Commonly known as:  PREVACID  Take 30 mg by mouth every morning.     methocarbamol 500 MG tablet  Commonly known as:  ROBAXIN  Take 1 tablet (500 mg total) by mouth every 6 (six) hours as needed for muscle spasms.     ondansetron 4 MG tablet  Commonly known as:  ZOFRAN  Take 1 tablet (4 mg total) by mouth every 6 (six) hours as needed for nausea.     oxyCODONE 5 MG immediate release tablet  Commonly known as:  Oxy IR/ROXICODONE  Take 1-3 tablets (5-15 mg total) by mouth every 3 (three) hours as needed for moderate pain.     senna 8.6 MG Tabs tablet  Commonly known as:  SENOKOT  Take 2 tablets (17.2 mg total) by mouth at bedtime.     simvastatin 20 MG tablet  Commonly known as:  ZOCOR  Take 20 mg by mouth daily.        Diagnostic Studies: Dg Pelvis Portable  04/17/2015  CLINICAL DATA:  Status post left hip replacement today. Postoperative examination. EXAM: PORTABLE PELVIS 1-2 VIEWS COMPARISON:  Single view of the pelvis 11/05/2013. FINDINGS: Left total hip arthroplasty is in place. The device is located. There is no fracture. Gas the soft tissues from surgery noted. IMPRESSION: Status post left hip replacement without evidence of complication. Electronically Signed   By: Inge Rise M.D.   On: 04/17/2015 20:25   Dg C-arm 1-60 Min-no Report  04/17/2015  CLINICAL DATA: hip C-ARM 1-60 MINUTES Fluoroscopy was utilized by the requesting physician.  No radiographic interpretation.   Dg Hip Operative Unilat With Pelvis Left  04/17/2015  CLINICAL DATA:  Anterior left total hip arthroplasty for osteoarthritis. EXAM: OPERATIVE LEFT HIP (WITH PELVIS IF PERFORMED) 09/30/2014. VIEWS TECHNIQUE: Fluoroscopic spot image(s)  were submitted for interpretation post-operatively. COMPARISON:  09/30/2014. FINDINGS: Anatomic alignment in the AP projection post left total hip arthroplasty. No complicating features. Foley catheter in the urinary bladder. The radiologic technologist documented 6.18 mGy exposure and 0 min 31 sec of fluoroscopy time. IMPRESSION: Anatomic alignment in the AP projection post left total hip arthroplasty. Electronically Signed   By: Evangeline Dakin  M.D.   On: 04/17/2015 19:38    Disposition: 01-Home or Self Care  Discharge Instructions    Call MD / Call 911    Complete by:  As directed   If you experience chest pain or shortness of breath, CALL 911 and be transported to the hospital emergency room.  If you develope a fever above 101 F, pus (white drainage) or increased drainage or redness at the wound, or calf pain, call your surgeon's office.     Constipation Prevention    Complete by:  As directed   Drink plenty of fluids.  Prune juice may be helpful.  You may use a stool softener, such as Colace (over the counter) 100 mg twice a day.  Use MiraLax (over the counter) for constipation as needed.     Diet - low sodium heart healthy    Complete by:  As directed      Driving restrictions    Complete by:  As directed   No driving for 6 weeks     Increase activity slowly as tolerated    Complete by:  As directed      Lifting restrictions    Complete by:  As directed   No lifting for 6 weeks     TED hose    Complete by:  As directed   Use stockings (TED hose) for 2 weeks on both leg(s).  You may remove them at night for sleeping.           Follow-up Information    Follow up with Marguerite Barba, Horald Pollen, MD. Schedule an appointment as soon as possible for a visit in 2 weeks.   Specialty:  Orthopedic Surgery   Why:  For wound re-check   Contact information:   [address] [place] [address] [phone]       Follow up with [facility].   Why:  physical therapy    Contact information:   [address] [place] [address] [phone]       Follow up with [facility].   Why:  rolling walker and 3-n-1   Contact information:   [place] [address] [phone]        Signed: Elie Goody 04/19/2015, 9:02 AM

## 2015-04-17 NOTE — Discharge Instructions (Signed)
°Dr. Omarie Parcell °Joint Replacement Specialist °[facility] °[address] °[place], [place] [address] °[phone] ° ° °TOTAL HIP REPLACEMENT POSTOPERATIVE DIRECTIONS ° ° ° °Hip Rehabilitation, Guidelines Following Surgery  ° °WEIGHT BEARING °Weight bearing as tolerated with assist device (walker, cane, etc) as directed, use it as long as suggested by your surgeon or therapist, typically at least 4-6 weeks. ° °The results of a hip operation are greatly improved after range of motion and muscle strengthening exercises. Follow all safety measures which are given to protect your hip. If any of these exercises cause increased pain or swelling in your joint, decrease the amount until you are comfortable again. Then slowly increase the exercises. Call your caregiver if you have problems or questions.  ° °HOME CARE INSTRUCTIONS  °Most of the following instructions are designed to prevent the dislocation of your new hip.  °Remove items at home which could result in a fall. This includes throw rugs or furniture in walking pathways.  °Continue medications as instructed at time of discharge. °· You may have some home medications which will be placed on hold until you complete the course of blood thinner medication. °· You may start showering once you are discharged home. Do not remove your dressing. °Do not put on socks or shoes without following the instructions of your caregivers.   °Sit on chairs with arms. Use the chair arms to help push yourself up when arising.  °Arrange for the use of a toilet seat elevator so you are not sitting low.  °· Walk with walker as instructed.  °You may resume a sexual relationship in one month or when given the OK by your caregiver.  °Use walker as long as suggested by your caregivers.  °You may put full weight on your legs and walk as much as is comfortable. °Avoid periods of inactivity such as sitting longer than an hour when not asleep. This helps prevent  blood clots.  °You may return to work once you are cleared by your surgeon.  °Do not drive a car for 6 weeks or until released by your surgeon.  °Do not drive while taking narcotics.  °Wear elastic stockings for two weeks following surgery during the day but you may remove then at night.  °Make sure you keep all of your appointments after your operation with all of your doctors and caregivers. You should call the office at the above phone number and make an appointment for approximately two weeks after the date of your surgery. °Please pick up a stool softener and laxative for home use as long as you are requiring pain medications. °· ICE to the affected hip every three hours for 30 minutes at a time and then as needed for pain and swelling. Continue to use ice on the hip for pain and swelling from surgery. You may notice swelling that will progress down to the foot and ankle.  This is normal after surgery.  Elevate the leg when you are not up walking on it.   °It is important for you to complete the blood thinner medication as prescribed by your doctor. °· Continue to use the breathing machine which will help keep your temperature down.  It is common for your temperature to cycle up and down following surgery, especially at night when you are not up moving around and exerting yourself.  The breathing machine keeps your lungs expanded and your temperature down. ° °RANGE OF MOTION AND STRENGTHENING EXERCISES  °These exercises are   designed to help you keep full movement of your hip joint. Follow your caregiver's or physical therapist's instructions. Perform all exercises about fifteen times, three times per day or as directed. Exercise both hips, even if you have had only one joint replacement. These exercises can be done on a training (exercise) mat, on the floor, on a table or on a bed. Use whatever works the best and is most comfortable for you. Use music or television while you are exercising so that the exercises  are a pleasant break in your day. This will make your life better with the exercises acting as a break in routine you can look forward to.  °Lying on your back, slowly slide your foot toward your buttocks, raising your knee up off the floor. Then slowly slide your foot back down until your leg is straight again.  °Lying on your back spread your legs as far apart as you can without causing discomfort.  °Lying on your side, raise your upper leg and foot straight up from the floor as far as is comfortable. Slowly lower the leg and repeat.  °Lying on your back, tighten up the muscle in the front of your thigh (quadriceps muscles). You can do this by keeping your leg straight and trying to raise your heel off the floor. This helps strengthen the largest muscle supporting your knee.  °Lying on your back, tighten up the muscles of your buttocks both with the legs straight and with the knee bent at a comfortable angle while keeping your heel on the floor.  ° °SKILLED REHAB INSTRUCTIONS: °If the patient is transferred to a skilled rehab facility following release from the hospital, a list of the current medications will be sent to the facility for the patient to continue.  When discharged from the skilled rehab facility, please have the facility set up the patient's Home Health Physical Therapy prior to being released. Also, the skilled facility will be responsible for providing the patient with their medications at time of release from the facility to include their pain medication and their blood thinner medication. If the patient is still at the rehab facility at time of the two week follow up appointment, the skilled rehab facility will also need to assist the patient in arranging follow up appointment in our office and any transportation needs. ° °MAKE SURE YOU:  °Understand these instructions.  °Will watch your condition.  °Will get help right away if you are not doing well or get worse. ° °Pick up stool softner and  laxative for home use following surgery while on pain medications. °Do not remove your dressing. °The dressing is waterproof--it is OK to take showers. °Continue to use ice for pain and swelling after surgery. °Do not use any lotions or creams on the incision until instructed by your surgeon. °Total Hip Protocol. ° ° °

## 2015-04-17 NOTE — Anesthesia Preprocedure Evaluation (Addendum)
Anesthesia Evaluation  Patient identified by MRN, date of birth, ID band Patient awake    Reviewed: Allergy & Precautions, NPO status , Patient's Chart, lab work & pertinent test results  Airway Mallampati: I  TM Distance: >3 FB Neck ROM: Full    Dental  (+) Teeth Intact, Dental Advisory Given   Pulmonary former smoker,    breath sounds clear to auscultation       Cardiovascular hypertension, Pt. on medications  Rhythm:Regular Rate:Normal     Neuro/Psych    GI/Hepatic GERD  ,  Endo/Other  Morbid obesity  Renal/GU      Musculoskeletal   Abdominal   Peds  Hematology   Anesthesia Other Findings Hx of Bentall procedure in past with Bovine AVR.  Reproductive/Obstetrics                            Anesthesia Physical Anesthesia Plan  ASA: III  Anesthesia Plan: General   Post-op Pain Management:    Induction:   Airway Management Planned: Oral ETT  Additional Equipment:   Intra-op Plan:   Post-operative Plan: Extubation in OR  Informed Consent: I have reviewed the patients History and Physical, chart, labs and discussed the procedure including the risks, benefits and alternatives for the proposed anesthesia with the patient or authorized representative who has indicated his/her understanding and acceptance.   Dental advisory given  Plan Discussed with: Anesthesiologist, CRNA and Surgeon  Anesthesia Plan Comments:         Anesthesia Quick Evaluation

## 2015-04-17 NOTE — H&P (View-Only) (Signed)
TOTAL HIP ADMISSION H&P  Patient is admitted for left total hip arthroplasty.  Subjective:  Chief Complaint: left hip pain  HPI: Jeff Johnson, 70 y.o. male, has a history of pain and functional disability in the left hip(s) due to arthritis and patient has failed non-surgical conservative treatments for greater than 12 weeks to include NSAID's and/or analgesics, corticosteriod injections, flexibility and strengthening excercises, use of assistive devices, weight reduction as appropriate and activity modification.  Onset of symptoms was gradual starting 2 years ago with gradually worsening course since that time.The patient noted no past surgery on the left hip(s).  Patient currently rates pain in the left hip at 10 out of 10 with activity. Patient has night pain, worsening of pain with activity and weight bearing, pain that interfers with activities of daily living and pain with passive range of motion. Patient has evidence of subchondral sclerosis, periarticular osteophytes and joint space narrowing by imaging studies. This condition presents safety issues increasing the risk of falls. There is no current active infection.  Patient Active Problem List   Diagnosis Date Noted  . Benign essential HTN 05/21/2014  . Orthostatic hypotension   . Elevated BP   . BPH (benign prostatic hyperplasia)   . Diverticulitis   . Hyponatremia   . H/O cardiac catheterization   . Hematuria   . Severe aortic stenosis s/p pericardial AVR, Bentall in 2011   . Thoracic aortic aneurysm ([place])   . Contrast media allergy   . Obesity   . Prostate cancer ([place])   . Chronic cystitis   . 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 Dyspnea on exertion 03/18/2014  . Hyperlipidemia 03/18/2014  . GERD (gastroesophageal reflux disease) 03/18/2014  . SYNCOPE 02/18/2010  . ORTHOSTATIC DIZZINESS 02/18/2010   Past Medical History  Diagnosis Date  . Orthostatic hypotension   . Dizziness and giddiness     a. H/o  dizziness with reportedly negative tilt table. Holter 2012: NSR, sinus tachy, frequent PVCs, multifocal at times in a trigeminal fashion.  . Severe aortic stenosis     a. 11/2009: pericardial AVR/Bentall.  . Thoracic aortic aneurysm ([place])     a. 11/2009: s/p Magna Ease pericardial tissue valve size 5mm and replacement of fusiform ascending thoracic aneurysm with 30-mm Hemashield cath with hemiarch reconstruction.  . Prostate cancer ([place])     a. s/p radiation.  . Chronic cystitis     a. With ongoing hematuria.  . Hematuria   . Obesity   . H/O cardiac catheterization     a. 2011: minor luminal irregularities. b. Nuc 03/2014: ow risk without reversible ischemia or infarction, EF 57%. (There is septal akinesis but otherwise normal wall motion. This is nonspecific.)  . Elevated BP     a. 03/2014: started on amlodipine 2/2 periodical markedly elevated BPs.  . Hyponatremia     a. Remote hx hyponatremia felt 2/2 SSRI.  Marland Kitchen Diverticulitis     a. 2010: diverticulitis with stricture Sigmoid colectomy with mobilization of the splenic flexure within the end anastomosis.   Marland Kitchen BPH (benign prostatic hyperplasia)   . GERD (gastroesophageal reflux disease)   . Contrast media allergy   . Benign essential HTN 05/21/2014  . Hyperlipidemia 03/18/2014  . SYNCOPE 02/18/2010    Qualifier: Diagnosis of  By: Lovena Le, MD, Martyn Malay   . Dyspnea on exertion 03/18/2014  . 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 Tissue aortic valve replacement  12/11/09  . 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 Cholecystectomy  2010  . Colon surgery  2010    colon resection  . Doppler carotid  2012  . Doppler echocardiography  2011     (Not in a hospital admission)  ALLERGIES: shell fish   Social History  Substance Use Topics  . Smoking status: Former  Smoker    Types: Cigarettes    Quit date: 03/16/1967  . Smokeless tobacco: Not on file  . Alcohol Use: No    Family History  Problem Relation Age of Onset  . Heart disease Father 72  . Stroke Mother 27  . Heart attack Father      Review of Systems  Constitutional: Positive for chills and malaise/fatigue.  HENT: Positive for hearing loss and tinnitus. Negative for congestion, ear discharge, ear pain, nosebleeds and sore throat.   Respiratory: Positive for shortness of breath. Negative for stridor.   Cardiovascular: Negative.   Gastrointestinal: Negative.   Genitourinary: Positive for frequency and hematuria. Negative for dysuria, urgency and flank pain.  Skin: Negative.   Neurological: Negative.  Negative for headaches.  Endo/Heme/Allergies: Negative.   Psychiatric/Behavioral: Negative.     Objective:  Physical Exam  Vitals reviewed. Constitutional: He is oriented to person, place, and time. He appears well-developed and well-nourished.  HENT:  Head: Normocephalic and atraumatic.  Eyes: Conjunctivae and EOM are normal. Pupils are equal, round, and reactive to light.  Neck: Normal range of motion. Neck supple.  Cardiovascular: Normal rate, regular rhythm, normal heart sounds and intact distal pulses.   Respiratory: Effort normal and breath sounds normal. No respiratory distress.  GI: Soft. Bowel sounds are normal. He exhibits distension.  Genitourinary:  deferred  Musculoskeletal:       Left hip: He exhibits decreased range of motion and decreased strength.  Neurological: He is alert and oriented to person, place, and time. He has normal reflexes.  Skin: Skin is warm and dry.  Psychiatric: He has a normal mood and affect. His behavior is normal. Judgment and thought content normal.    Vital signs in last 24 hours: @VSRANGES @  Labs:   Estimated body mass index is 38.23 kg/(m^2) as calculated from the following:   Height as of 03/13/15: 5\' 9"  (1.753 m).   Weight as  of 03/13/15: 117.482 kg (259 lb).   Imaging Review Plain radiographs demonstrate severe degenerative joint disease of the left hip(s). The bone quality appears to be adequate for age and reported activity level.  Assessment/Plan:  End stage arthritis, left hip(s)  The patient history, physical examination, clinical judgement of the provider and imaging studies are consistent with end stage degenerative joint disease of the left hip(s) and total hip arthroplasty is deemed medically necessary. The treatment options including medical management, injection therapy, arthroscopy and arthroplasty were discussed at length. The risks and benefits of total hip arthroplasty were presented and reviewed. The risks due to aseptic loosening, infection, stiffness, dislocation/subluxation,  thromboembolic complications and other imponderables were discussed.  The patient acknowledged the explanation, agreed to proceed with the plan and consent was signed. Patient is being admitted for inpatient treatment for surgery, pain control, PT, OT, prophylactic antibiotics, VTE prophylaxis, progressive ambulation and ADL's and discharge planning.The patient is planning to be discharged home with home health services

## 2015-04-17 NOTE — Op Note (Signed)
OPERATIVE REPORT  SURGEON: Rod Can, MD   ASSISTANT: Nehemiah Massed, PA-C.  PREOPERATIVE DIAGNOSIS: Left hip arthritis.   POSTOPERATIVE DIAGNOSIS: Left hip arthritis.   PROCEDURE: Left total hip arthroplasty, anterior approach.   IMPLANTS: DePuy Tri Lock stem, size 8, hi offset. DePuy Pinnacle Cup, size 54 mm. DePuy Altrx liner, size 36 by 54 mm, neutral. DePuy Biolox ceramic head ball, size 36 + 1.5 mm.  ANESTHESIA:  General  ESTIMATED BLOOD LOSS: 600 mL.  ANTIBIOTICS: 2 g ancef.  DRAINS: None.  COMPLICATIONS: None.   CONDITION: PACU - hemodynamically stable.Marland Kitchen   BRIEF CLINICAL NOTE: Jeff Johnson is a 70 y.o. male with a long-standing history of Left hip arthritis. After failing conservative management, the patient was indicated for total hip arthroplasty. The risks, benefits, and alternatives to the procedure were explained, and the patient elected to proceed.  PROCEDURE IN DETAIL: Surgical site was marked by myself. Spinal anesthesia was obtained in the pre-op holding area. Once inside the operative room, a foley catheter was inserted. The patient was then positioned on the Hana table. All bony prominences were well padded. The hip was prepped and draped in the normal sterile surgical fashion. A time-out was called verifying side and site of surgery. The patient received IV antibiotics within 60 minutes of beginning the procedure.  The direct anterior approach to the hip was performed through the Hueter interval. Lateral femoral circumflex vessels were treated with the Auqumantys. The anterior capsule was exposed and an inverted T capsulotomy was made.The femoral neck cut was made to the level of the templated cut. A corkscrew was placed into the head and the head was removed. The femoral head was found to have eburnated bone. The head was passed to the back table and was measured.  Acetabular exposure was achieved, and the pulvinar and labrum were excised.  Sequental reaming of the acetabulum was then performed up to a size 53 mm reamer. A 53 mm cup was then opened and impacted into place at approximately 40 degrees of abduction and 20 degrees of anteversion. The final polyethylene liner was impacted into place and acetabular osteophytes were removed.   I then gained femoral exposure taking care to protect the abductors and greater trochanter. This was performed using standard external rotation, extension, and adduction. The capsule was peeled off the inner aspect of the greater trochanter, taking care to preserve the short external rotators. A cookie cutter was used to enter the femoral canal, and then the femoral canal finder was placed. Sequential broaching was performed up to a size 8. Calcar planer was used on the femoral neck remnant. I placed a hi offset neck and a trial head ball. The hip was reduced. Leg lengths and offset were checked fluoroscopically. The hip was dislocated and trial components were removed. The final implants were placed, and the hip was reduced.  Fluoroscopy was used to confirm component position and leg lengths. At 90 degrees of external rotation and full extension, the hip was stable to an anterior directed force.  The wound was copiously irrigated with a dilute betadine solution followed by normal saline. Marcaine solution was injected into the periarticular soft tissue. The wound was closed in layers using #1 Vicryl and V-Loc for the fascia, 2-0 Vicryl for the subcutaneous fat, 2-0 Monocryl for the deep dermal layer, 3-0 running Monocryl subcuticular stitch, and Dermabond for the skin. Once the glue was fully dried, an Aquacell Ag dressing was applied. The patient was transported to the recovery  room in stable condition. Sponge, needle, and instrument counts were correct at the end of the case x2. The patient tolerated the procedure well and there were no known complications.  Please note that a surgical  assistant was a medical necessity for this procedure to perform it in a safe and expeditious manner. Assistant was necessary to provide appropriate retraction of vital neurovascular structures, to prevent femoral fracture, and to allow for anatomic placement of the prosthesis.

## 2015-04-18 ENCOUNTER — Encounter (HOSPITAL_COMMUNITY): Payer: Self-pay | Admitting: Orthopedic Surgery

## 2015-04-18 LAB — CBC
HCT: 35.2 % — ABNORMAL LOW (ref 39.0–52.0)
Hemoglobin: 11.8 g/dL — ABNORMAL LOW (ref 13.0–17.0)
MCH: 29.9 pg (ref 26.0–34.0)
MCHC: 33.5 g/dL (ref 30.0–36.0)
MCV: 89.1 fL (ref 78.0–100.0)
Platelets: 213 10*3/uL (ref 150–400)
RBC: 3.95 MIL/uL — ABNORMAL LOW (ref 4.22–5.81)
RDW: 12.7 % (ref 11.5–15.5)
WBC: 10.1 10*3/uL (ref 4.0–10.5)

## 2015-04-18 LAB — BASIC METABOLIC PANEL
ANION GAP: 10 (ref 5–15)
BUN: 24 mg/dL — ABNORMAL HIGH (ref 6–20)
CALCIUM: 9 mg/dL (ref 8.9–10.3)
CHLORIDE: 106 mmol/L (ref 101–111)
CO2: 24 mmol/L (ref 22–32)
CREATININE: 1.22 mg/dL (ref 0.61–1.24)
GFR calc non Af Amer: 58 mL/min — ABNORMAL LOW (ref >60)
Glucose, Bld: 159 mg/dL — ABNORMAL HIGH (ref 65–99)
Potassium: 4.7 mmol/L (ref 3.5–5.1)
Sodium: 140 mmol/L (ref 135–145)

## 2015-04-18 MED ORDER — OXYCODONE HCL 5 MG PO TABS: 5.0000 mg | ORAL_TABLET | ORAL | Status: DC | PRN

## 2015-04-18 MED ORDER — HYDROCODONE-ACETAMINOPHEN 5-325 MG PO TABS: 1.0000 | ORAL_TABLET | ORAL | Status: DC | PRN

## 2015-04-18 MED ORDER — ASPIRIN EC 325 MG PO TBEC: 325.0000 mg | DELAYED_RELEASE_TABLET | Freq: Two times a day (BID) | ORAL | Status: DC

## 2015-04-18 MED ORDER — METHOCARBAMOL 500 MG PO TABS: 500.0000 mg | ORAL_TABLET | Freq: Four times a day (QID) | ORAL | Status: DC | PRN

## 2015-04-18 MED ORDER — DOCUSATE SODIUM 100 MG PO CAPS: 100.0000 mg | ORAL_CAPSULE | Freq: Two times a day (BID) | ORAL | Status: DC

## 2015-04-18 MED ORDER — OXYCODONE HCL 5 MG PO TABS
5.0000 mg | ORAL_TABLET | ORAL | Status: DC | PRN
  Administered 2015-04-18: 5 mg via ORAL
  Administered 2015-04-18: 10 mg via ORAL
  Administered 2015-04-18: 15 mg via ORAL
  Administered 2015-04-18: 5 mg via ORAL
  Administered 2015-04-19 (×4): 15 mg via ORAL
  Administered 2015-04-19: 10 mg via ORAL
  Administered 2015-04-19 – 2015-04-20 (×4): 15 mg via ORAL
  Filled 2015-04-18: qty 2
  Filled 2015-04-18: qty 3
  Filled 2015-04-18: qty 1
  Filled 2015-04-18 (×5): qty 3
  Filled 2015-04-18: qty 1
  Filled 2015-04-18 (×2): qty 3
  Filled 2015-04-18: qty 2
  Filled 2015-04-18: qty 3

## 2015-04-18 MED ORDER — ONDANSETRON HCL 4 MG PO TABS: 4.0000 mg | ORAL_TABLET | Freq: Four times a day (QID) | ORAL | Status: DC | PRN

## 2015-04-18 MED ORDER — SENNA 8.6 MG PO TABS: 2.0000 | ORAL_TABLET | Freq: Every day | ORAL | Status: DC

## 2015-04-18 NOTE — Care Management Note (Signed)
Case Management Note  Patient Details  Name: Jeff Johnson MRN: [id number] Date of Birth: Mar 02, 1946  Subjective/Objective:  S/p Left total hip arthroplasty, anterior approach                  Action/Plan: Discharge planning, spoke with patient at bedside. Have chosen [facility] for [facility]. Contacted [facility] for referral. Needs RW and 3-n-1, contacted [facility] to deliver to room. Patient in a lot of pain during my visit, has just finished working with PT. Patient states he is not sure how he will do at home, he has multiple stairs to traverse in his home, he will speak with his wife. He is not interested in SNF. Discussed with him that sometimes therapy initially is difficult but will get easier and it is important for him to move.   Expected Discharge Date:                  Expected Discharge Plan:  [place]  In-House Referral:  NA  Discharge planning Services  CM Consult  Post Acute Care Choice:  Durable Medical Equipment, Home Health Choice offered to:  Patient  DME Arranged:  3-N-1, Walker rolling DME Agency:  [place]:  PT [place]:  [place]  Status of Service:  In process, will continue to follow  Medicare Important Message Given:    Date Medicare IM Given:    Medicare IM give by:    Date Additional Medicare IM Given:    Additional Medicare Important Message give by:     If discussed at [place] of Stay Meetings, dates discussed:    Additional Comments:  Guadalupe Maple, RN 04/18/2015, 10:30 AM [phone]

## 2015-04-18 NOTE — Progress Notes (Signed)
Physical Therapy Treatment Patient Details Name: Jeff Johnson MRN: [id number] DOB: 1945/08/30 Today's Date: 04/18/2015    History of Present Illness L THR; hx of syncope, severe arotic stenosis, aortic valve replacement    PT Comments    Marked improvement in activity tolerance vs am 2* better pain control.  Follow Up Recommendations  Home health PT     Equipment Recommendations  Rolling walker with 5" wheels    Recommendations for Other Services OT consult     Precautions / Restrictions Precautions Precautions: Fall Restrictions Weight Bearing Restrictions: No Other Position/Activity Restrictions: WBAT    Mobility  Bed Mobility Overal bed mobility: Needs Assistance;+2 for physical assistance Bed Mobility: Supine to Sit     Supine to sit: Min assist;Mod assist;+2 for physical assistance;+2 for safety/equipment     General bed mobility comments: cues for sequence and use of R LE to self assist  Transfers Overall transfer level: Needs assistance Equipment used: Rolling walker (2 wheeled) Transfers: Sit to/from Stand Sit to Stand: +2 physical assistance;+2 safety/equipment;From elevated surface;Min assist;Mod assist         General transfer comment: cues for LE management and use of UEs to self assist  Ambulation/Gait Ambulation/Gait assistance: Min assist;+2 safety/equipment Ambulation Distance (Feet): 111 Feet Assistive device: Rolling walker (2 wheeled) Gait Pattern/deviations: Step-to pattern;Step-through pattern;Decreased step length - right;Decreased step length - left;Shuffle;Trunk flexed     General Gait Details: cues for posture, position from RW and initial sequence   Stairs            Wheelchair Mobility    Modified Rankin (Stroke Patients Only)       Balance                                    Cognition Arousal/Alertness: Awake/alert Behavior During Therapy: WFL for tasks assessed/performed Overall Cognitive  Status: Within Functional Limits for tasks assessed                      Exercises      General Comments        Pertinent Vitals/Pain Pain Assessment: 0-10 Pain Score: 2  (at rest) Pain Location: L hip/thigh Pain Descriptors / Indicators: Aching;Sore Pain Intervention(s): Limited activity within patient's tolerance;Monitored during session;Premedicated before session;Ice applied    Home Living                      Prior Function            PT Goals (current goals can now be found in the care plan section) Acute Rehab PT Goals Patient Stated Goal: Less pain so I can move and get home PT Goal Formulation: With patient Time For Goal Achievement: 04/25/15 Potential to Achieve Goals: Good Progress towards PT goals: Progressing toward goals    Frequency  7X/week    PT Plan Current plan remains appropriate    Co-evaluation             End of Session Equipment Utilized During Treatment: Gait belt Activity Tolerance: Patient tolerated treatment well Patient left: in chair;with call bell/phone within reach;with family/visitor present     Time: 1333-1405 PT Time Calculation (min) (ACUTE ONLY): 32 min  Charges:  $Gait Training: 23-37 mins                    G Codes:  Jeff Johnson 04/18/2015, 3:19 PM

## 2015-04-18 NOTE — Evaluation (Signed)
Physical Therapy Evaluation Patient Details Name: Jeff Johnson MRN: [id number] DOB: 15-Nov-1945 Today's Date: 04/18/2015   History of Present Illness  L THR; hx of syncope, severe arotic stenosis, aortic valve replacement  Clinical Impression  Pt s/p L THR presents with decreased L LE strength/ROM and post op pain limiting functional mobility.  Pt should progress to dc home with family assist and HHPT follow up.    Follow Up Recommendations Home health PT    Equipment Recommendations       Recommendations for Other Services OT consult     Precautions / Restrictions Precautions Precautions: Fall Restrictions Weight Bearing Restrictions: No Other Position/Activity Restrictions: WBAT      Mobility  Bed Mobility               General bed mobility comments: Pt manouvered half way to EOB with increased time, cues for sequence and assist to manage L LE.  DC session because of escalating pain level.  Transfers                    Ambulation/Gait                Stairs            Wheelchair Mobility    Modified Rankin (Stroke Patients Only)       Balance                                             Pertinent Vitals/Pain Pain Assessment: 0-10 Pain Score: 10-Worst pain ever Pain Location: L hip/thigh Pain Descriptors / Indicators: Aching;Throbbing;Spasm;Tightness Pain Intervention(s): Limited activity within patient's tolerance;Monitored during session;Premedicated before session;Patient requesting pain meds-RN notified;Ice applied    Home Living Family/patient expects to be discharged to:: Private residence Living Arrangements: Spouse/significant other Available Help at Discharge: Family Type of Home: House Home Access: Stairs to enter Entrance Stairs-Rails: Right Entrance Stairs-Number of Steps: 7+13+7 Home Layout: One level Home Equipment: [place] - single point      Prior Function Level of Independence:  Independent;Independent with assistive device(s)               Hand Dominance        Extremity/Trunk Assessment   Upper Extremity Assessment: Overall WFL for tasks assessed           Lower Extremity Assessment: LLE deficits/detail   LLE Deficits / Details: 2/5 strength at hip with AAROM at hip to 70 flex and 5 abd     Communication   Communication: No difficulties  Cognition Arousal/Alertness: Awake/alert Behavior During Therapy: WFL for tasks assessed/performed Overall Cognitive Status: Within Functional Limits for tasks assessed                      General Comments      Exercises Total Joint Exercises Ankle Circles/Pumps: AAROM;Both;15 reps;Supine Heel Slides: AAROM;Left;10 reps;Supine Hip ABduction/ADduction: AAROM;Left;10 reps;Supine      Assessment/Plan    PT Assessment Patient needs continued PT services  PT Diagnosis Difficulty walking   PT Problem List Decreased strength;Decreased range of motion;Decreased activity tolerance;Decreased mobility;Decreased knowledge of use of DME;Obesity;Pain  PT Treatment Interventions DME instruction;Gait training;Stair training;Functional mobility training;Therapeutic activities;Therapeutic exercise;Patient/family education   PT Goals (Current goals can be found in the Care Plan section) Acute Rehab PT Goals Patient Stated Goal: Less pain so I can move and  get home PT Goal Formulation: With patient Time For Goal Achievement: 04/25/15 Potential to Achieve Goals: Good    Frequency 7X/week   Barriers to discharge        Co-evaluation               End of Session   Activity Tolerance: Patient limited by pain Patient left: in bed;with call bell/phone within reach;with nursing/sitter in room Nurse Communication: Mobility status;Patient requests pain meds         Time: [id number] PT Time Calculation (min) (ACUTE ONLY): 26 min   Charges:   PT Evaluation $PT Eval Low Complexity: 1  Procedure PT Treatments $Therapeutic Exercise: 8-22 mins   PT G Codes:        Tachina Spoonemore 04/28/2015, 12:45 PM

## 2015-04-18 NOTE — Progress Notes (Signed)
   Subjective:  Patient reports pain as mild to moderate.  Pain control issues over night - better now. Denies N/V/CP/SOB.  Objective:   VITALS:   Filed Vitals:   04/17/15 2300 04/18/15 0035 04/18/15 0256 04/18/15 0633  BP: 105/70 126/84 123/90 132/74  Pulse: 83 83 71 66  Temp: 97.6 F (36.4 C) 97.8 F (36.6 C) 97.6 F (36.4 C) 97.7 F (36.5 C)  TempSrc: Oral Oral Oral Oral  Resp: 12 12 12 12   Height:      Weight:      SpO2: 99% 100% 97% 100%    ABD soft Sensation intact distally Intact pulses distally Dorsiflexion/Plantar flexion intact Incision: dressing C/D/I Compartment soft   Lab Results  Component Value Date   WBC 10.1 04/18/2015   HGB 11.8* 04/18/2015   HCT 35.2* 04/18/2015   MCV 89.1 04/18/2015   PLT 213 04/18/2015   BMET    Component Value Date/Time   NA 140 04/18/2015 0430   K 4.7 04/18/2015 0430   CL 106 04/18/2015 0430   CO2 24 04/18/2015 0430   GLUCOSE 159* 04/18/2015 0430   BUN 24* 04/18/2015 0430   CREATININE 1.22 04/18/2015 0430   CREATININE 1.17 02/17/2015 1223   CALCIUM 9.0 04/18/2015 0430   GFRNONAA 58* 04/18/2015 0430   GFRAA >60 04/18/2015 0430     Assessment/Plan: 1 Day Post-Op   Principal Problem:   Primary osteoarthritis of left hip   WBAT with walker DVT ppx: ASA, SCDs, TEDs PO pain control PT/OT Dispo: d/c home after clears therapy, likely tomorrow    Jeff Johnson, Jeff Johnson 04/18/2015, 7:20 AM   Rod Can, MD Cell [phone]

## 2015-04-18 NOTE — Progress Notes (Signed)
OT Cancellation Note  Patient Details Name: CHAWN PIECH MRN: [id number] DOB: 1946/03/06   Cancelled Treatment:    Reason Eval/Treat Not Completed: Other (comment).  Pt limited with PT this morning. Will return later today or tomorrow morning.  Kerrick Miler 04/18/2015, 11:05 AM  Lesle Chris, OTR/L [phone] 04/18/2015

## 2015-04-18 NOTE — Progress Notes (Signed)
Utilization review completed.  

## 2015-04-19 LAB — CBC
HCT: 31.5 % — ABNORMAL LOW (ref 39.0–52.0)
HEMOGLOBIN: 10.4 g/dL — AB (ref 13.0–17.0)
MCH: 29.7 pg (ref 26.0–34.0)
MCHC: 33 g/dL (ref 30.0–36.0)
MCV: 90 fL (ref 78.0–100.0)
Platelets: 212 10*3/uL (ref 150–400)
RBC: 3.5 MIL/uL — AB (ref 4.22–5.81)
RDW: 12.8 % (ref 11.5–15.5)
WBC: 10.9 10*3/uL — ABNORMAL HIGH (ref 4.0–10.5)

## 2015-04-19 NOTE — Progress Notes (Signed)
Physical Therapy Treatment Patient Details Name: Jeff Johnson MRN: [id number] DOB: [birth year] Today's Date: Apr 27, 2015    History of Present Illness L THR; hx of syncope, severe arotic stenosis, aortic valve replacement    PT Comments    Pt extremely fatigued this pm but agreeable to bed therex.  Will follow up in am.  Follow Up Recommendations  Home health PT     Equipment Recommendations  Rolling walker with 5" wheels    Recommendations for Other Services OT consult     Precautions / Restrictions Precautions Precautions: Fall Restrictions Weight Bearing Restrictions: No Other Position/Activity Restrictions: WBAT    Mobility  Bed Mobility               General bed mobility comments: NT - pt up in chair and declines back to bed  Transfers Overall transfer level: Needs assistance Equipment used: Rolling walker (2 wheeled) Transfers: Sit to/from [employer] Transfers Sit to Stand: Min assist         General transfer comment: cues for LE management and use of UEs to self assist  Ambulation/Gait                 Stairs            Wheelchair Mobility    Modified Rankin (Stroke Patients Only)       Balance                                    Cognition Arousal/Alertness: Awake/alert Behavior During Therapy: WFL for tasks assessed/performed Overall Cognitive Status: Within Functional Limits for tasks assessed                      Exercises Total Joint Exercises Ankle Circles/Pumps: AAROM;Both;15 reps;Supine Quad Sets: AROM;Both;10 reps;Supine Heel Slides: AAROM;Left;Supine;20 reps Hip ABduction/ADduction: AAROM;Left;Supine;15 reps    General Comments        Pertinent Vitals/Pain Pain Assessment: 0-10 Pain Score: 6  Pain Location: L hip/thigh Pain Descriptors / Indicators: Aching;Burning;Sore Pain Intervention(s): Limited activity within patient's tolerance;Monitored during session;Premedicated  before session;Ice applied    Home Living Family/patient expects to be discharged to:: Private residence Living Arrangements: Spouse/significant other Available Help at Discharge: Family Type of Home: House Home Access: Stairs to enter Entrance Stairs-Rails: Right Home Layout: One level Home Equipment: [place] - single point      Prior Function Level of Independence: Independent;Independent with assistive device(s)          PT Goals (current goals can now be found in the care plan section) Acute Rehab PT Goals Patient Stated Goal: Less pain so I can move and get home PT Goal Formulation: With patient Time For Goal Achievement: 04/25/15 Potential to Achieve Goals: Good Progress towards PT goals: Progressing toward goals    Frequency  7X/week    PT Plan Current plan remains appropriate    Co-evaluation             End of Session   Activity Tolerance: Patient limited by fatigue;Patient limited by pain Patient left: in bed;with call bell/phone within reach     Time: 1337-1414 PT Time Calculation (min) (ACUTE ONLY): 37 min  Charges:  $Therapeutic Exercise: 23-37 mins                    G Codes:      Jeff Johnson Apr 27, 2015, 3:52 PM

## 2015-04-19 NOTE — Progress Notes (Signed)
Physical Therapy Treatment Patient Details Name: Jeff Johnson MRN: [id number] DOB: 01-29-1946 Today's Date: May 01, 2015    History of Present Illness L THR; hx of syncope, severe arotic stenosis, aortic valve replacement    PT Comments    Steady progress with mobility.  Will consider initiating stair training this pm  Follow Up Recommendations  Home health PT     Equipment Recommendations  Rolling walker with 5" wheels    Recommendations for Other Services OT consult     Precautions / Restrictions Precautions Precautions: Fall Restrictions Weight Bearing Restrictions: No Other Position/Activity Restrictions: WBAT    Mobility  Bed Mobility               General bed mobility comments: NT - pt up in chair and declines back to bed  Transfers Overall transfer level: Needs assistance Equipment used: Rolling walker (2 wheeled) Transfers: Sit to/from Stand Sit to Stand: Min assist         General transfer comment: cues for LE management and use of UEs to self assist  Ambulation/Gait Ambulation/Gait assistance: Min assist Ambulation Distance (Feet): 200 Feet Assistive device: Rolling walker (2 wheeled) Gait Pattern/deviations: Step-to pattern;Step-through pattern;Decreased step length - left;Decreased step length - right;Shuffle;Trunk flexed Gait velocity: decreased Gait velocity interpretation: Below normal speed for age/gender General Gait Details: cues for posture, position from RW and initial sequence.  Multiple standing rests required to complete task   Stairs            Wheelchair Mobility    Modified Rankin (Stroke Patients Only)       Balance                                    Cognition Arousal/Alertness: Awake/alert Behavior During Therapy: WFL for tasks assessed/performed Overall Cognitive Status: Within Functional Limits for tasks assessed                      Exercises      General Comments         Pertinent Vitals/Pain Pain Assessment: 0-10 Pain Score: 4  Pain Location: L hip/thigh Pain Descriptors / Indicators: Aching;Sore;Burning Pain Intervention(s): Limited activity within patient's tolerance;Monitored during session;Premedicated before session;Ice applied    Home Living                      Prior Function            PT Goals (current goals can now be found in the care plan section) Acute Rehab PT Goals Patient Stated Goal: Less pain so I can move and get home PT Goal Formulation: With patient Time For Goal Achievement: 04/25/15 Potential to Achieve Goals: Good Progress towards PT goals: Progressing toward goals    Frequency  7X/week    PT Plan Current plan remains appropriate    Co-evaluation             End of Session Equipment Utilized During Treatment: Gait belt Activity Tolerance: Patient tolerated treatment well Patient left: in chair;with call bell/phone within reach     Time: 0900-0930 PT Time Calculation (min) (ACUTE ONLY): 30 min  Charges:  $Gait Training: 23-37 mins                    G Codes:      Aseem Sessums 01-May-2015, 10:29 AM

## 2015-04-19 NOTE — Progress Notes (Signed)
Patient ID: Jeff Johnson, male   DOB: May 24, 1945, 70 y.o.   MRN: [id number]    Subjective: 2 Days Post-Op Procedure(s) (LRB): LEFT TOTAL HIP ARTHROPLASTY ANTERIOR APPROACH (Left) Patient reports pain as 1 on 0-10 scale. (sitting) 7/10 walking Denies CP or SOB.  Voiding without difficulty. Positive flatus. Objective: Vital signs in last 24 hours: Temp:  [97.5 F (36.4 C)-98.7 F (37.1 C)] 97.6 F (36.4 C) (02/04 0500) Pulse Rate:  [51-73] 51 (02/04 0800) Resp:  [16-18] 16 (02/04 0800) BP: (119-137)/(58-68) 130/58 mmHg (02/04 0500) SpO2:  [96 %-100 %] 100 % (02/04 0800)  Intake/Output from previous day: 02/03 0701 - 02/04 0700 In: 2880 [P.O.:1680; I.V.:1200] Out: 1550 [Urine:1550] Intake/Output this shift: Total I/O In: -  Out: 125 [Urine:125]  Labs:  Recent Labs  04/18/15 0430 04/19/15 0500  HGB 11.8* 10.4*    Recent Labs  04/18/15 0430 04/19/15 0500  WBC 10.1 10.9*  RBC 3.95* 3.50*  HCT 35.2* 31.5*  PLT 213 212    Recent Labs  04/18/15 0430  NA 140  K 4.7  CL 106  CO2 24  BUN 24*  CREATININE 1.22  GLUCOSE 159*  CALCIUM 9.0    Recent Labs  04/17/15 1428  INR 1.14    Physical Exam: Neurologically intact ABD soft Dorsiflexion/Plantar flexion intact Incision: no drainage Compartment soft  Assessment/Plan: 2 Days Post-Op Procedure(s) (LRB): LEFT TOTAL HIP ARTHROPLASTY ANTERIOR APPROACH (Left) Advance diet Up with therapy  PT does not think the pt is ready to DC today.  They will be back to work with him again this afternoon  Aishani Kalis, Darla Lesches for Dr. Melina Schools [facility] [phone] 04/19/2015, 9:32 AM

## 2015-04-19 NOTE — Evaluation (Signed)
Occupational Therapy Evaluation Patient Details Name: Jeff Johnson MRN: [id number] DOB: 1945-12-03 Today's Date: 04/19/2015    History of Present Illness L THR; hx of syncope, severe arotic stenosis, aortic valve replacement   Clinical Impression   Pt is s/p THA resulting in the deficits listed below (see OT Problem List).  Pt will benefit from skilled OT to increase their safety and independence with ADL and functional mobility for ADL to facilitate discharge to venue listed below.        Follow Up Recommendations  Home health OT    Equipment Recommendations  3 in 1 bedside comode    Recommendations for Other Services       Precautions / Restrictions Precautions Precautions: Fall Restrictions Weight Bearing Restrictions: No Other Position/Activity Restrictions: WBAT      Mobility Bed Mobility               General bed mobility comments: NT - pt up in chair and declines back to bed  Transfers Overall transfer level: Needs assistance Equipment used: Rolling walker (2 wheeled) Transfers: Sit to/from [employer] Sit to Stand: Min assist         General transfer comment: cues for LE management and use of UEs to self assist    Balance                                            ADL Overall ADL's : Needs assistance/impaired                 Upper Body Dressing : Moderate assistance;Cueing for sequencing;Cueing for safety   Lower Body Dressing: Moderate assistance;Sit to/from stand;Cueing for sequencing;Cueing for safety   Toilet Transfer: Minimal assistance;RW;Comfort height toilet;Ambulation   Toileting- Clothing Manipulation and Hygiene: Moderate assistance;Sit to/from stand;Cueing for sequencing;Cueing for safety     Tub/Shower Transfer Details (indicate cue type and reason): pt has a tub. Verbally educated pt on technique to get inand out of tub.  Pt refused to simulate transfer for practice   General ADL  Comments: pt states significant other will A with ADL activity.  Pt with limited participation and seemed to have his own way of doing activity despite education from OT regarding strategies to make him more I.                 Pertinent Vitals/Pain Pain Assessment: 0-10 Pain Score: 6  Pain Location: L anterior thigh Pain Descriptors / Indicators: Sore Pain Intervention(s): Monitored during session;Repositioned;Patient requesting pain meds-RN notified     Hand Dominance        Communication Communication Communication: No difficulties   Cognition Arousal/Alertness: Awake/alert Behavior During Therapy: WFL for tasks assessed/performed Overall Cognitive Status: Within Functional Limits for tasks assessed                                Home Living Family/patient expects to be discharged to:: Private residence Living Arrangements: Spouse/significant other Available Help at Discharge: Family Type of Home: House Home Access: Stairs to enter Technical brewer of Steps: 7+13+7 Entrance Stairs-Rails: Right Home Layout: One level     Bathroom Shower/Tub: Teacher, early years/pre: Handicapped height     Home Equipment: [place] - single point          Prior Functioning/Environment Level of Independence:  Independent;Independent with assistive device(s)             OT Diagnosis: Acute pain;Generalized weakness   OT Problem List: Decreased strength      OT Goals(Current goals can be found in the care plan section) Acute Rehab OT Goals Patient Stated Goal: Less pain so I can move and get home  OT Frequency:                End of Session Equipment Utilized During Treatment: Rolling walker Nurse Communication: Mobility status  Activity Tolerance: Patient tolerated treatment well Patient left: in chair;with family/visitor present   Time: 1130-1145 OT Time Calculation (min): 15 min Charges:  OT General Charges $OT Visit: 1 Procedure OT  Evaluation $OT Eval Low Complexity: 1 Procedure G-Codes:    Payton Mccallum D 04/25/2015, 12:02 PM

## 2015-04-20 LAB — CBC
HEMATOCRIT: 31.3 % — AB (ref 39.0–52.0)
HEMOGLOBIN: 10.2 g/dL — AB (ref 13.0–17.0)
MCH: 29.9 pg (ref 26.0–34.0)
MCHC: 32.6 g/dL (ref 30.0–36.0)
MCV: 91.8 fL (ref 78.0–100.0)
Platelets: 194 10*3/uL (ref 150–400)
RBC: 3.41 MIL/uL — AB (ref 4.22–5.81)
RDW: 13.2 % (ref 11.5–15.5)
WBC: 9.4 10*3/uL (ref 4.0–10.5)

## 2015-04-20 NOTE — Progress Notes (Signed)
Physical Therapy Treatment Patient Details Name: Jeff Johnson MRN: [id number] DOB: 05-Aug-1945 Today's Date: 04/20/2015    History of Present Illness L THR; hx of syncope, severe arotic stenosis, aortic valve replacement    PT Comments    Pt ambulated 100' x 2 with RW with several brief standing rest breaks. Initiated stair training, will review stair training when pt's family arrives later today as he has 17 stairs to enter his home.  Follow Up Recommendations  Home health PT     Equipment Recommendations  Rolling walker with 5" wheels    Recommendations for Other Services OT consult     Precautions / Restrictions Precautions Precautions: Fall Restrictions Weight Bearing Restrictions: No Other Position/Activity Restrictions: WBAT    Mobility  Bed Mobility               General bed mobility comments: NT-pt up on commode  Transfers Overall transfer level: Needs assistance Equipment used: Rolling walker (2 wheeled) Transfers: Sit to/from Stand Sit to Stand: Supervision         General transfer comment: cues for LE management and use of UEs to self assist; uncontrolled descent to recliner. Pt reports dizziness initially upon standing, stated this is his baseline.  Ambulation/Gait Ambulation/Gait assistance: Min guard Ambulation Distance (Feet): 200 Feet Assistive device: Rolling walker (2 wheeled) Gait Pattern/deviations: Step-to pattern;Decreased step length - right;Decreased step length - left Gait velocity: decreased Gait velocity interpretation: Below normal speed for age/gender General Gait Details: cues for posture, position from RW and initial sequence.  Multiple standing rests required.    Stairs Stairs: Yes Stairs assistance: Min assist Stair Management: One rail Right;Forwards;Step to pattern;With crutches Number of Stairs: 4 General stair comments: verbal cues for sequencing with crutch, min/A for balance; will review stairs later today when  pt's family is here  Wheelchair Mobility    Modified Rankin (Stroke Patients Only)       Balance                                    Cognition Arousal/Alertness: Awake/alert Behavior During Therapy: WFL for tasks assessed/performed Overall Cognitive Status: Within Functional Limits for tasks assessed                      Exercises Total Joint Exercises Ankle Circles/Pumps: AAROM;Both;15 reps;Supine Quad Sets: AROM;Both;10 reps;Supine Short Arc Quad: AAROM;Left;10 reps;Supine Heel Slides: AAROM;Left;Supine;10 reps Hip ABduction/ADduction: AAROM;Left;Supine;10 reps    General Comments        Pertinent Vitals/Pain Pain Score: 5  Pain Location: L hip Pain Descriptors / Indicators: Sore Pain Intervention(s): Limited activity within patient's tolerance;Premedicated before session;Monitored during session;Ice applied    Home Living                      Prior Function            PT Goals (current goals can now be found in the care plan section) Acute Rehab PT Goals Patient Stated Goal: Less pain so I can move and get home PT Goal Formulation: With patient Time For Goal Achievement: 04/25/15 Potential to Achieve Goals: Good Progress towards PT goals: Progressing toward goals    Frequency  7X/week    PT Plan Current plan remains appropriate    Co-evaluation             End of Session Equipment Utilized During Treatment: Gait  belt Activity Tolerance: Patient limited by fatigue;Patient tolerated treatment well Patient left: with call bell/phone within reach;in chair     Time: [id number] PT Time Calculation (min) (ACUTE ONLY): 35 min  Charges:  $Gait Training: 8-22 mins $Therapeutic Exercise: 8-22 mins                    G Codes:      Philomena Doheny 04/20/2015, 9:59 AM [phone]

## 2015-04-20 NOTE — Progress Notes (Signed)
Discharge instruction reviewed with patient and wife Jeff Johnson)  Utilizing teach back method no questions at this time.

## 2015-04-20 NOTE — Care Management Important Message (Signed)
Important Message  Patient Details  Name: Jeff Johnson MRN: [id number] Date of Birth: 06-13-1945   Medicare Important Message Given:  Yes    Erenest Rasher, RN 04/20/2015, 10:09 AM

## 2015-04-20 NOTE — Progress Notes (Signed)
Patient discharged to home via private vehicle no questions at this time.

## 2015-04-20 NOTE — Progress Notes (Signed)
Physical Therapy Treatment Patient Details Name: Jeff Johnson MRN: [id number] DOB: 04-19-1945 Today's Date: 04/20/2015    History of Present Illness L THR; hx of syncope, severe arotic stenosis, aortic valve replacement    PT Comments    Stair training with pt's wife and grandson completed. REady to DC home.   Follow Up Recommendations  Home health PT     Equipment Recommendations  Rolling walker with 5" wheels;Crutches    Recommendations for Other Services OT consult     Precautions / Restrictions Precautions Precautions: Fall Restrictions Weight Bearing Restrictions: No Other Position/Activity Restrictions: WBAT    Mobility  Bed Mobility               General bed mobility comments: NT-pt in chair  Transfers Overall transfer level: Needs assistance Equipment used: Rolling walker (2 wheeled) Transfers: Sit to/from Stand Sit to Stand: Supervision         General transfer comment: . Pt reports dizziness initially upon standing, stated this is his baseline.  Ambulation/Gait Ambulation/Gait assistance: Min guard Ambulation Distance (Feet): 200 Feet Assistive device: Rolling walker (2 wheeled) Gait Pattern/deviations: Step-to pattern;Decreased step length - right;Decreased step length - left Gait velocity: decreased Gait velocity interpretation: Below normal speed for age/gender General Gait Details: cues for posture, position from RW and initial sequence.  Multiple standing rests required.    Stairs Stairs: Yes Stairs assistance: Min guard Stair Management: One rail Left Number of Stairs: 4 General stair comments: verbal cues for sequencing with crutch, min guard assist for balance/safety, pt's wife and grandson present and instructed in technique  Wheelchair Mobility    Modified Rankin (Stroke Patients Only)       Balance                                    Cognition Arousal/Alertness: Awake/alert Behavior During Therapy: WFL  for tasks assessed/performed Overall Cognitive Status: Within Functional Limits for tasks assessed                      Exercises Total Joint Exercises Ankle Circles/Pumps: AAROM;Both;15 reps;Supine Quad Sets: AROM;Both;10 reps;Supine Short Arc Quad: AAROM;Left;10 reps;Supine Heel Slides: AAROM;Left;Supine;10 reps Hip ABduction/ADduction: AAROM;Left;Supine;10 reps    General Comments        Pertinent Vitals/Pain Pain Score: 4  Pain Location: L hip Pain Descriptors / Indicators: Sore Pain Intervention(s): Monitored during session;Premedicated before session;Limited activity within patient's tolerance;Ice applied    Home Living                      Prior Function            PT Goals (current goals can now be found in the care plan section) Acute Rehab PT Goals Patient Stated Goal: Less pain so I can move and get home PT Goal Formulation: With patient Time For Goal Achievement: 04/25/15 Potential to Achieve Goals: Good Progress towards PT goals: Progressing toward goals    Frequency  7X/week    PT Plan Current plan remains appropriate    Co-evaluation             End of Session Equipment Utilized During Treatment: Gait belt Activity Tolerance: Patient limited by fatigue;Patient tolerated treatment well Patient left: with call bell/phone within reach;in chair;with family/visitor present     Time: 1208-1230 PT Time Calculation (min) (ACUTE ONLY): 22 min  Charges:  $Gait Training:  8-22 mins $Therapeutic Exercise: 8-22 mins $Self Care/Home Management: 8-22                    G Codes:      Philomena Doheny 04/20/2015, 12:36 PM [phone]

## 2015-04-20 NOTE — Progress Notes (Addendum)
Subjective: 3 Days Post-Op Procedure(s) (LRB): LEFT TOTAL HIP ARTHROPLASTY ANTERIOR APPROACH (Left)  Patient reports pain as mild to moderate.  Denies fever, chills, N/V.  Tolerating POs well.  Admits to BM.  Reports that he is ready to go home today.  Objective:   VITALS:  Temp:  [97.9 F (36.6 C)-98.8 F (37.1 C)] 98 F (36.7 C) (02/05 0422) Pulse Rate:  [55-64] 63 (02/05 0422) Resp:  [16-18] 16 (02/05 0422) BP: (111-132)/(51-71) 129/71 mmHg (02/05 0422) SpO2:  [95 %-99 %] 98 % (02/05 0422)  General: WDWN patient in NAD. Psych:  Appropriate mood and affect. Neuro:  A&O x 3, Moving all extremities, sensation intact to light touch HEENT:  EOMs intact Chest:  Even non-labored respirations Skin:  Dressing C/D/I, no rashes or lesions Extremities: warm/dry, mild edema, no erythema or echymosis.  No lymphadenopathy. Pulses: Popliteus 2+ MSK:  ROM: L hip 0-60 degrees, MMT: patient is able to perform quad set, (-) Homan's    LABS  Recent Labs  04/18/15 0430 04/19/15 0500 04/20/15 0508  HGB 11.8* 10.4* 10.2*  WBC 10.1 10.9* 9.4  PLT 213 212 194    Recent Labs  04/18/15 0430  NA 140  K 4.7  CL 106  CO2 24  BUN 24*  CREATININE 1.22  GLUCOSE 159*    Recent Labs  04/17/15 1428  INR 1.14     Assessment/Plan: 3 Days Post-Op Procedure(s) (LRB): LEFT TOTAL HIP ARTHROPLASTY ANTERIOR APPROACH (Left)  Up with therapy D/C IV fluids Discharge home with home health  WBAT LLE with wallker ASA for DVT prophylaxis RXs on chart Plan for outpatient post-op visit with Dr. Riley Lam, PA-C, [facility] Office:  [phone]

## 2015-04-21 DIAGNOSIS — I10 Essential (primary) hypertension: Secondary | ICD-10-CM | POA: Diagnosis not present

## 2015-04-21 DIAGNOSIS — Z96642 Presence of left artificial hip joint: Secondary | ICD-10-CM | POA: Diagnosis not present

## 2015-04-21 DIAGNOSIS — I951 Orthostatic hypotension: Secondary | ICD-10-CM | POA: Diagnosis not present

## 2015-04-21 DIAGNOSIS — E669 Obesity, unspecified: Secondary | ICD-10-CM | POA: Diagnosis not present

## 2015-04-21 DIAGNOSIS — Z8546 Personal history of malignant neoplasm of prostate: Secondary | ICD-10-CM | POA: Diagnosis not present

## 2015-04-21 DIAGNOSIS — Z471 Aftercare following joint replacement surgery: Secondary | ICD-10-CM | POA: Diagnosis not present

## 2015-04-23 DIAGNOSIS — I10 Essential (primary) hypertension: Secondary | ICD-10-CM | POA: Diagnosis not present

## 2015-04-23 DIAGNOSIS — I951 Orthostatic hypotension: Secondary | ICD-10-CM | POA: Diagnosis not present

## 2015-04-23 DIAGNOSIS — E669 Obesity, unspecified: Secondary | ICD-10-CM | POA: Diagnosis not present

## 2015-04-23 DIAGNOSIS — Z96642 Presence of left artificial hip joint: Secondary | ICD-10-CM | POA: Diagnosis not present

## 2015-04-23 DIAGNOSIS — Z8546 Personal history of malignant neoplasm of prostate: Secondary | ICD-10-CM | POA: Diagnosis not present

## 2015-04-23 DIAGNOSIS — Z471 Aftercare following joint replacement surgery: Secondary | ICD-10-CM | POA: Diagnosis not present

## 2015-04-25 DIAGNOSIS — I951 Orthostatic hypotension: Secondary | ICD-10-CM | POA: Diagnosis not present

## 2015-04-25 DIAGNOSIS — E669 Obesity, unspecified: Secondary | ICD-10-CM | POA: Diagnosis not present

## 2015-04-25 DIAGNOSIS — Z8546 Personal history of malignant neoplasm of prostate: Secondary | ICD-10-CM | POA: Diagnosis not present

## 2015-04-25 DIAGNOSIS — Z96642 Presence of left artificial hip joint: Secondary | ICD-10-CM | POA: Diagnosis not present

## 2015-04-25 DIAGNOSIS — I10 Essential (primary) hypertension: Secondary | ICD-10-CM | POA: Diagnosis not present

## 2015-04-25 DIAGNOSIS — Z471 Aftercare following joint replacement surgery: Secondary | ICD-10-CM | POA: Diagnosis not present

## 2015-04-28 DIAGNOSIS — Z471 Aftercare following joint replacement surgery: Secondary | ICD-10-CM | POA: Diagnosis not present

## 2015-04-28 DIAGNOSIS — I951 Orthostatic hypotension: Secondary | ICD-10-CM | POA: Diagnosis not present

## 2015-04-28 DIAGNOSIS — I10 Essential (primary) hypertension: Secondary | ICD-10-CM | POA: Diagnosis not present

## 2015-04-28 DIAGNOSIS — Z96642 Presence of left artificial hip joint: Secondary | ICD-10-CM | POA: Diagnosis not present

## 2015-04-28 DIAGNOSIS — E669 Obesity, unspecified: Secondary | ICD-10-CM | POA: Diagnosis not present

## 2015-04-28 DIAGNOSIS — Z8546 Personal history of malignant neoplasm of prostate: Secondary | ICD-10-CM | POA: Diagnosis not present

## 2015-04-30 DIAGNOSIS — I10 Essential (primary) hypertension: Secondary | ICD-10-CM | POA: Diagnosis not present

## 2015-04-30 DIAGNOSIS — Z96642 Presence of left artificial hip joint: Secondary | ICD-10-CM | POA: Diagnosis not present

## 2015-04-30 DIAGNOSIS — Z8546 Personal history of malignant neoplasm of prostate: Secondary | ICD-10-CM | POA: Diagnosis not present

## 2015-04-30 DIAGNOSIS — E669 Obesity, unspecified: Secondary | ICD-10-CM | POA: Diagnosis not present

## 2015-04-30 DIAGNOSIS — Z471 Aftercare following joint replacement surgery: Secondary | ICD-10-CM | POA: Diagnosis not present

## 2015-04-30 DIAGNOSIS — I951 Orthostatic hypotension: Secondary | ICD-10-CM | POA: Diagnosis not present

## 2015-05-02 DIAGNOSIS — Z471 Aftercare following joint replacement surgery: Secondary | ICD-10-CM | POA: Diagnosis not present

## 2015-05-02 DIAGNOSIS — Z96642 Presence of left artificial hip joint: Secondary | ICD-10-CM | POA: Diagnosis not present

## 2015-05-30 DIAGNOSIS — Z96642 Presence of left artificial hip joint: Secondary | ICD-10-CM | POA: Diagnosis not present

## 2015-05-30 DIAGNOSIS — Z471 Aftercare following joint replacement surgery: Secondary | ICD-10-CM | POA: Diagnosis not present

## 2015-07-09 DIAGNOSIS — Z4789 Encounter for other orthopedic aftercare: Secondary | ICD-10-CM | POA: Diagnosis not present

## 2015-07-09 DIAGNOSIS — M25562 Pain in left knee: Secondary | ICD-10-CM | POA: Diagnosis not present

## 2015-08-20 DIAGNOSIS — E782 Mixed hyperlipidemia: Secondary | ICD-10-CM | POA: Diagnosis not present

## 2015-08-20 DIAGNOSIS — Z0000 Encounter for general adult medical examination without abnormal findings: Secondary | ICD-10-CM | POA: Diagnosis not present

## 2015-08-20 DIAGNOSIS — Z1211 Encounter for screening for malignant neoplasm of colon: Secondary | ICD-10-CM | POA: Diagnosis not present

## 2015-08-20 DIAGNOSIS — I10 Essential (primary) hypertension: Secondary | ICD-10-CM | POA: Diagnosis not present

## 2015-10-07 DIAGNOSIS — Z8546 Personal history of malignant neoplasm of prostate: Secondary | ICD-10-CM | POA: Diagnosis not present

## 2015-10-17 DIAGNOSIS — C61 Malignant neoplasm of prostate: Secondary | ICD-10-CM | POA: Diagnosis not present

## 2015-10-17 DIAGNOSIS — R310 Gross hematuria: Secondary | ICD-10-CM | POA: Diagnosis not present

## 2015-10-30 DIAGNOSIS — Z23 Encounter for immunization: Secondary | ICD-10-CM | POA: Diagnosis not present

## 2015-11-13 DIAGNOSIS — M25562 Pain in left knee: Secondary | ICD-10-CM | POA: Diagnosis not present

## 2015-11-13 DIAGNOSIS — Z96642 Presence of left artificial hip joint: Secondary | ICD-10-CM | POA: Diagnosis not present

## 2015-11-13 DIAGNOSIS — Z471 Aftercare following joint replacement surgery: Secondary | ICD-10-CM | POA: Diagnosis not present

## 2015-11-27 DIAGNOSIS — I10 Essential (primary) hypertension: Secondary | ICD-10-CM | POA: Diagnosis not present

## 2015-11-27 DIAGNOSIS — Z136 Encounter for screening for cardiovascular disorders: Secondary | ICD-10-CM | POA: Diagnosis not present

## 2015-12-02 DIAGNOSIS — E782 Mixed hyperlipidemia: Secondary | ICD-10-CM | POA: Diagnosis not present

## 2015-12-02 DIAGNOSIS — I10 Essential (primary) hypertension: Secondary | ICD-10-CM | POA: Diagnosis not present

## 2015-12-02 DIAGNOSIS — C61 Malignant neoplasm of prostate: Secondary | ICD-10-CM | POA: Diagnosis not present

## 2015-12-03 ENCOUNTER — Other Ambulatory Visit: Payer: Self-pay | Admitting: Family Medicine

## 2015-12-03 DIAGNOSIS — R17 Unspecified jaundice: Secondary | ICD-10-CM

## 2015-12-16 ENCOUNTER — Ambulatory Visit
Admission: RE | Admit: 2015-12-16 | Discharge: 2015-12-16 | Disposition: A | Discharge status: Home / Self Care | Payer: Medicare Other | Source: Ambulatory Visit | Attending: Family Medicine | Admitting: Family Medicine

## 2015-12-16 DIAGNOSIS — R17 Unspecified jaundice: Secondary | ICD-10-CM

## 2016-01-07 DIAGNOSIS — I10 Essential (primary) hypertension: Secondary | ICD-10-CM | POA: Diagnosis not present

## 2016-01-13 DIAGNOSIS — I10 Essential (primary) hypertension: Secondary | ICD-10-CM | POA: Diagnosis not present

## 2016-01-13 DIAGNOSIS — Z6837 Body mass index (BMI) 37.0-37.9, adult: Secondary | ICD-10-CM | POA: Diagnosis not present

## 2016-01-13 DIAGNOSIS — M25552 Pain in left hip: Secondary | ICD-10-CM | POA: Diagnosis not present

## 2016-01-13 DIAGNOSIS — R945 Abnormal results of liver function studies: Secondary | ICD-10-CM | POA: Diagnosis not present

## 2016-01-20 DIAGNOSIS — M1712 Unilateral primary osteoarthritis, left knee: Secondary | ICD-10-CM | POA: Diagnosis not present

## 2016-01-27 DIAGNOSIS — M1712 Unilateral primary osteoarthritis, left knee: Secondary | ICD-10-CM | POA: Diagnosis not present

## 2016-02-03 DIAGNOSIS — M1712 Unilateral primary osteoarthritis, left knee: Secondary | ICD-10-CM | POA: Diagnosis not present

## 2016-02-10 DIAGNOSIS — M1712 Unilateral primary osteoarthritis, left knee: Secondary | ICD-10-CM | POA: Diagnosis not present

## 2016-02-12 DIAGNOSIS — M7062 Trochanteric bursitis, left hip: Secondary | ICD-10-CM | POA: Diagnosis not present

## 2016-02-12 DIAGNOSIS — M25562 Pain in left knee: Secondary | ICD-10-CM | POA: Diagnosis not present

## 2016-02-12 DIAGNOSIS — Z96642 Presence of left artificial hip joint: Secondary | ICD-10-CM | POA: Diagnosis not present

## 2016-02-12 DIAGNOSIS — Z471 Aftercare following joint replacement surgery: Secondary | ICD-10-CM | POA: Diagnosis not present

## 2016-02-17 DIAGNOSIS — M1712 Unilateral primary osteoarthritis, left knee: Secondary | ICD-10-CM | POA: Diagnosis not present

## 2016-02-20 DIAGNOSIS — M7062 Trochanteric bursitis, left hip: Secondary | ICD-10-CM | POA: Diagnosis not present

## 2016-02-24 DIAGNOSIS — M7062 Trochanteric bursitis, left hip: Secondary | ICD-10-CM | POA: Diagnosis not present

## 2016-02-26 DIAGNOSIS — M7062 Trochanteric bursitis, left hip: Secondary | ICD-10-CM | POA: Diagnosis not present

## 2016-03-02 DIAGNOSIS — M7062 Trochanteric bursitis, left hip: Secondary | ICD-10-CM | POA: Diagnosis not present

## 2016-03-05 DIAGNOSIS — M7062 Trochanteric bursitis, left hip: Secondary | ICD-10-CM | POA: Diagnosis not present

## 2016-03-09 DIAGNOSIS — J069 Acute upper respiratory infection, unspecified: Secondary | ICD-10-CM | POA: Diagnosis not present

## 2016-03-09 DIAGNOSIS — J329 Chronic sinusitis, unspecified: Secondary | ICD-10-CM | POA: Diagnosis not present

## 2016-03-09 DIAGNOSIS — H6122 Impacted cerumen, left ear: Secondary | ICD-10-CM | POA: Diagnosis not present

## 2016-03-09 DIAGNOSIS — H6121 Impacted cerumen, right ear: Secondary | ICD-10-CM | POA: Diagnosis not present

## 2016-03-12 DIAGNOSIS — M7062 Trochanteric bursitis, left hip: Secondary | ICD-10-CM | POA: Diagnosis not present

## 2016-03-16 DIAGNOSIS — M1712 Unilateral primary osteoarthritis, left knee: Secondary | ICD-10-CM | POA: Diagnosis not present

## 2016-03-18 DIAGNOSIS — M7062 Trochanteric bursitis, left hip: Secondary | ICD-10-CM | POA: Diagnosis not present

## 2016-03-23 DIAGNOSIS — J069 Acute upper respiratory infection, unspecified: Secondary | ICD-10-CM | POA: Diagnosis not present

## 2016-03-23 DIAGNOSIS — M1712 Unilateral primary osteoarthritis, left knee: Secondary | ICD-10-CM | POA: Diagnosis not present

## 2016-03-23 DIAGNOSIS — Z6837 Body mass index (BMI) 37.0-37.9, adult: Secondary | ICD-10-CM | POA: Diagnosis not present

## 2016-03-25 DIAGNOSIS — M7062 Trochanteric bursitis, left hip: Secondary | ICD-10-CM | POA: Diagnosis not present

## 2016-03-30 DIAGNOSIS — M1712 Unilateral primary osteoarthritis, left knee: Secondary | ICD-10-CM | POA: Diagnosis not present

## 2016-04-06 DIAGNOSIS — M1712 Unilateral primary osteoarthritis, left knee: Secondary | ICD-10-CM | POA: Diagnosis not present

## 2016-04-08 DIAGNOSIS — M7632 Iliotibial band syndrome, left leg: Secondary | ICD-10-CM | POA: Diagnosis not present

## 2016-04-08 DIAGNOSIS — M76892 Other specified enthesopathies of left lower limb, excluding foot: Secondary | ICD-10-CM | POA: Diagnosis not present

## 2016-04-13 DIAGNOSIS — M1712 Unilateral primary osteoarthritis, left knee: Secondary | ICD-10-CM | POA: Diagnosis not present

## 2016-04-19 DIAGNOSIS — M7062 Trochanteric bursitis, left hip: Secondary | ICD-10-CM | POA: Diagnosis not present

## 2016-04-21 DIAGNOSIS — M7062 Trochanteric bursitis, left hip: Secondary | ICD-10-CM | POA: Diagnosis not present

## 2016-04-22 ENCOUNTER — Other Ambulatory Visit: Payer: Self-pay | Admitting: Orthopedic Surgery

## 2016-04-22 DIAGNOSIS — I10 Essential (primary) hypertension: Secondary | ICD-10-CM | POA: Diagnosis not present

## 2016-04-22 DIAGNOSIS — R21 Rash and other nonspecific skin eruption: Secondary | ICD-10-CM | POA: Diagnosis not present

## 2016-04-22 DIAGNOSIS — Z6838 Body mass index (BMI) 38.0-38.9, adult: Secondary | ICD-10-CM | POA: Diagnosis not present

## 2016-04-22 DIAGNOSIS — B353 Tinea pedis: Secondary | ICD-10-CM | POA: Diagnosis not present

## 2016-04-22 DIAGNOSIS — M76892 Other specified enthesopathies of left lower limb, excluding foot: Secondary | ICD-10-CM

## 2016-04-26 DIAGNOSIS — M7062 Trochanteric bursitis, left hip: Secondary | ICD-10-CM | POA: Diagnosis not present

## 2016-04-28 DIAGNOSIS — M7062 Trochanteric bursitis, left hip: Secondary | ICD-10-CM | POA: Diagnosis not present

## 2016-05-03 DIAGNOSIS — C61 Malignant neoplasm of prostate: Secondary | ICD-10-CM | POA: Diagnosis not present

## 2016-05-03 DIAGNOSIS — Z6837 Body mass index (BMI) 37.0-37.9, adult: Secondary | ICD-10-CM | POA: Diagnosis not present

## 2016-05-03 DIAGNOSIS — R351 Nocturia: Secondary | ICD-10-CM | POA: Diagnosis not present

## 2016-05-03 DIAGNOSIS — I10 Essential (primary) hypertension: Secondary | ICD-10-CM | POA: Diagnosis not present

## 2016-05-04 DIAGNOSIS — M7062 Trochanteric bursitis, left hip: Secondary | ICD-10-CM | POA: Diagnosis not present

## 2016-05-07 ENCOUNTER — Ambulatory Visit
Admission: RE | Admit: 2016-05-07 | Discharge: 2016-05-07 | Disposition: A | Discharge status: Home / Self Care | Payer: Medicare Other | Source: Ambulatory Visit | Attending: Orthopedic Surgery | Admitting: Orthopedic Surgery

## 2016-05-07 DIAGNOSIS — M76892 Other specified enthesopathies of left lower limb, excluding foot: Secondary | ICD-10-CM

## 2016-05-07 MED ORDER — METHYLPREDNISOLONE ACETATE 40 MG/ML INJ SUSP (RADIOLOG
120.0000 mg | Freq: Once | INTRAMUSCULAR | Status: AC
  Administered 2016-05-07: 120 mg via INTRAMUSCULAR

## 2016-05-10 DIAGNOSIS — M7062 Trochanteric bursitis, left hip: Secondary | ICD-10-CM | POA: Diagnosis not present

## 2016-05-31 DIAGNOSIS — R600 Localized edema: Secondary | ICD-10-CM | POA: Diagnosis not present

## 2016-05-31 DIAGNOSIS — R351 Nocturia: Secondary | ICD-10-CM | POA: Diagnosis not present

## 2016-05-31 DIAGNOSIS — M1712 Unilateral primary osteoarthritis, left knee: Secondary | ICD-10-CM | POA: Diagnosis not present

## 2016-05-31 DIAGNOSIS — I10 Essential (primary) hypertension: Secondary | ICD-10-CM | POA: Diagnosis not present

## 2016-06-03 DIAGNOSIS — M1712 Unilateral primary osteoarthritis, left knee: Secondary | ICD-10-CM | POA: Diagnosis not present

## 2016-06-03 DIAGNOSIS — M76892 Other specified enthesopathies of left lower limb, excluding foot: Secondary | ICD-10-CM | POA: Diagnosis not present

## 2016-06-03 DIAGNOSIS — M7062 Trochanteric bursitis, left hip: Secondary | ICD-10-CM | POA: Diagnosis not present

## 2016-06-15 DIAGNOSIS — Z6836 Body mass index (BMI) 36.0-36.9, adult: Secondary | ICD-10-CM | POA: Diagnosis not present

## 2016-06-15 DIAGNOSIS — J069 Acute upper respiratory infection, unspecified: Secondary | ICD-10-CM | POA: Diagnosis not present

## 2016-06-28 DIAGNOSIS — M25562 Pain in left knee: Secondary | ICD-10-CM | POA: Diagnosis not present

## 2016-06-28 DIAGNOSIS — I10 Essential (primary) hypertension: Secondary | ICD-10-CM | POA: Diagnosis not present

## 2016-06-28 DIAGNOSIS — M169 Osteoarthritis of hip, unspecified: Secondary | ICD-10-CM | POA: Diagnosis not present

## 2016-06-28 DIAGNOSIS — Z6838 Body mass index (BMI) 38.0-38.9, adult: Secondary | ICD-10-CM | POA: Diagnosis not present

## 2016-07-01 DIAGNOSIS — M25562 Pain in left knee: Secondary | ICD-10-CM | POA: Diagnosis not present

## 2016-07-01 DIAGNOSIS — M1712 Unilateral primary osteoarthritis, left knee: Secondary | ICD-10-CM | POA: Diagnosis not present

## 2016-07-01 DIAGNOSIS — G8929 Other chronic pain: Secondary | ICD-10-CM | POA: Diagnosis not present

## 2016-07-12 DIAGNOSIS — G8929 Other chronic pain: Secondary | ICD-10-CM | POA: Diagnosis not present

## 2016-07-12 DIAGNOSIS — M25562 Pain in left knee: Secondary | ICD-10-CM | POA: Diagnosis not present

## 2016-07-14 DIAGNOSIS — S8992XA Unspecified injury of left lower leg, initial encounter: Secondary | ICD-10-CM | POA: Diagnosis not present

## 2016-07-14 DIAGNOSIS — I509 Heart failure, unspecified: Secondary | ICD-10-CM | POA: Diagnosis not present

## 2016-07-14 DIAGNOSIS — I10 Essential (primary) hypertension: Secondary | ICD-10-CM | POA: Diagnosis not present

## 2016-07-19 DIAGNOSIS — M25562 Pain in left knee: Secondary | ICD-10-CM | POA: Diagnosis not present

## 2016-07-19 DIAGNOSIS — G8929 Other chronic pain: Secondary | ICD-10-CM | POA: Diagnosis not present

## 2016-07-19 DIAGNOSIS — M1712 Unilateral primary osteoarthritis, left knee: Secondary | ICD-10-CM | POA: Diagnosis not present

## 2016-08-04 ENCOUNTER — Other Ambulatory Visit: Payer: Self-pay | Admitting: Cardiothoracic Surgery

## 2016-08-04 DIAGNOSIS — I712 Thoracic aortic aneurysm, without rupture, unspecified: Secondary | ICD-10-CM

## 2016-08-24 ENCOUNTER — Other Ambulatory Visit: Payer: Self-pay | Admitting: Family Medicine

## 2016-08-24 ENCOUNTER — Ambulatory Visit
Admission: RE | Admit: 2016-08-24 | Discharge: 2016-08-24 | Disposition: A | Discharge status: Home / Self Care | Payer: Medicare Other | Source: Ambulatory Visit | Attending: Family Medicine | Admitting: Family Medicine

## 2016-08-24 DIAGNOSIS — R0602 Shortness of breath: Secondary | ICD-10-CM

## 2016-08-24 DIAGNOSIS — Z6838 Body mass index (BMI) 38.0-38.9, adult: Secondary | ICD-10-CM | POA: Diagnosis not present

## 2016-08-24 DIAGNOSIS — Z1211 Encounter for screening for malignant neoplasm of colon: Secondary | ICD-10-CM | POA: Diagnosis not present

## 2016-08-24 DIAGNOSIS — I10 Essential (primary) hypertension: Secondary | ICD-10-CM | POA: Diagnosis not present

## 2016-08-24 DIAGNOSIS — I509 Heart failure, unspecified: Secondary | ICD-10-CM | POA: Diagnosis not present

## 2016-08-24 DIAGNOSIS — Z0000 Encounter for general adult medical examination without abnormal findings: Secondary | ICD-10-CM | POA: Diagnosis not present

## 2016-08-30 ENCOUNTER — Encounter: Payer: Self-pay | Admitting: Cardiovascular Disease

## 2016-08-30 ENCOUNTER — Ambulatory Visit (INDEPENDENT_AMBULATORY_CARE_PROVIDER_SITE_OTHER): Payer: Medicare Other | Admitting: Cardiovascular Disease

## 2016-08-30 VITALS — BP 128/76 | HR 66 | Ht 72.0 in | Wt 264.8 lb

## 2016-08-30 DIAGNOSIS — Z954 Presence of other heart-valve replacement: Secondary | ICD-10-CM | POA: Diagnosis not present

## 2016-08-30 DIAGNOSIS — I10 Essential (primary) hypertension: Secondary | ICD-10-CM | POA: Diagnosis not present

## 2016-08-30 DIAGNOSIS — M1712 Unilateral primary osteoarthritis, left knee: Secondary | ICD-10-CM | POA: Diagnosis not present

## 2016-08-30 DIAGNOSIS — Z6838 Body mass index (BMI) 38.0-38.9, adult: Secondary | ICD-10-CM | POA: Diagnosis not present

## 2016-08-30 DIAGNOSIS — R0600 Dyspnea, unspecified: Secondary | ICD-10-CM | POA: Diagnosis not present

## 2016-08-30 NOTE — Progress Notes (Signed)
Cardiology Office Note   Date:  08/30/2016   ID:  Jeff Johnson, Jeff Johnson 02/13/1946, MRN [id number]  PCP:  [name], MD  Cardiologist:   [name], MD   Chief Complaint  Patient presents with  . Follow-up    aortic stenosis, HTN,    1. Aortic stenosis/ aortic insufficiency-status post Bentall procedure- 2011 2. History of chronic cystitis 3. Hyperlipidemia 4. Dizziness - ? orthostasis. Negative tilt table study. 30 day monitor negative. Occurs with standing or sitting, did not improve wit Midodrine  History of Present Illness:  71 yo with hx of aortic valve disease - s/p Bentall procedure.   He's been having some problems with dizziness. He has seen Dr. Crissie Sickles. He's had a 30 day event monitor which was negative. He had a tilt table study which was negative. He has had episodes of dizziness while standing and with sitting. He usually has several seconds of warning. If he is driving his car he was able to pull over to the side of the road. He has tried Midodrine but does not tolerate it very well. It causes him to be anxious and also causes urinary retention. He also has been eating lots of salt and that helped some.  He complains of generalized fatigue. He does not sleep very well. He has had prostate cancer and has to go to the bathroom at least 3 times a night.  Nov. 6, 2014:   Jeff Johnson is doing well. He is working part time which involves standing and walking for 8 hours. ( [employer]) No CP, no dyspnea.   May 21, 2014:  Jeff Johnson is a 71 y.o. male who presents for follow up of his Bentall procedure in 2011.   Has recorded his BP.   Readings are mostly ok.  No CP , no dyspnea  Dec. 5, 2016:  Doing ok from a cardiac standpoint.  Having some hip pain . Needs to have cardiac clearance.  Following up with Dr. Prescott Gum for an aortic aneurism.   August 30, 2016:  Jeff Johnson is seen back for follow up  Was last seen 1.5 years ago Has been  having lots of leg ( hip and knee problems )  He is s/p Bentall procedure in 2011.   Has some dizziness when he stands up  Has shortness of breath when he bends over.  Has had some leg swelling and Dr. Ernie Hew started HCTZ 25 mg a day   Past Medical History:  Diagnosis Date  . Arthritis   . Benign essential HTN 05/21/2014  . BPH (benign prostatic hyperplasia)   . Chronic cystitis    a. With ongoing hematuria.  . Contrast media allergy   . Diverticulitis    a. 2010: diverticulitis with stricture Sigmoid colectomy with mobilization of the splenic flexure within the end anastomosis.   . Dizziness and giddiness    a. H/o dizziness with reportedly negative tilt table. Holter 2012: NSR, sinus tachy, frequent PVCs, multifocal at times in a trigeminal fashion.  Marland Kitchen Dyspnea on exertion 03/18/2014  . Frequency of urination   . GERD (gastroesophageal reflux disease)   . H/O cardiac catheterization    a. 2011: minor luminal irregularities. b. Nuc 03/2014: ow risk without reversible ischemia or infarction, EF 57%. (There is septal akinesis but otherwise normal wall motion. This is nonspecific.)  . Headache    MIGRAINES  . Hematuria   . History of urinary retention   . Hyperlipidemia 03/18/2014  .  Hyponatremia    a. Remote hx hyponatremia felt 2/2 SSRI.  Marland Kitchen Nocturia   . Obesity   . Orthostatic hypotension   . Prostate cancer ([place])    a. s/p radiation.  . Rash    LEFT KNEE   . 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 Severe aortic stenosis    a. 11/2009: pericardial AVR/Bentall.  . SYNCOPE 02/18/2010   Qualifier: Diagnosis of  By: Lovena Le, MD, Martyn Malay   . Thoracic aortic aneurysm [facility])    a. 11/2009: s/p Magna Ease pericardial tissue valve size 77mm and replacement of fusiform ascending thoracic aneurysm with 30-mm Hemashield cath with hemiarch reconstruction.    Past Surgical History:  Procedure Laterality Date  . BACK SURGERY  1985  . CARPAL TUNNEL RELEASE     BIL WRISTS  .  CHOLECYSTECTOMY  2010  . COLON SURGERY  2010   colon resection  . doppler carotid  2012  . DOPPLER ECHOCARDIOGRAPHY  2011  . PROSTATE SURGERY  2011   adenocarcinoma w/raddiation   . ROTATOR CUFF REPAIR     X2 LEFT SHOULDER  . THORACIC AORTIC ANEURYSM REPAIR  12/11/09   ASCENDING THORACIC AORTIC ANEURYSM REPAIR  . TISSUE AORTIC VALVE REPLACEMENT  12/11/09  . TOTAL HIP ARTHROPLASTY Left 04/17/2015   Procedure: LEFT TOTAL HIP ARTHROPLASTY ANTERIOR APPROACH;  Surgeon: Rod Can, MD;  Location: [facility];  Service: Orthopedics;  Laterality: Left;     Current Outpatient Prescriptions  Medication Sig Dispense Refill  . acetaminophen (TYLENOL) 500 MG tablet Take 500 mg by mouth every 6 (six) hours as needed for mild pain, moderate pain, fever or headache.    Marland Kitchen aspirin 81 MG chewable tablet Chew 81 mg by mouth daily.    Marland Kitchen azelastine (ASTELIN) 0.1 % nasal spray Place 1 spray into both nostrils 2 (two) times daily.  11  . benzonatate (TESSALON) 200 MG capsule Take 200 mg by mouth 3 (three) times daily as needed for cough.    . hydrochlorothiazide (HYDRODIURIL) 12.5 MG tablet Take 1 tablet by mouth 2 (two) times daily.  3  . lansoprazole (PREVACID) 30 MG capsule Take 30 mg by mouth every morning.    Marland Kitchen losartan (COZAAR) 100 MG tablet Take 100 mg by mouth daily.  4  . montelukast (SINGULAIR) 10 MG tablet Take 10 mg by mouth daily.  3  . simvastatin (ZOCOR) 20 MG tablet Take 20 mg by mouth daily.     . Tamsulosin HCl (FLOMAX PO) Take 0.4 mg by mouth every morning.      No current facility-administered medications for this visit.     Allergies:   Beta adrenergic blockers; Iodinated diagnostic agents; and Shellfish allergy    Social History:  The patient  reports that he quit smoking about 49 years ago. His smoking use included Cigarettes. He has never used smokeless tobacco. He reports that he does not drink alcohol or use drugs.   Family History:  The patient's family history includes Heart  attack in his father; Heart disease (age of onset: 55) in his father; Stroke (age of onset: 55) in his mother.    ROS:  Please see the history of present illness.    Review of Systems: Constitutional:  denies fever, chills, diaphoresis, appetite change and fatigue.  HEENT: denies photophobia, eye pain, redness, hearing loss, ear pain, congestion, sore throat, rhinorrhea, sneezing, neck pain, neck stiffness and tinnitus.  Respiratory: denies SOB, DOE, cough, chest tightness, and wheezing.  Cardiovascular: denies  chest pain, palpitations and leg swelling.  Gastrointestinal: denies nausea, vomiting, abdominal pain, diarrhea, constipation, blood in stool.  Genitourinary: denies dysuria, urgency, frequency, hematuria, flank pain and difficulty urinating.  Musculoskeletal: denies  myalgias, back pain, joint swelling, arthralgias and gait problem.   Skin: denies pallor, rash and wound.  Neurological: denies dizziness, seizures, syncope, weakness, light-headedness, numbness and headaches.   Hematological: denies adenopathy, easy bruising, personal or family bleeding history.  Psychiatric/ Behavioral: denies suicidal ideation, mood changes, confusion, nervousness, sleep disturbance and agitation.       All other systems are reviewed and negative.    PHYSICAL EXAM: VS:  BP 128/76   Pulse 66   Ht 6' (1.829 m)   Wt 264 lb 12.8 oz (120.1 kg)   BMI 35.91 kg/m  , BMI Body mass index is 35.91 kg/m. GEN: Well nourished, well developed, in no acute distress  HEENT: normal  Neck: no JVD, carotid bruits, or masses Cardiac: RRR; no murmurs, rubs, or gallops,no edema  Respiratory:  clear to auscultation bilaterally, normal work of breathing GI: soft, nontender, nondistended, + BS MS: no deformity or atrophy  Skin: warm and dry, no rash Neuro:  Strength and sensation are intact Psych: normal   EKG:  EKG is ordered today.    NSR at 66.  Previous INf. MI    Recent Labs: No results found for  requested labs within last 8760 hours.    Lipid Panel    Component Value Date/Time   CHOL 129 02/17/2015 1223   TRIG 163 (H) 02/17/2015 1223   HDL 31 (L) 02/17/2015 1223   CHOLHDL 4.2 02/17/2015 1223   VLDL 33 (H) 02/17/2015 1223   LDLCALC 65 02/17/2015 1223      Wt Readings from Last 3 Encounters:  08/30/16 264 lb 12.8 oz (120.1 kg)  04/17/15 264 lb (119.7 kg)  04/10/15 264 lb (119.7 kg)      Other studies Reviewed: Additional studies/ records that were reviewed today include: . Review of the above records demonstrates:    ASSESSMENT AND PLAN:  1. Aortic stenosis/ aortic insufficiency-status post Bentall procedure- 2011, followed by Dr. Prescott Gum.   seems to be stable from a cardiac standpoint   2. History of chronic cystitis 3. Hyperlipidemia -  Lipids are ok.  Encouraged a better diet and weight loss plan   4. Dizziness -  5.   Essential Hypertension:  BP is ok.  Encouraged him to lose weight .   6. Dyspnea:   Suspect most of this shortness breath is due to his obesity and excessive deconditioning. He has had it with the problems and has not been able to exercise quite some time. His blood pressure and heart rate are well controlled. His last echocardiogram reveals normal left ventricle systolic function. I do not have any new cardiac recommendations at this time. I'll see him again in one year.  Current medicines are reviewed at length with the patient today.  The patient does not have concerns regarding medicines.  The following changes have been made:  no change   Disposition:   FU with me in 1 year.     Signed, [name], MD  08/30/2016 1:41 PM    [facility] [place], [place], [place]  [address] Phone: [phone]; Fax: [phone]

## 2016-08-30 NOTE — Patient Instructions (Signed)
Medication Instructions:  Your physician recommends that you continue on your current medications as directed. Please refer to the Current Medication list given to you today.   Labwork: None Ordered   Testing/Procedures: None Ordered   Follow-Up: Your physician wants you to follow-up in: 1 year with Dr. Nahser.  You will receive a reminder letter in the mail two months in advance. If you don't receive a letter, please call our office to schedule the follow-up appointment.   If you need a refill on your cardiac medications before your next appointment, please call your pharmacy.   Thank you for choosing [facility]! Chestine Belknap, RN [phone]    

## 2016-09-07 DIAGNOSIS — Z6837 Body mass index (BMI) 37.0-37.9, adult: Secondary | ICD-10-CM | POA: Diagnosis not present

## 2016-09-07 DIAGNOSIS — R0602 Shortness of breath: Secondary | ICD-10-CM | POA: Diagnosis not present

## 2016-09-07 DIAGNOSIS — M1712 Unilateral primary osteoarthritis, left knee: Secondary | ICD-10-CM | POA: Diagnosis not present

## 2016-09-08 ENCOUNTER — Ambulatory Visit: Payer: Medicare Other | Admitting: Cardiothoracic Surgery

## 2016-09-08 ENCOUNTER — Ambulatory Visit
Admission: RE | Admit: 2016-09-08 | Discharge: 2016-09-08 | Disposition: A | Discharge status: Home / Self Care | Payer: Medicare Other | Source: Ambulatory Visit | Attending: Cardiothoracic Surgery | Admitting: Cardiothoracic Surgery

## 2016-09-08 DIAGNOSIS — I712 Thoracic aortic aneurysm, without rupture, unspecified: Secondary | ICD-10-CM

## 2016-09-14 DIAGNOSIS — M1712 Unilateral primary osteoarthritis, left knee: Secondary | ICD-10-CM | POA: Diagnosis not present

## 2016-09-14 DIAGNOSIS — M25562 Pain in left knee: Secondary | ICD-10-CM | POA: Diagnosis not present

## 2016-09-20 DIAGNOSIS — M1712 Unilateral primary osteoarthritis, left knee: Secondary | ICD-10-CM | POA: Diagnosis not present

## 2016-09-20 DIAGNOSIS — T1592XA Foreign body on external eye, part unspecified, left eye, initial encounter: Secondary | ICD-10-CM | POA: Diagnosis not present

## 2016-09-20 DIAGNOSIS — Z6837 Body mass index (BMI) 37.0-37.9, adult: Secondary | ICD-10-CM | POA: Diagnosis not present

## 2016-09-27 DIAGNOSIS — M1712 Unilateral primary osteoarthritis, left knee: Secondary | ICD-10-CM | POA: Diagnosis not present

## 2016-10-05 ENCOUNTER — Other Ambulatory Visit: Payer: Self-pay

## 2016-10-13 ENCOUNTER — Ambulatory Visit (INDEPENDENT_AMBULATORY_CARE_PROVIDER_SITE_OTHER): Payer: Medicare Other | Admitting: Cardiothoracic Surgery

## 2016-10-13 ENCOUNTER — Other Ambulatory Visit: Payer: Self-pay | Admitting: *Deleted

## 2016-10-13 ENCOUNTER — Encounter: Payer: Self-pay | Admitting: Cardiothoracic Surgery

## 2016-10-13 VITALS — BP 118/67 | HR 69 | Resp 20 | Ht 72.0 in | Wt 262.0 lb

## 2016-10-13 DIAGNOSIS — I712 Thoracic aortic aneurysm, without rupture, unspecified: Secondary | ICD-10-CM

## 2016-10-13 DIAGNOSIS — I350 Nonrheumatic aortic (valve) stenosis: Secondary | ICD-10-CM

## 2016-10-13 NOTE — Progress Notes (Signed)
PCP is [name], MD Referring Provider is [name], MD  Chief Complaint  Patient presents with  . Thoracic Aortic Aneurysm    18 month f/u with CT Chest 09/08/16    HPI:The patient presents for scheduled follow-up with CT scan of chest. The patient underwent biologic-Bentall procedure for aortic root aneurysm with aortic insufficiency  2011.  His follow-up CT scan shows stable aortic root repair without pseudoaneurysm or dilatation of the distal arch. Echocardiogram 1.5 years ago showed EF 65% with normal acetic aortic valve function. He is in a sinus rhythm.  The patient does complain of dyspnea on exertion especially walking uphill when he gets extremely short of breath with tightness across his chest. The patient had a cardiac catheterization prior to his surgery 6 years ago which showed no significant coronary disease. The patient will undergo a Myoview scan to assess for coronary disease.  The patient has persistent problems with orthostatic dizziness.  He takes Flomax for a complex long urologic history including prostate cancer.  Past Medical History:  Diagnosis Date  . Arthritis   . Benign essential HTN 05/21/2014  . BPH (benign prostatic hyperplasia)   . Chronic cystitis    a. With ongoing hematuria.  . Contrast media allergy   . Diverticulitis    a. 2010: diverticulitis with stricture Sigmoid colectomy with mobilization of the splenic flexure within the end anastomosis.   . Dizziness and giddiness    a. H/o dizziness with reportedly negative tilt table. Holter 2012: NSR, sinus tachy, frequent PVCs, multifocal at times in a trigeminal fashion.  Marland Kitchen Dyspnea on exertion 03/18/2014  . Frequency of urination   . GERD (gastroesophageal reflux disease)   . H/O cardiac catheterization    a. 2011: minor luminal irregularities. b. Nuc 03/2014: ow risk without reversible ischemia or infarction, EF 57%. (There is septal akinesis but otherwise normal wall motion. This is  nonspecific.)  . Headache    MIGRAINES  . Hematuria   . History of urinary retention   . Hyperlipidemia 03/18/2014  . Hyponatremia    a. Remote hx hyponatremia felt 2/2 SSRI.  Marland Kitchen Nocturia   . Obesity   . Orthostatic hypotension   . Prostate cancer ([place])    a. s/p radiation.  . Rash    LEFT KNEE   . 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 Severe aortic stenosis    a. 11/2009: pericardial AVR/Bentall.  . SYNCOPE 02/18/2010   Qualifier: Diagnosis of  By: Lovena Le, MD, Martyn Malay   . Thoracic aortic aneurysm [facility])    a. 11/2009: s/p Magna Ease pericardial tissue valve size 63mm and replacement of fusiform ascending thoracic aneurysm with 30-mm Hemashield cath with hemiarch reconstruction.    Past Surgical History:  Procedure Laterality Date  . BACK SURGERY  1985  . CARPAL TUNNEL RELEASE     BIL WRISTS  . CHOLECYSTECTOMY  2010  . COLON SURGERY  2010   colon resection  . doppler carotid  2012  . DOPPLER ECHOCARDIOGRAPHY  2011  . PROSTATE SURGERY  2011   adenocarcinoma w/raddiation   . ROTATOR CUFF REPAIR     X2 LEFT SHOULDER  . THORACIC AORTIC ANEURYSM REPAIR  12/11/09   ASCENDING THORACIC AORTIC ANEURYSM REPAIR  . TISSUE AORTIC VALVE REPLACEMENT  12/11/09  . TOTAL HIP ARTHROPLASTY Left 04/17/2015   Procedure: LEFT TOTAL HIP ARTHROPLASTY ANTERIOR APPROACH;  Surgeon: Rod Can, MD;  Location: [facility];  Service: Orthopedics;  Laterality: Left;  Family History  Problem Relation Age of Onset  . Heart disease Father 39  . Stroke Mother 33  . Heart attack Father     Social History Social History  Substance Use Topics  . Smoking status: Former Smoker    Types: Cigarettes    Quit date: 03/16/1967  . Smokeless tobacco: Never Used  . Alcohol use No    Current Outpatient Prescriptions  Medication Sig Dispense Refill  . azelastine (ASTELIN) 0.1 % nasal spray Place 1 spray into both nostrils 2 (two) times daily.  11  . hydrochlorothiazide (HYDRODIURIL) 12.5 MG  tablet Take 25 mg by mouth daily.   3  . lansoprazole (PREVACID) 30 MG capsule Take 30 mg by mouth every morning.    Marland Kitchen losartan (COZAAR) 100 MG tablet Take 100 mg by mouth daily.  4  . simvastatin (ZOCOR) 20 MG tablet Take 20 mg by mouth daily.     . Tamsulosin HCl (FLOMAX PO) Take 0.4 mg by mouth every morning.      No current facility-administered medications for this visit.       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 Rise Patience tach Patient has had no difficulty swallowing or change in vision No hematuria positive nocturia  BP 118/67   Pulse 69   Resp 20   Ht 6' (1.829 m)   Wt 262 lb (118.8 kg)   SpO2 93% Comment: RA  BMI 35.53 kg/m  Physical Exam      Exam    General- alert and comfortable   Lungs- clear without rales, wheezes   Cor- regular rate and rhythm, no murmur , gallop   Abdomen- soft, non-tender   Extremities - warm, non-tender, minimal edema   Neuro- oriented, appropriate, no focal weakness   Diagnostic Tests: CT scan shows an intact aortic root repair. No evidence of pseudoaneurysm or dilatation of the distal arch.  Impression: Patient's aortic valve function and aortic repair are stable. No further CT scans will be needed. However his dyspnea and chest tightness on exertion needs to be evaluated a Myoview scan is ordered to assess for coronary disease.  Plan: Return for discussion after Myoview scan.  Len Childs, MD [facility] [phone]

## 2016-10-14 ENCOUNTER — Telehealth (HOSPITAL_COMMUNITY): Payer: Self-pay | Admitting: *Deleted

## 2016-10-14 NOTE — Telephone Encounter (Signed)
Attempted to call patient regarding upcoming nuclear appointment- no answer, unable to leave message.  Jeff Johnson

## 2016-10-19 ENCOUNTER — Telehealth (HOSPITAL_COMMUNITY): Payer: Self-pay | Admitting: *Deleted

## 2016-10-19 NOTE — Telephone Encounter (Signed)
Patient given detailed instructions per Myocardial Perfusion Study Information Sheet for the test on 10/20/16 at 7:15. Patient notified to arrive 15 minutes early and that it is imperative to arrive on time for appointment to keep from having the test rescheduled.  If you need to cancel or reschedule your appointment, please call the office within 24 hours of your appointment. . Patient verbalized understanding.Jeff Johnson

## 2016-10-20 ENCOUNTER — Ambulatory Visit (HOSPITAL_COMMUNITY): Payer: Medicare Other | Attending: Cardiology

## 2016-10-20 ENCOUNTER — Encounter (HOSPITAL_COMMUNITY): Payer: Medicare Other

## 2016-10-20 DIAGNOSIS — R079 Chest pain, unspecified: Secondary | ICD-10-CM | POA: Insufficient documentation

## 2016-10-20 DIAGNOSIS — R0602 Shortness of breath: Secondary | ICD-10-CM | POA: Insufficient documentation

## 2016-10-20 DIAGNOSIS — I2510 Atherosclerotic heart disease of native coronary artery without angina pectoris: Secondary | ICD-10-CM | POA: Diagnosis not present

## 2016-10-20 DIAGNOSIS — I350 Nonrheumatic aortic (valve) stenosis: Secondary | ICD-10-CM

## 2016-10-20 DIAGNOSIS — I10 Essential (primary) hypertension: Secondary | ICD-10-CM | POA: Diagnosis not present

## 2016-10-20 DIAGNOSIS — R0609 Other forms of dyspnea: Secondary | ICD-10-CM | POA: Diagnosis not present

## 2016-10-20 LAB — MYOCARDIAL PERFUSION IMAGING
LV dias vol: 84 mL (ref 62–150)
LV sys vol: 35 mL
Peak HR: 70 {beats}/min
RATE: 0.41
Rest HR: 54 {beats}/min
SDS: 0
SRS: 5
SSS: 5
TID: 0.83

## 2016-10-20 MED ORDER — TECHNETIUM TC 99M TETROFOSMIN IV KIT
10.8000 | PACK | Freq: Once | INTRAVENOUS | Status: AC | PRN
  Administered 2016-10-20: 10.8 via INTRAVENOUS
  Filled 2016-10-20: qty 11

## 2016-10-20 MED ORDER — REGADENOSON 0.4 MG/5ML IV SOLN
0.4000 mg | Freq: Once | INTRAVENOUS | Status: AC
  Administered 2016-10-20: 0.4 mg via INTRAVENOUS

## 2016-10-20 MED ORDER — TECHNETIUM TC 99M TETROFOSMIN IV KIT
32.7000 | PACK | Freq: Once | INTRAVENOUS | Status: AC | PRN
  Administered 2016-10-20: 32.7 via INTRAVENOUS
  Filled 2016-10-20: qty 33

## 2016-11-17 ENCOUNTER — Encounter: Payer: Self-pay | Admitting: Cardiothoracic Surgery

## 2016-11-17 ENCOUNTER — Ambulatory Visit (INDEPENDENT_AMBULATORY_CARE_PROVIDER_SITE_OTHER): Payer: Medicare Other | Admitting: Cardiothoracic Surgery

## 2016-11-17 VITALS — BP 102/68 | HR 65 | Resp 20 | Ht 72.0 in | Wt 268.0 lb

## 2016-11-17 DIAGNOSIS — I350 Nonrheumatic aortic (valve) stenosis: Secondary | ICD-10-CM

## 2016-11-17 DIAGNOSIS — I712 Thoracic aortic aneurysm, without rupture, unspecified: Secondary | ICD-10-CM

## 2016-11-17 NOTE — Progress Notes (Signed)
PCP is [name], MD Referring Provider is [name], MD  Chief Complaint  Patient presents with  . Thoracic Aortic Aneurysm    1 month f/u review gated myocardial perfusion imaging 10/20/16  Status post biologic-Bentall root replacement 2011 for ascending aneurysm with aortic insufficiency  HPI: Patient returns for scheduled visit after Myoview myocardial perfusion image. The study was normal. EF was 60%. No evidence of ischemia. No ST segment changes during stress.  The patient had an echocardiogram 2 years ago showing normal aortic valve function, low mean gradient of 7 mmHg, no AI.  Last CT of thoracic aorta showed aortic repair intact, no evidence of false aneurysm or further aortic dilatation.  Past Medical History:  Diagnosis Date  . Arthritis   . Benign essential HTN 05/21/2014  . BPH (benign prostatic hyperplasia)   . Chronic cystitis    a. With ongoing hematuria.  . Contrast media allergy   . Diverticulitis    a. 2010: diverticulitis with stricture Sigmoid colectomy with mobilization of the splenic flexure within the end anastomosis.   . Dizziness and giddiness    a. H/o dizziness with reportedly negative tilt table. Holter 2012: NSR, sinus tachy, frequent PVCs, multifocal at times in a trigeminal fashion.  Marland Kitchen Dyspnea on exertion 03/18/2014  . Frequency of urination   . GERD (gastroesophageal reflux disease)   . H/O cardiac catheterization    a. 2011: minor luminal irregularities. b. Nuc 03/2014: ow risk without reversible ischemia or infarction, EF 57%. (There is septal akinesis but otherwise normal wall motion. This is nonspecific.)  . Headache    MIGRAINES  . Hematuria   . History of urinary retention   . Hyperlipidemia 03/18/2014  . Hyponatremia    a. Remote hx hyponatremia felt 2/2 SSRI.  Marland Kitchen Nocturia   . Obesity   . Orthostatic hypotension   . Prostate cancer ([place])    a. s/p radiation.  . Rash    LEFT KNEE   . S/P AVR (aortic valve replacement)  03/18/2014  . Severe aortic stenosis    a. 11/2009: pericardial AVR/Bentall.  . SYNCOPE 02/18/2010   Qualifier: Diagnosis of  By: Lovena Le, MD, Martyn Malay   . Thoracic aortic aneurysm [facility])    a. 11/2009: s/p Magna Ease pericardial tissue valve size 72mm and replacement of fusiform ascending thoracic aneurysm with 30-mm Hemashield cath with hemiarch reconstruction.    Past Surgical History:  Procedure Laterality Date  . BACK SURGERY  1985  . CARPAL TUNNEL RELEASE     BIL WRISTS  . CHOLECYSTECTOMY  2010  . COLON SURGERY  2010   colon resection  . doppler carotid  2012  . DOPPLER ECHOCARDIOGRAPHY  2011  . PROSTATE SURGERY  2011   adenocarcinoma w/raddiation   . ROTATOR CUFF REPAIR     X2 LEFT SHOULDER  . THORACIC AORTIC ANEURYSM REPAIR  12/11/09   ASCENDING THORACIC AORTIC ANEURYSM REPAIR  . TISSUE AORTIC VALVE REPLACEMENT  12/11/09  . TOTAL HIP ARTHROPLASTY Left 04/17/2015   Procedure: LEFT TOTAL HIP ARTHROPLASTY ANTERIOR APPROACH;  Surgeon: Rod Can, MD;  Location: [facility];  Service: Orthopedics;  Laterality: Left;    Family History  Problem Relation Age of Onset  . Heart disease Father 42  . Stroke Mother 53  . Heart attack Father     Social History Social History  Substance Use Topics  . Smoking status: Former Smoker    Types: Cigarettes    Quit date: 03/16/1967  . Smokeless  tobacco: Never Used  . Alcohol use No    Current Outpatient Prescriptions  Medication Sig Dispense Refill  . azelastine (ASTELIN) 0.1 % nasal spray Place 1 spray into both nostrils 2 (two) times daily.  11  . hydrochlorothiazide (HYDRODIURIL) 12.5 MG tablet Take 25 mg by mouth daily.   3  . lansoprazole (PREVACID) 30 MG capsule Take 30 mg by mouth every morning.    Marland Kitchen losartan (COZAAR) 100 MG tablet Take 100 mg by mouth daily.  4  . simvastatin (ZOCOR) 20 MG tablet Take 20 mg by mouth daily.     . Tamsulosin HCl (FLOMAX PO) Take 0.4 mg by mouth every morning.      No current  facility-administered medications for this visit.       Review of Systems  Baseline problems with arthritis, orthostatic dizziness, nocturia, urgency with urination.  BP 102/68   Pulse 65   Resp 20   Ht 6' (1.829 m)   Wt 268 lb (121.6 kg)   SpO2 98% Comment: RA  BMI 36.35 kg/m  Physical Exam      Exam    General- alert and comfortable   Lungs- clear without rales, wheezes   Cor- regular rate and rhythm, no murmur , gallop   Abdomen- soft, non-tender   Extremities - warm, non-tender, minimal edema   Neuro- oriented, appropriate, no focal weakness, uses a cane because of left hip pain   Diagnostic Tests: Myoview scan results discussed with patient, normal study  Impression: No evidence of coronary disease to explain his shortness of breath. Probably related to obesity, deconditioning Blood pressure is well regulated. Patient remains in sinus rhythm. He is not anemic. Plan: Patient return for review of his status in one year.  Len Childs, MD [facility] [phone]

## 2016-11-18 DIAGNOSIS — Z23 Encounter for immunization: Secondary | ICD-10-CM | POA: Diagnosis not present

## 2016-12-16 DIAGNOSIS — R0602 Shortness of breath: Secondary | ICD-10-CM | POA: Diagnosis not present

## 2016-12-16 DIAGNOSIS — H6123 Impacted cerumen, bilateral: Secondary | ICD-10-CM | POA: Diagnosis not present

## 2016-12-16 DIAGNOSIS — J329 Chronic sinusitis, unspecified: Secondary | ICD-10-CM | POA: Diagnosis not present

## 2016-12-16 DIAGNOSIS — J069 Acute upper respiratory infection, unspecified: Secondary | ICD-10-CM | POA: Diagnosis not present

## 2016-12-28 DIAGNOSIS — C61 Malignant neoplasm of prostate: Secondary | ICD-10-CM | POA: Diagnosis not present

## 2016-12-30 DIAGNOSIS — K219 Gastro-esophageal reflux disease without esophagitis: Secondary | ICD-10-CM | POA: Diagnosis not present

## 2016-12-30 DIAGNOSIS — M19041 Primary osteoarthritis, right hand: Secondary | ICD-10-CM | POA: Diagnosis not present

## 2016-12-30 DIAGNOSIS — E782 Mixed hyperlipidemia: Secondary | ICD-10-CM | POA: Diagnosis not present

## 2016-12-30 DIAGNOSIS — I10 Essential (primary) hypertension: Secondary | ICD-10-CM | POA: Diagnosis not present

## 2017-01-05 DIAGNOSIS — Z8546 Personal history of malignant neoplasm of prostate: Secondary | ICD-10-CM | POA: Diagnosis not present

## 2017-01-05 DIAGNOSIS — N5201 Erectile dysfunction due to arterial insufficiency: Secondary | ICD-10-CM | POA: Diagnosis not present

## 2017-02-07 ENCOUNTER — Ambulatory Visit (INDEPENDENT_AMBULATORY_CARE_PROVIDER_SITE_OTHER): Payer: Medicare Other

## 2017-02-07 ENCOUNTER — Ambulatory Visit (INDEPENDENT_AMBULATORY_CARE_PROVIDER_SITE_OTHER): Payer: Medicare Other | Admitting: Orthopaedic Surgery

## 2017-02-07 DIAGNOSIS — M19041 Primary osteoarthritis, right hand: Secondary | ICD-10-CM | POA: Diagnosis not present

## 2017-02-07 DIAGNOSIS — M79641 Pain in right hand: Secondary | ICD-10-CM

## 2017-02-07 NOTE — Progress Notes (Signed)
Office Visit Note   Patient: Jeff Johnson           Date of Birth: September 10, 1945           MRN: [id number] Visit Date: 02/07/2017              Requested by: [name], [address] [place], [place] [address] PCP: [name], MD   Assessment & Plan: Visit Diagnoses:  1. Pain in right hand   2. Arthritis of right hand     Plan: Impression is right hand osteoarthritis.  Patient has had aortic valve replacement and she not take oral NSAIDs.  He is already using topical NSAIDs.  Patient understands that this is mainly a chronic issue.  Follow-up as needed. Total face to face encounter time was greater than 45 minutes and over half of this time was spent in counseling and/or coordination of care.  Follow-Up Instructions: Return if symptoms worsen or fail to improve.   Orders:  Orders Placed This Encounter  Procedures  . XR Hand Complete Right   No orders of the defined types were placed in this encounter.     Procedures: No procedures performed   Clinical Data: No additional findings.   Subjective: Chief Complaint  Patient presents with  . Right Hand - Pain    Patient comes in today with right hand pain and stiffness that is constant and worse with weather changes.  He endorses some numbness in his ulnar 2 digits.  Denies any elbow symptoms or any injuries.  Pain is worse at night in his hands.  It gets worse throughout the day.  He takes Tylenol and uses Voltaren gel.    Review of Systems  Constitutional: Negative.   All other systems reviewed and are negative.    Objective: Vital Signs: There were no vitals taken for this visit.  Physical Exam  Constitutional: He is oriented to person, place, and time. He appears well-developed and well-nourished.  HENT:  Head: Normocephalic and atraumatic.  Eyes: Pupils are equal, round, and reactive to light.  Neck: Neck supple.  Pulmonary/Chest: Effort normal.  Abdominal: Soft.    Musculoskeletal: Normal range of motion.  Neurological: He is alert and oriented to person, place, and time.  Skin: Skin is warm.  Psychiatric: He has a normal mood and affect. His behavior is normal. Judgment and thought content normal.  Nursing note and vitals reviewed.   Ortho Exam Right hand exam shows no obvious muscular atrophy.  He does have stiffness of his inner phalangeal joints.  First dorsal interosseous muscle is intact with good strength.  No pathologic signs for ulnar neuropathy.  Negative Tinel's at the cubital tunnel.  Ulnar nerve is stable. Specialty Comments:  No specialty comments available.  Imaging: No results found.   PMFS History: Patient Active Problem List   Diagnosis Date Noted  . Arthritis of right hand 02/07/2017  . Pain in right hand 02/07/2017  . 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 Essential hypertension 05/21/2014  . Orthostatic hypotension   . Elevated BP   . BPH (benign prostatic hyperplasia)   . Diverticulitis   . Hyponatremia   . H/O cardiac catheterization   . Hematuria   . Severe aortic stenosis s/p pericardial AVR, Bentall in 2011   . Thoracic aortic aneurysm ([place])   . Contrast media allergy   . Obesity   . Prostate cancer ([place])   . Chronic cystitis   .  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 Dyspnea on exertion 03/18/2014  . Hyperlipidemia 03/18/2014  . GERD (gastroesophageal reflux disease) 03/18/2014  . SYNCOPE 02/18/2010  . ORTHOSTATIC DIZZINESS 02/18/2010   Past Medical History:  Diagnosis Date  . Arthritis   . Benign essential HTN 05/21/2014  . BPH (benign prostatic hyperplasia)   . Chronic cystitis    a. With ongoing hematuria.  . Contrast media allergy   . Diverticulitis    a. 2010: diverticulitis with stricture Sigmoid colectomy with mobilization of the splenic flexure within the end anastomosis.   . Dizziness and giddiness    a. H/o dizziness with reportedly negative tilt table. Holter 2012:  NSR, sinus tachy, frequent PVCs, multifocal at times in a trigeminal fashion.  Marland Kitchen Dyspnea on exertion 03/18/2014  . Frequency of urination   . GERD (gastroesophageal reflux disease)   . H/O cardiac catheterization    a. 2011: minor luminal irregularities. b. Nuc 03/2014: ow risk without reversible ischemia or infarction, EF 57%. (There is septal akinesis but otherwise normal wall motion. This is nonspecific.)  . Headache    MIGRAINES  . Hematuria   . History of urinary retention   . Hyperlipidemia 03/18/2014  . Hyponatremia    a. Remote hx hyponatremia felt 2/2 SSRI.  Marland Kitchen Nocturia   . Obesity   . Orthostatic hypotension   . Prostate cancer ([place])    a. s/p radiation.  . Rash    LEFT KNEE   . 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 Severe aortic stenosis    a. 11/2009: pericardial AVR/Bentall.  . SYNCOPE 02/18/2010   Qualifier: Diagnosis of  By: Lovena Le, MD, Martyn Malay   . Thoracic aortic aneurysm [facility])    a. 11/2009: s/p Magna Ease pericardial tissue valve size 68mm and replacement of fusiform ascending thoracic aneurysm with 30-mm Hemashield cath with hemiarch reconstruction.    Family History  Problem Relation Age of Onset  . Heart disease Father 75  . Stroke Mother 22  . Heart attack Father     Past Surgical History:  Procedure Laterality Date  . BACK SURGERY  1985  . CARPAL TUNNEL RELEASE     BIL WRISTS  . CHOLECYSTECTOMY  2010  . COLON SURGERY  2010   colon resection  . doppler carotid  2012  . DOPPLER ECHOCARDIOGRAPHY  2011  . PROSTATE SURGERY  2011   adenocarcinoma w/raddiation   . ROTATOR CUFF REPAIR     X2 LEFT SHOULDER  . THORACIC AORTIC ANEURYSM REPAIR  12/11/09   ASCENDING THORACIC AORTIC ANEURYSM REPAIR  . TISSUE AORTIC VALVE REPLACEMENT  12/11/09  . TOTAL HIP ARTHROPLASTY Left 04/17/2015   Procedure: LEFT TOTAL HIP ARTHROPLASTY ANTERIOR APPROACH;  Surgeon: Rod Can, MD;  Location: [facility];  Service: Orthopedics;  Laterality: Left;   Social  History   Occupational History  . Not on file  Tobacco Use  . Smoking status: Former Smoker    Types: Cigarettes    Last attempt to quit: 03/16/1967    Years since quitting: 49.9  . Smokeless tobacco: Never Used  Substance and Sexual Activity  . Alcohol use: No  . Drug use: No  . Sexual activity: Not on file

## 2017-02-19 DIAGNOSIS — J018 Other acute sinusitis: Secondary | ICD-10-CM | POA: Diagnosis not present

## 2017-03-06 DIAGNOSIS — J018 Other acute sinusitis: Secondary | ICD-10-CM | POA: Diagnosis not present

## 2017-04-04 DIAGNOSIS — E782 Mixed hyperlipidemia: Secondary | ICD-10-CM | POA: Diagnosis not present

## 2017-04-04 DIAGNOSIS — I10 Essential (primary) hypertension: Secondary | ICD-10-CM | POA: Diagnosis not present

## 2017-04-04 DIAGNOSIS — E559 Vitamin D deficiency, unspecified: Secondary | ICD-10-CM | POA: Diagnosis not present

## 2017-04-04 DIAGNOSIS — Z79899 Other long term (current) drug therapy: Secondary | ICD-10-CM | POA: Diagnosis not present

## 2017-04-06 DIAGNOSIS — I10 Essential (primary) hypertension: Secondary | ICD-10-CM | POA: Diagnosis not present

## 2017-04-06 DIAGNOSIS — E559 Vitamin D deficiency, unspecified: Secondary | ICD-10-CM | POA: Diagnosis not present

## 2017-04-06 DIAGNOSIS — Z6837 Body mass index (BMI) 37.0-37.9, adult: Secondary | ICD-10-CM | POA: Diagnosis not present

## 2017-04-06 DIAGNOSIS — M1990 Unspecified osteoarthritis, unspecified site: Secondary | ICD-10-CM | POA: Insufficient documentation

## 2017-04-06 DIAGNOSIS — E782 Mixed hyperlipidemia: Secondary | ICD-10-CM | POA: Diagnosis not present

## 2017-04-06 DIAGNOSIS — R5383 Other fatigue: Secondary | ICD-10-CM | POA: Diagnosis not present

## 2017-04-08 DIAGNOSIS — M1612 Unilateral primary osteoarthritis, left hip: Secondary | ICD-10-CM | POA: Diagnosis not present

## 2017-04-08 DIAGNOSIS — Z96642 Presence of left artificial hip joint: Secondary | ICD-10-CM | POA: Diagnosis not present

## 2017-04-08 DIAGNOSIS — M1712 Unilateral primary osteoarthritis, left knee: Secondary | ICD-10-CM | POA: Diagnosis not present

## 2017-04-08 DIAGNOSIS — Z471 Aftercare following joint replacement surgery: Secondary | ICD-10-CM | POA: Diagnosis not present

## 2017-04-09 DIAGNOSIS — Z96642 Presence of left artificial hip joint: Secondary | ICD-10-CM | POA: Insufficient documentation

## 2017-04-18 DIAGNOSIS — M1712 Unilateral primary osteoarthritis, left knee: Secondary | ICD-10-CM | POA: Diagnosis not present

## 2017-04-18 DIAGNOSIS — M25552 Pain in left hip: Secondary | ICD-10-CM | POA: Diagnosis not present

## 2017-04-22 DIAGNOSIS — M25552 Pain in left hip: Secondary | ICD-10-CM | POA: Insufficient documentation

## 2017-04-22 DIAGNOSIS — M1712 Unilateral primary osteoarthritis, left knee: Secondary | ICD-10-CM | POA: Diagnosis not present

## 2017-04-25 DIAGNOSIS — M1712 Unilateral primary osteoarthritis, left knee: Secondary | ICD-10-CM | POA: Diagnosis not present

## 2017-04-25 DIAGNOSIS — M25552 Pain in left hip: Secondary | ICD-10-CM | POA: Diagnosis not present

## 2017-05-02 DIAGNOSIS — M25552 Pain in left hip: Secondary | ICD-10-CM | POA: Diagnosis not present

## 2017-05-02 DIAGNOSIS — M1712 Unilateral primary osteoarthritis, left knee: Secondary | ICD-10-CM | POA: Diagnosis not present

## 2017-05-06 DIAGNOSIS — M1712 Unilateral primary osteoarthritis, left knee: Secondary | ICD-10-CM | POA: Diagnosis not present

## 2017-05-06 DIAGNOSIS — M25552 Pain in left hip: Secondary | ICD-10-CM | POA: Diagnosis not present

## 2017-05-09 DIAGNOSIS — M25552 Pain in left hip: Secondary | ICD-10-CM | POA: Diagnosis not present

## 2017-05-09 DIAGNOSIS — M1712 Unilateral primary osteoarthritis, left knee: Secondary | ICD-10-CM | POA: Diagnosis not present

## 2017-05-13 DIAGNOSIS — M1712 Unilateral primary osteoarthritis, left knee: Secondary | ICD-10-CM | POA: Diagnosis not present

## 2017-05-13 DIAGNOSIS — M25552 Pain in left hip: Secondary | ICD-10-CM | POA: Diagnosis not present

## 2017-06-01 DIAGNOSIS — E782 Mixed hyperlipidemia: Secondary | ICD-10-CM | POA: Diagnosis not present

## 2017-06-01 DIAGNOSIS — R945 Abnormal results of liver function studies: Secondary | ICD-10-CM | POA: Diagnosis not present

## 2017-06-03 DIAGNOSIS — Z6838 Body mass index (BMI) 38.0-38.9, adult: Secondary | ICD-10-CM | POA: Diagnosis not present

## 2017-06-03 DIAGNOSIS — M1712 Unilateral primary osteoarthritis, left knee: Secondary | ICD-10-CM | POA: Diagnosis not present

## 2017-06-03 DIAGNOSIS — C4491 Basal cell carcinoma of skin, unspecified: Secondary | ICD-10-CM | POA: Diagnosis not present

## 2017-06-03 DIAGNOSIS — R739 Hyperglycemia, unspecified: Secondary | ICD-10-CM | POA: Diagnosis not present

## 2017-06-03 DIAGNOSIS — E782 Mixed hyperlipidemia: Secondary | ICD-10-CM | POA: Diagnosis not present

## 2017-06-03 DIAGNOSIS — I10 Essential (primary) hypertension: Secondary | ICD-10-CM | POA: Diagnosis not present

## 2017-06-06 ENCOUNTER — Encounter: Payer: Self-pay | Admitting: Gastroenterology

## 2017-06-24 ENCOUNTER — Other Ambulatory Visit: Payer: Self-pay | Admitting: Family Medicine

## 2017-06-24 ENCOUNTER — Ambulatory Visit
Admission: RE | Admit: 2017-06-24 | Discharge: 2017-06-24 | Disposition: A | Discharge status: Home / Self Care | Payer: Medicare Other | Source: Ambulatory Visit | Attending: Family Medicine | Admitting: Family Medicine

## 2017-06-24 DIAGNOSIS — M170 Bilateral primary osteoarthritis of knee: Secondary | ICD-10-CM

## 2017-06-24 DIAGNOSIS — R739 Hyperglycemia, unspecified: Secondary | ICD-10-CM | POA: Diagnosis not present

## 2017-06-24 DIAGNOSIS — M1711 Unilateral primary osteoarthritis, right knee: Secondary | ICD-10-CM | POA: Diagnosis not present

## 2017-06-24 DIAGNOSIS — M1712 Unilateral primary osteoarthritis, left knee: Secondary | ICD-10-CM | POA: Diagnosis not present

## 2017-06-24 DIAGNOSIS — Z6837 Body mass index (BMI) 37.0-37.9, adult: Secondary | ICD-10-CM | POA: Diagnosis not present

## 2017-07-09 IMAGING — CT CT CHEST W/O CM
3 of 4 series · 16 of 30 positions shown, 17 images · non-contrast
Comparison: CT scan of March 27, 2014.

CLINICAL DATA: Ascending thoracic aortic aneurysm. Status post
aortic valve replacement.

EXAM:
CT CHEST WITHOUT CONTRAST
TECHNIQUE: Multidetector CT imaging of the chest was performed following the
standard protocol without IV contrast.

[Series 3: chest w/o · axial · non-contrast · 0.79mm/px · z∈[-212,-32]mm · 4 of 62 slices shown, 5 images]
[im 13/62  mediastinal]
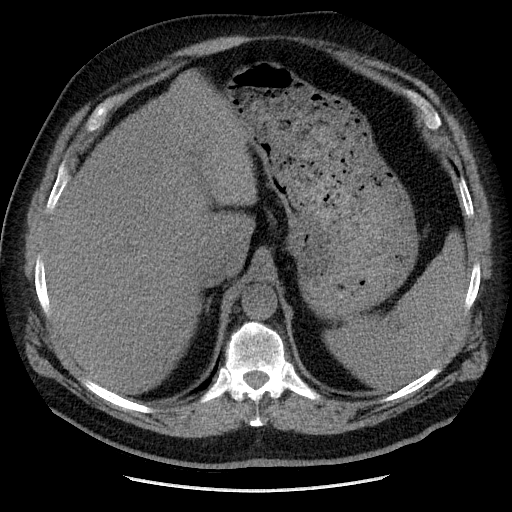
[im 13/62  lung]
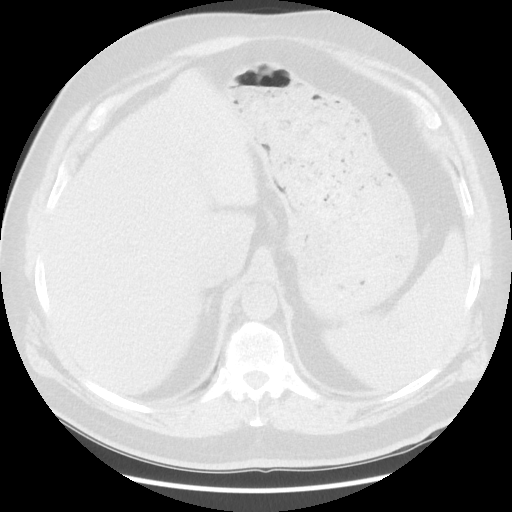
[im 25/62  lung]
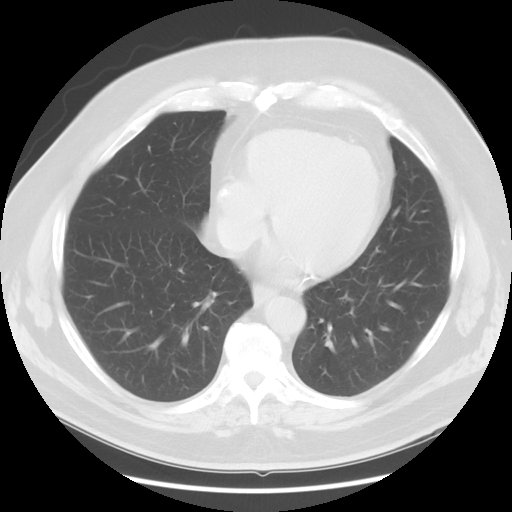
[im 37/62  lung]
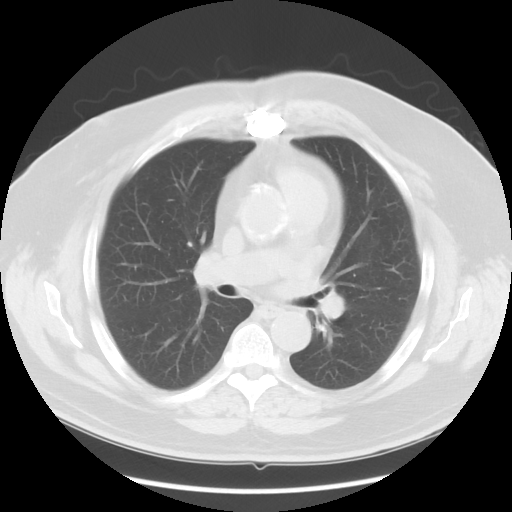
[im 49/62  lung]
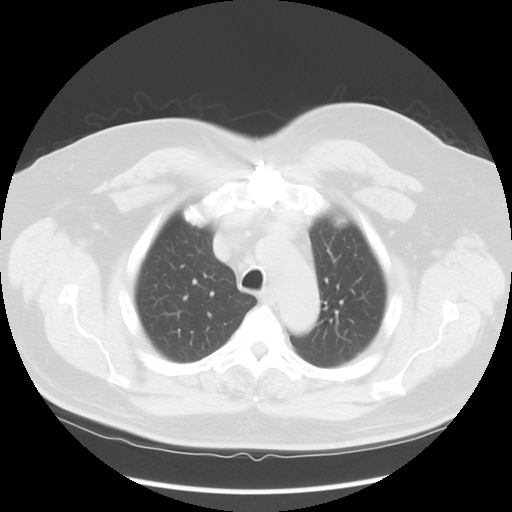

[Series 4: lung windows · axial · 0.79mm/px · z∈[-212,-32]mm · 4 of 62 slices shown]
[im 13/62  lung]
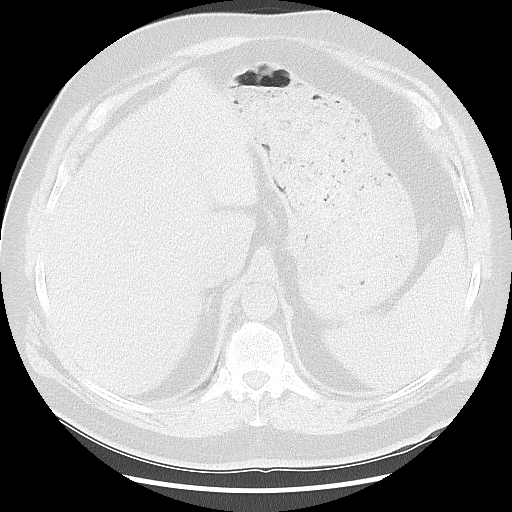
[im 25/62  lung]
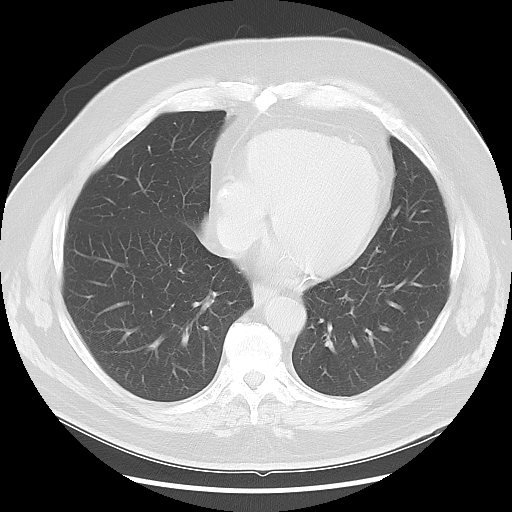
[im 37/62  lung]
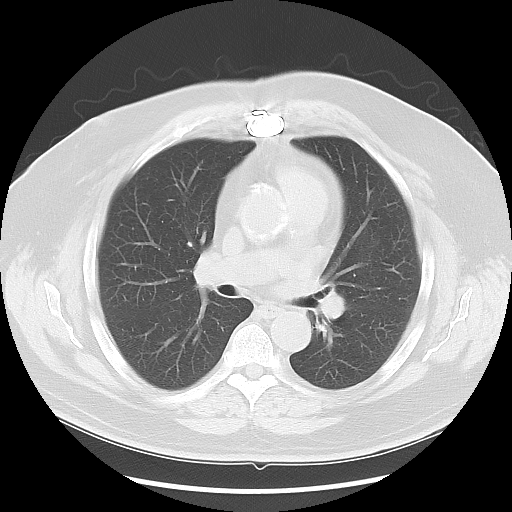
[im 49/62  lung]
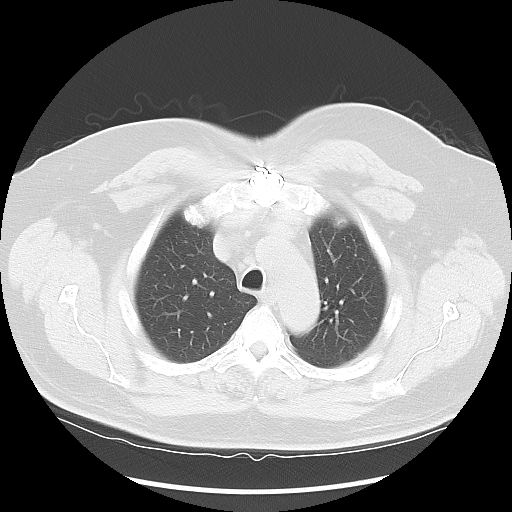

[Series 602: sagittal body · sagittal · 0.79mm/px · 8 of 163 slices shown]
[im 11/163  mediastinal]
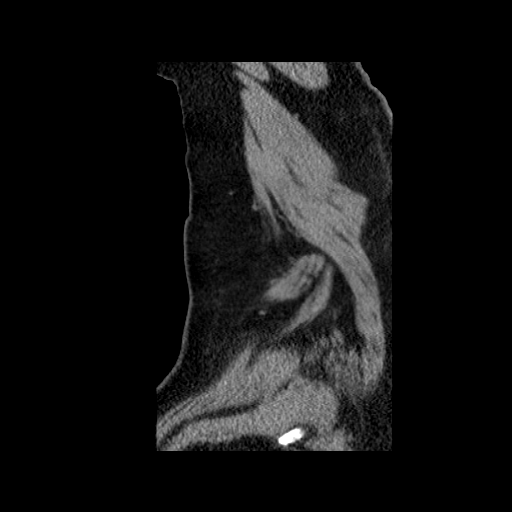
[im 33/163  mediastinal]
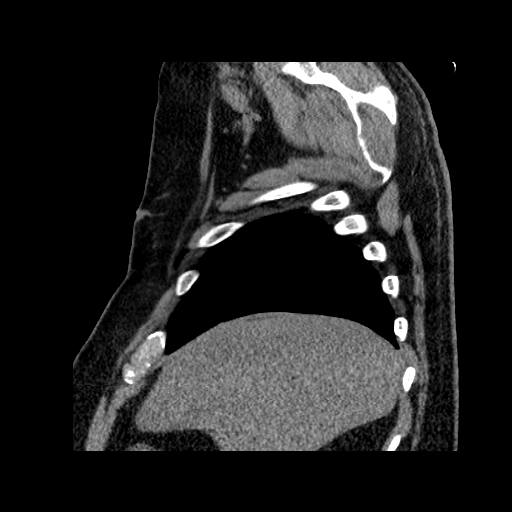
[im 55/163  mediastinal]
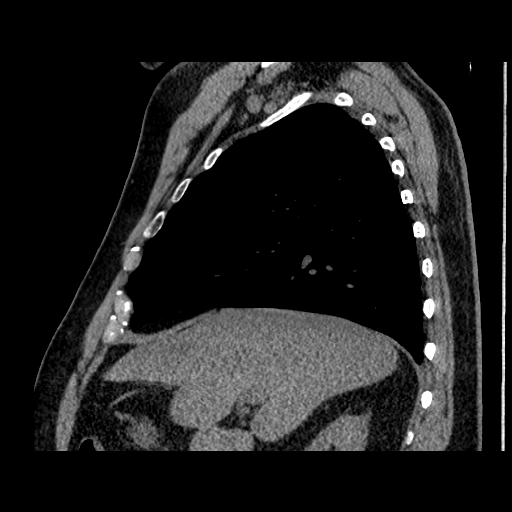
[im 76/163  mediastinal]
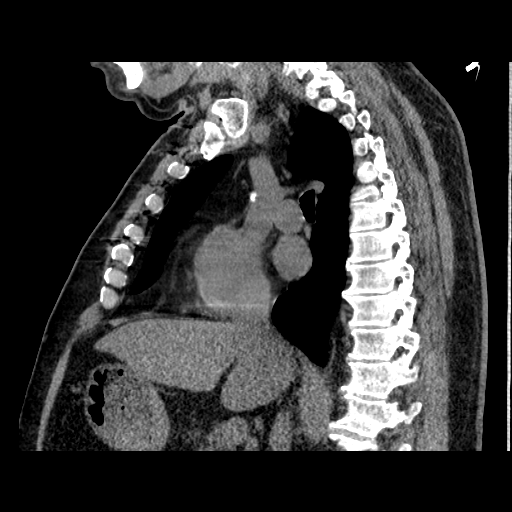
[im 87/163  mediastinal]
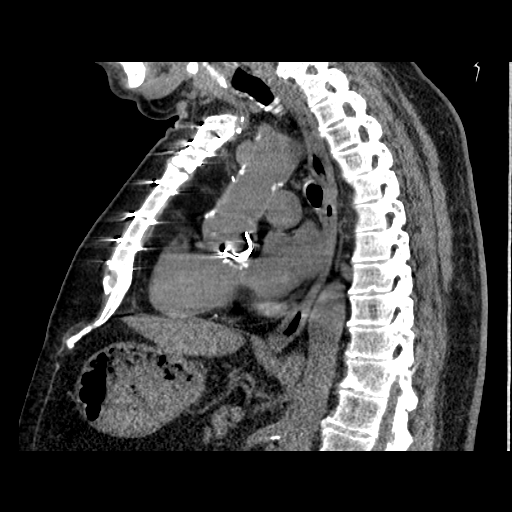
[im 109/163  mediastinal]
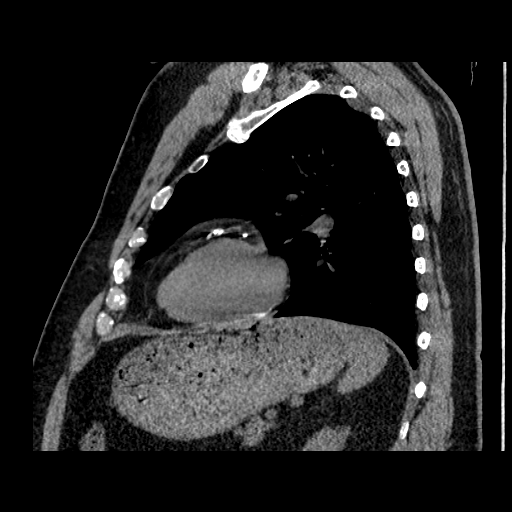
[im 130/163  mediastinal]
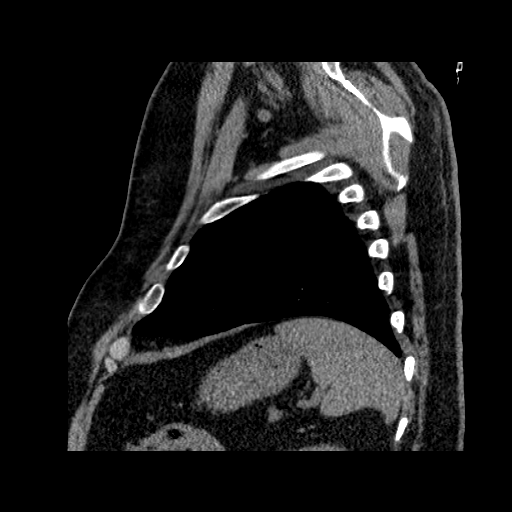
[im 152/163  mediastinal]
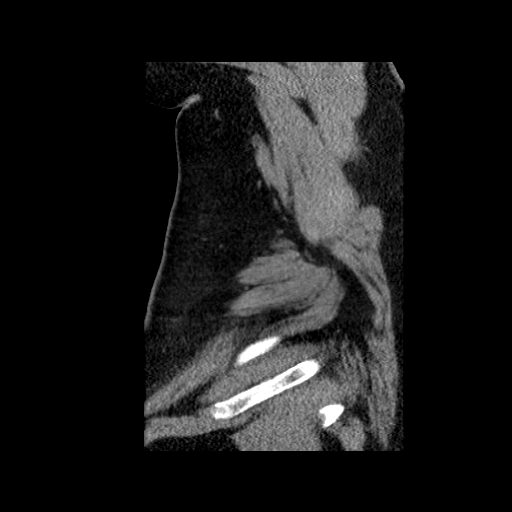

[16 of 30 positions shown; findings below may reference images not displayed]

FINDINGS: Aortic valve prosthesis is again noted. Ascending thoracic aorta has
maximum measured diameter of 4.6 cm at sinus of Valsalva. Status
post ascending aortic graft placement as well which is stable. No
pneumothorax or pleural effusion is noted. Stable 4.8 mm nodular
density is noted laterally in the left lower lobe best seen on image
number 34 of series 4. No acute pulmonary disease is noted. Coronary
artery calcifications are again noted. Visualized portion of upper
abdomen is unremarkable. No mediastinal mass or adenopathy is noted.
Transverse aortic arch measures 3.3 cm in size. Descending thoracic
aorta measures 3.0 cm. Sternotomy wires are noted.
IMPRESSION: Status post aortic valve replacement and ascending aortic graft
placement. Ascending thoracic aorta has maximum measured diameter
4.6 cm at sinus of Valsalva which is grossly stable compared to
prior exam. Ascending thoracic aortic aneurysm. Recommend
semi-annual imaging followup by CTA or MRA and referral to
cardiothoracic surgery if not already obtained. This recommendation
follows 6757 ACCF/AHA/AATS/ACR/ASA/SCA/BLAIN/GERAINT JOHN/CECILLE/AARON MXAA Guidelines
for the Diagnosis and Management of Patients With Thoracic Aortic
Disease. Circulation. 6757; 121: e266-e369.

Stable 4.8 mm nodule seen laterally in left lower lobe.

## 2017-07-20 ENCOUNTER — Ambulatory Visit (INDEPENDENT_AMBULATORY_CARE_PROVIDER_SITE_OTHER): Payer: Medicare Other | Admitting: Gastroenterology

## 2017-07-20 VITALS — BP 104/66 | HR 64 | Ht 72.0 in | Wt 256.0 lb

## 2017-07-20 DIAGNOSIS — I712 Thoracic aortic aneurysm, without rupture, unspecified: Secondary | ICD-10-CM

## 2017-07-20 DIAGNOSIS — I350 Nonrheumatic aortic (valve) stenosis: Secondary | ICD-10-CM

## 2017-07-20 DIAGNOSIS — Z1211 Encounter for screening for malignant neoplasm of colon: Secondary | ICD-10-CM | POA: Diagnosis not present

## 2017-07-20 NOTE — Progress Notes (Signed)
[facility] Consult Note:  History: Jeff Johnson 07/20/2017  Referring physician: [name], MD  Reason for consult/chief complaint: Colonoscopy (Discuss a colon, hx of colon resection dur to diverticulitis )   Subjective  HPI:  This is a very pleasant 72 year old man referred by primary care to discuss colon cancer screening.  His last colonoscopy in September 2008 by Dr. Howell Rucks was reviewed.  There was diverticulosis and no polyps were found.  [name] denies change in bowel habits, rectal bleeding or chronic abdominal pain.  He expresses concern about whether colonoscopy is the right screening test for him given his cardiac history and previous surgery.  After his colonoscopy, he had continued problems with diverticulitis and eventually had a sigmoid resection.  He also had repair of an aortic aneurysm and aortic valve replacement.  He denies dysphagia, odynophagia, nausea, vomiting, early satiety or weight loss. ROS:  Review of Systems He has dyspnea with exertion, sometimes lightheaded when he stands, denies chest pain or dysuria  He has urinary frequency  Past Medical History: Past Medical History:  Diagnosis Date  . Arthritis   . Benign essential HTN 05/21/2014  . BPH (benign prostatic hyperplasia)   . Chronic cystitis    a. With ongoing hematuria.  . Contrast media allergy   . Diverticulitis    a. 2010: diverticulitis with stricture Sigmoid colectomy with mobilization of the splenic flexure within the end anastomosis.   . Dizziness and giddiness    a. H/o dizziness with reportedly negative tilt table. Holter 2012: NSR, sinus tachy, frequent PVCs, multifocal at times in a trigeminal fashion.  Marland Kitchen Dyspnea on exertion 03/18/2014  . Frequency of urination   . GERD (gastroesophageal reflux disease)   . H/O cardiac catheterization    a. 2011: minor luminal irregularities. b. Nuc 03/2014: ow risk without reversible ischemia or infarction, EF  57%. (There is septal akinesis but otherwise normal wall motion. This is nonspecific.)  . Headache    MIGRAINES  . Hematuria   . History of urinary retention   . Hyperlipidemia 03/18/2014  . Hyponatremia    a. Remote hx hyponatremia felt 2/2 SSRI.  Marland Kitchen Nocturia   . Obesity   . Orthostatic hypotension   . Prostate cancer ([place])    a. s/p radiation.  . Rash    LEFT KNEE   . 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 Severe aortic stenosis    a. 11/2009: pericardial AVR/Bentall.  . SYNCOPE 02/18/2010   Qualifier: Diagnosis of  By: Lovena Le, MD, Martyn Malay   . Thoracic aortic aneurysm [facility])    a. 11/2009: s/p Magna Ease pericardial tissue valve size 73mm and replacement of fusiform ascending thoracic aneurysm with 30-mm Hemashield cath with hemiarch reconstruction.     Past Surgical History: Past Surgical History:  Procedure Laterality Date  . BACK SURGERY  1985  . CARPAL TUNNEL RELEASE     BIL WRISTS  . CHOLECYSTECTOMY  2010  . COLON SURGERY  2010   colon resection  . doppler carotid  2012  . DOPPLER ECHOCARDIOGRAPHY  2011  . PROSTATE SURGERY  2011   adenocarcinoma w/raddiation   . ROTATOR CUFF REPAIR     X2 LEFT SHOULDER  . THORACIC AORTIC ANEURYSM REPAIR  12/11/09   ASCENDING THORACIC AORTIC ANEURYSM REPAIR  . TISSUE AORTIC VALVE REPLACEMENT  12/11/09  . TOTAL HIP ARTHROPLASTY Left 04/17/2015   Procedure: LEFT TOTAL HIP ARTHROPLASTY ANTERIOR APPROACH;  Surgeon: Rod Can, MD;  Location: [facility];  Service: Orthopedics;  Laterality: Left;   11/2007 Op note by Dr. Jeanella Anton reviewed - sigmoid resection for recurrent diverticulitis  Family History: Family History  Problem Relation Age of Onset  . Stroke Mother 86  . Heart disease Father 55  . Heart attack Father   . Stomach cancer Neg Hx   . Colon cancer Neg Hx     Social History: Social History   Socioeconomic History  . Marital status: Married    Spouse name: Not on file  . Number of children:  Not on file  . Years of education: Not on file  . Highest education level: Not on file  Occupational History  . Not on file  Social Needs  . Financial resource strain: Not on file  . Food insecurity:    Worry: Not on file    Inability: Not on file  . Transportation needs:    Medical: Not on file    Non-medical: Not on file  Tobacco Use  . Smoking status: Former Smoker    Types: Cigarettes    Last attempt to quit: 03/16/1967    Years since quitting: 50.3  . Smokeless tobacco: Never Used  Substance and Sexual Activity  . Alcohol use: No  . Drug use: No  . Sexual activity: Not on file  Lifestyle  . Physical activity:    Days per week: Not on file    Minutes per session: Not on file  . Stress: Not on file  Relationships  . Social connections:    Talks on phone: Not on file    Gets together: Not on file    Attends religious service: Not on file    Active member of club or organization: Not on file    Attends meetings of clubs or organizations: Not on file    Relationship status: Not on file  Other Topics Concern  . Not on file  Social History Narrative  . Not on file    Allergies: Several medicines on file in chart  Outpatient Meds: Current Outpatient Medications  Medication Sig Dispense Refill  . azelastine (ASTELIN) 0.1 % nasal spray Place 1 spray into both nostrils 2 (two) times daily.  11  . hydrochlorothiazide (HYDRODIURIL) 12.5 MG tablet Take 25 mg by mouth daily.   3  . lansoprazole (PREVACID) 30 MG capsule Take 30 mg by mouth every morning.    Marland Kitchen losartan (COZAAR) 100 MG tablet Take 100 mg by mouth daily.  4  . simvastatin (ZOCOR) 20 MG tablet Take 20 mg by mouth daily.     . Tamsulosin HCl (FLOMAX PO) Take 0.4 mg by mouth every morning.      No current facility-administered medications for this visit.       ___________________________________________________________________ Objective   Exam:  BP 104/66   Pulse 64   Ht 6' (1.829 m)   Wt 256 lb  (116.1 kg)   BMI 34.72 kg/m    General: this is a(n) elderly man in no acute distress, uses a cane and gets on exam table slowly but without assistance.  Eyes: sclera anicteric, no redness  ENT: oral mucosa moist without lesions, no cervical or supraclavicular lymphadenopathy, good dentition  CV: RRR with soft systolic murmur, G3/T5, no JVD, no peripheral edema  Resp: clear to auscultation bilaterally, normal RR and effort noted  GI: soft, no tenderness, with active bowel sounds. No guarding or palpable organomegaly noted.  Skin; warm and dry, no rash or jaundice noted  Neuro: awake, alert and oriented x 3. Normal gross motor function and fluent speech   Radiologic Studies:  nuc stress 10/2016 LVEF 55% Last colonoscopy report as noted above  Assessment: Encounter Diagnoses  Name Primary?  . Special screening for malignant neoplasms, colon Yes  . Severe aortic stenosis s/p pericardial AVR, Bentall in 2011   . Thoracic aortic aneurysm without rupture ([place])     I agree he should have colorectal cancer screening, and he is at increased risk for sedation related complications of the colonoscopy.  I believe he could undergo the colonoscopy if necessary, but I think a Cologuard test would be a better for screening test for him.  We discussed the utility of this test and the possible implications of a positive or negative test.  If it is negative, current recommendations are that he be rescreened with the same test in 3 years.  If it is positive, he should undergo colonoscopy.  He is agreeable to doing that, and I believe that he could do so.  His risk would certainly be above average, but it would be a different risk-benefit analysis if he had a positive Cologuard.  Plan:  Cologuard study arranged, we will contact patient with results.  Total time 30 minutes including chart review of patient's extensive past medical history and over half spent in face-to-face consultation with  patient with issues described above. Thank you for the courtesy of this consult.  Please call me with any questions or concerns.  Nelida Meuse III  CC: [name], MD

## 2017-07-20 NOTE — Patient Instructions (Signed)
If you are age 74 or older, your body mass index should be between 23-30. Your Body mass index is 34.72 kg/m. If this is out of the aforementioned range listed, please consider follow up with your Primary Care Provider.  If you are age 59 or younger, your body mass index should be between 19-25. Your Body mass index is 34.72 kg/m. If this is out of the aformentioned range listed, please consider follow up with your Primary Care Provider.   Your provider has ordered Cologuard testing as an option for colon cancer screening. This is performed by [employer] and may be out of network with your insurance. PRIOR to completing the test, it is YOUR responsibility to contact your insurance about covered benefits for this test. Your out of pocket expense could be anywhere from $0.00 to $649.00.   When you call to check coverage with your insurer, please provide the following information:   -The ONLY provider of Cologuard is [place] code for Cologuard is [phone].  [employer] NPI # [id number]  -[employer] Tax ID # [id number]   We have already sent your demographic and insurance information to [employer] (phone number [phone]) and they should contact you within the next week regarding your test. If you have not heard from them within the next week, please call our office at [phone].  Thank you for choosing [facility]  Dr Wilfrid Lund III

## 2017-07-27 DIAGNOSIS — Z1211 Encounter for screening for malignant neoplasm of colon: Secondary | ICD-10-CM | POA: Diagnosis not present

## 2017-07-27 DIAGNOSIS — M1712 Unilateral primary osteoarthritis, left knee: Secondary | ICD-10-CM | POA: Diagnosis not present

## 2017-07-27 DIAGNOSIS — Z1212 Encounter for screening for malignant neoplasm of rectum: Secondary | ICD-10-CM | POA: Diagnosis not present

## 2017-08-03 DIAGNOSIS — Z6836 Body mass index (BMI) 36.0-36.9, adult: Secondary | ICD-10-CM | POA: Diagnosis not present

## 2017-08-03 DIAGNOSIS — M1712 Unilateral primary osteoarthritis, left knee: Secondary | ICD-10-CM | POA: Diagnosis not present

## 2017-08-10 DIAGNOSIS — M1712 Unilateral primary osteoarthritis, left knee: Secondary | ICD-10-CM | POA: Diagnosis not present

## 2017-08-10 DIAGNOSIS — Z6836 Body mass index (BMI) 36.0-36.9, adult: Secondary | ICD-10-CM | POA: Diagnosis not present

## 2017-08-10 DIAGNOSIS — W1849XA Other slipping, tripping and stumbling without falling, initial encounter: Secondary | ICD-10-CM | POA: Diagnosis not present

## 2017-08-11 ENCOUNTER — Telehealth: Payer: Self-pay | Admitting: Gastroenterology

## 2017-08-11 ENCOUNTER — Other Ambulatory Visit: Payer: Self-pay

## 2017-08-11 LAB — COLOGUARD: COLOGUARD: POSITIVE

## 2017-08-11 NOTE — Telephone Encounter (Signed)
Let [employer] know we received the cologard results.

## 2017-08-12 ENCOUNTER — Other Ambulatory Visit: Payer: Self-pay

## 2017-08-12 DIAGNOSIS — Z1211 Encounter for screening for malignant neoplasm of colon: Secondary | ICD-10-CM

## 2017-08-15 ENCOUNTER — Ambulatory Visit (AMBULATORY_SURGERY_CENTER): Payer: Self-pay | Admitting: *Deleted

## 2017-08-15 ENCOUNTER — Other Ambulatory Visit: Payer: Self-pay

## 2017-08-15 VITALS — Ht 72.0 in | Wt 257.0 lb

## 2017-08-15 DIAGNOSIS — R195 Other fecal abnormalities: Secondary | ICD-10-CM

## 2017-08-15 MED ORDER — MOVIPREP 100 G PO SOLR: 1.0000 | Freq: Once | ORAL | 0 refills | Status: AC

## 2017-08-15 MED ORDER — PEG 3350-KCL-NA BICARB-NACL 420 G PO SOLR: 4000.0000 mL | Freq: Once | ORAL | 0 refills | Status: DC

## 2017-08-15 NOTE — Progress Notes (Signed)
Wife with pt in pv today. Patient denies any allergies to eggs or soy. Patient denies any problems with anesthesia/sedation. Patient denies any oxygen use at home. Patient denies taking any diet/weight loss medications or blood thinners. EMMI education offered, pt declined. Moviprep instructions given to pt.

## 2017-08-18 DIAGNOSIS — R195 Other fecal abnormalities: Secondary | ICD-10-CM | POA: Diagnosis not present

## 2017-08-18 DIAGNOSIS — Z6836 Body mass index (BMI) 36.0-36.9, adult: Secondary | ICD-10-CM | POA: Diagnosis not present

## 2017-08-18 DIAGNOSIS — M1712 Unilateral primary osteoarthritis, left knee: Secondary | ICD-10-CM | POA: Diagnosis not present

## 2017-08-23 DIAGNOSIS — M1712 Unilateral primary osteoarthritis, left knee: Secondary | ICD-10-CM | POA: Diagnosis not present

## 2017-09-02 ENCOUNTER — Ambulatory Visit (HOSPITAL_COMMUNITY): Payer: Medicare Other | Admitting: Anesthesiology

## 2017-09-02 ENCOUNTER — Encounter (HOSPITAL_COMMUNITY): Payer: Self-pay

## 2017-09-02 ENCOUNTER — Other Ambulatory Visit: Payer: Self-pay

## 2017-09-02 ENCOUNTER — Encounter (HOSPITAL_COMMUNITY)
Admission: RE | Disposition: A | Discharge status: Home / Self Care | Payer: Self-pay | Source: Ambulatory Visit | Attending: Gastroenterology

## 2017-09-02 ENCOUNTER — Ambulatory Visit (HOSPITAL_COMMUNITY)
Admission: RE | Admit: 2017-09-02 | Discharge: 2017-09-02 | Disposition: A | Discharge status: Home / Self Care | Payer: Medicare Other | Source: Ambulatory Visit | Attending: Gastroenterology | Admitting: Gastroenterology

## 2017-09-02 DIAGNOSIS — Z91041 Radiographic dye allergy status: Secondary | ICD-10-CM | POA: Insufficient documentation

## 2017-09-02 DIAGNOSIS — M1990 Unspecified osteoarthritis, unspecified site: Secondary | ICD-10-CM | POA: Insufficient documentation

## 2017-09-02 DIAGNOSIS — Z923 Personal history of irradiation: Secondary | ICD-10-CM | POA: Insufficient documentation

## 2017-09-02 DIAGNOSIS — Z87891 Personal history of nicotine dependence: Secondary | ICD-10-CM | POA: Insufficient documentation

## 2017-09-02 DIAGNOSIS — Z888 Allergy status to other drugs, medicaments and biological substances status: Secondary | ICD-10-CM | POA: Diagnosis not present

## 2017-09-02 DIAGNOSIS — D123 Benign neoplasm of transverse colon: Secondary | ICD-10-CM | POA: Diagnosis not present

## 2017-09-02 DIAGNOSIS — N400 Enlarged prostate without lower urinary tract symptoms: Secondary | ICD-10-CM | POA: Insufficient documentation

## 2017-09-02 DIAGNOSIS — Z1211 Encounter for screening for malignant neoplasm of colon: Secondary | ICD-10-CM

## 2017-09-02 DIAGNOSIS — E785 Hyperlipidemia, unspecified: Secondary | ICD-10-CM | POA: Insufficient documentation

## 2017-09-02 DIAGNOSIS — Z952 Presence of prosthetic heart valve: Secondary | ICD-10-CM | POA: Diagnosis not present

## 2017-09-02 DIAGNOSIS — Z8719 Personal history of other diseases of the digestive system: Secondary | ICD-10-CM | POA: Diagnosis not present

## 2017-09-02 DIAGNOSIS — Z91013 Allergy to seafood: Secondary | ICD-10-CM | POA: Diagnosis not present

## 2017-09-02 DIAGNOSIS — D122 Benign neoplasm of ascending colon: Secondary | ICD-10-CM | POA: Insufficient documentation

## 2017-09-02 DIAGNOSIS — Z980 Intestinal bypass and anastomosis status: Secondary | ICD-10-CM | POA: Insufficient documentation

## 2017-09-02 DIAGNOSIS — K219 Gastro-esophageal reflux disease without esophagitis: Secondary | ICD-10-CM | POA: Diagnosis not present

## 2017-09-02 DIAGNOSIS — Z8546 Personal history of malignant neoplasm of prostate: Secondary | ICD-10-CM | POA: Insufficient documentation

## 2017-09-02 DIAGNOSIS — Z6832 Body mass index (BMI) 32.0-32.9, adult: Secondary | ICD-10-CM | POA: Diagnosis not present

## 2017-09-02 DIAGNOSIS — I10 Essential (primary) hypertension: Secondary | ICD-10-CM | POA: Diagnosis not present

## 2017-09-02 DIAGNOSIS — E669 Obesity, unspecified: Secondary | ICD-10-CM | POA: Insufficient documentation

## 2017-09-02 DIAGNOSIS — R195 Other fecal abnormalities: Secondary | ICD-10-CM

## 2017-09-02 HISTORY — PX: POLYPECTOMY: SHX5525

## 2017-09-02 HISTORY — PX: COLONOSCOPY WITH PROPOFOL: SHX5780

## 2017-09-02 SURGERY — COLONOSCOPY WITH PROPOFOL
Anesthesia: Monitor Anesthesia Care

## 2017-09-02 MED ORDER — PROPOFOL 500 MG/50ML IV EMUL
INTRAVENOUS | Status: DC | PRN
  Administered 2017-09-02: 100 ug/kg/min via INTRAVENOUS

## 2017-09-02 MED ORDER — PROPOFOL 10 MG/ML IV BOLUS
INTRAVENOUS | Status: AC
  Filled 2017-09-02: qty 40

## 2017-09-02 MED ORDER — SODIUM CHLORIDE 0.9 % IV SOLN: INTRAVENOUS | Status: DC

## 2017-09-02 MED ORDER — LIDOCAINE 2% (20 MG/ML) 5 ML SYRINGE
INTRAMUSCULAR | Status: DC | PRN
  Administered 2017-09-02: 100 mg via INTRAVENOUS

## 2017-09-02 MED ORDER — LACTATED RINGERS IV SOLN
INTRAVENOUS | Status: DC
  Administered 2017-09-02: 10:00:00 via INTRAVENOUS

## 2017-09-02 MED ORDER — PROPOFOL 10 MG/ML IV BOLUS
INTRAVENOUS | Status: DC | PRN
  Administered 2017-09-02: 50 mg via INTRAVENOUS

## 2017-09-02 SURGICAL SUPPLY — 22 items

## 2017-09-02 NOTE — Op Note (Signed)
[facility] Patient Name: Jeff Johnson Procedure Date: 09/02/2017 MRN: [id number] Attending MD: Estill Cotta. Loletha Carrow , MD Date of Birth: [birth year] CSN: [id number] Age: 72 Admit Type: Inpatient Procedure:                Colonoscopy Indications:              Positive Cologuard test Providers:                Mallie Mussel L. Loletha Carrow, MD, Baird Cancer, RN, Alan Mulder, Technician, Stephanie [place] CRNA Referring MD:             [name], MD Medicines:                Monitored Anesthesia Care Complications:            No immediate complications. Estimated Blood Loss:     Estimated blood loss was minimal. Procedure:                Pre-Anesthesia Assessment:                           - Prior to the procedure, a History and Physical                            was performed, and patient medications and                            allergies were reviewed. The patient's tolerance of                            previous anesthesia was also reviewed. The risks                            and benefits of the procedure and the sedation                            options and risks were discussed with the patient.                            All questions were answered, and informed consent                            was obtained. Anticoagulants: The patient has taken                            aspirin. It was decided not to withhold this                            medication prior to the procedure. ASA Grade                            Assessment: III - A patient with severe systemic  disease. After reviewing the risks and benefits,                            the patient was deemed in satisfactory condition to                            undergo the procedure.                           After obtaining informed consent, the colonoscope                            was passed under direct vision. Throughout the   procedure, the patient's blood pressure, pulse, and                            oxygen saturations were monitored continuously. The                            EC-[id number] ([id number]) scope was introduced through                            the anus and advanced to the the cecum, identified                            by appendiceal orifice and ileocecal valve. The                            colonoscopy was performed without difficulty. The                            patient tolerated the procedure well. The quality                            of the bowel preparation was good. The ileocecal                            valve, appendiceal orifice, and rectum were                            photographed. The quality of the bowel preparation                            was evaluated using the BBPS [facility] Bowel                            Preparation Scale) with scores of: Right Colon = 2,                            Transverse Colon = 2 and Left Colon = 2. The total                            BBPS score equals 6. Scope In: 10:05:00 AM Scope Out: 10:25:29 AM Scope  Withdrawal Time: 0 hours 17 minutes 13 seconds  Total Procedure Duration: 0 hours 20 minutes 29 seconds  Findings:      The perianal and digital rectal examinations were normal.      There was evidence of a prior end-to-end colo-colonic anastomosis in the       descending colon. This was patent and was characterized by healthy       appearing mucosa.      Five sessile polyps were found in the transverse colon and ascending       colon. The polyps were 4 to 6 mm in size. These polyps were removed with       a cold snare. Resection and retrieval were complete.      The exam was otherwise without abnormality on direct and retroflexion       views. Impression:               - Patent end-to-end colo-colonic anastomosis,                            characterized by healthy appearing mucosa.                           - Five 4 to 6 mm polyps in the  transverse colon and                            in the ascending colon, removed with a cold snare.                            Resected and retrieved.                           - The examination was otherwise normal on direct                            and retroflexion views. Moderate Sedation:      N/A- Per Anesthesia Care Recommendation:           - Patient has a contact number available for                            emergencies. The signs and symptoms of potential                            delayed complications were discussed with the                            patient. Return to normal activities tomorrow.                            Written discharge instructions were provided to the                            patient.                           - Resume previous diet.                           -  Continue present medications.                           - Await pathology results.                           - No repeat screening/surveillance colonoscopy due                            to age and medical condition. Procedure Code(s):        --- Professional ---                           [phone], Colonoscopy, flexible; with removal of                            tumor(s), polyp(s), or other lesion(s) by snare                            technique Diagnosis Code(s):        --- Professional ---                           Z98.0, Intestinal bypass and anastomosis status                           D12.3, Benign neoplasm of transverse colon (hepatic                            flexure or splenic flexure)                           D12.2, Benign neoplasm of ascending colon                           R19.5, Other fecal abnormalities CPT copyright 2017 American Medical Association. All rights reserved. The codes documented in this report are preliminary and upon coder review may  be revised to meet current compliance requirements. Jeff Johnson L. Loletha Carrow, MD 09/02/2017 10:35:44 AM This report has been signed  electronically. Number of Addenda: 0

## 2017-09-02 NOTE — H&P (Signed)
History:  This patient presents for endoscopic testing for positive cologuard.  Jeff Johnson Referring physician: [name], MD  Past Medical History: Past Medical History:  Diagnosis Date  . Arthritis   . Benign essential HTN 05/21/2014  . BPH (benign prostatic hyperplasia)   . Chronic cystitis    a. With ongoing hematuria.  . Contrast media allergy   . Diverticulitis    a. 2010: diverticulitis with stricture Sigmoid colectomy with mobilization of the splenic flexure within the end anastomosis.   . Dizziness and giddiness    a. H/o dizziness with reportedly negative tilt table. Holter 2012: NSR, sinus tachy, frequent PVCs, multifocal at times in a trigeminal fashion.  Marland Kitchen Dyspnea on exertion 03/18/2014  . Frequency of urination   . GERD (gastroesophageal reflux disease)   . H/O cardiac catheterization    a. 2011: minor luminal irregularities. b. Nuc 03/2014: ow risk without reversible ischemia or infarction, EF 57%. (There is septal akinesis but otherwise normal wall motion. This is nonspecific.)  . Headache    MIGRAINES  . Hematuria   . History of urinary retention   . Hyperlipidemia 03/18/2014  . Hyponatremia    a. Remote hx hyponatremia felt 2/2 SSRI.  Marland Kitchen Nocturia   . Obesity   . Orthostatic hypotension   . Prostate cancer ([place]) 2011   a. s/p radiation.  . Rash    LEFT KNEE   . 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 Severe aortic stenosis    a. 11/2009: pericardial AVR/[facility].  . SYNCOPE 02/18/2010   Qualifier: Diagnosis of  By: Lovena Le, MD, Martyn Malay   . Thoracic aortic aneurysm [facility])    a. 11/2009: s/p Magna Ease pericardial tissue valve size 83mm and replacement of fusiform ascending thoracic aneurysm with 30-mm Hemashield cath with hemiarch reconstruction.     Past Surgical History: Past Surgical History:  Procedure Laterality Date  . BACK SURGERY  1985  . CARPAL TUNNEL RELEASE     BIL WRISTS  . CHOLECYSTECTOMY  2010  . COLON SURGERY  2010    colon resection  . COLONOSCOPY  2008   [facility]  . doppler carotid  2012  . DOPPLER ECHOCARDIOGRAPHY  2011  . PROSTATE SURGERY  2011   adenocarcinoma w/raddiation   . ROTATOR CUFF REPAIR     X2 LEFT SHOULDER  . THORACIC AORTIC ANEURYSM REPAIR  12/11/09   ASCENDING THORACIC AORTIC ANEURYSM REPAIR  . TISSUE AORTIC VALVE REPLACEMENT  12/11/09  . TOTAL HIP ARTHROPLASTY Left 04/17/2015   Procedure: LEFT TOTAL HIP ARTHROPLASTY ANTERIOR APPROACH;  Surgeon: Rod Can, MD;  Location: [facility];  Service: Orthopedics;  Laterality: Left;    Allergies: List reviewed  Outpatient Meds: Current Facility-Administered Medications  Medication Dose Route Frequency Provider Last Rate Last Dose  . 0.9 %  sodium chloride infusion   Intravenous Continuous [name], Estill Cotta III, MD      . lactated ringers infusion   Intravenous Continuous [name], Estill Cotta III, MD          ___________________________________________________________________ Objective   Exam:  BP (!) 115/56   Pulse 60   Temp 98.4 F (36.9 C) (Oral)   Resp 15   Ht 6' (1.829 m)   Wt 240 lb (108.9 kg)   SpO2 97%   BMI 32.55 kg/m    CV: RRR without murmur, S1/S2, no JVD, no peripheral edema  Resp: clear to auscultation bilaterally, normal RR and effort noted  GI: soft, no tenderness, with  active bowel sounds. No guarding or palpable organomegaly noted.  Neuro: awake, alert and oriented x 3. Normal gross motor function and fluent speech   Assessment:  Positive cologuard    Plan:  Colonoscopy   Nelida Meuse III

## 2017-09-02 NOTE — Anesthesia Preprocedure Evaluation (Signed)
Anesthesia Evaluation  Patient identified by MRN, date of birth, ID band Patient awake    Reviewed: Allergy & Precautions, NPO status , Patient's Chart, lab work & pertinent test results  Airway Mallampati: I       Dental no notable dental hx. (+) Teeth Intact   Pulmonary former smoker,    Pulmonary exam normal breath sounds clear to auscultation       Cardiovascular hypertension, Pt. on medications Normal cardiovascular exam Rhythm:Regular Rate:Normal     Neuro/Psych    GI/Hepatic Neg liver ROS, GERD  Medicated,  Endo/Other  negative endocrine ROS  Renal/GU negative Renal ROS  negative genitourinary   Musculoskeletal   Abdominal (+) + obese,   Peds  Hematology negative hematology ROS (+)   Anesthesia Other Findings   Reproductive/Obstetrics                             Anesthesia Physical Anesthesia Plan  ASA: II  Anesthesia Plan: MAC   Post-op Pain Management:    Induction:   PONV Risk Score and Plan: 1  Airway Management Planned: Natural Airway and Nasal Cannula  Additional Equipment:   Intra-op Plan:   Post-operative Plan:   Informed Consent: I have reviewed the patients History and Physical, chart, labs and discussed the procedure including the risks, benefits and alternatives for the proposed anesthesia with the patient or authorized representative who has indicated his/her understanding and acceptance.     Plan Discussed with: CRNA  Anesthesia Plan Comments:         Anesthesia Quick Evaluation

## 2017-09-02 NOTE — Transfer of Care (Signed)
Immediate Anesthesia Transfer of Care Note  Patient: Jeff Johnson  Procedure(s) Performed: COLONOSCOPY WITH PROPOFOL (N/A ) POLYPECTOMY BIOPSY  Patient Location: PACU and Endoscopy Unit  Anesthesia Type:MAC  Level of Consciousness: awake, alert  and oriented  Airway & Oxygen Therapy: Patient Spontanous Breathing and Patient connected to face mask oxygen  Post-op Assessment: Report given to RN and Post -op Vital signs reviewed and stable  Post vital signs: Reviewed and stable  Last Vitals:  Vitals Value Taken Time  BP 106/66 09/02/2017 10:30 AM  Temp    Pulse 62 09/02/2017 10:31 AM  Resp 10 09/02/2017 10:31 AM  SpO2 96 % 09/02/2017 10:31 AM  Vitals shown include unvalidated device data.  Last Pain:  Vitals:   09/02/17 0935  TempSrc: Oral  PainSc: 0-No pain         Complications: No apparent anesthesia complications

## 2017-09-02 NOTE — Anesthesia Postprocedure Evaluation (Signed)
Anesthesia Post Note  Patient: Jeff Johnson  Procedure(s) Performed: COLONOSCOPY WITH PROPOFOL (N/A ) POLYPECTOMY BIOPSY     Patient location during evaluation: Endoscopy Anesthesia Type: MAC Level of consciousness: awake Pain management: pain level controlled Vital Signs Assessment: post-procedure vital signs reviewed and stable Respiratory status: spontaneous breathing Cardiovascular status: stable Postop Assessment: no apparent nausea or vomiting Anesthetic complications: no    Last Vitals:  Vitals:   09/02/17 1032 09/02/17 1040  BP: 106/66 118/68  Pulse: 62 62  Resp: 10 20  Temp: 36.7 C   SpO2: 96% 99%    Last Pain:  Vitals:   09/02/17 1040  TempSrc:   PainSc: 0-No pain   Pain Goal:                 Maleigha Colvard JR,JOHN Dalonte Hardage

## 2017-09-02 NOTE — Discharge Instructions (Signed)
YOU HAD AN ENDOSCOPIC PROCEDURE TODAY: Refer to the procedure report and other information in the discharge instructions given to you for any specific questions about what was found during the examination. If this information does not answer your questions, please call [place] office at [phone] to clarify.  ° °YOU SHOULD EXPECT: Some feelings of bloating in the abdomen. Passage of more gas than usual. Walking can help get rid of the air that was put into your GI tract during the procedure and reduce the bloating. If you had a lower endoscopy (such as a colonoscopy or flexible sigmoidoscopy) you may notice spotting of blood in your stool or on the toilet paper. Some abdominal soreness may be present for a day or two, also. ° °DIET: Your first meal following the procedure should be a light meal and then it is ok to progress to your normal diet. A half-sandwich or bowl of soup is an example of a good first meal. Heavy or fried foods are harder to digest and may make you feel nauseous or bloated. Drink plenty of fluids but you should avoid alcoholic beverages for 24 hours. If you had a esophageal dilation, please see attached instructions for diet.   ° °ACTIVITY: Your care partner should take you home directly after the procedure. You should plan to take it easy, moving slowly for the rest of the day. You can resume normal activity the day after the procedure however YOU SHOULD NOT DRIVE, use power tools, machinery or perform tasks that involve climbing or major physical exertion for 24 hours (because of the sedation medicines used during the test).  ° °SYMPTOMS TO REPORT IMMEDIATELY: °A gastroenterologist can be reached at any hour. Please call [phone]  for any of the following symptoms:  °Following lower endoscopy (colonoscopy, flexible sigmoidoscopy) °Excessive amounts of blood in the stool  °Significant tenderness, worsening of abdominal pains  °Swelling of the abdomen that is new, acute  °Fever of 100° or  higher  °Following upper endoscopy (EGD, EUS, ERCP, esophageal dilation) °Vomiting of blood or coffee ground material  °New, significant abdominal pain  °New, significant chest pain or pain under the shoulder blades  °Painful or persistently difficult swallowing  °New shortness of breath  °Black, tarry-looking or red, bloody stools ° °FOLLOW UP:  °If any biopsies were taken you will be contacted by phone or by letter within the next 1-3 weeks. Call [phone]  if you have not heard about the biopsies in 3 weeks.  °Please also call with any specific questions about appointments or follow up tests. ° °

## 2017-09-02 NOTE — Interval H&P Note (Signed)
History and Physical Interval Note:  09/02/2017 9:53 AM  Jeff Johnson  has presented today for surgery, with the diagnosis of positive colonoscopy  The various methods of treatment have been discussed with the patient and family. After consideration of risks, benefits and other options for treatment, the patient has consented to  Procedure(s): COLONOSCOPY WITH PROPOFOL (N/A) as a surgical intervention .  The patient's history has been reviewed, patient examined, no change in status, stable for surgery.  I have reviewed the patient's chart and labs.  Questions were answered to the patient's satisfaction.     Nelida Meuse III

## 2017-09-05 ENCOUNTER — Encounter (HOSPITAL_COMMUNITY): Payer: Self-pay | Admitting: Gastroenterology

## 2017-09-12 ENCOUNTER — Encounter: Payer: Self-pay | Admitting: Gastroenterology

## 2017-09-30 ENCOUNTER — Ambulatory Visit: Payer: Medicare Other | Admitting: Cardiovascular Disease

## 2017-10-04 DIAGNOSIS — E559 Vitamin D deficiency, unspecified: Secondary | ICD-10-CM | POA: Diagnosis not present

## 2017-10-04 DIAGNOSIS — E782 Mixed hyperlipidemia: Secondary | ICD-10-CM | POA: Diagnosis not present

## 2017-10-06 DIAGNOSIS — Z6837 Body mass index (BMI) 37.0-37.9, adult: Secondary | ICD-10-CM | POA: Diagnosis not present

## 2017-10-06 DIAGNOSIS — E782 Mixed hyperlipidemia: Secondary | ICD-10-CM | POA: Diagnosis not present

## 2017-10-06 DIAGNOSIS — I10 Essential (primary) hypertension: Secondary | ICD-10-CM | POA: Diagnosis not present

## 2017-10-06 DIAGNOSIS — E559 Vitamin D deficiency, unspecified: Secondary | ICD-10-CM | POA: Diagnosis not present

## 2017-10-27 DIAGNOSIS — H52223 Regular astigmatism, bilateral: Secondary | ICD-10-CM | POA: Diagnosis not present

## 2017-10-27 DIAGNOSIS — H5203 Hypermetropia, bilateral: Secondary | ICD-10-CM | POA: Diagnosis not present

## 2017-10-27 DIAGNOSIS — H25813 Combined forms of age-related cataract, bilateral: Secondary | ICD-10-CM | POA: Diagnosis not present

## 2017-10-27 DIAGNOSIS — H524 Presbyopia: Secondary | ICD-10-CM | POA: Diagnosis not present

## 2017-10-28 ENCOUNTER — Encounter (INDEPENDENT_AMBULATORY_CARE_PROVIDER_SITE_OTHER): Payer: Self-pay | Admitting: Orthopaedic Surgery

## 2017-10-28 ENCOUNTER — Ambulatory Visit (INDEPENDENT_AMBULATORY_CARE_PROVIDER_SITE_OTHER): Payer: Medicare Other | Admitting: Orthopaedic Surgery

## 2017-10-28 DIAGNOSIS — M65341 Trigger finger, right ring finger: Secondary | ICD-10-CM

## 2017-10-28 MED ORDER — BUPIVACAINE HCL 0.5 % IJ SOLN
0.3300 mL | INTRAMUSCULAR | Status: AC | PRN
  Administered 2017-10-28: .33 mL

## 2017-10-28 MED ORDER — LIDOCAINE HCL 1 % IJ SOLN
0.3000 mL | INTRAMUSCULAR | Status: AC | PRN
  Administered 2017-10-28: .3 mL

## 2017-10-28 MED ORDER — METHYLPREDNISOLONE ACETATE 40 MG/ML IJ SUSP
13.3300 mg | INTRAMUSCULAR | Status: AC | PRN
  Administered 2017-10-28: 13.33 mg

## 2017-10-28 NOTE — Progress Notes (Signed)
Office Visit Note   Patient: Jeff Johnson           Date of Birth: 1945/12/13           MRN: [id number] Visit Date: 10/28/2017              Requested by: Jeff Johnson, [address] [place], [place] [address] PCP: Jeff Bien, MD   Assessment & Plan: Visit Diagnoses:  1. Trigger finger, right ring finger     Plan: Impression is 72 year old gentleman with right ring trigger finger.  Injection performed today into the A1 pulley patient tolerated this well.  Follow-up as needed.  Follow-Up Instructions: Return if symptoms worsen or fail to improve.   Orders:  No orders of the defined types were placed in this encounter.  No orders of the defined types were placed in this encounter.     Procedures: Hand/UE Inj: R ring A1 for trigger finger on 10/28/2017 8:15 AM Indications: pain Details: 25 G needle Medications: 0.3 mL lidocaine 1 %; 0.33 mL bupivacaine 0.5 %; 13.33 mg methylPREDNISolone acetate 40 MG/ML Outcome: tolerated well, no immediate complications Consent was given by the patient. Patient was prepped and draped in the usual sterile fashion.       Clinical Data: No additional findings.   Subjective: Chief Complaint  Patient presents with  . Right Hand - Pain    Jeff Johnson is a very pleasant 72 year old gentleman who comes in with a right ring trigger finger.  He denies any injuries.  He endorses persistent locking and catching with pain and difficulty with making a grip.   Review of Systems  Constitutional: Negative.   All other systems reviewed and are negative.    Objective: Vital Signs: There were no vitals taken for this visit.  Physical Exam  Constitutional: He is oriented to person, place, and time. He appears well-developed and well-nourished.  Pulmonary/Chest: Effort normal.  Abdominal: Soft.  Neurological: He is alert and oriented to person, place, and time.  Skin: Skin is warm.  Psychiatric: He has a normal mood and  affect. His behavior is normal. Judgment and thought content normal.  Nursing note and vitals reviewed.   Ortho Exam Right hand exam shows a tender A1 pulley with significant catching and triggering. Specialty Comments:  No specialty comments available.  Imaging: No results found.   PMFS History: Patient Active Problem List   Diagnosis Date Noted  . Positive colorectal cancer screening using Cologuard test   . Benign neoplasm of ascending colon   . Benign neoplasm of transverse colon   . Pain of left hip joint 04/22/2017  . History of revision of total replacement of left hip joint 04/09/2017  . History of left hip replacement 04/08/2017  . Osteoarthritis 04/06/2017  . Arthritis of right hand 02/07/2017  . Pain in right hand 02/07/2017  . 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 Essential hypertension 05/21/2014  . Orthostatic hypotension   . Elevated BP   . BPH (benign prostatic hyperplasia)   . Diverticulitis   . Hyponatremia   . H/O cardiac catheterization   . Hematuria   . Severe aortic stenosis s/p pericardial AVR, [facility] in 2011   . Contrast media allergy   . Obesity   . Prostate cancer ([place])   . Chronic cystitis   . 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 Dyspnea on exertion 03/18/2014  . Hyperlipidemia 03/18/2014  . GERD (gastroesophageal reflux disease)  03/18/2014  . Aneurysm of aorta ([place]) 07/03/2012  . History of prostate cancer 07/03/2012  . Shoulder pain 05/02/2012  . Hematest positive stools 02/03/2012  . History of hypertension 05/18/2010  . SYNCOPE 02/18/2010  . ORTHOSTATIC DIZZINESS 02/18/2010  . Allergic rhinitis 02/16/2010  . Depression 01/30/2010  . History of anemia 01/30/2010  . Cardiomegaly 10/15/2009  . Vitamin D deficiency 09/03/2009  . Hammer toe 12/16/2008  . Osteoarthritis 12/03/2008  . Hearing loss 06/20/2008   Past Medical History:  Diagnosis Date  . Arthritis   . Benign essential HTN 05/21/2014  . BPH  (benign prostatic hyperplasia)   . Chronic cystitis    a. With ongoing hematuria.  . Contrast media allergy   . Diverticulitis    a. 2010: diverticulitis with stricture Sigmoid colectomy with mobilization of the splenic flexure within the end anastomosis.   . Dizziness and giddiness    a. H/o dizziness with reportedly negative tilt table. Holter 2012: NSR, sinus tachy, frequent PVCs, multifocal at times in a trigeminal fashion.  Marland Kitchen Dyspnea on exertion 03/18/2014  . Frequency of urination   . GERD (gastroesophageal reflux disease)   . H/O cardiac catheterization    a. 2011: minor luminal irregularities. b. Nuc 03/2014: ow risk without reversible ischemia or infarction, EF 57%. (There is septal akinesis but otherwise normal wall motion. This is nonspecific.)  . Headache    MIGRAINES  . Hematuria   . History of urinary retention   . Hyperlipidemia 03/18/2014  . Hyponatremia    a. Remote hx hyponatremia felt 2/2 SSRI.  Marland Kitchen Nocturia   . Obesity   . Orthostatic hypotension   . Prostate cancer ([place]) 2011   a. s/p radiation.  . Rash    LEFT KNEE   . 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 Severe aortic stenosis    a. 11/2009: pericardial AVR/[facility].  . SYNCOPE 02/18/2010   Qualifier: Diagnosis of  By: Jeff Le, MD, Jeff Johnson   . Thoracic aortic aneurysm [facility])    a. 11/2009: s/p Magna Ease pericardial tissue valve size 29mm and replacement of fusiform ascending thoracic aneurysm with 30-mm Hemashield cath with hemiarch reconstruction.    Family History  Problem Relation Age of Onset  . Stroke Mother 61  . Heart disease Father 18  . Heart attack Father   . Colon cancer Maternal Aunt   . Stomach cancer Neg Hx   . Esophageal cancer Neg Hx     Past Surgical History:  Procedure Laterality Date  . BACK SURGERY  1985  . CARPAL TUNNEL RELEASE     BIL WRISTS  . CHOLECYSTECTOMY  2010  . COLON SURGERY  2010   colon resection  . COLONOSCOPY  2008   [facility]  . COLONOSCOPY WITH  PROPOFOL N/A 09/02/2017   Procedure: COLONOSCOPY WITH PROPOFOL;  Surgeon: Jeff Stabler, MD;  Location: [facility];  Service: Gastroenterology;  Laterality: N/A;  . doppler carotid  2012  . DOPPLER ECHOCARDIOGRAPHY  2011  . POLYPECTOMY  09/02/2017   Procedure: POLYPECTOMY;  Surgeon: Jeff Stabler, MD;  Location: [facility];  Service: Gastroenterology;;  . PROSTATE SURGERY  2011   adenocarcinoma w/raddiation   . ROTATOR CUFF REPAIR     X2 LEFT SHOULDER  . THORACIC AORTIC ANEURYSM REPAIR  12/11/09   ASCENDING THORACIC AORTIC ANEURYSM REPAIR  . TISSUE AORTIC VALVE REPLACEMENT  12/11/09  . TOTAL HIP ARTHROPLASTY Left 04/17/2015   Procedure: LEFT TOTAL HIP ARTHROPLASTY ANTERIOR APPROACH;  Surgeon:  Rod Can, MD;  Location: [facility];  Service: Orthopedics;  Laterality: Left;   Social History   Occupational History  . Not on file  Tobacco Use  . Smoking status: Former Smoker    Types: Cigarettes    Last attempt to quit: 03/16/1967    Years since quitting: 50.6  . Smokeless tobacco: Never Used  Substance and Sexual Activity  . Alcohol use: No  . Drug use: No  . Sexual activity: Not on file

## 2017-12-06 DIAGNOSIS — Z23 Encounter for immunization: Secondary | ICD-10-CM | POA: Diagnosis not present

## 2017-12-19 ENCOUNTER — Encounter: Payer: Self-pay | Admitting: Cardiovascular Disease

## 2017-12-19 ENCOUNTER — Ambulatory Visit (INDEPENDENT_AMBULATORY_CARE_PROVIDER_SITE_OTHER): Payer: Medicare Other | Admitting: Cardiovascular Disease

## 2017-12-19 VITALS — BP 110/58 | HR 64 | Ht 72.0 in | Wt 262.0 lb

## 2017-12-19 DIAGNOSIS — Z952 Presence of prosthetic heart valve: Secondary | ICD-10-CM

## 2017-12-19 DIAGNOSIS — I10 Essential (primary) hypertension: Secondary | ICD-10-CM

## 2017-12-19 NOTE — Progress Notes (Signed)
Cardiology Office Note   Date:  12/19/2017   ID:  Jeff Johnson, Jeff Johnson 07-31-1945, MRN [id number]  PCP:  [name], MD  Cardiologist:   [name], MD   Chief Complaint  Patient presents with  . Hypertension   1. Aortic stenosis/ aortic insufficiency-status post Bentall procedure- 2011 2. History of chronic cystitis 3. Hyperlipidemia 4. Dizziness - ? orthostasis. Negative tilt table study. 30 day monitor negative. Occurs with standing or sitting, did not improve wit Midodrine     72 yo with hx of aortic valve disease - s/p Bentall procedure.   He's been having some problems with dizziness. He has seen Dr. Crissie Sickles. He's had a 30 day event monitor which was negative. He had a tilt table study which was negative. He has had episodes of dizziness while standing and with sitting. He usually has several seconds of warning. If he is driving his car he was able to pull over to the side of the road. He has tried Midodrine but does not tolerate it very well. It causes him to be anxious and also causes urinary retention. He also has been eating lots of salt and that helped some.  He complains of generalized fatigue. He does not sleep very well. He has had prostate cancer and has to go to the bathroom at least 3 times a night.  Nov. 6, 2014:   Jeff Johnson is doing well. He is working part time which involves standing and walking for 8 hours. ( [employer]) No CP, no dyspnea.   May 21, 2014:  Jeff Johnson is a 72 y.o. male who presents for follow up of his Bentall procedure in 2011.   Has recorded his BP.   Readings are mostly ok.  No CP , no dyspnea  Dec. 5, 2016:  Doing ok from a cardiac standpoint.  Having some hip pain . Needs to have cardiac clearance.  Following up with Dr. Prescott Gum for an aortic aneurism.   August 30, 2016:  Jeff Johnson is seen back for follow up  Was last seen 1.5 years ago Has been having lots of leg ( hip and knee problems )  He  is s/p Bentall procedure in 2011.   Has some dizziness when he stands up  Has shortness of breath when he bends over.  Has had some leg swelling and Dr. Ernie Hew started HCTZ 25 mg a day   Oct. 7, 2019:  Doing well. Not exercising much.   Has knee problems .  Has severe pain in left knee  No CP . Has DOE walking up stairs.   BP is low / normal   Past Medical History:  Diagnosis Date  . Arthritis   . Benign essential HTN 05/21/2014  . BPH (benign prostatic hyperplasia)   . Chronic cystitis    a. With ongoing hematuria.  . Contrast media allergy   . Diverticulitis    a. 2010: diverticulitis with stricture Sigmoid colectomy with mobilization of the splenic flexure within the end anastomosis.   . Dizziness and giddiness    a. H/o dizziness with reportedly negative tilt table. Holter 2012: NSR, sinus tachy, frequent PVCs, multifocal at times in a trigeminal fashion.  Marland Kitchen Dyspnea on exertion 03/18/2014  . Frequency of urination   . GERD (gastroesophageal reflux disease)   . H/O cardiac catheterization    a. 2011: minor luminal irregularities. b. Nuc 03/2014: ow risk without reversible ischemia or infarction, EF 57%. (There is septal akinesis  but otherwise normal wall motion. This is nonspecific.)  . Headache    MIGRAINES  . Hematuria   . History of urinary retention   . Hyperlipidemia 03/18/2014  . Hyponatremia    a. Remote hx hyponatremia felt 2/2 SSRI.  Marland Kitchen Nocturia   . Obesity   . Orthostatic hypotension   . Prostate cancer ([place]) 2011   a. s/p radiation.  . Rash    LEFT KNEE   . 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 Severe aortic stenosis    a. 11/2009: pericardial AVR/Bentall.  . SYNCOPE 02/18/2010   Qualifier: Diagnosis of  By: Lovena Le, MD, Martyn Malay   . Thoracic aortic aneurysm [facility])    a. 11/2009: s/p Magna Ease pericardial tissue valve size 43mm and replacement of fusiform ascending thoracic aneurysm with 30-mm Hemashield cath with hemiarch reconstruction.     Past Surgical History:  Procedure Laterality Date  . BACK SURGERY  1985  . CARPAL TUNNEL RELEASE     BIL WRISTS  . CHOLECYSTECTOMY  2010  . COLON SURGERY  2010   colon resection  . COLONOSCOPY  2008   [facility]  . COLONOSCOPY WITH PROPOFOL N/A 09/02/2017   Procedure: COLONOSCOPY WITH PROPOFOL;  Surgeon: Doran Stabler, MD;  Location: [facility];  Service: Gastroenterology;  Laterality: N/A;  . doppler carotid  2012  . DOPPLER ECHOCARDIOGRAPHY  2011  . POLYPECTOMY  09/02/2017   Procedure: POLYPECTOMY;  Surgeon: Doran Stabler, MD;  Location: [facility];  Service: Gastroenterology;;  . PROSTATE SURGERY  2011   adenocarcinoma w/raddiation   . ROTATOR CUFF REPAIR     X2 LEFT SHOULDER  . THORACIC AORTIC ANEURYSM REPAIR  12/11/09   ASCENDING THORACIC AORTIC ANEURYSM REPAIR  . TISSUE AORTIC VALVE REPLACEMENT  12/11/09  . TOTAL HIP ARTHROPLASTY Left 04/17/2015   Procedure: LEFT TOTAL HIP ARTHROPLASTY ANTERIOR APPROACH;  Surgeon: Rod Can, MD;  Location: [facility];  Service: Orthopedics;  Laterality: Left;     Current Outpatient Medications  Medication Sig Dispense Refill  . acetaminophen (TYLENOL) 650 MG CR tablet Take 1,300 mg by mouth daily.     . ASPIRIN 81 PO Take 1 tablet by mouth daily.    . Cholecalciferol (VITAMIN D PO) Take 1 tablet by mouth daily.    . hydrochlorothiazide (HYDRODIURIL) 25 MG tablet Take 25 mg by mouth daily.    . lansoprazole (PREVACID) 15 MG capsule Take 15 mg by mouth daily at 12 noon.    Marland Kitchen losartan (COZAAR) 100 MG tablet Take 100 mg by mouth daily.  4  . montelukast (SINGULAIR) 10 MG tablet Take 10 mg by mouth daily.    . simvastatin (ZOCOR) 20 MG tablet Take 20 mg by mouth daily.     . tamsulosin (FLOMAX) 0.4 MG CAPS capsule Take 0.4 mg by mouth daily.  6   No current facility-administered medications for this visit.     Allergies:   Beta adrenergic blockers; Iodinated diagnostic agents; Shellfish allergy; and Shellfish-derived products     Social History:  The patient  reports that he quit smoking about 50 years ago. His smoking use included cigarettes. He has never used smokeless tobacco. He reports that he does not drink alcohol or use drugs.   Family History:  The patient's family history includes Colon cancer in his maternal aunt; Heart attack in his father; Heart disease (age of onset: 80) in his father; Stroke (age of onset: 57) in his mother.    ROS:  Please see the history of present illness.      PHYSICAL EXAM: VS:  BP (!) 110/58   Pulse 64   Ht 6' (1.829 m)   Wt 262 lb (118.8 kg)   SpO2 97%   BMI 35.53 kg/m  , BMI Body mass index is 35.53 kg/m.   GEN:  Obese, middle age man.  NAD   HEENT: normal  Neck: no JVD, carotid bruits, or masses Cardiac: RRR; no murmurs, rubs, or gallops,no edema  Respiratory:  clear to auscultation bilaterally, normal work of breathing GI: soft, nontender, nondistended, + BS MS: no deformity or atrophy  Skin: warm and dry, no rash Neuro:  Strength and sensation are intact Psych: normal   EKG:     Recent Labs: No results found for requested labs within last 8760 hours.    Lipid Panel    Component Value Date/Time   CHOL 129 02/17/2015 1223   TRIG 163 (H) 02/17/2015 1223   HDL 31 (L) 02/17/2015 1223   CHOLHDL 4.2 02/17/2015 1223   VLDL 33 (H) 02/17/2015 1223   LDLCALC 65 02/17/2015 1223      Wt Readings from Last 3 Encounters:  12/19/17 262 lb (118.8 kg)  09/02/17 240 lb (108.9 kg)  08/15/17 257 lb (116.6 kg)      Other studies Reviewed: Additional studies/ records that were reviewed today include: . Review of the above records demonstrates:    ASSESSMENT AND PLAN:  1. Aortic stenosis/ aortic insufficiency-status post Bentall procedure- 2011, followed by Dr. Prescott Gum.  Doing well  stabel    2. History of chronic cystitis - managed by his primary MD   3. Hyperlipidemia - managed by Dr. Ernie Hew.   Requested labs to be sent to [place]  On Simvastatin    4. Dizziness -  BP is good today   5.   Essential Hypertension:    BP is actually low today , no syncope  6. Dyspnea:      7.   Left knee pain :    May need to have left knee surgery.  He is at low risk for surgery if he decides to do it   The following changes have been made:  no change   Disposition:   FU with me in 1 year.     Signed, [name], MD  12/19/2017 9:16 AM    [facility] [place], [place], [place]  [address] Phone: [phone]; Fax: [phone]

## 2017-12-19 NOTE — Patient Instructions (Signed)
Medication Instructions:  Your physician recommends that you continue on your current medications as directed. Please refer to the Current Medication list given to you today.  If you need a refill on your cardiac medications before your next appointment, please call your pharmacy.   Lab work: None Ordered  If you have labs (blood work) drawn today and your tests are completely normal, you will receive your results only by: Marland Kitchen MyChart Message (if you have MyChart) OR . A paper copy in the mail If you have any lab test that is abnormal or we need to change your treatment, we will call you to review the results.   Testing/Procedures: None Ordered  Follow-Up: At [facility], you and your health needs are our priority.  As part of our continuing mission to provide you with exceptional heart care, we have created designated Provider Care Teams.  These Care Teams include your primary Cardiologist (physician) and Advanced Practice Providers (APPs -  Physician Assistants and Nurse Practitioners) who all work together to provide you with the care you need, when you need it. You will need a follow up appointment in:  12 months.  Please call our office 2 months in advance to schedule this appointment.  You may see [name], MD or one of the following Advanced Practice Providers on your designated Care Team: Richardson Dopp, PA-C [place], [place] . Daune Perch, NP

## 2018-01-06 DIAGNOSIS — Z6837 Body mass index (BMI) 37.0-37.9, adult: Secondary | ICD-10-CM | POA: Diagnosis not present

## 2018-01-06 DIAGNOSIS — E782 Mixed hyperlipidemia: Secondary | ICD-10-CM | POA: Diagnosis not present

## 2018-01-06 DIAGNOSIS — I10 Essential (primary) hypertension: Secondary | ICD-10-CM | POA: Diagnosis not present

## 2018-01-06 DIAGNOSIS — M1712 Unilateral primary osteoarthritis, left knee: Secondary | ICD-10-CM | POA: Diagnosis not present

## 2018-01-06 DIAGNOSIS — M6530 Trigger finger, unspecified finger: Secondary | ICD-10-CM | POA: Diagnosis not present

## 2018-01-14 ENCOUNTER — Emergency Department (HOSPITAL_COMMUNITY): Payer: Medicare Other

## 2018-01-14 ENCOUNTER — Emergency Department (HOSPITAL_COMMUNITY)
Admission: EM | Admit: 2018-01-14 | Discharge: 2018-01-14 | Disposition: A | Discharge status: Home / Self Care | Payer: Medicare Other | Attending: Emergency Medicine | Admitting: Emergency Medicine

## 2018-01-14 ENCOUNTER — Other Ambulatory Visit: Payer: Self-pay

## 2018-01-14 ENCOUNTER — Encounter (HOSPITAL_COMMUNITY): Payer: Self-pay | Admitting: Emergency Medicine

## 2018-01-14 DIAGNOSIS — Z79899 Other long term (current) drug therapy: Secondary | ICD-10-CM | POA: Diagnosis not present

## 2018-01-14 DIAGNOSIS — I10 Essential (primary) hypertension: Secondary | ICD-10-CM | POA: Insufficient documentation

## 2018-01-14 DIAGNOSIS — Z87891 Personal history of nicotine dependence: Secondary | ICD-10-CM | POA: Insufficient documentation

## 2018-01-14 DIAGNOSIS — G4459 Other complicated headache syndrome: Secondary | ICD-10-CM

## 2018-01-14 DIAGNOSIS — H53122 Transient visual loss, left eye: Secondary | ICD-10-CM | POA: Diagnosis not present

## 2018-01-14 DIAGNOSIS — R51 Headache: Secondary | ICD-10-CM | POA: Diagnosis not present

## 2018-01-14 DIAGNOSIS — H538 Other visual disturbances: Secondary | ICD-10-CM | POA: Diagnosis not present

## 2018-01-14 LAB — CBC WITH DIFFERENTIAL/PLATELET
Abs Immature Granulocytes: 0.01 10*3/uL (ref 0.00–0.07)
Basophils Absolute: 0.1 10*3/uL (ref 0.0–0.1)
Basophils Relative: 1 %
Eosinophils Absolute: 0.2 10*3/uL (ref 0.0–0.5)
Eosinophils Relative: 3 %
HCT: 39.9 % (ref 39.0–52.0)
Hemoglobin: 13.4 g/dL (ref 13.0–17.0)
Immature Granulocytes: 0 %
Lymphocytes Relative: 35 %
Lymphs Abs: 2 10*3/uL (ref 0.7–4.0)
MCH: 30.1 pg (ref 26.0–34.0)
MCHC: 33.6 g/dL (ref 30.0–36.0)
MCV: 89.7 fL (ref 80.0–100.0)
Monocytes Absolute: 0.6 10*3/uL (ref 0.1–1.0)
Monocytes Relative: 9 %
Neutro Abs: 3 10*3/uL (ref 1.7–7.7)
Neutrophils Relative %: 52 %
Platelets: 156 10*3/uL (ref 150–400)
RBC: 4.45 MIL/uL (ref 4.22–5.81)
RDW: 12.7 % (ref 11.5–15.5)
WBC: 5.8 10*3/uL (ref 4.0–10.5)
nRBC: 0 % (ref 0.0–0.2)

## 2018-01-14 LAB — BASIC METABOLIC PANEL
Anion gap: 10 (ref 5–15)
BUN: 18 mg/dL (ref 8–23)
CO2: 25 mmol/L (ref 22–32)
Calcium: 8.9 mg/dL (ref 8.9–10.3)
Chloride: 104 mmol/L (ref 98–111)
Creatinine, Ser: 1.28 mg/dL — ABNORMAL HIGH (ref 0.61–1.24)
GFR calc Af Amer: >60 mL/min (ref >60)
GFR calc non Af Amer: 53 mL/min — ABNORMAL LOW (ref >60)
Glucose, Bld: 100 mg/dL — ABNORMAL HIGH (ref 70–99)
Potassium: 3.7 mmol/L (ref 3.5–5.1)
Sodium: 139 mmol/L (ref 135–145)

## 2018-01-14 LAB — URINALYSIS, ROUTINE W REFLEX MICROSCOPIC
Bilirubin Urine: NEGATIVE
Glucose, UA: NEGATIVE mg/dL
Hgb urine dipstick: NEGATIVE
Ketones, ur: NEGATIVE mg/dL
Leukocytes, UA: NEGATIVE
Nitrite: NEGATIVE
Protein, ur: NEGATIVE mg/dL
Specific Gravity, Urine: 1.01 (ref 1.005–1.030)
pH: 5 (ref 5.0–8.0)

## 2018-01-14 MED ORDER — SODIUM CHLORIDE 0.9 % IV BOLUS
500.0000 mL | Freq: Once | INTRAVENOUS | Status: AC
  Administered 2018-01-14: 500 mL via INTRAVENOUS

## 2018-01-14 NOTE — ED Notes (Signed)
Patient transported to MRI 

## 2018-01-14 NOTE — ED Provider Notes (Signed)
[facility] DEPT Provider Note   CSN: [id number] Arrival date & time: 01/14/18  1348     History   Chief Complaint Chief Complaint  Patient presents with  . temporary loss of vision    HPI Jeff Johnson is a 72 y.o. male.  HPI Patient presents to the emergency department with loss of most of his vision in the left eye for about 2 to 3 minutes.  The patient states that he then developed headache as his vision was returning.  Patient states that he has had no other difficulties aside.  The patient states that his headache has gotten better as he has been laying down.  The patient states that nothing seemed to make the condition better or worse while it was ongoing.  Patient states he has a long history of migraine headaches but this does not feel similar to those.  It is not as severe as his previous migraine headaches.  The headache does seem to center around the left eye.  The patient denies chest pain, shortness of breath,blurred vision, neck pain, fever, cough, weakness, numbness, dizziness, anorexia, edema, abdominal pain, nausea, vomiting, diarrhea, rash, back pain, dysuria, hematemesis, bloody stool, near syncope, or syncope. Past Medical History:  Diagnosis Date  . Arthritis   . Benign essential HTN 05/21/2014  . BPH (benign prostatic hyperplasia)   . Chronic cystitis    a. With ongoing hematuria.  . Contrast media allergy   . Diverticulitis    a. 2010: diverticulitis with stricture Sigmoid colectomy with mobilization of the splenic flexure within the end anastomosis.   . Dizziness and giddiness    a. H/o dizziness with reportedly negative tilt table. Holter 2012: NSR, sinus tachy, frequent PVCs, multifocal at times in a trigeminal fashion.  Marland Kitchen Dyspnea on exertion 03/18/2014  . Frequency of urination   . GERD (gastroesophageal reflux disease)   . H/O cardiac catheterization    a. 2011: minor luminal irregularities. b. Nuc 03/2014: ow risk without  reversible ischemia or infarction, EF 57%. (There is septal akinesis but otherwise normal wall motion. This is nonspecific.)  . Headache    MIGRAINES  . Hematuria   . History of urinary retention   . Hyperlipidemia 03/18/2014  . Hyponatremia    a. Remote hx hyponatremia felt 2/2 SSRI.  Marland Kitchen Nocturia   . Obesity   . Orthostatic hypotension   . Prostate cancer ([place]) 2011   a. s/p radiation.  . Rash    LEFT KNEE   . 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 Severe aortic stenosis    a. 11/2009: pericardial AVR/Bentall.  . SYNCOPE 02/18/2010   Qualifier: Diagnosis of  By: Lovena Le, MD, Martyn Malay   . Thoracic aortic aneurysm [facility])    a. 11/2009: s/p Magna Ease pericardial tissue valve size 40mm and replacement of fusiform ascending thoracic aneurysm with 30-mm Hemashield cath with hemiarch reconstruction.    Patient Active Problem List   Diagnosis Date Noted  . Positive colorectal cancer screening using Cologuard test   . Benign neoplasm of ascending colon   . Benign neoplasm of transverse colon   . Pain of left hip joint 04/22/2017  . History of revision of total replacement of left hip joint 04/09/2017  . History of left hip replacement 04/08/2017  . Osteoarthritis 04/06/2017  . Arthritis of right hand 02/07/2017  . Pain in right hand 02/07/2017  . 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 Essential hypertension 05/21/2014  .  Orthostatic hypotension   . Elevated BP   . BPH (benign prostatic hyperplasia)   . Diverticulitis   . Hyponatremia   . H/O cardiac catheterization   . Hematuria   . Severe aortic stenosis s/p pericardial AVR, Bentall in 2011   . Contrast media allergy   . Obesity   . Prostate cancer ([place])   . Chronic cystitis   . 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 Dyspnea on exertion 03/18/2014  . Hyperlipidemia 03/18/2014  . GERD (gastroesophageal reflux disease) 03/18/2014  . Aneurysm of aorta ([place]) 07/03/2012  . History of prostate  cancer 07/03/2012  . Shoulder pain 05/02/2012  . Hematest positive stools 02/03/2012  . History of hypertension 05/18/2010  . SYNCOPE 02/18/2010  . ORTHOSTATIC DIZZINESS 02/18/2010  . Allergic rhinitis 02/16/2010  . Depression 01/30/2010  . History of anemia 01/30/2010  . Cardiomegaly 10/15/2009  . Vitamin D deficiency 09/03/2009  . Hammer toe 12/16/2008  . Osteoarthritis 12/03/2008  . Hearing loss 06/20/2008    Past Surgical History:  Procedure Laterality Date  . BACK SURGERY  1985  . CARPAL TUNNEL RELEASE     BIL WRISTS  . CHOLECYSTECTOMY  2010  . COLON SURGERY  2010   colon resection  . COLONOSCOPY  2008   [facility]  . COLONOSCOPY WITH PROPOFOL N/A 09/02/2017   Procedure: COLONOSCOPY WITH PROPOFOL;  Surgeon: Doran Stabler, MD;  Location: [facility];  Service: Gastroenterology;  Laterality: N/A;  . doppler carotid  2012  . DOPPLER ECHOCARDIOGRAPHY  2011  . POLYPECTOMY  09/02/2017   Procedure: POLYPECTOMY;  Surgeon: Doran Stabler, MD;  Location: [facility];  Service: Gastroenterology;;  . PROSTATE SURGERY  2011   adenocarcinoma w/raddiation   . ROTATOR CUFF REPAIR     X2 LEFT SHOULDER  . THORACIC AORTIC ANEURYSM REPAIR  12/11/09   ASCENDING THORACIC AORTIC ANEURYSM REPAIR  . TISSUE AORTIC VALVE REPLACEMENT  12/11/09  . TOTAL HIP ARTHROPLASTY Left 04/17/2015   Procedure: LEFT TOTAL HIP ARTHROPLASTY ANTERIOR APPROACH;  Surgeon: Rod Can, MD;  Location: [facility];  Service: Orthopedics;  Laterality: Left;        Home Medications    Prior to Admission medications   Medication Sig Start Date End Date Taking? Authorizing Provider  acetaminophen (TYLENOL) 650 MG CR tablet Take 1,300 mg by mouth daily.     [provider]  ASPIRIN 81 PO Take 1 tablet by mouth daily.    [provider]  Cholecalciferol (VITAMIN D PO) Take 1 tablet by mouth daily.    [provider]  hydrochlorothiazide (HYDRODIURIL) 25 MG tablet Take 25 mg by mouth daily.     [provider]  lansoprazole (PREVACID) 15 MG capsule Take 15 mg by mouth daily at 12 noon.    [provider]  losartan (COZAAR) 100 MG tablet Take 100 mg by mouth daily. 06/28/16   [provider]  montelukast (SINGULAIR) 10 MG tablet Take 10 mg by mouth daily.    [provider]  simvastatin (ZOCOR) 20 MG tablet Take 20 mg by mouth daily.     [provider]  tamsulosin (FLOMAX) 0.4 MG CAPS capsule Take 0.4 mg by mouth daily. 08/23/17   [provider]    Family History Family History  Problem Relation Age of Onset  . Stroke Mother 43  . Heart disease Father 53  . Heart attack Father   . Colon cancer Maternal Aunt   . Stomach cancer Neg Hx   .  Esophageal cancer Neg Hx     Social History Social History   Tobacco Use  . Smoking status: Former Smoker    Types: Cigarettes    Last attempt to quit: 03/16/1967    Years since quitting: 50.8  . Smokeless tobacco: Never Used  Substance Use Topics  . Alcohol use: No  . Drug use: No     Allergies   Beta adrenergic blockers; Iodinated diagnostic agents; Shellfish allergy; and Shellfish-derived products   Review of Systems Review of Systems All other systems negative except as documented in the HPI. All pertinent positives and negatives as reviewed in the HPI. Physical Exam Updated Vital Signs BP 124/71 (BP Location: Right Arm)   Pulse (!) 57   Temp 97.7 F (36.5 C) (Oral)   Resp 18   Ht 6\' 2"  (1.88 m)   Wt 117.9 kg   SpO2 98%   BMI 33.38 kg/m   Physical Exam  Constitutional: He is oriented to person, place, and time. He appears well-developed and well-nourished. No distress.  HENT:  Head: Normocephalic and atraumatic.  Mouth/Throat: Oropharynx is clear and moist.  Eyes: Pupils are equal, round, and reactive to light. Conjunctivae and EOM are normal.  Fundoscopic exam:      The left eye shows no exudate and no hemorrhage. The left eye shows red reflex.  Neck:  Normal range of motion. Neck supple.  Cardiovascular: Normal rate, regular rhythm and normal heart sounds. Exam reveals no gallop and no friction rub.  No murmur heard. Pulmonary/Chest: Effort normal and breath sounds normal. No respiratory distress. He has no wheezes.  Neurological: He is alert and oriented to person, place, and time. He displays normal reflexes. No cranial nerve deficit or sensory deficit. He exhibits normal muscle tone. Coordination normal. GCS eye subscore is 4. GCS verbal subscore is 5. GCS motor subscore is 6.  Skin: Skin is warm and dry. Capillary refill takes less than 2 seconds. No rash noted. No erythema.  Psychiatric: He has a normal mood and affect. His behavior is normal.  Nursing note and vitals reviewed.    ED Treatments / Results  Labs (all labs ordered are listed, but only abnormal results are displayed) Labs Reviewed  BASIC METABOLIC PANEL - Abnormal; Notable for the following components:      Result Value   Glucose, Bld 100 (*)    Creatinine, Ser 1.28 (*)    GFR calc non Af Amer 53 (*)    All other components within normal limits  URINALYSIS, ROUTINE W REFLEX MICROSCOPIC - Abnormal; Notable for the following components:   Color, Urine STRAW (*)    All other components within normal limits  CBC WITH DIFFERENTIAL/PLATELET    EKG None  Radiology Ct Head Wo Contrast  Result Date: 01/14/2018 CLINICAL DATA:  Worst headache of the life. EXAM: CT HEAD WITHOUT CONTRAST TECHNIQUE: Contiguous axial images were obtained from the base of the skull through the vertex without intravenous contrast. COMPARISON:  12/16/2009 FINDINGS: Brain: No evidence of acute infarction, hemorrhage, hydrocephalus, extra-axial collection or mass lesion/mass effect. Vascular: No hyperdense vessel or unexpected calcification. Skull: No osseous abnormality. Sinuses/Orbits: Small left sphenoid sinus air-fluid level. Visualized mastoid sinuses are clear. Visualized orbits demonstrate no  focal abnormality. Other: None IMPRESSION: No acute intracranial pathology. Electronically Signed   By: Kathreen Devoid   On: 01/14/2018 16:05   Mr Jodene Nam Head Wo Contrast  Result Date: 01/14/2018 CLINICAL DATA:  Headache.  Loss of vision left eye EXAM: MRI HEAD  WITHOUT CONTRAST MRA HEAD WITHOUT CONTRAST TECHNIQUE: Multiplanar, multiecho pulse sequences of the brain and surrounding structures were obtained without intravenous contrast. Angiographic images of the head were obtained using MRA technique without contrast. COMPARISON:  CT head 01/14/2018 FINDINGS: MRI HEAD FINDINGS Brain: Negative for acute infarct. Mild atrophy. Minimal chronic ischemic changes in the white matter. Negative for hemorrhage or mass. Vascular: Normal arterial flow voids Skull and upper cervical spine: Negative Sinuses/Orbits: Negative Other: None MRA HEAD FINDINGS Both vertebral arteries widely patent to the basilar. PICA patent bilaterally. Basilar widely patent. Posterior cerebral arteries widely patent bilaterally without stenosis. Internal carotid artery is patent bilaterally. Anterior middle cerebral arteries are patent bilaterally without stenosis. Negative for cerebral aneurysm. IMPRESSION: No acute intracranial abnormality.  Mild atrophy. Negative MRA head Electronically Signed   By: Franchot Gallo M.D.   On: 01/14/2018 21:41   Mr Brain Wo Contrast  Result Date: 01/14/2018 CLINICAL DATA:  Headache.  Loss of vision left eye EXAM: MRI HEAD WITHOUT CONTRAST MRA HEAD WITHOUT CONTRAST TECHNIQUE: Multiplanar, multiecho pulse sequences of the brain and surrounding structures were obtained without intravenous contrast. Angiographic images of the head were obtained using MRA technique without contrast. COMPARISON:  CT head 01/14/2018 FINDINGS: MRI HEAD FINDINGS Brain: Negative for acute infarct. Mild atrophy. Minimal chronic ischemic changes in the white matter. Negative for hemorrhage or mass. Vascular: Normal arterial flow voids Skull  and upper cervical spine: Negative Sinuses/Orbits: Negative Other: None MRA HEAD FINDINGS Both vertebral arteries widely patent to the basilar. PICA patent bilaterally. Basilar widely patent. Posterior cerebral arteries widely patent bilaterally without stenosis. Internal carotid artery is patent bilaterally. Anterior middle cerebral arteries are patent bilaterally without stenosis. Negative for cerebral aneurysm. IMPRESSION: No acute intracranial abnormality.  Mild atrophy. Negative MRA head Electronically Signed   By: Franchot Gallo M.D.   On: 01/14/2018 21:41    Procedures Procedures (including critical care time)  Medications Ordered in ED Medications  sodium chloride 0.9 % bolus 500 mL (0 mLs Intravenous Stopped 01/14/18 1723)     Initial Impression / Assessment and Plan / ED Course  I have reviewed the triage vital signs and the nursing notes.  Pertinent labs & imaging results that were available during my care of the patient were reviewed by me and considered in my medical decision making (see chart for details).     The patient could have had a retinal migraine that caused the brief loss of vision.  He also could have had a TIA.  Patient does not have any signs of stroke noted on MRI.  Patient will be referred back to his primary doctor.  Told to return here as needed.  Patient voiced an understanding and all questions were answered.  The patient has been ambulating without difficulties and had no visual changes while here in the emergency department.  Final Clinical Impressions(s) / ED Diagnoses   Final diagnoses:  None    ED Discharge Orders    None       Rebeca Allegra 01/14/18 2151    Julianne Rice, MD 01/18/18 1600

## 2018-01-14 NOTE — Discharge Instructions (Addendum)
Your MRI was normal.  Follow-up with your primary doctor for a recheck.

## 2018-01-14 NOTE — ED Triage Notes (Addendum)
Pt with temporary loss of vision of L eye at approximately 11:30 this morning, loss of vision for a few minutes, vision returned and pt states vision is back to normal. Pt states he had a headache immediately after losing vision, has taken 2 baby aspirin, headache continues. Pt with hx of migraines.

## 2018-01-14 NOTE — ED Notes (Signed)
Ed registration at bedside, and now being transported to CT will collect blood when returns to room

## 2018-01-16 DIAGNOSIS — D23111 Other benign neoplasm of skin of right upper eyelid, including canthus: Secondary | ICD-10-CM | POA: Diagnosis not present

## 2018-01-16 DIAGNOSIS — H3402 Transient retinal artery occlusion, left eye: Secondary | ICD-10-CM | POA: Diagnosis not present

## 2018-01-16 DIAGNOSIS — H25813 Combined forms of age-related cataract, bilateral: Secondary | ICD-10-CM | POA: Diagnosis not present

## 2018-01-17 DIAGNOSIS — E782 Mixed hyperlipidemia: Secondary | ICD-10-CM | POA: Diagnosis not present

## 2018-01-17 DIAGNOSIS — H53122 Transient visual loss, left eye: Secondary | ICD-10-CM | POA: Diagnosis not present

## 2018-01-17 DIAGNOSIS — G43109 Migraine with aura, not intractable, without status migrainosus: Secondary | ICD-10-CM | POA: Diagnosis not present

## 2018-01-18 ENCOUNTER — Other Ambulatory Visit: Payer: Self-pay | Admitting: Family Medicine

## 2018-01-18 DIAGNOSIS — H547 Unspecified visual loss: Secondary | ICD-10-CM

## 2018-01-20 ENCOUNTER — Ambulatory Visit
Admission: RE | Admit: 2018-01-20 | Discharge: 2018-01-20 | Disposition: A | Discharge status: Home / Self Care | Payer: Medicare Other | Source: Ambulatory Visit | Attending: Family Medicine | Admitting: Family Medicine

## 2018-01-20 DIAGNOSIS — H547 Unspecified visual loss: Secondary | ICD-10-CM

## 2018-01-20 DIAGNOSIS — I6521 Occlusion and stenosis of right carotid artery: Secondary | ICD-10-CM | POA: Diagnosis not present

## 2018-02-02 DIAGNOSIS — M1712 Unilateral primary osteoarthritis, left knee: Secondary | ICD-10-CM | POA: Diagnosis not present

## 2018-02-02 DIAGNOSIS — M25562 Pain in left knee: Secondary | ICD-10-CM | POA: Diagnosis not present

## 2018-02-14 DIAGNOSIS — M25562 Pain in left knee: Secondary | ICD-10-CM | POA: Diagnosis not present

## 2018-02-15 DIAGNOSIS — G43109 Migraine with aura, not intractable, without status migrainosus: Secondary | ICD-10-CM | POA: Diagnosis not present

## 2018-02-15 DIAGNOSIS — M1712 Unilateral primary osteoarthritis, left knee: Secondary | ICD-10-CM | POA: Diagnosis not present

## 2018-02-15 DIAGNOSIS — Z6837 Body mass index (BMI) 37.0-37.9, adult: Secondary | ICD-10-CM | POA: Diagnosis not present

## 2018-02-20 DIAGNOSIS — E782 Mixed hyperlipidemia: Secondary | ICD-10-CM | POA: Diagnosis not present

## 2018-02-20 DIAGNOSIS — C61 Malignant neoplasm of prostate: Secondary | ICD-10-CM | POA: Diagnosis not present

## 2018-02-20 DIAGNOSIS — Z1211 Encounter for screening for malignant neoplasm of colon: Secondary | ICD-10-CM | POA: Diagnosis not present

## 2018-02-20 DIAGNOSIS — Z0000 Encounter for general adult medical examination without abnormal findings: Secondary | ICD-10-CM | POA: Diagnosis not present

## 2018-02-20 DIAGNOSIS — Z6837 Body mass index (BMI) 37.0-37.9, adult: Secondary | ICD-10-CM | POA: Diagnosis not present

## 2018-02-20 DIAGNOSIS — I10 Essential (primary) hypertension: Secondary | ICD-10-CM | POA: Diagnosis not present

## 2018-02-20 DIAGNOSIS — Z954 Presence of other heart-valve replacement: Secondary | ICD-10-CM | POA: Diagnosis not present

## 2018-02-20 DIAGNOSIS — K219 Gastro-esophageal reflux disease without esophagitis: Secondary | ICD-10-CM | POA: Diagnosis not present

## 2018-02-24 DIAGNOSIS — S83242D Other tear of medial meniscus, current injury, left knee, subsequent encounter: Secondary | ICD-10-CM | POA: Diagnosis not present

## 2018-02-24 DIAGNOSIS — M1712 Unilateral primary osteoarthritis, left knee: Secondary | ICD-10-CM | POA: Diagnosis not present

## 2018-02-28 ENCOUNTER — Telehealth: Payer: Self-pay

## 2018-02-28 NOTE — Telephone Encounter (Signed)
   [facility] Pre-operative Risk Assessment    Request for surgical clearance:  1. What type of surgery is being performed?  Left knee scope:  Meniscal debridement   2. When is this surgery scheduled? 03/28/2018   3. What type of clearance is required (medical clearance vs. Pharmacy clearance to hold med vs. Both)?  medical  4. Are there any medications that need to be held prior to surgery and how long?    5. Practice name and name of physician performing surgery? [facility]/ Dr Wynelle Link   6. What is your office phone number [phone]    7.   What is your office fax number [phone]  8.   Anesthesia type (None, local, MAC, general) ?  choice   Frederik Schmidt 02/28/2018, 1:46 PM  _________________________________________________________________   (provider comments below)

## 2018-03-02 NOTE — Telephone Encounter (Signed)
   Primary Cardiologist: [name], MD  Chart reviewed as part of pre-operative protocol coverage. Patient was contacted 03/02/2018 in reference to pre-operative risk assessment for pending surgery as outlined below.  Jeff Johnson was last seen on 12/19/17 by Dr. Acie Fredrickson. Dr. Acie Fredrickson weighed in on need for possible knee surgery. His note states, "Left knee pain :  May need to have left knee surgery.  He is at low risk for surgery if he decides to do it".   I'ts been over 2 months since that OV. Phone call was placed x 2 but unable to reach. No mailbox to leave VM. Will attempt to call back. If he is w/o any new symptoms, then he can be cleared.   Lyda Jester, PA-C 03/02/2018, 3:56 PM

## 2018-03-03 ENCOUNTER — Telehealth: Payer: Self-pay | Admitting: Cardiovascular Disease

## 2018-03-03 NOTE — Telephone Encounter (Signed)
   Primary Cardiologist: [name], MD  Chart reviewed as part of pre-operative protocol coverage. Pt also contacted by phone and doing well w/o anginal symptoms. Given past medical history and time since last visit, based on ACC/AHA guidelines, Jeff Johnson would be at acceptable risk for the planned procedure without further cardiovascular testing.    If needed, ok to hold ASA prior to surgery. Resume as soon as safe to do so from a surgical standpoint.   I will route this recommendation to the requesting party via Epic fax function and remove from pre-op pool.  Please call with questions.  Lyda Jester, PA-C 03/03/2018, 4:33 PM

## 2018-03-03 NOTE — Telephone Encounter (Signed)
New Message   Pt returning call for Pre op

## 2018-03-29 DIAGNOSIS — R05 Cough: Secondary | ICD-10-CM | POA: Diagnosis not present

## 2018-03-29 DIAGNOSIS — J029 Acute pharyngitis, unspecified: Secondary | ICD-10-CM | POA: Diagnosis not present

## 2018-03-29 DIAGNOSIS — R509 Fever, unspecified: Secondary | ICD-10-CM | POA: Diagnosis not present

## 2018-03-29 DIAGNOSIS — J069 Acute upper respiratory infection, unspecified: Secondary | ICD-10-CM | POA: Diagnosis not present

## 2018-03-29 DIAGNOSIS — J028 Acute pharyngitis due to other specified organisms: Secondary | ICD-10-CM | POA: Diagnosis not present

## 2018-04-05 DIAGNOSIS — M1712 Unilateral primary osteoarthritis, left knee: Secondary | ICD-10-CM | POA: Diagnosis not present

## 2018-04-05 DIAGNOSIS — H6123 Impacted cerumen, bilateral: Secondary | ICD-10-CM | POA: Diagnosis not present

## 2018-04-05 DIAGNOSIS — J069 Acute upper respiratory infection, unspecified: Secondary | ICD-10-CM | POA: Diagnosis not present

## 2018-04-10 DIAGNOSIS — E782 Mixed hyperlipidemia: Secondary | ICD-10-CM | POA: Diagnosis not present

## 2018-04-10 DIAGNOSIS — I10 Essential (primary) hypertension: Secondary | ICD-10-CM | POA: Diagnosis not present

## 2018-04-10 DIAGNOSIS — R739 Hyperglycemia, unspecified: Secondary | ICD-10-CM | POA: Diagnosis not present

## 2018-04-14 DIAGNOSIS — E782 Mixed hyperlipidemia: Secondary | ICD-10-CM | POA: Diagnosis not present

## 2018-04-14 DIAGNOSIS — Z6837 Body mass index (BMI) 37.0-37.9, adult: Secondary | ICD-10-CM | POA: Diagnosis not present

## 2018-04-14 DIAGNOSIS — I10 Essential (primary) hypertension: Secondary | ICD-10-CM | POA: Diagnosis not present

## 2018-04-14 DIAGNOSIS — R739 Hyperglycemia, unspecified: Secondary | ICD-10-CM | POA: Diagnosis not present

## 2018-04-25 DIAGNOSIS — G8918 Other acute postprocedural pain: Secondary | ICD-10-CM | POA: Diagnosis not present

## 2018-04-25 DIAGNOSIS — M23322 Other meniscus derangements, posterior horn of medial meniscus, left knee: Secondary | ICD-10-CM | POA: Diagnosis not present

## 2018-04-25 DIAGNOSIS — M948X6 Other specified disorders of cartilage, lower leg: Secondary | ICD-10-CM | POA: Diagnosis not present

## 2018-07-17 DIAGNOSIS — I10 Essential (primary) hypertension: Secondary | ICD-10-CM | POA: Diagnosis not present

## 2018-07-17 DIAGNOSIS — R739 Hyperglycemia, unspecified: Secondary | ICD-10-CM | POA: Diagnosis not present

## 2018-07-17 DIAGNOSIS — R7301 Impaired fasting glucose: Secondary | ICD-10-CM | POA: Diagnosis not present

## 2018-07-18 DIAGNOSIS — R739 Hyperglycemia, unspecified: Secondary | ICD-10-CM | POA: Diagnosis not present

## 2018-07-18 DIAGNOSIS — N289 Disorder of kidney and ureter, unspecified: Secondary | ICD-10-CM | POA: Diagnosis not present

## 2018-07-18 DIAGNOSIS — Z719 Counseling, unspecified: Secondary | ICD-10-CM | POA: Diagnosis not present

## 2018-08-21 DIAGNOSIS — R5383 Other fatigue: Secondary | ICD-10-CM | POA: Diagnosis not present

## 2018-08-21 DIAGNOSIS — R0600 Dyspnea, unspecified: Secondary | ICD-10-CM | POA: Diagnosis not present

## 2018-08-21 DIAGNOSIS — Z954 Presence of other heart-valve replacement: Secondary | ICD-10-CM | POA: Diagnosis not present

## 2018-08-21 DIAGNOSIS — N289 Disorder of kidney and ureter, unspecified: Secondary | ICD-10-CM | POA: Diagnosis not present

## 2018-08-21 DIAGNOSIS — E782 Mixed hyperlipidemia: Secondary | ICD-10-CM | POA: Diagnosis not present

## 2018-08-21 DIAGNOSIS — R739 Hyperglycemia, unspecified: Secondary | ICD-10-CM | POA: Diagnosis not present

## 2018-08-21 DIAGNOSIS — I10 Essential (primary) hypertension: Secondary | ICD-10-CM | POA: Diagnosis not present

## 2018-08-25 DIAGNOSIS — E782 Mixed hyperlipidemia: Secondary | ICD-10-CM | POA: Diagnosis not present

## 2018-08-25 DIAGNOSIS — E559 Vitamin D deficiency, unspecified: Secondary | ICD-10-CM | POA: Diagnosis not present

## 2018-08-25 DIAGNOSIS — I10 Essential (primary) hypertension: Secondary | ICD-10-CM | POA: Diagnosis not present

## 2018-08-25 DIAGNOSIS — I509 Heart failure, unspecified: Secondary | ICD-10-CM | POA: Diagnosis not present

## 2018-09-22 ENCOUNTER — Other Ambulatory Visit: Payer: Self-pay | Admitting: Internal Medicine

## 2018-09-28 LAB — NOVEL CORONAVIRUS, NAA: SARS-CoV-2, NAA: NOT DETECTED

## 2018-10-11 ENCOUNTER — Other Ambulatory Visit: Payer: Self-pay

## 2018-11-08 DIAGNOSIS — M25511 Pain in right shoulder: Secondary | ICD-10-CM | POA: Diagnosis not present

## 2018-11-13 DIAGNOSIS — Z23 Encounter for immunization: Secondary | ICD-10-CM | POA: Diagnosis not present

## 2018-11-17 DIAGNOSIS — M25511 Pain in right shoulder: Secondary | ICD-10-CM | POA: Diagnosis not present

## 2018-11-23 DIAGNOSIS — S46011D Strain of muscle(s) and tendon(s) of the rotator cuff of right shoulder, subsequent encounter: Secondary | ICD-10-CM | POA: Diagnosis not present

## 2018-11-23 DIAGNOSIS — M25511 Pain in right shoulder: Secondary | ICD-10-CM | POA: Diagnosis not present

## 2018-12-08 DIAGNOSIS — M75102 Unspecified rotator cuff tear or rupture of left shoulder, not specified as traumatic: Secondary | ICD-10-CM | POA: Diagnosis not present

## 2018-12-08 DIAGNOSIS — R945 Abnormal results of liver function studies: Secondary | ICD-10-CM | POA: Diagnosis not present

## 2018-12-08 DIAGNOSIS — E782 Mixed hyperlipidemia: Secondary | ICD-10-CM | POA: Diagnosis not present

## 2018-12-19 ENCOUNTER — Encounter: Payer: Self-pay | Admitting: Cardiovascular Disease

## 2018-12-19 ENCOUNTER — Ambulatory Visit (INDEPENDENT_AMBULATORY_CARE_PROVIDER_SITE_OTHER): Payer: Medicare Other | Admitting: Cardiovascular Disease

## 2018-12-19 ENCOUNTER — Other Ambulatory Visit: Payer: Self-pay

## 2018-12-19 VITALS — BP 98/66 | HR 66 | Ht 72.0 in | Wt 258.4 lb

## 2018-12-19 DIAGNOSIS — Z952 Presence of prosthetic heart valve: Secondary | ICD-10-CM | POA: Diagnosis not present

## 2018-12-19 DIAGNOSIS — I10 Essential (primary) hypertension: Secondary | ICD-10-CM

## 2018-12-19 DIAGNOSIS — Z01818 Encounter for other preprocedural examination: Secondary | ICD-10-CM | POA: Diagnosis not present

## 2018-12-19 NOTE — Patient Instructions (Signed)
Medication Instructions:  Your provider recommends that you continue on your current medications as directed. Please refer to the Current Medication list given to you today.    Labwork: None  Testing/Procedures: Your provider has requested that you have an echocardiogram. Echocardiography is a painless test that uses sound waves to create images of your heart. It provides your doctor with information about the size and shape of your heart and how well your heart's chambers and valves are working. This procedure takes approximately one hour. There are no restrictions for this procedure.  Follow-Up: Your provider wants you to follow-up in: 1 year with Dr. Acie Fredrickson. You will receive a reminder letter in the mail two months in advance. If you don't receive a letter, please call our office to schedule the follow-up appointment.    Any Other Special Instructions Will Be Listed Below (If Applicable).     If you need a refill on your cardiac medications before your next appointment, please call your pharmacy.

## 2018-12-19 NOTE — Progress Notes (Signed)
Cardiology Office Note   Date:  12/19/2018   ID:  Jeff Johnson, Jeff Johnson 03-18-1945, MRN [id number]  PCP:  [name], MD  Cardiologist:   [name], MD   Chief Complaint  Patient presents with  . Hypertension  . Aortic Stenosis   1. Aortic stenosis/ aortic insufficiency-status post Bentall procedure- 2011 2. History of chronic cystitis 3. Hyperlipidemia 4. Dizziness - ? orthostasis. Negative tilt table study. 30 day monitor negative. Occurs with standing or sitting, did not improve wit Midodrine     73 yo with hx of aortic valve disease - s/p Bentall procedure.   He's been having some problems with dizziness. He has seen Dr. Crissie Sickles. He's had a 30 day event monitor which was negative. He had a tilt table study which was negative. He has had episodes of dizziness while standing and with sitting. He usually has several seconds of warning. If he is driving his car he was able to pull over to the side of the road. He has tried Midodrine but does not tolerate it very well. It causes him to be anxious and also causes urinary retention. He also has been eating lots of salt and that helped some.  He complains of generalized fatigue. He does not sleep very well. He has had prostate cancer and has to go to the bathroom at least 3 times a night.  Nov. 6, 2014:   Jeff Johnson is doing well. He is working part time which involves standing and walking for 8 hours. ( [employer]) No CP, no dyspnea.   May 21, 2014:  Jeff Johnson is a 73 y.o. male who presents for follow up of his Bentall procedure in 2011.   Has recorded his BP.   Readings are mostly ok.  No CP , no dyspnea  Dec. 5, 2016:  Doing ok from a cardiac standpoint.  Having some hip pain . Needs to have cardiac clearance.  Following up with Dr. Prescott Gum for an aortic aneurism.   August 30, 2016:  Jeff Johnson is seen back for follow up  Was last seen 1.5 years ago Has been having lots of leg ( hip and  knee problems )  He is s/p Bentall procedure in 2011.   Has some dizziness when he stands up  Has shortness of breath when he bends over.  Has had some leg swelling and Dr. Ernie Hew started HCTZ 25 mg a day   Oct. 7, 2019:  Doing well. Not exercising much.   Has knee problems .  Has severe pain in left knee  No CP . Has DOE walking up stairs.   BP is low / normal   December 19, 2018:   Jeff Johnson is seen today for follow-up of his Bentall procedure, hypertension, hyperlipidemia. Wt last year is 262 lbs.   Wt. Is 258 lbs.    He is more short of breath compared to last year.  He has significant shortness of breath climbing stairs.  He also notes that he gets very short of breath when he bends over and then stands back up.  He is never had any actual chest discomfort recently. Needs to have a  R shoulder replacement   Is very deconditioned.   Does not exercise. Walks only 2000 steps a day        Past Medical History:  Diagnosis Date  . Arthritis   . Benign essential HTN 05/21/2014  . BPH (benign prostatic hyperplasia)   . Chronic  cystitis    a. With ongoing hematuria.  . Contrast media allergy   . Diverticulitis    a. 2010: diverticulitis with stricture Sigmoid colectomy with mobilization of the splenic flexure within the end anastomosis.   . Dizziness and giddiness    a. H/o dizziness with reportedly negative tilt table. Holter 2012: NSR, sinus tachy, frequent PVCs, multifocal at times in a trigeminal fashion.  Marland Kitchen Dyspnea on exertion 03/18/2014  . Frequency of urination   . GERD (gastroesophageal reflux disease)   . H/O cardiac catheterization    a. 2011: minor luminal irregularities. b. Nuc 03/2014: ow risk without reversible ischemia or infarction, EF 57%. (There is septal akinesis but otherwise normal wall motion. This is nonspecific.)  . Headache    MIGRAINES  . Hematuria   . History of urinary retention   . Hyperlipidemia 03/18/2014  . Hyponatremia    a. Remote hx  hyponatremia felt 2/2 SSRI.  Marland Kitchen Nocturia   . Obesity   . Orthostatic hypotension   . Prostate cancer ([place]) 2011   a. s/p radiation.  . Rash    LEFT KNEE   . 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 Severe aortic stenosis    a. 11/2009: pericardial AVR/Bentall.  . SYNCOPE 02/18/2010   Qualifier: Diagnosis of  By: Lovena Le, MD, Martyn Malay   . Thoracic aortic aneurysm [facility])    a. 11/2009: s/p Magna Ease pericardial tissue valve size 45mm and replacement of fusiform ascending thoracic aneurysm with 30-mm Hemashield cath with hemiarch reconstruction.    Past Surgical History:  Procedure Laterality Date  . BACK SURGERY  1985  . CARPAL TUNNEL RELEASE     BIL WRISTS  . CHOLECYSTECTOMY  2010  . COLON SURGERY  2010   colon resection  . COLONOSCOPY  2008   [facility]  . COLONOSCOPY WITH PROPOFOL N/A 09/02/2017   Procedure: COLONOSCOPY WITH PROPOFOL;  Surgeon: Doran Stabler, MD;  Location: [facility];  Service: Gastroenterology;  Laterality: N/A;  . doppler carotid  2012  . DOPPLER ECHOCARDIOGRAPHY  2011  . POLYPECTOMY  09/02/2017   Procedure: POLYPECTOMY;  Surgeon: Doran Stabler, MD;  Location: [facility];  Service: Gastroenterology;;  . PROSTATE SURGERY  2011   adenocarcinoma w/raddiation   . ROTATOR CUFF REPAIR     X2 LEFT SHOULDER  . THORACIC AORTIC ANEURYSM REPAIR  12/11/09   ASCENDING THORACIC AORTIC ANEURYSM REPAIR  . TISSUE AORTIC VALVE REPLACEMENT  12/11/09  . TOTAL HIP ARTHROPLASTY Left 04/17/2015   Procedure: LEFT TOTAL HIP ARTHROPLASTY ANTERIOR APPROACH;  Surgeon: Rod Can, MD;  Location: [facility];  Service: Orthopedics;  Laterality: Left;     Current Outpatient Medications  Medication Sig Dispense Refill  . acetaminophen (TYLENOL) 650 MG CR tablet Take 1,300 mg by mouth daily.     . ASPIRIN 81 PO Take 1 tablet by mouth daily.    Marland Kitchen azelastine (ASTELIN) 0.1 % nasal spray Place 1 spray into both nostrils 2 (two) times daily. Use in each nostril as  directed    . Cholecalciferol (VITAMIN D PO) Take 1 tablet by mouth daily.    . hydrochlorothiazide (HYDRODIURIL) 25 MG tablet Take 12.5 mg by mouth daily.     . lansoprazole (PREVACID) 15 MG capsule Take 15 mg by mouth daily at 12 noon.    Marland Kitchen losartan (COZAAR) 100 MG tablet Take 100 mg by mouth daily.  4  . montelukast (SINGULAIR) 10 MG tablet Take 10 mg by mouth daily.    Marland Kitchen  simvastatin (ZOCOR) 20 MG tablet Take 20 mg by mouth daily.     . tamsulosin (FLOMAX) 0.4 MG CAPS capsule Take 0.4 mg by mouth daily.  6   No current facility-administered medications for this visit.     Allergies:   Beta adrenergic blockers, Iodinated diagnostic agents, Shellfish allergy, and Shellfish-derived products    Social History:  The patient  reports that he quit smoking about 51 years ago. His smoking use included cigarettes. He has never used smokeless tobacco. He reports that he does not drink alcohol or use drugs.   Family History:  The patient's family history includes Colon cancer in his maternal aunt; Heart attack in his father; Heart disease (age of onset: 51) in his father; Stroke (age of onset: 46) in his mother.    ROS:  Please see the history of present illness.     Physical Exam: Blood pressure 98/66, pulse 66, height 6' (1.829 m), weight 258 lb 6.4 oz (117.2 kg), SpO2 98 %.  GEN:  Well nourished, well developed in no acute distress HEENT: Normal NECK: No JVD; No carotid bruits LYMPHATICS: No lymphadenopathy CARDIAC: RRR, no murmurs, rubs, gallops RESPIRATORY:  Clear to auscultation without rales, wheezing or rhonchi  ABDOMEN: Soft, non-tender, non-distended MUSCULOSKELETAL:  No edema; No deformity  SKIN: Warm and dry NEUROLOGIC:  Alert and oriented x 3    EKG: December 18, 2018: Normal sinus rhythm with a heart rate of 66.  Nonspecific ST and T wave abnormalities.  Previous inferior wall myocardial infarction.  No acute changes.   Recent Labs: 01/14/2018: BUN 18; Creatinine, Ser  1.28; Hemoglobin 13.4; Platelets 156; Potassium 3.7; Sodium 139    Lipid Panel    Component Value Date/Time   CHOL 129 02/17/2015 1223   TRIG 163 (H) 02/17/2015 1223   HDL 31 (L) 02/17/2015 1223   CHOLHDL 4.2 02/17/2015 1223   VLDL 33 (H) 02/17/2015 1223   LDLCALC 65 02/17/2015 1223      Wt Readings from Last 3 Encounters:  12/19/18 258 lb 6.4 oz (117.2 kg)  01/14/18 260 lb (117.9 kg)  12/19/17 262 lb (118.8 kg)      Other studies Reviewed: Additional studies/ records that were reviewed today include: . Review of the above records demonstrates:    ASSESSMENT AND PLAN:  1. Aortic stenosis/ aortic insufficiency-status post Bentall procedure- 2011, followed by Dr. Prescott Gum.      2. Upcoming Right shoulder surgery : He has significant arthritis everywhere but now he apparently is being considered for right shoulder replacement.  He is having significant shortness of breath with exertion.  I actually think this is due to generalized deconditioning.  He only walks 2000 steps a day.  He does not get any regular exercise.  We will get an echocardiogram for further assessment of his left ventricular function and valvular function.  The echocardiogram from 2016 revealed a normally functioning aortic valve prosthesis.  Assuming that the echocardiogram does not show any significant abnormality, I think he would be at low risk from a cardiovascular standpoint but I think that his actual risk is much higher from a general medical standpoint.  He is extremely deconditioned and I think that he may have difficulty rehabbing following the surgery.  He will also be seeing Dr. Ernie Hew  prior to his surgery.  3. Hyperlipidemia - managed by Dr. Ernie Hew   4. Dizziness -    5.   Essential Hypertension:    BP has been low  6. Dyspnea:      7.   Left knee pain :    May need to have left knee surgery.  He is at low risk for surgery if he decides to do it   The following changes have been made:  no  change   Disposition:   FU with me in 1 year.     Signed, [name], MD  12/19/2018 9:30 AM    [facility] [address], [place], [place]  [address] Phone: [phone]; Fax: [phone]

## 2018-12-21 ENCOUNTER — Ambulatory Visit (HOSPITAL_COMMUNITY): Payer: Medicare Other | Attending: Cardiology

## 2018-12-21 ENCOUNTER — Other Ambulatory Visit: Payer: Self-pay

## 2018-12-21 DIAGNOSIS — Z952 Presence of prosthetic heart valve: Secondary | ICD-10-CM | POA: Diagnosis not present

## 2018-12-21 DIAGNOSIS — Z01818 Encounter for other preprocedural examination: Secondary | ICD-10-CM | POA: Insufficient documentation

## 2018-12-21 DIAGNOSIS — I10 Essential (primary) hypertension: Secondary | ICD-10-CM | POA: Insufficient documentation

## 2018-12-21 MED ORDER — PERFLUTREN LIPID MICROSPHERE
1.0000 mL | INTRAVENOUS | Status: AC | PRN
  Administered 2018-12-21: 3 mL via INTRAVENOUS

## 2019-01-02 DIAGNOSIS — R739 Hyperglycemia, unspecified: Secondary | ICD-10-CM | POA: Diagnosis not present

## 2019-01-02 DIAGNOSIS — I10 Essential (primary) hypertension: Secondary | ICD-10-CM | POA: Diagnosis not present

## 2019-01-02 DIAGNOSIS — R5383 Other fatigue: Secondary | ICD-10-CM | POA: Diagnosis not present

## 2019-01-02 DIAGNOSIS — E782 Mixed hyperlipidemia: Secondary | ICD-10-CM | POA: Diagnosis not present

## 2019-01-02 DIAGNOSIS — R7301 Impaired fasting glucose: Secondary | ICD-10-CM | POA: Diagnosis not present

## 2019-01-02 DIAGNOSIS — N289 Disorder of kidney and ureter, unspecified: Secondary | ICD-10-CM | POA: Diagnosis not present

## 2019-01-02 DIAGNOSIS — Z1159 Encounter for screening for other viral diseases: Secondary | ICD-10-CM | POA: Diagnosis not present

## 2019-01-04 DIAGNOSIS — I10 Essential (primary) hypertension: Secondary | ICD-10-CM | POA: Diagnosis not present

## 2019-01-04 DIAGNOSIS — I509 Heart failure, unspecified: Secondary | ICD-10-CM | POA: Diagnosis not present

## 2019-01-04 DIAGNOSIS — Z6837 Body mass index (BMI) 37.0-37.9, adult: Secondary | ICD-10-CM | POA: Diagnosis not present

## 2019-01-04 DIAGNOSIS — R001 Bradycardia, unspecified: Secondary | ICD-10-CM | POA: Diagnosis not present

## 2019-01-04 DIAGNOSIS — C61 Malignant neoplasm of prostate: Secondary | ICD-10-CM | POA: Diagnosis not present

## 2019-01-04 DIAGNOSIS — Z954 Presence of other heart-valve replacement: Secondary | ICD-10-CM | POA: Diagnosis not present

## 2019-01-10 ENCOUNTER — Telehealth: Payer: Self-pay | Admitting: *Deleted

## 2019-01-10 NOTE — Telephone Encounter (Signed)
   [facility] Pre-operative Risk Assessment    Request for surgical clearance:  1. What type of surgery is being performed? RIGHT REVERSE TOTAL SHOULDER ARTHROSCOPY   2. When is this surgery scheduled? TBD   3. What type of clearance is required (medical clearance vs. Pharmacy clearance to hold med vs. Both)? MEDICAL  4. Are there any medications that need to be held prior to surgery and how long? ASA   5. Practice name and name of physician performing surgery? [facility]; DR. Remo Lipps NORRIS   6. What is your office phone number [phone]    7.   What is your office fax number [phone]  8.   Anesthesia type (None, local, MAC, general) ? GENERAL   Jeff Johnson 01/10/2019, 8:20 AM  _________________________________________________________________   (provider comments below)

## 2019-01-10 NOTE — Telephone Encounter (Signed)
Pt is at low risk for surgery Ok to hold ASA for 5-7 days prior to surgery

## 2019-01-10 NOTE — Telephone Encounter (Signed)
   Primary Cardiologist: [name], MD  Chart reviewed as part of pre-operative protocol coverage. Given past medical history and time since last visit, based on ACC/AHA guidelines, Jeff Johnson would be at acceptable risk for the planned procedure without further cardiovascular testing.   I will route this recommendation to the requesting party via Epic fax function and remove from pre-op pool. Patient was recently cleared by Dr. Acie Fredrickson as a low risk patient for the shoulder surgery.   Please call with questions.   Dr. Veverly Fells is requesting to hold aspirin prior to surgery, will check with MD to see if he would be ok with holding aspirin or prefer the patient to continue aspirin through the surgery.  [place], [place] 01/10/2019, 10:11 AM

## 2019-01-12 NOTE — Telephone Encounter (Signed)
Called the patient to inform him of holding ASA for 5-7 days prior to surgery. Patient verbalized an understanding and all if any questions were answered.

## 2019-02-02 ENCOUNTER — Other Ambulatory Visit: Payer: Self-pay

## 2019-02-06 NOTE — H&P (Signed)
Patient's anticipated LOS is less than 2 midnights, meeting these requirements: - Younger than 75 - Lives within 1 hour of care - Has a competent adult at home to recover with post-op recover - NO history of  - Chronic pain requiring opiods  - Diabetes  - Coronary Artery Disease  - Heart failure  - Heart attack  - Stroke  - DVT/VTE  - Cardiac arrhythmia  - Respiratory Failure/COPD  - Renal failure  - Anemia  - Advanced Liver disease       Jeff Johnson is an 73 y.o. male.    Chief Complaint: right shoulder pain  HPI: Pt is a 73 y.o. male complaining of right shoulder pain for multiple years. Pain had continually increased since the beginning. X-rays in the clinic show end-stage arthritic changes of the right shoulder. Pt has tried various conservative treatments which have failed to alleviate their symptoms, including injections and therapy. Various options are discussed with the patient. Risks, benefits and expectations were discussed with the patient. Patient understand the risks, benefits and expectations and wishes to proceed with surgery.   PCP:  [name], MD  D/C Plans: Home  PMH: Past Medical History:  Diagnosis Date  . Arthritis   . Benign essential HTN 05/21/2014  . BPH (benign prostatic hyperplasia)   . Chronic cystitis    a. With ongoing hematuria.  . Contrast media allergy   . Diverticulitis    a. 2010: diverticulitis with stricture Sigmoid colectomy with mobilization of the splenic flexure within the end anastomosis.   . Dizziness and giddiness    a. H/o dizziness with reportedly negative tilt table. Holter 2012: NSR, sinus tachy, frequent PVCs, multifocal at times in a trigeminal fashion.  Marland Kitchen Dyspnea on exertion 03/18/2014  . Frequency of urination   . GERD (gastroesophageal reflux disease)   . H/O cardiac catheterization    a. 2011: minor luminal irregularities. b. Nuc 03/2014: ow risk without reversible ischemia or infarction, EF 57%. (There is  septal akinesis but otherwise normal wall motion. This is nonspecific.)  . Headache    MIGRAINES  . Hematuria   . History of urinary retention   . Hyperlipidemia 03/18/2014  . Hyponatremia    a. Remote hx hyponatremia felt 2/2 SSRI.  Marland Kitchen Nocturia   . Obesity   . Orthostatic hypotension   . Prostate cancer ([place]) 2011   a. s/p radiation.  . Rash    LEFT KNEE   . 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 Severe aortic stenosis    a. 11/2009: pericardial AVR/Bentall.  . SYNCOPE 02/18/2010   Qualifier: Diagnosis of  By: Lovena Le, MD, Martyn Malay   . Thoracic aortic aneurysm [facility])    a. 11/2009: s/p Magna Ease pericardial tissue valve size 64mm and replacement of fusiform ascending thoracic aneurysm with 30-mm Hemashield cath with hemiarch reconstruction.    PSH: Past Surgical History:  Procedure Laterality Date  . BACK SURGERY  1985  . CARPAL TUNNEL RELEASE     BIL WRISTS  . CHOLECYSTECTOMY  2010  . COLON SURGERY  2010   colon resection  . COLONOSCOPY  2008   [facility]  . COLONOSCOPY WITH PROPOFOL N/A 09/02/2017   Procedure: COLONOSCOPY WITH PROPOFOL;  Surgeon: Doran Stabler, MD;  Location: [facility];  Service: Gastroenterology;  Laterality: N/A;  . doppler carotid  2012  . DOPPLER ECHOCARDIOGRAPHY  2011  . POLYPECTOMY  09/02/2017   Procedure: POLYPECTOMY;  Surgeon: Nelida Meuse III,  MD;  Location: [facility];  Service: Gastroenterology;;  . PROSTATE SURGERY  2011   adenocarcinoma w/raddiation   . ROTATOR CUFF REPAIR     X2 LEFT SHOULDER  . THORACIC AORTIC ANEURYSM REPAIR  12/11/09   ASCENDING THORACIC AORTIC ANEURYSM REPAIR  . TISSUE AORTIC VALVE REPLACEMENT  12/11/09  . TOTAL HIP ARTHROPLASTY Left 04/17/2015   Procedure: LEFT TOTAL HIP ARTHROPLASTY ANTERIOR APPROACH;  Surgeon: Rod Can, MD;  Location: [facility];  Service: Orthopedics;  Laterality: Left;    Social History:  reports that he quit smoking about 51 years ago. His smoking use included cigarettes.  He has never used smokeless tobacco. He reports that he does not drink alcohol or use drugs.  Allergies:  Beta blockers Iodine  Medications: No current facility-administered medications for this encounter.    Current Outpatient Medications  Medication Sig Dispense Refill  . acetaminophen (TYLENOL) 650 MG CR tablet Take 1,300 mg by mouth daily.     . ASPIRIN 81 PO Take 1 tablet by mouth daily.    Marland Kitchen azelastine (ASTELIN) 0.1 % nasal spray Place 1 spray into both nostrils 2 (two) times daily. Use in each nostril as directed    . Cholecalciferol (VITAMIN D PO) Take 1 tablet by mouth daily.    . hydrochlorothiazide (HYDRODIURIL) 25 MG tablet Take 12.5 mg by mouth daily.     . lansoprazole (PREVACID) 15 MG capsule Take 15 mg by mouth daily at 12 noon.    Marland Kitchen losartan (COZAAR) 100 MG tablet Take 100 mg by mouth daily.  4  . montelukast (SINGULAIR) 10 MG tablet Take 10 mg by mouth daily.    . simvastatin (ZOCOR) 20 MG tablet Take 20 mg by mouth daily.     . tamsulosin (FLOMAX) 0.4 MG CAPS capsule Take 0.4 mg by mouth daily.  6    No results found for this or any previous visit (from the past 48 hour(s)). No results found.  ROS: Pain with rom of the right upper extremity  Physical Exam: Alert and oriented 73 y.o. male in no acute distress Cranial nerves 2-12 intact Cervical spine: full rom with no tenderness, nv intact distally Chest: active breath sounds bilaterally, no wheeze rhonchi or rales Heart: regular rate and rhythm, no murmur Abd: non tender non distended with active bowel sounds Hip is stable with rom  Right shoulder painful rom nv intact distally No rashes or edema   Assessment/Plan Assessment: right shoulder cuff arthropathy  Plan:  Patient will undergo a right reverse total shoulder by Dr. Veverly Fells at [facility]. Risks benefits and expectations were discussed with the patient. Patient understand risks, benefits and expectations and wishes to proceed. Preoperative  templating of the joint replacement has been completed, documented, and submitted to the Operating Room personnel in order to optimize intra-operative equipment management.   Merla Riches PA-C, MPAS [facility] is now [facility] [address] [place], [place], [place] [address] Phone: [phone] [email or web address] Facebook  [employer]

## 2019-03-02 ENCOUNTER — Inpatient Hospital Stay: Admit: 2019-03-02 | Payer: Medicare Other | Admitting: Orthopedic Surgery

## 2019-03-02 SURGERY — ARTHROPLASTY, SHOULDER, TOTAL, REVERSE
Anesthesia: General | Site: Shoulder | Laterality: Right

## 2019-03-26 ENCOUNTER — Ambulatory Visit: Payer: Medicare Other | Attending: Internal Medicine

## 2019-03-26 DIAGNOSIS — Z23 Encounter for immunization: Secondary | ICD-10-CM | POA: Diagnosis not present

## 2019-03-26 NOTE — Progress Notes (Signed)
   Covid-19 Vaccination Clinic  Name:  Jeff Johnson    MRN: [id number] DOB: 1945-09-19  03/26/2019  [name] was observed post Covid-19 immunization for 15 minutes without incidence. He was provided with Vaccine Information Sheet and instruction to access the V-Safe system.   [name] was instructed to call 911 with any severe reactions post vaccine: Marland Kitchen Difficulty breathing  . Swelling of your face and throat  . A fast heartbeat  . A bad rash all over your body  . Dizziness and weakness    Immunizations Administered    Name Date Dose VIS Date Route   Pfizer COVID-19 Vaccine 03/26/2019  9:41 AM 0.3 mL 02/23/2019 Intramuscular   Manufacturer: [employer], [employer]   Lot: [id number]   [place]: [id number]

## 2019-04-15 ENCOUNTER — Ambulatory Visit: Payer: Medicare Other | Attending: Anesthesiology

## 2019-04-15 DIAGNOSIS — Z23 Encounter for immunization: Secondary | ICD-10-CM | POA: Insufficient documentation

## 2019-04-15 NOTE — Progress Notes (Signed)
   Covid-19 Vaccination Clinic  Name:  Jeff Johnson    MRN: [id number] DOB: 29-Oct-1945  04/15/2019  [name] was observed post Covid-19 immunization for 30 minutes based on pre-vaccination screening without incidence. He was provided with Vaccine Information Sheet and instruction to access the V-Safe system.   [name] was instructed to call 911 with any severe reactions post vaccine: Marland Kitchen Difficulty breathing  . Swelling of your face and throat  . A fast heartbeat  . A bad rash all over your body  . Dizziness and weakness    Immunizations Administered    Name Date Dose VIS Date Route   Pfizer COVID-19 Vaccine 04/15/2019 10:23 AM 0.3 mL 02/23/2019 Intramuscular   Manufacturer: [place]   Lot: [id number]   [place]: [id number]

## 2019-05-07 ENCOUNTER — Emergency Department (HOSPITAL_COMMUNITY): Payer: Medicare Other

## 2019-05-07 ENCOUNTER — Emergency Department (HOSPITAL_COMMUNITY)
Admission: EM | Admit: 2019-05-07 | Discharge: 2019-05-08 | Disposition: A | Discharge status: Home / Self Care | Payer: Medicare Other | Attending: Emergency Medicine | Admitting: Emergency Medicine

## 2019-05-07 ENCOUNTER — Encounter (HOSPITAL_COMMUNITY): Payer: Self-pay | Admitting: Emergency Medicine

## 2019-05-07 ENCOUNTER — Other Ambulatory Visit: Payer: Self-pay

## 2019-05-07 DIAGNOSIS — R0600 Dyspnea, unspecified: Secondary | ICD-10-CM | POA: Insufficient documentation

## 2019-05-07 DIAGNOSIS — Z954 Presence of other heart-valve replacement: Secondary | ICD-10-CM | POA: Diagnosis not present

## 2019-05-07 DIAGNOSIS — R0609 Other forms of dyspnea: Secondary | ICD-10-CM | POA: Diagnosis not present

## 2019-05-07 DIAGNOSIS — Z87891 Personal history of nicotine dependence: Secondary | ICD-10-CM | POA: Insufficient documentation

## 2019-05-07 DIAGNOSIS — Z96642 Presence of left artificial hip joint: Secondary | ICD-10-CM | POA: Diagnosis not present

## 2019-05-07 DIAGNOSIS — Z79899 Other long term (current) drug therapy: Secondary | ICD-10-CM | POA: Diagnosis not present

## 2019-05-07 DIAGNOSIS — I10 Essential (primary) hypertension: Secondary | ICD-10-CM | POA: Insufficient documentation

## 2019-05-07 DIAGNOSIS — Z7982 Long term (current) use of aspirin: Secondary | ICD-10-CM | POA: Diagnosis not present

## 2019-05-07 DIAGNOSIS — R0602 Shortness of breath: Secondary | ICD-10-CM | POA: Diagnosis not present

## 2019-05-07 LAB — CBC WITH DIFFERENTIAL/PLATELET
Abs Immature Granulocytes: 0.01 10*3/uL (ref 0.00–0.07)
Basophils Absolute: 0 10*3/uL (ref 0.0–0.1)
Basophils Relative: 1 %
Eosinophils Absolute: 0.1 10*3/uL (ref 0.0–0.5)
Eosinophils Relative: 2 %
HCT: 35.9 % — ABNORMAL LOW (ref 39.0–52.0)
Hemoglobin: 11.9 g/dL — ABNORMAL LOW (ref 13.0–17.0)
Immature Granulocytes: 0 %
Lymphocytes Relative: 31 %
Lymphs Abs: 1.4 10*3/uL (ref 0.7–4.0)
MCH: 30.7 pg (ref 26.0–34.0)
MCHC: 33.1 g/dL (ref 30.0–36.0)
MCV: 92.5 fL (ref 80.0–100.0)
Monocytes Absolute: 0.4 10*3/uL (ref 0.1–1.0)
Monocytes Relative: 8 %
Neutro Abs: 2.6 10*3/uL (ref 1.7–7.7)
Neutrophils Relative %: 58 %
Platelets: 154 10*3/uL (ref 150–400)
RBC: 3.88 MIL/uL — ABNORMAL LOW (ref 4.22–5.81)
RDW: 13.3 % (ref 11.5–15.5)
WBC: 4.5 10*3/uL (ref 4.0–10.5)
nRBC: 0 % (ref 0.0–0.2)

## 2019-05-07 LAB — BRAIN NATRIURETIC PEPTIDE: B Natriuretic Peptide: 189.6 pg/mL — ABNORMAL HIGH (ref 0.0–100.0)

## 2019-05-07 LAB — COMPREHENSIVE METABOLIC PANEL
ALT: 18 U/L (ref 0–44)
AST: 22 U/L (ref 15–41)
Albumin: 3.8 g/dL (ref 3.5–5.0)
Alkaline Phosphatase: 50 U/L (ref 38–126)
Anion gap: 8 (ref 5–15)
BUN: 12 mg/dL (ref 8–23)
CO2: 24 mmol/L (ref 22–32)
Calcium: 8.6 mg/dL — ABNORMAL LOW (ref 8.9–10.3)
Chloride: 108 mmol/L (ref 98–111)
Creatinine, Ser: 1.02 mg/dL (ref 0.61–1.24)
GFR calc Af Amer: >60 mL/min (ref >60)
GFR calc non Af Amer: >60 mL/min (ref >60)
Glucose, Bld: 111 mg/dL — ABNORMAL HIGH (ref 70–99)
Potassium: 3.5 mmol/L (ref 3.5–5.1)
Sodium: 140 mmol/L (ref 135–145)
Total Bilirubin: 1.5 mg/dL — ABNORMAL HIGH (ref 0.3–1.2)
Total Protein: 7 g/dL (ref 6.5–8.1)

## 2019-05-07 LAB — D-DIMER, QUANTITATIVE: D-Dimer, Quant: 2.6 ug/mL-FEU — ABNORMAL HIGH (ref 0.00–0.50)

## 2019-05-07 NOTE — ED Triage Notes (Signed)
Pt states that he has been SOb since open heart surgery 10 years ago but gotten worse with exertion in the past couple weeks.

## 2019-05-07 NOTE — ED Provider Notes (Signed)
Jeff Johnson   CSN: [id number] Arrival date & time: 05/07/19  1703     History Chief Complaint  Patient presents with  . Shortness of Breath    Jeff Johnson is a 74 y.o. male.  HPI He presents for evaluation of dyspnea on exertion for 3 weeks, with additional shortness of breath for last 10 years.  Last had cardiac echo done, about 4 months ago, showing normal ejection fraction.  He denies fever, chills, cough, nausea, vomiting, sick exposure or not taking his medications.  There are no other known modifying factors.    Past Medical History:  Diagnosis Date  . Arthritis   . Benign essential HTN 05/21/2014  . BPH (benign prostatic hyperplasia)   . Chronic cystitis    a. With ongoing hematuria.  . Contrast media allergy   . Diverticulitis    a. 2010: diverticulitis with stricture Sigmoid colectomy with mobilization of the splenic flexure within the end anastomosis.   . Dizziness and giddiness    a. H/o dizziness with reportedly negative tilt table. Holter 2012: NSR, sinus tachy, frequent PVCs, multifocal at times in a trigeminal fashion.  Marland Kitchen Dyspnea on exertion 03/18/2014  . Frequency of urination   . GERD (gastroesophageal reflux disease)   . H/O cardiac catheterization    a. 2011: minor luminal irregularities. b. Nuc 03/2014: ow risk without reversible ischemia or infarction, EF 57%. (There is septal akinesis but otherwise normal wall motion. This is nonspecific.)  . Headache    MIGRAINES  . Hematuria   . History of urinary retention   . Hyperlipidemia 03/18/2014  . Hyponatremia    a. Remote hx hyponatremia felt 2/2 SSRI.  Marland Kitchen Nocturia   . Obesity   . Orthostatic hypotension   . Prostate cancer ([place]) 2011   a. s/p radiation.  . Rash    LEFT KNEE   . 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 Severe aortic stenosis    a. 11/2009: pericardial AVR/Bentall.  . SYNCOPE 02/18/2010   Qualifier: Diagnosis of  By: Lovena Le, MD,  Martyn Malay   . Thoracic aortic aneurysm [facility])    a. 11/2009: s/p Magna Ease pericardial tissue valve size 16mm and replacement of fusiform ascending thoracic aneurysm with 30-mm Hemashield cath with hemiarch reconstruction.    Patient Active Problem List   Diagnosis Date Noted  . Positive colorectal cancer screening using Cologuard test   . Benign neoplasm of ascending colon   . Benign neoplasm of transverse colon   . Pain of left hip joint 04/22/2017  . History of revision of total replacement of left hip joint 04/09/2017  . History of left hip replacement 04/08/2017  . Osteoarthritis 04/06/2017  . Arthritis of right hand 02/07/2017  . Pain in right hand 02/07/2017  . 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 Essential hypertension 05/21/2014  . Orthostatic hypotension   . Elevated BP   . BPH (benign prostatic hyperplasia)   . Diverticulitis   . Hyponatremia   . H/O cardiac catheterization   . Hematuria   . Severe aortic stenosis s/p pericardial AVR, Bentall in 2011   . Contrast media allergy   . Obesity   . Prostate cancer ([place])   . Chronic cystitis   . 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 Dyspnea on exertion 03/18/2014  . Hyperlipidemia 03/18/2014  . GERD (gastroesophageal reflux disease) 03/18/2014  . Aneurysm of aorta ([place]) 07/03/2012  . History of prostate cancer  07/03/2012  . Shoulder pain 05/02/2012  . Hematest positive stools 02/03/2012  . History of hypertension 05/18/2010  . SYNCOPE 02/18/2010  . ORTHOSTATIC DIZZINESS 02/18/2010  . Allergic rhinitis 02/16/2010  . Depression 01/30/2010  . History of anemia 01/30/2010  . Cardiomegaly 10/15/2009  . Vitamin D deficiency 09/03/2009  . Hammer toe 12/16/2008  . Osteoarthritis 12/03/2008  . Hearing loss 06/20/2008    Past Surgical History:  Procedure Laterality Date  . BACK SURGERY  1985  . CARPAL TUNNEL RELEASE     BIL WRISTS  . CHOLECYSTECTOMY  2010  . COLON SURGERY  2010    colon resection  . COLONOSCOPY  2008   [facility]  . COLONOSCOPY WITH PROPOFOL N/A 09/02/2017   Procedure: COLONOSCOPY WITH PROPOFOL;  Surgeon: Doran Stabler, MD;  Location: [facility];  Service: Gastroenterology;  Laterality: N/A;  . doppler carotid  2012  . DOPPLER ECHOCARDIOGRAPHY  2011  . POLYPECTOMY  09/02/2017   Procedure: POLYPECTOMY;  Surgeon: Doran Stabler, MD;  Location: [facility];  Service: Gastroenterology;;  . PROSTATE SURGERY  2011   adenocarcinoma w/raddiation   . ROTATOR CUFF REPAIR     X2 LEFT SHOULDER  . THORACIC AORTIC ANEURYSM REPAIR  12/11/09   ASCENDING THORACIC AORTIC ANEURYSM REPAIR  . TISSUE AORTIC VALVE REPLACEMENT  12/11/09  . TOTAL HIP ARTHROPLASTY Left 04/17/2015   Procedure: LEFT TOTAL HIP ARTHROPLASTY ANTERIOR APPROACH;  Surgeon: Rod Can, MD;  Location: [facility];  Service: Orthopedics;  Laterality: Left;       Family History  Problem Relation Age of Onset  . Stroke Mother 76  . Heart disease Father 62  . Heart attack Father   . Colon cancer Maternal Aunt   . Stomach cancer Neg Hx   . Esophageal cancer Neg Hx     Social History   Tobacco Use  . Smoking status: Former Smoker    Types: Cigarettes    Quit date: 03/16/1967    Years since quitting: 52.1  . Smokeless tobacco: Never Used  Substance Use Topics  . Alcohol use: No  . Drug use: No    Home Medications Prior to Admission medications   Medication Sig Start Date End Date Taking? Authorizing Provider  acetaminophen (TYLENOL) 650 MG CR tablet Take 1,300 mg by mouth daily.     [provider]  ASPIRIN 81 PO Take 1 tablet by mouth daily.    [provider]  azelastine (ASTELIN) 0.1 % nasal spray Place 1 spray into both nostrils 2 (two) times daily. Use in each nostril as directed    [provider]  Cholecalciferol (VITAMIN D PO) Take 1 tablet by mouth daily.    [provider]  hydrochlorothiazide (HYDRODIURIL) 25 MG tablet Take 12.5 mg by  mouth daily.     [provider]  lansoprazole (PREVACID) 15 MG capsule Take 15 mg by mouth daily at 12 noon.    [provider]  losartan (COZAAR) 100 MG tablet Take 100 mg by mouth daily. 06/28/16   [provider]  montelukast (SINGULAIR) 10 MG tablet Take 10 mg by mouth daily.    [provider]  simvastatin (ZOCOR) 20 MG tablet Take 20 mg by mouth daily.     [provider]  tamsulosin (FLOMAX) 0.4 MG CAPS capsule Take 0.4 mg by mouth daily. 08/23/17   [provider]    Allergies    Beta adrenergic blockers, Iodinated diagnostic agents, Shellfish allergy, and Shellfish-derived products  Review of Systems   Review of Systems  All other systems reviewed and are negative.   Physical Exam Updated Vital Signs BP (!) 165/75 (BP Location: Left Arm)   Pulse 60   Temp 97.9 F (36.6 C) (Oral)   Resp (!) 24   Ht 6' (1.829 m)   Wt 120 kg   SpO2 99%   BMI 35.89 kg/m   Physical Exam Vitals and nursing Johnson reviewed.  Constitutional:      General: He is not in acute distress.    Appearance: He is well-developed. He is not ill-appearing, toxic-appearing or diaphoretic.  HENT:     Head: Normocephalic and atraumatic.     Right Ear: External ear normal.     Left Ear: External ear normal.  Eyes:     Conjunctiva/sclera: Conjunctivae normal.     Pupils: Pupils are equal, round, and reactive to light.  Neck:     Trachea: Phonation normal.  Cardiovascular:     Rate and Rhythm: Normal rate and regular rhythm.     Heart sounds: Normal heart sounds.  Pulmonary:     Effort: Pulmonary effort is normal. No respiratory distress.     Breath sounds: Normal breath sounds. No stridor. No wheezing or rhonchi.  Abdominal:     General: There is no distension.     Palpations: Abdomen is soft.     Tenderness: There is no abdominal tenderness.  Musculoskeletal:        General: No tenderness or signs of injury. Normal range of motion.      Cervical back: Normal range of motion and neck supple.     Right lower leg: Edema present.     Left lower leg: Edema present.     Comments: 1+ pitting edema lower legs bilaterally.  Skin:    General: Skin is warm and dry.  Neurological:     Mental Status: He is alert and oriented to person, place, and time.     Cranial Nerves: No cranial nerve deficit.     Sensory: No sensory deficit.     Motor: No abnormal muscle tone.     Coordination: Coordination normal.  Psychiatric:        Mood and Affect: Mood normal.        Behavior: Behavior normal.        Thought Content: Thought content normal.        Judgment: Judgment normal.     ED Results / Procedures / Treatments   Labs (all labs ordered are listed, but only abnormal results are displayed) Labs Reviewed - No data to display  EKG None  Radiology DG Chest 2 View  Result Date: 05/07/2019 CLINICAL DATA:  74 year old male with shortness of breath. EXAM: CHEST - 2 VIEW COMPARISON:  Chest radiograph dated 08/24/2016 FINDINGS: No focal consolidation, pleural effusion, pneumothorax. The cardiac silhouette is within normal limits. Median sternotomy wires and mechanical cardiac valve. No acute osseous pathology. IMPRESSION: No active cardiopulmonary disease. Electronically Signed   By: Anner Crete M.D.   On: 05/07/2019 18:06    Procedures Procedures (including critical care time)  Medications Ordered in ED Medications - No data to display  ED Course  I have reviewed the triage vital signs and the nursing notes.  Pertinent labs & imaging results that were available during my care of the patient were reviewed by me and considered in my medical decision making (see chart for details).  Clinical Course as of May 07 1445  Mon May 07, 2019  1919 No CHF or infiltrate, interpreted by me  DG Chest 2 View [EW]  2129 No change in clinical status, oxygenation normal on room air.  He is aware that we are waiting for a VQ scan to be done.    [EW]    Clinical Course User Index [EW] Daleen Bo, MD   MDM Rules/Calculators/A&P                       Patient Vitals for the past 24 hrs:  BP Temp Temp src Pulse Resp SpO2 Height Weight  05/07/19 1714 -- -- -- -- -- -- 6' (1.829 m) 120 kg  05/07/19 1712 (!) 165/75 97.9 F (36.6 C) Oral 60 (!) 24 99 % -- --    VQ ordered to evaluate DOE  Medical Decision Making: Patient with dyspnea on exertion for 3 weeks, worsening, doubt ACS, pneumonia, metabolic instability, impending vascular collapse.   CRITICAL CARE- NO Performed by: Daleen Bo   Nursing Notes Reviewed/ Care Coordinated Applicable Imaging Reviewed Interpretation of Laboratory Data incorporated into ED treatment   Plan: Care to Dr. Leonides Schanz to evaluate after nuclear imaging.   Final Clinical Impression(s) / ED Diagnoses Final diagnoses:  DOE (dyspnea on exertion)    Rx / DC Orders ED Discharge Orders    None       Daleen Bo, MD 05/08/19 1454

## 2019-05-08 ENCOUNTER — Other Ambulatory Visit: Payer: Self-pay

## 2019-05-08 DIAGNOSIS — R0609 Other forms of dyspnea: Secondary | ICD-10-CM | POA: Diagnosis not present

## 2019-05-08 DIAGNOSIS — R030 Elevated blood-pressure reading, without diagnosis of hypertension: Secondary | ICD-10-CM | POA: Diagnosis not present

## 2019-05-08 DIAGNOSIS — R0602 Shortness of breath: Secondary | ICD-10-CM | POA: Diagnosis not present

## 2019-05-08 DIAGNOSIS — R0600 Dyspnea, unspecified: Secondary | ICD-10-CM | POA: Diagnosis not present

## 2019-05-08 LAB — TROPONIN I (HIGH SENSITIVITY): Troponin I (High Sensitivity): 14 ng/L (ref <18)

## 2019-05-08 MED ORDER — TECHNETIUM TO 99M ALBUMIN AGGREGATED
1.5200 | Freq: Once | INTRAVENOUS | Status: AC | PRN
  Administered 2019-05-07: 1.52 via INTRAVENOUS

## 2019-05-08 NOTE — ED Provider Notes (Signed)
12:10 AM  Assumed care from Dr. Eulis Foster.  Patient is a 74 year old male with history of CAD who presents to the emergency department shortness of breath with exertion x 2 weeks.  Labs unremarkable other than elevated D-dimer.  Chest x-ray clear.  Unable to obtain CT a of the chest due to anaphylaxis from CT contrast.  VQ scan pending.  We will also add on troponin to rule out ACS.  12:45 AM  Pt's EKG shows no ischemic change.  VQ scan shows normal perfusion.  Troponin pending.  Will ambulate with pulse ox.   3:00 AM  Pt's troponin is negative.  Patient able to ambulate and sats stayed at 94% or above.  He states he had similar symptoms when he had to have his aortic valve replaced 11 years ago.  He has never had any stents or CABG.  Have recommended close follow-up with his cardiologist Dr. Katharina Caper.  He is comfortable with this plan.  He has no symptoms at rest.  I do not think this is unstable angina.   At this time, I do not feel there is any life-threatening condition present. I have reviewed, interpreted and discussed all results (EKG, imaging, lab, urine as appropriate) and exam findings with patient/family. I have reviewed nursing notes and appropriate previous records.  I feel the patient is safe to be discharged home without further emergent workup and can continue workup as an outpatient as needed. Discussed usual and customary return precautions. Patient/family verbalize understanding and are comfortable with this plan.  Outpatient follow-up has been provided as needed. All questions have been answered.    EKG Interpretation  Date/Time:  Tuesday May 08 2019 00:32:25 EST Ventricular Rate:  69 PR Interval:    QRS Duration: 96 QT Interval:  466 QTC Calculation: 500 R Axis:   -33 Text Interpretation: Sinus rhythm Prolonged PR interval Left axis deviation Borderline prolonged QT interval No significant change since last tracing in 2016 Confirmed by Christian Treadway, Cyril Mourning [phone]) on 05/08/2019  12:36:30 AM         Jeannifer Drakeford, Delice Bison, DO 05/08/19 [id number]

## 2019-05-09 DIAGNOSIS — R0602 Shortness of breath: Secondary | ICD-10-CM | POA: Diagnosis not present

## 2019-05-09 DIAGNOSIS — I10 Essential (primary) hypertension: Secondary | ICD-10-CM | POA: Diagnosis not present

## 2019-05-09 DIAGNOSIS — Z0000 Encounter for general adult medical examination without abnormal findings: Secondary | ICD-10-CM | POA: Diagnosis not present

## 2019-05-09 DIAGNOSIS — Z6836 Body mass index (BMI) 36.0-36.9, adult: Secondary | ICD-10-CM | POA: Diagnosis not present

## 2019-05-16 ENCOUNTER — Encounter: Payer: Self-pay | Admitting: Cardiovascular Disease

## 2019-05-16 NOTE — Progress Notes (Signed)
Cardiology Office Note   Date:  05/16/2019   ID:  Sultan, Kettner February 29, 1946, MRN [id number]  PCP:  [name], MD  Cardiologist:   [name], MD   Chief Complaint  Patient presents with  . Hypertension   1. Aortic stenosis/ aortic insufficiency-status post Bentall procedure- 2011 2. History of chronic cystitis 3. Hyperlipidemia 4. Dizziness - ? orthostasis. Negative tilt table study. 30 day monitor negative. Occurs with standing or sitting, did not improve wit Midodrine     74 yo with hx of aortic valve disease - s/p Bentall procedure.   He's been having some problems with dizziness. He has seen Dr. Crissie Sickles. He's had a 30 day event monitor which was negative. He had a tilt table study which was negative. He has had episodes of dizziness while standing and with sitting. He usually has several seconds of warning. If he is driving his car he was able to pull over to the side of the road. He has tried Midodrine but does not tolerate it very well. It causes him to be anxious and also causes urinary retention. He also has been eating lots of salt and that helped some.  He complains of generalized fatigue. He does not sleep very well. He has had prostate cancer and has to go to the bathroom at least 3 times a night.  Nov. 6, 2014:   Bowyn is doing well. He is working part time which involves standing and walking for 8 hours. ( [employer]) No CP, no dyspnea.   May 21, 2014:  WHITAKER ISER is a 74 y.o. male who presents for follow up of his Bentall procedure in 2011.   Has recorded his BP.   Readings are mostly ok.  No CP , no dyspnea  Dec. 5, 2016:  Doing ok from a cardiac standpoint.  Having some hip pain . Needs to have cardiac clearance.  Following up with Dr. Prescott Gum for an aortic aneurism.   August 30, 2016:  Murvel is seen back for follow up  Was last seen 1.5 years ago Has been having lots of leg ( hip and knee problems )  He  is s/p Bentall procedure in 2011.   Has some dizziness when he stands up  Has shortness of breath when he bends over.  Has had some leg swelling and Dr. Ernie Hew started HCTZ 25 mg a day   Oct. 7, 2019:  Doing well. Not exercising much.   Has knee problems .  Has severe pain in left knee  No CP . Has DOE walking up stairs.   BP is low / normal   December 19, 2018:   Trenity is seen today for follow-up of his Bentall procedure, hypertension, hyperlipidemia. Wt last year is 262 lbs.   Wt. Is 258 lbs.    He is more short of breath compared to last year.  He has significant shortness of breath climbing stairs.  He also notes that he gets very short of breath when he bends over and then stands back up.  He is never had any actual chest discomfort recently. Needs to have a  R shoulder replacement   Is very deconditioned.   Does not exercise. Walks only 2000 steps a day    May 17, 2019  Yeeleng is seen for follow up of his Bentall procedure, HTN, HLS. He was seen in the emergency room for some episodes of shortness of breath.  Had an elevated  D-dimer level.  They were not able to do a CT angiogram because of anaphylaxis to the CT contrast.  VQ scan was normal indicating no evidence of pulmonary embolus.   Troponin levels were normal.  Is still having dyspnea, Gets short of breath when he bends over  Previous echocardiogram from 2020 reveals normal left ventricular systolic function.  He has grade 2 diastolic dysfunction.  He has an aortic valve replacement from a previous Bentall procedure.  The mean gradient of the aortic valve is 9 which is within normal limits.  Has lost weight  ( 2 lbs since last visit )   Has fallen ( when the power outage )      Past Medical History:  Diagnosis Date  . Arthritis   . Benign essential HTN 05/21/2014  . BPH (benign prostatic hyperplasia)   . Chronic cystitis    a. With ongoing hematuria.  . Contrast media allergy   . Diverticulitis    a.  2010: diverticulitis with stricture Sigmoid colectomy with mobilization of the splenic flexure within the end anastomosis.   . Dizziness and giddiness    a. H/o dizziness with reportedly negative tilt table. Holter 2012: NSR, sinus tachy, frequent PVCs, multifocal at times in a trigeminal fashion.  Marland Kitchen Dyspnea on exertion 03/18/2014  . Frequency of urination   . GERD (gastroesophageal reflux disease)   . H/O cardiac catheterization    a. 2011: minor luminal irregularities. b. Nuc 03/2014: ow risk without reversible ischemia or infarction, EF 57%. (There is septal akinesis but otherwise normal wall motion. This is nonspecific.)  . Headache    MIGRAINES  . Hematuria   . History of urinary retention   . Hyperlipidemia 03/18/2014  . Hyponatremia    a. Remote hx hyponatremia felt 2/2 SSRI.  Marland Kitchen Nocturia   . Obesity   . Orthostatic hypotension   . Prostate cancer ([place]) 2011   a. s/p radiation.  . Rash    LEFT KNEE   . 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 Severe aortic stenosis    a. 11/2009: pericardial AVR/Bentall.  . SYNCOPE 02/18/2010   Qualifier: Diagnosis of  By: Lovena Le, MD, Martyn Malay   . Thoracic aortic aneurysm [facility])    a. 11/2009: s/p Magna Ease pericardial tissue valve size 56mm and replacement of fusiform ascending thoracic aneurysm with 30-mm Hemashield cath with hemiarch reconstruction.    Past Surgical History:  Procedure Laterality Date  . BACK SURGERY  1985  . CARPAL TUNNEL RELEASE     BIL WRISTS  . CHOLECYSTECTOMY  2010  . COLON SURGERY  2010   colon resection  . COLONOSCOPY  2008   [facility]  . COLONOSCOPY WITH PROPOFOL N/A 09/02/2017   Procedure: COLONOSCOPY WITH PROPOFOL;  Surgeon: Doran Stabler, MD;  Location: [facility];  Service: Gastroenterology;  Laterality: N/A;  . doppler carotid  2012  . DOPPLER ECHOCARDIOGRAPHY  2011  . POLYPECTOMY  09/02/2017   Procedure: POLYPECTOMY;  Surgeon: Doran Stabler, MD;  Location: [facility];  Service:  Gastroenterology;;  . PROSTATE SURGERY  2011   adenocarcinoma w/raddiation   . ROTATOR CUFF REPAIR     X2 LEFT SHOULDER  . THORACIC AORTIC ANEURYSM REPAIR  12/11/09   ASCENDING THORACIC AORTIC ANEURYSM REPAIR  . TISSUE AORTIC VALVE REPLACEMENT  12/11/09  . TOTAL HIP ARTHROPLASTY Left 04/17/2015   Procedure: LEFT TOTAL HIP ARTHROPLASTY ANTERIOR APPROACH;  Surgeon: Rod Can, MD;  Location: [facility];  Service: Orthopedics;  Laterality: Left;     Current Outpatient Medications  Medication Sig Dispense Refill  . acetaminophen (TYLENOL) 650 MG CR tablet Take 1,300 mg by mouth daily.     . ASPIRIN 81 PO Take 1 tablet by mouth daily.    Marland Kitchen azelastine (ASTELIN) 0.1 % nasal spray Place 1 spray into both nostrils 2 (two) times daily. Use in each nostril as directed    . Cholecalciferol (VITAMIN D PO) Take 1 tablet by mouth daily.    . hydrochlorothiazide (HYDRODIURIL) 25 MG tablet Take 12.5 mg by mouth daily.     . lansoprazole (PREVACID) 15 MG capsule Take 15 mg by mouth daily at 12 noon.    Marland Kitchen losartan (COZAAR) 100 MG tablet Take 100 mg by mouth daily.  4  . montelukast (SINGULAIR) 10 MG tablet Take 10 mg by mouth daily.    . simvastatin (ZOCOR) 20 MG tablet Take 20 mg by mouth daily.     . tamsulosin (FLOMAX) 0.4 MG CAPS capsule Take 0.4 mg by mouth daily.  6   No current facility-administered medications for this visit.    Allergies:   Beta adrenergic blockers, Iodinated diagnostic agents, Shellfish allergy, and Shellfish-derived products    Social History:  The patient  reports that he quit smoking about 52 years ago. His smoking use included cigarettes. He has never used smokeless tobacco. He reports that he does not drink alcohol or use drugs.   Family History:  The patient's family history includes Colon cancer in his maternal aunt; Heart attack in his father; Heart disease (age of onset: 82) in his father; Stroke (age of onset: 47) in his mother.    ROS:  Please see the history of  present illness.    Physical Exam: There were no vitals taken for this visit.  GEN:   Morbidly obese male,   HEENT: Normal NECK: No JVD; No carotid bruits LYMPHATICS: No lymphadenopathy CARDIAC: RRR , no murmurs, rubs, gallops RESPIRATORY:  Clear to auscultation without rales, wheezing or rhonchi  ABDOMEN: Soft, non-tender, non-distended MUSCULOSKELETAL:  No edema; No deformity  SKIN: Warm and dry NEUROLOGIC:  Alert and oriented x 3    EKG:    Recent Labs: 05/07/2019: ALT 18; B Natriuretic Peptide 189.6; BUN 12; Creatinine, Ser 1.02; Hemoglobin 11.9; Platelets 154; Potassium 3.5; Sodium 140    Lipid Panel    Component Value Date/Time   CHOL 129 02/17/2015 1223   TRIG 163 (H) 02/17/2015 1223   HDL 31 (L) 02/17/2015 1223   CHOLHDL 4.2 02/17/2015 1223   VLDL 33 (H) 02/17/2015 1223   LDLCALC 65 02/17/2015 1223      Wt Readings from Last 3 Encounters:  05/07/19 264 lb 9.6 oz (120 kg)  12/19/18 258 lb 6.4 oz (117.2 kg)  01/14/18 260 lb (117.9 kg)      Other studies Reviewed: Additional studies/ records that were reviewed today include: . Review of the above records demonstrates:    ASSESSMENT AND PLAN:  1. Aortic stenosis/ aortic insufficiency-status post Bentall procedure-.     AV sounds stable   2.  Dyspnea:  I suspect this is due to his morbid obesity.   Have advised him to work on weight loss  I suspect this is his major issue    3. Hyperlipidemia - needs to work on weight loss   4. Dizziness -    5.   Essential Hypertension:    Cont. Meds.    6. Dyspnea:  Signed, [name], MD  05/16/2019 9:15 PM    [facility] [place], [place], [place]  [address] Phone: [phone]; Fax: [phone]

## 2019-05-17 ENCOUNTER — Encounter: Payer: Self-pay | Admitting: Cardiovascular Disease

## 2019-05-17 ENCOUNTER — Ambulatory Visit (INDEPENDENT_AMBULATORY_CARE_PROVIDER_SITE_OTHER): Payer: Medicare Other | Admitting: Cardiovascular Disease

## 2019-05-17 ENCOUNTER — Other Ambulatory Visit: Payer: Self-pay

## 2019-05-17 VITALS — BP 96/54 | HR 66 | Ht 72.0 in | Wt 256.1 lb

## 2019-05-17 DIAGNOSIS — R0609 Other forms of dyspnea: Secondary | ICD-10-CM

## 2019-05-17 DIAGNOSIS — R0600 Dyspnea, unspecified: Secondary | ICD-10-CM

## 2019-05-17 DIAGNOSIS — I10 Essential (primary) hypertension: Secondary | ICD-10-CM

## 2019-05-17 MED ORDER — LOSARTAN POTASSIUM 50 MG PO TABS: 50.0000 mg | ORAL_TABLET | Freq: Every day | ORAL | 3 refills | Status: DC

## 2019-05-17 NOTE — Patient Instructions (Addendum)
Chronic Diastolic CHF (congestive heart failure) Also known as   Heart Failure with preserved LV function   Medication Instructions:  Your physician has recommended you make the following change in your medication:  DECREASE Losartan to 50 mg once daily  *If you need a refill on your cardiac medications before your next appointment, please call your pharmacy*   Lab Work: None Ordered If you have labs (blood work) drawn today and your tests are completely normal, you will receive your results only by: Marland Kitchen MyChart Message (if you have MyChart) OR . A paper copy in the mail If you have any lab test that is abnormal or we need to change your treatment, we will call you to review the results.   Testing/Procedures: None Ordered   Follow-Up: At [facility], you and your health needs are our priority.  As part of our continuing mission to provide you with exceptional heart care, we have created designated Provider Care Teams.  These Care Teams include your primary Cardiologist (physician) and Advanced Practice Providers (APPs -  Physician Assistants and Nurse Practitioners) who all work together to provide you with the care you need, when you need it.  We recommend signing up for the patient portal called "MyChart".  Sign up information is provided on this After Visit Summary.  MyChart is used to connect with patients for Virtual Visits (Telemedicine).  Patients are able to view lab/test results, encounter notes, upcoming appointments, etc.  Non-urgent messages can be sent to your provider as well.   To learn more about what you can do with MyChart, go to [email or web address].    Your next appointment:   6 month(s)  The format for your next appointment:   Either In Person or Virtual  Provider:   Richardson Dopp, PA-C or [employer], PA-C   Other information:  Low-Sodium Eating Plan Sodium, which is an element that makes up salt, helps you maintain a healthy balance of fluids in your  body. Too much sodium can increase your blood pressure and cause fluid and waste to be held in your body. Your health care provider or dietitian may recommend following this plan if you have high blood pressure (hypertension), kidney disease, liver disease, or heart failure. Eating less sodium can help lower your blood pressure, reduce swelling, and protect your heart, liver, and kidneys. What are tips for following this plan? General guidelines  Most people on this plan should limit their sodium intake to 1,500-2,000 mg (milligrams) of sodium each day. Reading food labels   The Nutrition Facts label lists the amount of sodium in one serving of the food. If you eat more than one serving, you must multiply the listed amount of sodium by the number of servings.  Choose foods with less than 140 mg of sodium per serving.  Avoid foods with 300 mg of sodium or more per serving. Shopping  Look for lower-sodium products, often labeled as "low-sodium" or "no salt added."  Always check the sodium content even if foods are labeled as "unsalted" or "no salt added".  Buy fresh foods. ? Avoid canned foods and premade or frozen meals. ? Avoid canned, cured, or processed meats  Buy breads that have less than 80 mg of sodium per slice. Cooking  Eat more home-cooked food and less restaurant, buffet, and fast food.  Avoid adding salt when cooking. Use salt-free seasonings or herbs instead of table salt or sea salt. Check with your health care provider or pharmacist before using salt  substitutes.  Cook with plant-based oils, such as canola, sunflower, or olive oil. Meal planning  When eating at a restaurant, ask that your food be prepared with less salt or no salt, if possible.  Avoid foods that contain MSG (monosodium glutamate). MSG is sometimes added to [place] food, bouillon, and some canned foods. What foods are recommended? The items listed may not be a complete list. Talk with your dietitian  about what dietary choices are best for you. Grains Low-sodium cereals, including oats, puffed wheat and rice, and shredded wheat. Low-sodium crackers. Unsalted rice. Unsalted pasta. Low-sodium bread. Whole-grain breads and whole-grain pasta. Vegetables Fresh or frozen vegetables. "No salt added" canned vegetables. "No salt added" tomato sauce and paste. Low-sodium or reduced-sodium tomato and vegetable juice. Fruits Fresh, frozen, or canned fruit. Fruit juice. Meats and other protein foods Fresh or frozen (no salt added) meat, poultry, seafood, and fish. Low-sodium canned tuna and salmon. Unsalted nuts. Dried peas, beans, and lentils without added salt. Unsalted canned beans. Eggs. Unsalted nut butters. Dairy Milk. Soy milk. Cheese that is naturally low in sodium, such as ricotta cheese, fresh mozzarella, or Swiss cheese Low-sodium or reduced-sodium cheese. Cream cheese. Yogurt. Fats and oils Unsalted butter. Unsalted margarine with no trans fat. Vegetable oils such as canola or olive oils. Seasonings and other foods Fresh and dried herbs and spices. Salt-free seasonings. Low-sodium mustard and ketchup. Sodium-free salad dressing. Sodium-free light mayonnaise. Fresh or refrigerated horseradish. Lemon juice. Vinegar. Homemade, reduced-sodium, or low-sodium soups. Unsalted popcorn and pretzels. Low-salt or salt-free chips. What foods are not recommended? The items listed may not be a complete list. Talk with your dietitian about what dietary choices are best for you. Grains Instant hot cereals. Bread stuffing, pancake, and biscuit mixes. Croutons. Seasoned rice or pasta mixes. Noodle soup cups. Boxed or frozen macaroni and cheese. Regular salted crackers. Self-rising flour. Vegetables Sauerkraut, pickled vegetables, and relishes. Olives. [place] fries. Onion rings. Regular canned vegetables (not low-sodium or reduced-sodium). Regular canned tomato sauce and paste (not low-sodium or reduced-sodium).  Regular tomato and vegetable juice (not low-sodium or reduced-sodium). Frozen vegetables in sauces. Meats and other protein foods Meat or fish that is salted, canned, smoked, spiced, or pickled. Bacon, ham, sausage, hotdogs, corned beef, chipped beef, packaged lunch meats, salt pork, jerky, pickled herring, anchovies, regular canned tuna, sardines, salted nuts. Dairy Processed cheese and cheese spreads. Cheese curds. Blue cheese. Feta cheese. String cheese. Regular cottage cheese. Buttermilk. Canned milk. Fats and oils Salted butter. Regular margarine. Ghee. Bacon fat. Seasonings and other foods Onion salt, garlic salt, seasoned salt, table salt, and sea salt. Canned and packaged gravies. Worcestershire sauce. Tartar sauce. Barbecue sauce. Teriyaki sauce. Soy sauce, including reduced-sodium. Steak sauce. Fish sauce. Oyster sauce. Cocktail sauce. Horseradish that you find on the shelf. Regular ketchup and mustard. Meat flavorings and tenderizers. Bouillon cubes. Hot sauce and Tabasco sauce. Premade or packaged marinades. Premade or packaged taco seasonings. Relishes. Regular salad dressings. Salsa. Potato and tortilla chips. Corn chips and puffs. Salted popcorn and pretzels. Canned or dried soups. Pizza. Frozen entrees and pot pies. Summary  Eating less sodium can help lower your blood pressure, reduce swelling, and protect your heart, liver, and kidneys.  Most people on this plan should limit their sodium intake to 1,500-2,000 mg (milligrams) of sodium each day.  Canned, boxed, and frozen foods are high in sodium. Restaurant foods, fast foods, and pizza are also very high in sodium. You also get sodium by adding salt to food.  Try  to cook at home, eat more fresh fruits and vegetables, and eat less fast food, canned, processed, or prepared foods. This information is not intended to replace advice given to you by your health care provider. Make sure you discuss any questions you have with your health  care provider. Document Revised: 02/11/2017 Document Reviewed: 02/23/2016 Elsevier Patient Education  2020 [employer].

## 2019-05-18 ENCOUNTER — Telehealth: Payer: Self-pay | Admitting: Cardiovascular Disease

## 2019-05-18 NOTE — Telephone Encounter (Signed)
New Message  Pt c/o medication issue:  1. Name of Medication: hydrochlorothiazide (HYDRODIURIL) 25 MG tablet  2. How are you currently taking this medication (dosage and times per day)? As directed  3. Are you having a reaction (difficulty breathing--STAT)? No  4. What is your medication issue? Patient is calling in to see if he should still be taking this medication. States that he couldn't remember the medication changes that happened during his appointment on yesterday. Please call patient and advise.

## 2019-05-18 NOTE — Telephone Encounter (Signed)
Reviewed the recent OV note from pts appt yesterday and informed him that he should continue taking 12.5 mg/daily of his hydrochlorothiazide per Dr. Lanny Hurst most recent note. Pt verbalized understanding and is aware to call back with any further questions.

## 2019-05-21 DIAGNOSIS — E782 Mixed hyperlipidemia: Secondary | ICD-10-CM | POA: Diagnosis not present

## 2019-05-21 DIAGNOSIS — I509 Heart failure, unspecified: Secondary | ICD-10-CM | POA: Diagnosis not present

## 2019-05-21 DIAGNOSIS — C61 Malignant neoplasm of prostate: Secondary | ICD-10-CM | POA: Diagnosis not present

## 2019-05-21 DIAGNOSIS — I10 Essential (primary) hypertension: Secondary | ICD-10-CM | POA: Diagnosis not present

## 2019-05-21 DIAGNOSIS — R0602 Shortness of breath: Secondary | ICD-10-CM | POA: Diagnosis not present

## 2019-06-06 DIAGNOSIS — Z1211 Encounter for screening for malignant neoplasm of colon: Secondary | ICD-10-CM | POA: Diagnosis not present

## 2019-06-25 DIAGNOSIS — E782 Mixed hyperlipidemia: Secondary | ICD-10-CM | POA: Diagnosis not present

## 2019-06-25 DIAGNOSIS — N289 Disorder of kidney and ureter, unspecified: Secondary | ICD-10-CM | POA: Diagnosis not present

## 2019-06-25 DIAGNOSIS — R739 Hyperglycemia, unspecified: Secondary | ICD-10-CM | POA: Diagnosis not present

## 2019-06-25 DIAGNOSIS — E559 Vitamin D deficiency, unspecified: Secondary | ICD-10-CM | POA: Diagnosis not present

## 2019-06-25 DIAGNOSIS — I10 Essential (primary) hypertension: Secondary | ICD-10-CM | POA: Diagnosis not present

## 2019-06-25 DIAGNOSIS — Z79899 Other long term (current) drug therapy: Secondary | ICD-10-CM | POA: Diagnosis not present

## 2019-06-27 DIAGNOSIS — D23111 Other benign neoplasm of skin of right upper eyelid, including canthus: Secondary | ICD-10-CM | POA: Diagnosis not present

## 2019-06-27 DIAGNOSIS — H04123 Dry eye syndrome of bilateral lacrimal glands: Secondary | ICD-10-CM | POA: Diagnosis not present

## 2019-06-28 DIAGNOSIS — E782 Mixed hyperlipidemia: Secondary | ICD-10-CM | POA: Diagnosis not present

## 2019-06-28 DIAGNOSIS — I10 Essential (primary) hypertension: Secondary | ICD-10-CM | POA: Diagnosis not present

## 2019-06-28 DIAGNOSIS — Z6833 Body mass index (BMI) 33.0-33.9, adult: Secondary | ICD-10-CM | POA: Diagnosis not present

## 2019-06-28 DIAGNOSIS — E559 Vitamin D deficiency, unspecified: Secondary | ICD-10-CM | POA: Diagnosis not present

## 2019-11-28 DIAGNOSIS — Z23 Encounter for immunization: Secondary | ICD-10-CM | POA: Diagnosis not present

## 2019-12-10 DIAGNOSIS — Z23 Encounter for immunization: Secondary | ICD-10-CM | POA: Diagnosis not present

## 2019-12-31 DIAGNOSIS — E782 Mixed hyperlipidemia: Secondary | ICD-10-CM | POA: Diagnosis not present

## 2019-12-31 DIAGNOSIS — I10 Essential (primary) hypertension: Secondary | ICD-10-CM | POA: Diagnosis not present

## 2019-12-31 DIAGNOSIS — E559 Vitamin D deficiency, unspecified: Secondary | ICD-10-CM | POA: Diagnosis not present

## 2020-01-02 DIAGNOSIS — I10 Essential (primary) hypertension: Secondary | ICD-10-CM | POA: Diagnosis not present

## 2020-01-02 DIAGNOSIS — R945 Abnormal results of liver function studies: Secondary | ICD-10-CM | POA: Diagnosis not present

## 2020-01-02 DIAGNOSIS — E669 Obesity, unspecified: Secondary | ICD-10-CM | POA: Diagnosis not present

## 2020-01-02 DIAGNOSIS — E782 Mixed hyperlipidemia: Secondary | ICD-10-CM | POA: Diagnosis not present

## 2020-01-02 DIAGNOSIS — R0600 Dyspnea, unspecified: Secondary | ICD-10-CM | POA: Diagnosis not present

## 2020-01-02 DIAGNOSIS — E559 Vitamin D deficiency, unspecified: Secondary | ICD-10-CM | POA: Diagnosis not present

## 2020-01-02 DIAGNOSIS — I509 Heart failure, unspecified: Secondary | ICD-10-CM | POA: Diagnosis not present

## 2020-01-10 DIAGNOSIS — D23111 Other benign neoplasm of skin of right upper eyelid, including canthus: Secondary | ICD-10-CM | POA: Diagnosis not present

## 2020-01-10 DIAGNOSIS — H04123 Dry eye syndrome of bilateral lacrimal glands: Secondary | ICD-10-CM | POA: Diagnosis not present

## 2020-01-10 DIAGNOSIS — H25813 Combined forms of age-related cataract, bilateral: Secondary | ICD-10-CM | POA: Diagnosis not present

## 2020-01-10 DIAGNOSIS — H3402 Transient retinal artery occlusion, left eye: Secondary | ICD-10-CM | POA: Diagnosis not present

## 2020-02-19 DIAGNOSIS — B353 Tinea pedis: Secondary | ICD-10-CM | POA: Diagnosis not present

## 2020-02-25 DIAGNOSIS — I509 Heart failure, unspecified: Secondary | ICD-10-CM | POA: Diagnosis not present

## 2020-03-03 DIAGNOSIS — I509 Heart failure, unspecified: Secondary | ICD-10-CM | POA: Diagnosis not present

## 2020-03-03 DIAGNOSIS — I10 Essential (primary) hypertension: Secondary | ICD-10-CM | POA: Diagnosis not present

## 2020-03-03 DIAGNOSIS — R0600 Dyspnea, unspecified: Secondary | ICD-10-CM | POA: Diagnosis not present

## 2020-03-25 DIAGNOSIS — H6123 Impacted cerumen, bilateral: Secondary | ICD-10-CM | POA: Diagnosis not present

## 2020-03-25 DIAGNOSIS — H6122 Impacted cerumen, left ear: Secondary | ICD-10-CM | POA: Diagnosis not present

## 2020-03-25 DIAGNOSIS — H6121 Impacted cerumen, right ear: Secondary | ICD-10-CM | POA: Diagnosis not present

## 2020-05-12 DIAGNOSIS — E559 Vitamin D deficiency, unspecified: Secondary | ICD-10-CM | POA: Diagnosis not present

## 2020-05-12 DIAGNOSIS — E782 Mixed hyperlipidemia: Secondary | ICD-10-CM | POA: Diagnosis not present

## 2020-05-12 DIAGNOSIS — Z79899 Other long term (current) drug therapy: Secondary | ICD-10-CM | POA: Diagnosis not present

## 2020-05-12 DIAGNOSIS — I10 Essential (primary) hypertension: Secondary | ICD-10-CM | POA: Diagnosis not present

## 2020-06-05 DIAGNOSIS — I10 Essential (primary) hypertension: Secondary | ICD-10-CM | POA: Diagnosis not present

## 2020-06-09 DIAGNOSIS — I10 Essential (primary) hypertension: Secondary | ICD-10-CM | POA: Diagnosis not present

## 2020-06-09 DIAGNOSIS — E669 Obesity, unspecified: Secondary | ICD-10-CM | POA: Diagnosis not present

## 2020-06-09 DIAGNOSIS — I509 Heart failure, unspecified: Secondary | ICD-10-CM | POA: Diagnosis not present

## 2020-07-01 ENCOUNTER — Encounter: Payer: Self-pay | Admitting: Cardiovascular Disease

## 2020-07-01 NOTE — Progress Notes (Signed)
Cardiology Office Note   Date:  07/02/2020   ID:  [name], [name] 09-02-1945, MRN [id number]  PCP:  [name], MD  Cardiologist:   [name], MD   Chief Complaint  Patient presents with  . Hypertension  . Aortic Stenosis   1. Aortic stenosis/ aortic insufficiency-status post Bentall procedure- 2011 2. History of chronic cystitis 3. Hyperlipidemia 4. Dizziness - ? orthostasis. Negative tilt table study. 30 day monitor negative. Occurs with standing or sitting, did not improve wit Midodrine     75 yo with hx of aortic valve disease - s/p Bentall procedure.   He's been having some problems with dizziness. He has seen Dr. Crissie Sickles. He's had a 30 day event monitor which was negative. He had a tilt table study which was negative. He has had episodes of dizziness while standing and with sitting. He usually has several seconds of warning. If he is driving his car he was able to pull over to the side of the road. He has tried Midodrine but does not tolerate it very well. It causes him to be anxious and also causes urinary retention. He also has been eating lots of salt and that helped some.  He complains of generalized fatigue. He does not sleep very well. He has had prostate cancer and has to go to the bathroom at least 3 times a night.  Nov. 6, 2014:   Bronislaw is doing well. He is working part time which involves standing and walking for 8 hours. ( [employer]) No CP, no dyspnea.   May 21, 2014:  Jeff Johnson is a 75 y.o. male who presents for follow up of his Bentall procedure in 2011.   Has recorded his BP.   Readings are mostly ok.  No CP , no dyspnea  Dec. 5, 2016:  Doing ok from a cardiac standpoint.  Having some hip pain . Needs to have cardiac clearance.  Following up with Dr. Prescott Gum for an aortic aneurism.   August 30, 2016:  [name] is seen back for follow up  Was last seen 1.5 years ago Has been having lots of leg ( hip and  knee problems )  He is s/p Bentall procedure in 2011.   Has some dizziness when he stands up  Has shortness of breath when he bends over.  Has had some leg swelling and Dr. Ernie Hew started HCTZ 25 mg a day   Oct. 7, 2019:  Doing well. Not exercising much.   Has knee problems .  Has severe pain in left knee  No CP . Has DOE walking up stairs.   BP is low / normal   December 19, 2018:   Jeff Johnson is seen today for follow-up of his Bentall procedure, hypertension, hyperlipidemia. Wt last year is 262 lbs.   Wt. Is 258 lbs.    He is more short of breath compared to last year.  He has significant shortness of breath climbing stairs.  He also notes that he gets very short of breath when he bends over and then stands back up.  He is never had any actual chest discomfort recently. Needs to have a  R shoulder replacement   Is very deconditioned.   Does not exercise. Walks only 2000 steps a day    May 17, 2019  Jeff Johnson is seen for follow up of his Bentall procedure, HTN, HLS. He was seen in the emergency room for some episodes of shortness of breath.  Had an elevated D-dimer level.  They were not able to do a CT angiogram because of anaphylaxis to the CT contrast.  VQ scan was normal indicating no evidence of pulmonary embolus.   Troponin levels were normal.  Is still having dyspnea, Gets short of breath when he bends over  Previous echocardiogram from 2020 reveals normal left ventricular systolic function.  He has grade 2 diastolic dysfunction.  He has an aortic valve replacement from a previous Bentall procedure.  The mean gradient of the aortic valve is 9 which is within normal limits.  Has lost weight  ( 2 lbs since last visit )   Has fallen ( when the power outage )   July 02, 2020: Jeff Johnson is seen today for follow up of his hx of Bentall procedure ,HTN, obesity Dr. Ernie Hew has been adjusting his BP meds  No CP . Has DOE walking up steps  Needs some ortho surgery , lots of arthritis  pain  Still working , security guard work  HR is slow , no syncope or presyncope   Labs from Dr. Ival Bible  office reveals mildly elevated glucose at 101.  His creatinine is 1.35.  Sodium is 139.  Potassium is 4.2.  Total bilirubin is 1.5.  The remaining liver enzymes are normal.  There was no lipids included in the labs today from Dr. Ival Bible office.   Past Medical History:  Diagnosis Date  . Arthritis   . Benign essential HTN 05/21/2014  . BPH (benign prostatic hyperplasia)   . Chronic cystitis    a. With ongoing hematuria.  . Contrast media allergy   . Diverticulitis    a. 2010: diverticulitis with stricture Sigmoid colectomy with mobilization of the splenic flexure within the end anastomosis.   . Dizziness and giddiness    a. H/o dizziness with reportedly negative tilt table. Holter 2012: NSR, sinus tachy, frequent PVCs, multifocal at times in a trigeminal fashion.  Marland Kitchen Dyspnea on exertion 03/18/2014  . Frequency of urination   . GERD (gastroesophageal reflux disease)   . H/O cardiac catheterization    a. 2011: minor luminal irregularities. b. Nuc 03/2014: ow risk without reversible ischemia or infarction, EF 57%. (There is septal akinesis but otherwise normal wall motion. This is nonspecific.)  . Headache    MIGRAINES  . Hematuria   . History of urinary retention   . Hyperlipidemia 03/18/2014  . Hyponatremia    a. Remote hx hyponatremia felt 2/2 SSRI.  Marland Kitchen Nocturia   . Obesity   . Orthostatic hypotension   . Prostate cancer ([place]) 2011   a. s/p radiation.  . Rash    LEFT KNEE   . 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 Severe aortic stenosis    a. 11/2009: pericardial AVR/Bentall.  . SYNCOPE 02/18/2010   Qualifier: Diagnosis of  By: Lovena Le, MD, Martyn Malay   . Thoracic aortic aneurysm [facility])    a. 11/2009: s/p Magna Ease pericardial tissue valve size 34mm and replacement of fusiform ascending thoracic aneurysm with 30-mm Hemashield cath with hemiarch reconstruction.     Past Surgical History:  Procedure Laterality Date  . BACK SURGERY  1985  . CARPAL TUNNEL RELEASE     BIL WRISTS  . CHOLECYSTECTOMY  2010  . COLON SURGERY  2010   colon resection  . COLONOSCOPY  2008   [facility]  . COLONOSCOPY WITH PROPOFOL N/A 09/02/2017   Procedure: COLONOSCOPY WITH PROPOFOL;  Surgeon: Doran Stabler, MD;  Location: [facility];  Service: Gastroenterology;  Laterality: N/A;  . doppler carotid  2012  . DOPPLER ECHOCARDIOGRAPHY  2011  . POLYPECTOMY  09/02/2017   Procedure: POLYPECTOMY;  Surgeon: Doran Stabler, MD;  Location: [facility];  Service: Gastroenterology;;  . PROSTATE SURGERY  2011   adenocarcinoma w/raddiation   . ROTATOR CUFF REPAIR     X2 LEFT SHOULDER  . THORACIC AORTIC ANEURYSM REPAIR  12/11/09   ASCENDING THORACIC AORTIC ANEURYSM REPAIR  . TISSUE AORTIC VALVE REPLACEMENT  12/11/09  . TOTAL HIP ARTHROPLASTY Left 04/17/2015   Procedure: LEFT TOTAL HIP ARTHROPLASTY ANTERIOR APPROACH;  Surgeon: Rod Can, MD;  Location: [facility] ORS;  Service: Orthopedics;  Laterality: Left;     Current Outpatient Medications  Medication Sig Dispense Refill  . acetaminophen (TYLENOL) 650 MG CR tablet Take 1,300 mg by mouth daily.     . ASPIRIN 81 PO Take 1 tablet by mouth daily.    . Cholecalciferol (VITAMIN D PO) Take 1 tablet by mouth daily.    . hydrochlorothiazide (MICROZIDE) 12.5 MG capsule Take 1 capsule (12.5 mg total) by mouth daily. 90 capsule 3  . lansoprazole (PREVACID) 15 MG capsule Take 15 mg by mouth daily at 12 noon.    . metoprolol tartrate (LOPRESSOR) 25 MG tablet Take 25 mg by mouth 2 (two) times daily.    . montelukast (SINGULAIR) 10 MG tablet Take 10 mg by mouth daily.    . simvastatin (ZOCOR) 20 MG tablet Take 20 mg by mouth daily.    . tamsulosin (FLOMAX) 0.4 MG CAPS capsule Take 0.4 mg by mouth daily.  6  . losartan (COZAAR) 50 MG tablet Take 1 tablet (50 mg total) by mouth daily. 90 tablet 3   No current facility-administered  medications for this visit.    Allergies:   Beta adrenergic blockers, Iodinated diagnostic agents, Shellfish allergy, and Shellfish-derived products    Social History:  The patient  reports that he quit smoking about 53 years ago. His smoking use included cigarettes. He has never used smokeless tobacco. He reports that he does not drink alcohol and does not use drugs.   Family History:  The patient's family history includes Colon cancer in his maternal aunt; Heart attack in his father; Heart disease (age of onset: 30) in his father; Stroke (age of onset: 18) in his mother.    ROS:  Please see the history of present illness.    Physical Exam: Blood pressure 114/60, pulse (!) 56, height 6' (1.829 m), weight 237 lb (107.5 kg), SpO2 98 %.  GEN:  Well nourished, well developed in no acute distress HEENT: Normal NECK: No JVD; No carotid bruits LYMPHATICS: No lymphadenopathy CARDIAC: RRR  , soft systolic murmur  RESPIRATORY:  Clear to auscultation without rales, wheezing or rhonchi  ABDOMEN: Soft, non-tender, non-distended MUSCULOSKELETAL:  No edema; No deformity  SKIN: Warm and dry NEUROLOGIC:  Alert and oriented x 3   EKG: July 02, 2020: Sinus bradycardia at 56.  No ST or T wave changes.   Recent Labs: No results found for requested labs within last 8760 hours.    Lipid Panel    Component Value Date/Time   CHOL 129 02/17/2015 1223   TRIG 163 (H) 02/17/2015 1223   HDL 31 (L) 02/17/2015 1223   CHOLHDL 4.2 02/17/2015 1223   VLDL 33 (H) 02/17/2015 1223   LDLCALC 65 02/17/2015 1223      Wt Readings from Last 3 Encounters:  07/02/20 237 lb (107.5 kg)  05/17/19 256 lb 1.9 oz (116.2 kg)  05/07/19 264 lb 9.6 oz (120 kg)      Other studies Reviewed: Additional studies/ records that were reviewed today include: . Review of the above records demonstrates:    ASSESSMENT AND PLAN:  1. Aortic stenosis/ aortic insufficiency-status post Bentall procedure-.    Jerimie seems  to be doing well from aortic valve replacement standpoint.  He is not having any significant issues.  2.  Dyspnea:   He continues to have shortness of breath with exertion.  I suspect this is from his obesity and his hypertension.  He still eats a lot of fast foods.  I think he also has a component of deconditioning.  I encouraged him to continue with his weight loss.  He is to continue his exercise program.  Fortunately, is not having any angina.   3. Hyperlipidemia -we will check lipids today.  His last LDL is 88.  His goal is between 50 and 70.  4. Dizziness -    5.   Essential Hypertension:     He still eats quite a bit of salt.  He is trying to cut his HCTZ tablet into 3/4 tablets.  I do not think this is good to be very accurate.  We will reduce the HCTZ to 12.5 mg capsules each day.  We will increase the losartan to 50 mg a day.  I encouraged him to stay away from salt and to avoid fast foods.  We will follow-up with Dr. Ernie Hew as well.        Signed, [name], MD  07/02/2020 9:16 AM    [facility] [address], [place], [place]  [address] Phone: [phone]; Fax: [phone]

## 2020-07-02 ENCOUNTER — Encounter: Payer: Self-pay | Admitting: Cardiovascular Disease

## 2020-07-02 ENCOUNTER — Ambulatory Visit: Payer: Medicare Other | Admitting: Cardiovascular Disease

## 2020-07-02 ENCOUNTER — Other Ambulatory Visit: Payer: Self-pay

## 2020-07-02 VITALS — BP 114/60 | HR 56 | Ht 72.0 in | Wt 237.0 lb

## 2020-07-02 DIAGNOSIS — Z952 Presence of prosthetic heart valve: Secondary | ICD-10-CM

## 2020-07-02 LAB — LIPID PANEL
Chol/HDL Ratio: 3.6 ratio (ref 0.0–5.0)
Cholesterol, Total: 151 mg/dL (ref 100–199)
HDL: 42 mg/dL (ref >39)
LDL Chol Calc (NIH): 85 mg/dL (ref 0–99)
Triglycerides: 134 mg/dL (ref 0–149)
VLDL Cholesterol Cal: 24 mg/dL (ref 5–40)

## 2020-07-02 MED ORDER — LOSARTAN POTASSIUM 50 MG PO TABS: 50.0000 mg | ORAL_TABLET | Freq: Every day | ORAL | 3 refills | Status: DC

## 2020-07-02 MED ORDER — HYDROCHLOROTHIAZIDE 12.5 MG PO CAPS: 12.5000 mg | ORAL_CAPSULE | Freq: Every day | ORAL | 3 refills | Status: DC

## 2020-07-02 NOTE — Patient Instructions (Signed)
Medication Instructions:  Your physician has recommended you make the following change in your medication:   HCTZ 12.5mg  daily  Losartan 50mg  daily   *If you need a refill on your cardiac medications before your next appointment, please call your pharmacy*   Lab Work: TODAY: Lipids If you have labs (blood work) drawn today and your tests are completely normal, you will receive your results only by: Marland Kitchen MyChart Message (if you have MyChart) OR . A paper copy in the mail If you have any lab test that is abnormal or we need to change your treatment, we will call you to review the results.   Testing/Procedures: none   Follow-Up: At [facility], you and your health needs are our priority.  As part of our continuing mission to provide you with exceptional heart care, we have created designated Provider Care Teams.  These Care Teams include your primary Cardiologist (physician) and Advanced Practice Providers (APPs -  Physician Assistants and Nurse Practitioners) who all work together to provide you with the care you need, when you need it.   Your next appointment:   1 year(s)  The format for your next appointment:   In Person  Provider:   You may see [name], MD or one of the following Advanced Practice Providers on your designated Care Team:    Richardson Dopp, PA-C  [place], [place]

## 2020-07-06 DIAGNOSIS — M25512 Pain in left shoulder: Secondary | ICD-10-CM | POA: Diagnosis not present

## 2020-07-07 DIAGNOSIS — H04123 Dry eye syndrome of bilateral lacrimal glands: Secondary | ICD-10-CM | POA: Diagnosis not present

## 2020-07-07 DIAGNOSIS — H25813 Combined forms of age-related cataract, bilateral: Secondary | ICD-10-CM | POA: Diagnosis not present

## 2020-07-07 DIAGNOSIS — H3402 Transient retinal artery occlusion, left eye: Secondary | ICD-10-CM | POA: Diagnosis not present

## 2020-07-07 DIAGNOSIS — D23111 Other benign neoplasm of skin of right upper eyelid, including canthus: Secondary | ICD-10-CM | POA: Diagnosis not present

## 2020-07-10 DIAGNOSIS — R7303 Prediabetes: Secondary | ICD-10-CM | POA: Diagnosis not present

## 2020-07-10 DIAGNOSIS — J069 Acute upper respiratory infection, unspecified: Secondary | ICD-10-CM | POA: Diagnosis not present

## 2020-07-10 DIAGNOSIS — I10 Essential (primary) hypertension: Secondary | ICD-10-CM | POA: Diagnosis not present

## 2020-07-10 DIAGNOSIS — R739 Hyperglycemia, unspecified: Secondary | ICD-10-CM | POA: Diagnosis not present

## 2020-07-16 DIAGNOSIS — M19212 Secondary osteoarthritis, left shoulder: Secondary | ICD-10-CM | POA: Diagnosis not present

## 2020-10-03 DIAGNOSIS — I10 Essential (primary) hypertension: Secondary | ICD-10-CM | POA: Diagnosis not present

## 2020-10-03 DIAGNOSIS — E782 Mixed hyperlipidemia: Secondary | ICD-10-CM | POA: Diagnosis not present

## 2020-10-07 DIAGNOSIS — E782 Mixed hyperlipidemia: Secondary | ICD-10-CM | POA: Diagnosis not present

## 2020-10-07 DIAGNOSIS — E669 Obesity, unspecified: Secondary | ICD-10-CM | POA: Diagnosis not present

## 2020-10-07 DIAGNOSIS — R7303 Prediabetes: Secondary | ICD-10-CM | POA: Diagnosis not present

## 2020-10-07 DIAGNOSIS — I10 Essential (primary) hypertension: Secondary | ICD-10-CM | POA: Diagnosis not present

## 2020-11-13 DIAGNOSIS — Z974 Presence of external hearing-aid: Secondary | ICD-10-CM | POA: Diagnosis not present

## 2020-11-13 DIAGNOSIS — H6123 Impacted cerumen, bilateral: Secondary | ICD-10-CM | POA: Insufficient documentation

## 2020-11-13 DIAGNOSIS — H903 Sensorineural hearing loss, bilateral: Secondary | ICD-10-CM | POA: Insufficient documentation

## 2020-11-28 DIAGNOSIS — M19211 Secondary osteoarthritis, right shoulder: Secondary | ICD-10-CM | POA: Diagnosis not present

## 2020-11-28 DIAGNOSIS — M19212 Secondary osteoarthritis, left shoulder: Secondary | ICD-10-CM | POA: Diagnosis not present

## 2020-12-01 DIAGNOSIS — H903 Sensorineural hearing loss, bilateral: Secondary | ICD-10-CM | POA: Diagnosis not present

## 2021-01-02 DIAGNOSIS — Z20822 Contact with and (suspected) exposure to covid-19: Secondary | ICD-10-CM | POA: Diagnosis not present

## 2021-01-02 DIAGNOSIS — J069 Acute upper respiratory infection, unspecified: Secondary | ICD-10-CM | POA: Diagnosis not present

## 2021-01-22 ENCOUNTER — Telehealth: Payer: Self-pay | Admitting: Cardiovascular Disease

## 2021-01-22 DIAGNOSIS — Z952 Presence of prosthetic heart valve: Secondary | ICD-10-CM

## 2021-01-22 NOTE — Telephone Encounter (Signed)
Attempted phone call tp pt's home and mobile phone number.  Advised to contact triage at [phone].

## 2021-01-22 NOTE — Telephone Encounter (Signed)
Pt c/o Shortness Of Breath: STAT if SOB developed within the last 24 hours or pt is noticeably SOB on the phone  1. Are you currently SOB (can you hear that pt is SOB on the phone)?  No   2. How long have you been experiencing SOB?  Patient states he is always SOB, but it has become progressively worse over the past few months  3. Are you SOB when sitting or when up moving around?  When up and moving around  4. Are you currently experiencing any other symptoms?  No

## 2021-01-23 NOTE — Telephone Encounter (Signed)
Spoke with pt and is having SOB with little activity can get out of car and is SOB and climbing stairs is very hard  Per pt has some good days but also notes fatigue even after getting 6-8 hours of sleep Pt has appt 01/28/21 at 10:20 am Will forward to Dr Acie Fredrickson for review and recommendations B/P  running 90-133/50-74 and HR 52

## 2021-01-26 NOTE — Telephone Encounter (Signed)
Spoke with pt and advised per Dr Acie Fredrickson pt will need echo and then f/u appointment with Dr Acie Fredrickson or APP.  Pt advised will forward to scheduler and he will be contacted re: appointments.  Pt verbalizes understanding and agrees with current plan.

## 2021-01-26 NOTE — Telephone Encounter (Signed)
Attempted phone call to pt. To notify of dr Elmarie Shiley recommendation:  Lets get an echo to follow up with his prosthetic AV  With an OV with me or APP following the echo   PN      Unable to leave voicemail message as no voicemail.  Echo order placed and request sent to [facility] to schedule OV.

## 2021-01-28 ENCOUNTER — Ambulatory Visit: Payer: Medicare Other | Admitting: Cardiovascular Disease

## 2021-02-04 NOTE — Telephone Encounter (Signed)
Pt to see Dr Acie Fredrickson on 12/6 .cy

## 2021-02-13 ENCOUNTER — Other Ambulatory Visit: Payer: Self-pay

## 2021-02-13 ENCOUNTER — Ambulatory Visit (HOSPITAL_COMMUNITY): Payer: Medicare Other | Attending: Internal Medicine

## 2021-02-13 DIAGNOSIS — Z952 Presence of prosthetic heart valve: Secondary | ICD-10-CM | POA: Insufficient documentation

## 2021-02-13 LAB — ECHOCARDIOGRAM COMPLETE
AR max vel: 3.14 cm2
AV Area VTI: 3.28 cm2
AV Area mean vel: 3.09 cm2
AV Mean grad: 7.3 mmHg
AV Peak grad: 15.3 mmHg
Ao pk vel: 1.95 m/s
Area-P 1/2: 2.99 cm2
S' Lateral: 2.7 cm

## 2021-02-13 MED ORDER — PERFLUTREN LIPID MICROSPHERE
1.0000 mL | INTRAVENOUS | Status: AC | PRN
  Administered 2021-02-13: 3 mL via INTRAVENOUS

## 2021-02-16 ENCOUNTER — Encounter: Payer: Self-pay | Admitting: Cardiovascular Disease

## 2021-02-16 NOTE — Progress Notes (Signed)
Cardiology Office Note   Date:  02/17/2021   ID:  Jeff Johnson, Jeff Johnson 01-29-1946, MRN [id number]  PCP:  [name], MD  Cardiologist:   [name], MD   Chief Complaint  Patient presents with   Shortness of Breath   Hypertension        1. 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 History of chronic cystitis 3. Hyperlipidemia 4. Dizziness - ? orthostasis.  Negative tilt table study.  30 day monitor negative.  Occurs with standing or sitting, did not improve wit Midodrine     75 yo with hx of aortic valve disease - s/p Bentall procedure.    He's been having some problems with dizziness. He has seen Dr. Crissie Sickles. He's had a 30 day event monitor which was negative. He had a tilt table study which was negative. He has had episodes of dizziness while standing and with sitting. He usually has several seconds of warning. If he is driving his car he was able to pull over to the side of the road.  He has tried Midodrine but does not tolerate it very well.  It causes him to be anxious and also causes urinary retention. He also has been eating lots of salt and that helped some.  He complains of generalized fatigue.  He does not sleep very well.   He has had prostate cancer and has to go to the bathroom at least 3 times a night.  Nov. 6, 2014:    Jeff Johnson is doing well.  He is working part time which involves standing and walking for 8 hours.   ( [employer])  No CP, no dyspnea.   May 21, 2014:  [name] is a 75 y.o. male who presents for follow up of his Bentall procedure in 2011.   Has recorded his BP.   Readings are mostly ok.  No CP , no dyspnea  Dec. 5, 2016:  Doing ok from a cardiac standpoint.  Having some hip pain . Needs to have cardiac clearance.  Following up with Dr. Prescott Gum for an aortic aneurism.   August 30, 2016:  Jeff Johnson is seen back for follow up  Was last seen 1.5 years ago Has been having lots of leg (  hip and knee problems )  He is s/p Bentall procedure in 2011.   Has some dizziness when he stands up  Has shortness of breath when he bends over.  Has had some leg swelling and Dr. Ernie Hew started HCTZ 25 mg a day   Oct. 7, 2019:  Doing well. Not exercising much.   Has knee problems .  Has severe pain in left knee  No CP . Has DOE walking up stairs.   BP is low / normal   December 19, 2018:   Jeff Johnson is seen today for follow-up of his Bentall procedure, hypertension, hyperlipidemia. Wt last year is 262 lbs.   Wt. Is 258 lbs.    He is more short of breath compared to last year.  He has significant shortness of breath climbing stairs.  He also notes that he gets very short of breath when he bends over and then stands back up.  He is never had any actual chest discomfort recently. Needs to have a  R shoulder replacement   Is very deconditioned.   Does not exercise. Walks only 2000 steps a day    May 17, 2019  Jeff Johnson is seen for  follow up of his Bentall procedure, HTN, HLS. He was seen in the emergency room for some episodes of shortness of breath.  Had an elevated D-dimer level.  They were not able to do a CT angiogram because of anaphylaxis to the CT contrast.  VQ scan was normal indicating no evidence of pulmonary embolus.   Troponin levels were normal.  Is still having dyspnea, Gets short of breath when he bends over  Previous echocardiogram from 2020 reveals normal left ventricular systolic function.  He has grade 2 diastolic dysfunction.  He has an aortic valve replacement from a previous Bentall procedure.  The mean gradient of the aortic valve is 9 which is within normal limits.  Has lost weight  ( 2 lbs since last visit )   Has fallen ( when the power outage )   July 02, 2020: Jeff Johnson is seen today for follow up of his hx of Bentall procedure ,HTN, obesity Dr. Ernie Hew has been adjusting his BP meds  No CP . Has DOE walking up steps  Needs some ortho surgery , lots of  arthritis pain  Still working , security guard work  HR is slow , no syncope or presyncope   Labs from Dr. Ival Bible  office reveals mildly elevated glucose at 101.  His creatinine is 1.35.  Sodium is 139.  Potassium is 4.2.  Total bilirubin is 1.5.  The remaining liver enzymes are normal.  There was no lipids included in the labs today from Dr. Ival Bible office.   Dec. 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  Still limited in his walking  Has worked his way up to 3400 steps a day  He has been having more dyspnea  Has been working lots of hours  -  Occasional chest discomfort - usually with mental stress.  Will get a lexiscan myoviw to evaluate his DOE.  This could be an angina equivalent  .  Past Medical History:  Diagnosis Date   Arthritis    Benign essential HTN 05/21/2014   BPH (benign prostatic hyperplasia)    Chronic cystitis    a. With ongoing hematuria.   Contrast media allergy    Diverticulitis    a. 2010: diverticulitis with stricture Sigmoid colectomy with mobilization of the splenic flexure within the end anastomosis.    Dizziness and giddiness    a. H/o dizziness with reportedly negative tilt table. Holter 2012: NSR, sinus tachy, frequent PVCs, multifocal at times in a trigeminal fashion.   Dyspnea on exertion 03/18/2014   Frequency of urination    GERD (gastroesophageal reflux disease)    H/O cardiac catheterization    a. 2011: minor luminal irregularities. b. Nuc 03/2014: ow risk without reversible ischemia or infarction, EF 57%. (There is septal akinesis but otherwise normal wall motion. This is nonspecific.)   Headache    MIGRAINES   Hematuria    History of urinary retention    Hyperlipidemia 03/18/2014   Hyponatremia    a. Remote hx hyponatremia felt 2/2 SSRI.   Nocturia    Obesity     Orthostatic hypotension    Prostate cancer ([place]) 2011   a. s/p radiation.   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  11/2009: pericardial AVR/Bentall.   SYNCOPE 02/18/2010   Qualifier: Diagnosis of  By: Lovena Le, MD, Martyn Malay    Thoracic aortic aneurysm    a. 11/2009: s/p Magna Ease pericardial tissue valve size 72mm and replacement of fusiform ascending thoracic aneurysm with 30-mm Hemashield cath with hemiarch reconstruction.    Past Surgical History:  Procedure Laterality Date   BACK SURGERY  1985   CARPAL TUNNEL RELEASE     BIL WRISTS   CHOLECYSTECTOMY  2010   COLON SURGERY  2010   colon resection   COLONOSCOPY  2008   [facility]   COLONOSCOPY WITH PROPOFOL N/A 09/02/2017   Procedure: COLONOSCOPY WITH PROPOFOL;  Surgeon: Doran Stabler, MD;  Location: [facility];  Service: Gastroenterology;  Laterality: N/A;   doppler carotid  2012   DOPPLER ECHOCARDIOGRAPHY  2011   POLYPECTOMY  09/02/2017   Procedure: POLYPECTOMY;  Surgeon: Doran Stabler, MD;  Location: [facility];  Service: Gastroenterology;;   PROSTATE SURGERY  2011   adenocarcinoma w/raddiation    ROTATOR CUFF REPAIR     X2 LEFT SHOULDER   THORACIC AORTIC ANEURYSM REPAIR  12/11/09   ASCENDING THORACIC AORTIC ANEURYSM REPAIR   TISSUE AORTIC VALVE REPLACEMENT  12/11/09   TOTAL HIP ARTHROPLASTY Left 04/17/2015   Procedure: LEFT TOTAL HIP ARTHROPLASTY ANTERIOR APPROACH;  Surgeon: Rod Can, MD;  Location: [facility];  Service: Orthopedics;  Laterality: Left;     Current Outpatient Medications  Medication Sig Dispense Refill   acetaminophen (TYLENOL) 650 MG CR tablet Take 1,300 mg by mouth daily.      ASPIRIN 81 PO Take 1 tablet by mouth daily.     Cholecalciferol (VITAMIN D PO) Take 1 tablet by mouth daily.     hydrochlorothiazide (MICROZIDE) 12.5 MG capsule Take 1 capsule (12.5 mg total) by mouth daily. 90 capsule 3   lansoprazole (PREVACID) 15 MG  capsule Take 15 mg by mouth daily at 12 noon.     losartan (COZAAR) 50 MG tablet Take 1 tablet (50 mg total) by mouth daily. 90 tablet 3   metoprolol tartrate (LOPRESSOR) 25 MG tablet Take 25 mg by mouth daily.     montelukast (SINGULAIR) 10 MG tablet Take 10 mg by mouth daily.     simvastatin (ZOCOR) 20 MG tablet Take 20 mg by mouth daily.     tamsulosin (FLOMAX) 0.4 MG CAPS capsule Take 0.4 mg by mouth daily.  6   No current facility-administered medications for this visit.    Allergies:   Beta adrenergic blockers, Iodinated diagnostic agents, Shellfish allergy, and Shellfish-derived products    Social History:  The patient  reports that he quit smoking about 53 years ago. His smoking use included cigarettes. He has never used smokeless tobacco. He reports that he does not drink alcohol and does not use drugs.   Family History:  The patient's family history includes Colon cancer in his maternal aunt; Heart attack in his father; Heart disease (age of onset: 41) in his father; Stroke (age of onset: 91) in his mother.    ROS:  Please see the history of present illness.    Physical Exam: Blood pressure 118/68, pulse 60, height 6' (1.829 m), weight 240 lb (108.9 kg), SpO2 98 %.  GEN:  eldelry male, moderately obese.  HEENT: Normal NECK: No JVD; No carotid bruits LYMPHATICS: No lymphadenopathy CARDIAC: RRR   RESPIRATORY:  Clear to auscultation without rales, wheezing or rhonchi  ABDOMEN: Soft, non-tender, non-distended MUSCULOSKELETAL:  No  edema; No deformity  SKIN: Warm and dry NEUROLOGIC:  Alert and oriented x 3   EKG: February 17, 2021: Normal sinus rhythm at 60.  First-degree AV block.  No ST or T wave changes.   Recent Labs: No results found for requested labs within last 8760 hours.    Lipid Panel    Component Value Date/Time   CHOL 151 07/02/2020 0918   TRIG 134 07/02/2020 0918   HDL 42 07/02/2020 0918   CHOLHDL 3.6 07/02/2020 0918   CHOLHDL 4.2 02/17/2015 1223    VLDL 33 (H) 02/17/2015 1223   LDLCALC 85 07/02/2020 0918      Wt Readings from Last 3 Encounters:  02/17/21 240 lb (108.9 kg)  07/02/20 237 lb (107.5 kg)  05/17/19 256 lb 1.9 oz (116.2 kg)      Other studies Reviewed: Additional studies/ records that were reviewed today include: . Review of the above records demonstrates:    ASSESSMENT AND PLAN:  1. Aortic stenosis/ aortic insufficiency-status post Bentall procedure-  .    His valve sounds good  2.  Dyspnea:   He continues to have shortness of breath with exertion.  I suspect this is from his obesity and his hypertension.  He still eats a lot of fast foods.    Recent echocardiogram shows that the valve is functioning normally.  I do not think that his shortness of breath is related to aortic valve dysfunction.  He did not have coronary artery disease at the time of his AVR but may have developed some coronary artery disease since.  We will schedule him for a Lexiscan Myoview study.   3. Hyperlipidemia -his last lipids look fairly stable.  4. Dizziness -    5.   Essential Hypertension:      Blood pressure is fairly well controlled.  Continue losartan.        Signed, [name], MD  02/17/2021 11:46 AM    [address], [place], [place]  [address] Phone: [phone]; Fax: [phone]

## 2021-02-17 ENCOUNTER — Encounter: Payer: Self-pay | Admitting: *Deleted

## 2021-02-17 ENCOUNTER — Ambulatory Visit: Payer: Medicare Other | Admitting: Cardiovascular Disease

## 2021-02-17 ENCOUNTER — Other Ambulatory Visit: Payer: Self-pay

## 2021-02-17 ENCOUNTER — Encounter: Payer: Self-pay | Admitting: Cardiovascular Disease

## 2021-02-17 VITALS — BP 118/68 | HR 60 | Ht 72.0 in | Wt 240.0 lb

## 2021-02-17 DIAGNOSIS — E782 Mixed hyperlipidemia: Secondary | ICD-10-CM

## 2021-02-17 DIAGNOSIS — R0609 Other forms of dyspnea: Secondary | ICD-10-CM

## 2021-02-17 DIAGNOSIS — I10 Essential (primary) hypertension: Secondary | ICD-10-CM | POA: Diagnosis not present

## 2021-02-17 NOTE — Patient Instructions (Signed)
Medication Instructions:  Your physician recommends that you continue on your current medications as directed. Please refer to the Current Medication list given to you today.  *If you need a refill on your cardiac medications before your next appointment, please call your pharmacy*   Lab Work: None If you have labs (blood work) drawn today and your tests are completely normal, you will receive your results only by: [place] (if you have MyChart) OR A paper copy in the mail If you have any lab test that is abnormal or we need to change your treatment, we will call you to review the results.   Testing/Procedures: Your physician has requested that you have a lexiscan myoview. For further information please visit [email or web address]. Please follow instruction sheet, as given.   Follow-Up: At [facility], you and your health needs are our priority.  As part of our continuing mission to provide you with exceptional heart care, we have created designated Provider Care Teams.  These Care Teams include your primary Cardiologist (physician) and Advanced Practice Providers (APPs -  Physician Assistants and Nurse Practitioners) who all work together to provide you with the care you need, when you need it.  We recommend signing up for the patient portal called "MyChart".  Sign up information is provided on this After Visit Summary.  MyChart is used to connect with patients for Virtual Visits (Telemedicine).  Patients are able to view lab/test results, encounter notes, upcoming appointments, etc.  Non-urgent messages can be sent to your provider as well.   To learn more about what you can do with MyChart, go to [email or web address].    Your next appointment:   6 month(s)  The format for your next appointment:   In Person  Provider:   [name], MD     Other Instructions

## 2021-03-13 ENCOUNTER — Telehealth (HOSPITAL_COMMUNITY): Payer: Self-pay | Admitting: *Deleted

## 2021-03-13 NOTE — Telephone Encounter (Signed)
Patient given detailed instructions per Myocardial Perfusion Study Information Sheet for the test on 03/20/2021 at 7:45. Patient notified to arrive 15 minutes early and that it is imperative to arrive on time for appointment to keep from having the test rescheduled.  If you need to cancel or reschedule your appointment, please call the office within 24 hours of your appointment. . Patient verbalized understanding.Jeff Johnson

## 2021-03-13 NOTE — Telephone Encounter (Signed)
err

## 2021-03-16 ENCOUNTER — Emergency Department (HOSPITAL_BASED_OUTPATIENT_CLINIC_OR_DEPARTMENT_OTHER): Payer: Medicare Other | Admitting: Radiology

## 2021-03-16 ENCOUNTER — Other Ambulatory Visit: Payer: Self-pay

## 2021-03-16 ENCOUNTER — Encounter (HOSPITAL_BASED_OUTPATIENT_CLINIC_OR_DEPARTMENT_OTHER): Payer: Self-pay | Admitting: Emergency Medicine

## 2021-03-16 ENCOUNTER — Emergency Department (HOSPITAL_BASED_OUTPATIENT_CLINIC_OR_DEPARTMENT_OTHER)
Admission: EM | Admit: 2021-03-16 | Discharge: 2021-03-16 | Disposition: A | Discharge status: Home / Self Care | Payer: Medicare Other | Attending: Emergency Medicine | Admitting: Emergency Medicine

## 2021-03-16 DIAGNOSIS — R0602 Shortness of breath: Secondary | ICD-10-CM | POA: Diagnosis not present

## 2021-03-16 DIAGNOSIS — Z79899 Other long term (current) drug therapy: Secondary | ICD-10-CM | POA: Diagnosis not present

## 2021-03-16 DIAGNOSIS — Z7982 Long term (current) use of aspirin: Secondary | ICD-10-CM | POA: Diagnosis not present

## 2021-03-16 DIAGNOSIS — I10 Essential (primary) hypertension: Secondary | ICD-10-CM | POA: Insufficient documentation

## 2021-03-16 DIAGNOSIS — J069 Acute upper respiratory infection, unspecified: Secondary | ICD-10-CM | POA: Diagnosis not present

## 2021-03-16 DIAGNOSIS — Z20822 Contact with and (suspected) exposure to covid-19: Secondary | ICD-10-CM | POA: Diagnosis not present

## 2021-03-16 DIAGNOSIS — R0981 Nasal congestion: Secondary | ICD-10-CM | POA: Diagnosis present

## 2021-03-16 LAB — CBC WITH DIFFERENTIAL/PLATELET
Abs Immature Granulocytes: 0.01 10*3/uL (ref 0.00–0.07)
Basophils Absolute: 0 10*3/uL (ref 0.0–0.1)
Basophils Relative: 1 %
Eosinophils Absolute: 0.2 10*3/uL (ref 0.0–0.5)
Eosinophils Relative: 3 %
HCT: 41.1 % (ref 39.0–52.0)
Hemoglobin: 14.1 g/dL (ref 13.0–17.0)
Immature Granulocytes: 0 %
Lymphocytes Relative: 18 %
Lymphs Abs: 1.5 10*3/uL (ref 0.7–4.0)
MCH: 30.4 pg (ref 26.0–34.0)
MCHC: 34.3 g/dL (ref 30.0–36.0)
MCV: 88.6 fL (ref 80.0–100.0)
Monocytes Absolute: 0.7 10*3/uL (ref 0.1–1.0)
Monocytes Relative: 9 %
Neutro Abs: 5.7 10*3/uL (ref 1.7–7.7)
Neutrophils Relative %: 69 %
Platelets: 154 10*3/uL (ref 150–400)
RBC: 4.64 MIL/uL (ref 4.22–5.81)
RDW: 12.6 % (ref 11.5–15.5)
WBC: 8.1 10*3/uL (ref 4.0–10.5)
nRBC: 0 % (ref 0.0–0.2)

## 2021-03-16 LAB — COMPREHENSIVE METABOLIC PANEL
ALT: 11 U/L (ref 0–44)
AST: 17 U/L (ref 15–41)
Albumin: 4.3 g/dL (ref 3.5–5.0)
Alkaline Phosphatase: 49 U/L (ref 38–126)
Anion gap: 10 (ref 5–15)
BUN: 16 mg/dL (ref 8–23)
CO2: 26 mmol/L (ref 22–32)
Calcium: 9.4 mg/dL (ref 8.9–10.3)
Chloride: 101 mmol/L (ref 98–111)
Creatinine, Ser: 1.07 mg/dL (ref 0.61–1.24)
GFR, Estimated: >60 mL/min (ref >60)
Glucose, Bld: 107 mg/dL — ABNORMAL HIGH (ref 70–99)
Potassium: 3.9 mmol/L (ref 3.5–5.1)
Sodium: 137 mmol/L (ref 135–145)
Total Bilirubin: 2.7 mg/dL — ABNORMAL HIGH (ref 0.3–1.2)
Total Protein: 7.1 g/dL (ref 6.5–8.1)

## 2021-03-16 LAB — RESP PANEL BY RT-PCR (FLU A&B, COVID) ARPGX2
Influenza A by PCR: NEGATIVE
Influenza B by PCR: NEGATIVE
SARS Coronavirus 2 by RT PCR: NEGATIVE

## 2021-03-16 LAB — TROPONIN I (HIGH SENSITIVITY): Troponin I (High Sensitivity): 6 ng/L (ref <18)

## 2021-03-16 LAB — BRAIN NATRIURETIC PEPTIDE: B Natriuretic Peptide: 133.1 pg/mL — ABNORMAL HIGH (ref 0.0–100.0)

## 2021-03-16 NOTE — ED Triage Notes (Signed)
Pt arrives to ED with c/o nasal congestion. Associated symptoms include productive cough, SOB, headache. These started x4 days ago.

## 2021-03-16 NOTE — ED Notes (Signed)
Up to bathroom w/cane, no assist needed from RN

## 2021-03-16 NOTE — ED Provider Notes (Signed)
[facility] EMERGENCY DEPT Provider Note   CSN: [id number] Arrival date & time: 03/16/21  2458     History  Chief Complaint  Patient presents with   Nasal Congestion    Jeff Johnson is a 76 y.o. male.  HPI  76 year old male with a medical history significant for orthostatic hypotension, severe aortic stenosis, status post Bentall procedure in 2011, thoracic aortic aneurysm, obesity, BPH, GERD, HTN, HLD who presents to the emergency department with roughly 4 days of URI symptoms.  Patient states that he has had nasal congestion, productive cough, shortness of breath and a mild headache for the past 4 days.  He denies any active chest pain.  He denies any current shortness of breath.  He states that he is always short of breath at baseline, worse with exertion.  He denies any fevers or chills.  Home Medications Prior to Admission medications   Medication Sig Start Date End Date Taking? Authorizing Provider  acetaminophen (TYLENOL) 650 MG CR tablet Take 1,300 mg by mouth daily.     [provider]  ASPIRIN 81 PO Take 1 tablet by mouth daily.    [provider]  Cholecalciferol (VITAMIN D PO) Take 1 tablet by mouth daily.    [provider]  hydrochlorothiazide (MICROZIDE) 12.5 MG capsule Take 1 capsule (12.5 mg total) by mouth daily. 07/02/20   Nahser, Wonda Cheng, MD  lansoprazole (PREVACID) 15 MG capsule Take 15 mg by mouth daily at 12 noon.    [provider]  losartan (COZAAR) 50 MG tablet Take 1 tablet (50 mg total) by mouth daily. 07/02/20   Nahser, Wonda Cheng, MD  metoprolol tartrate (LOPRESSOR) 25 MG tablet Take 25 mg by mouth daily. 05/08/19   [provider]  montelukast (SINGULAIR) 10 MG tablet Take 10 mg by mouth daily.    [provider]  simvastatin (ZOCOR) 20 MG tablet Take 20 mg by mouth daily.    [provider]  tamsulosin (FLOMAX) 0.4 MG CAPS capsule Take 0.4 mg by mouth daily. 08/23/17   [provider]      Allergies    Beta adrenergic blockers, Iodinated contrast media, Shellfish allergy, and Shellfish-derived products    Review of Systems   Review of Systems  Physical Exam Updated Vital Signs BP 128/83 (BP Location: Right Arm)    Pulse (!) 57    Temp 98.4 F (36.9 C) (Oral)    Resp 18    Ht 6' (1.829 m)    Wt 108.9 kg    SpO2 99%    BMI 32.56 kg/m  Physical Exam Vitals and nursing note reviewed.  Constitutional:      General: He is not in acute distress.    Appearance: He is well-developed.  HENT:     Head: Normocephalic and atraumatic.  Eyes:     Conjunctiva/sclera: Conjunctivae normal.     Pupils: Pupils are equal, round, and reactive to light.  Cardiovascular:     Rate and Rhythm: Normal rate and regular rhythm.  Pulmonary:     Effort: Pulmonary effort is normal. No respiratory distress.     Breath sounds: Normal breath sounds.  Abdominal:     General: There is no distension.     Palpations: Abdomen is soft.     Tenderness: There is no abdominal tenderness. There is no guarding.  Musculoskeletal:        General: No swelling, deformity or signs of injury.     Cervical back: Neck  supple.  Skin:    General: Skin is warm and dry.     Capillary Refill: Capillary refill takes less than 2 seconds.     Findings: No lesion or rash.  Neurological:     General: No focal deficit present.     Mental Status: He is alert. Mental status is at baseline.  Psychiatric:        Mood and Affect: Mood normal.    ED Results / Procedures / Treatments   Labs (all labs ordered are listed, but only abnormal results are displayed) Labs Reviewed  COMPREHENSIVE METABOLIC PANEL - Abnormal; Notable for the following components:      Result Value   Glucose, Bld 107 (*)    Total Bilirubin 2.7 (*)    All other components within normal limits  BRAIN NATRIURETIC PEPTIDE - Abnormal; Notable for the following components:   B Natriuretic Peptide 133.1 (*)    All other  components within normal limits  RESP PANEL BY RT-PCR (FLU A&B, COVID) ARPGX2  CBC WITH DIFFERENTIAL/PLATELET  TROPONIN I (HIGH SENSITIVITY)    EKG EKG Interpretation  Date/Time:  Monday March 16 2021 08:52:48 EST Ventricular Rate:  57 PR Interval:    QRS Duration: 94 QT Interval:  451 QTC Calculation: 440 R Axis:   -60 Text Interpretation: Atrial fibrillation Left axis deviation Confirmed by Regan Lemming ([id number]) on 03/16/2021 9:00:26 AM  Radiology DG Chest 2 View  Result Date: 03/16/2021 CLINICAL DATA:  Shortness of breath. EXAM: CHEST - 2 VIEW COMPARISON:  May 07, 2019. FINDINGS: The heart size and mediastinal contours are within normal limits. Both lungs are clear. Status post aortic valve repair. The visualized skeletal structures are unremarkable. IMPRESSION: No active cardiopulmonary disease. Electronically Signed   By: Marijo Conception M.D.   On: 03/16/2021 08:51    Procedures Procedures    Medications Ordered in ED Medications - No data to display  ED Course/ Medical Decision Making/ A&P                           Medical Decision Making   76 year old male with a medical history significant for orthostatic hypotension, severe aortic stenosis, status post Bentall procedure in 2011, thoracic aortic aneurysm, obesity, BPH, GERD, HTN, HLD who presents to the emergency department with roughly 4 days of URI symptoms.  Patient states that he has had nasal congestion, productive cough, shortness of breath and a mild headache for the past 4 days.  He denies any active chest pain.  He denies any current shortness of breath.  He states that he is always short of breath at baseline, worse with exertion.  He denies any fevers or chills.  On arrival, the patient was afebrile, hemodynamically stable, atrial fibrillation rate controlled on cardiac telemetry, saturating well on room air.  Patient's physical exam was generally unremarkable.  The patient is overall well-appearing and  tolerating oral intake.  He presents with several days of viral URI symptoms.  Given his multiple extensive medical comorbidities, further work-up was initiated to evaluate for possible bacterial pneumonia, COVID-19 or influenza infection, cardiac abnormalities.  EKG revealed atrial fibrillation, ventricular rate mildly bradycardic.  No ischemic changes noted.  COVID-19 and influenza PCR testing resulted negative.  The patient had a mildly nonspecifically elevated BNP to 133.  He has no rales on exam.  CBC was without leukocytosis, anemia or platelet abnormality and initial troponin was 6.  The patient has had no symptoms  of chest pain and symptoms of shortness of breath been ongoing for the last 4 days with a productive cough, I do not think delta troponin testing is warranted at this time.  Chest x-ray was performed which I independently reviewed and revealed no acute cardiac or pulmonary abnormality.  I agree with radiology interpretation.  Given the lack of leukocytosis, fever, chest x-ray with no focal findings suspect bacterial pneumonia, negative COVID-19 and influenza PCR, favor likely other viral URI as the etiology of the patient's presentation.  He is overall well-appearing and tolerating oral intake.  Overall stable for discharge with outpatient PCP follow-up.     Final Clinical Impression(s) / ED Diagnoses Final diagnoses:  Upper respiratory tract infection, unspecified type    Rx / DC Orders ED Discharge Orders     None         Regan Lemming, MD 03/18/21 [phone]

## 2021-03-16 NOTE — Discharge Instructions (Addendum)
You were evaluated in the Emergency Department and after careful evaluation, we did not find any emergent condition requiring admission or further testing in the hospital.  Your exam/testing today was overall reassuring.  Your EKG did not reveal acute changes.  Your chest x-ray did not reveal evidence of a bacterial pneumonia or other acute abnormality.  Your screening laboratory work-up was also very reassuring.  Your cardiac enzyme was normal, your BNP was only mildly elevated and your lungs are clear to auscultation bilaterally.  I am not concerned for acute heart failure exacerbation at this time.  Your CBC did not reveal evidence of an elevated white count and you have no evidence of an acute anemia.  Your COVID-19 and influenza PCR testing was negative.  At this time, your symptoms are most consistent with a likely viral upper respiratory infection which should resolve over the next few days.  Please return to the Emergency Department if you experience any worsening of your condition.  Thank you for allowing [place] to be a part of your care.

## 2021-03-20 ENCOUNTER — Ambulatory Visit (HOSPITAL_COMMUNITY): Payer: Medicare Other

## 2021-04-03 DIAGNOSIS — Z125 Encounter for screening for malignant neoplasm of prostate: Secondary | ICD-10-CM | POA: Diagnosis not present

## 2021-04-03 DIAGNOSIS — N401 Enlarged prostate with lower urinary tract symptoms: Secondary | ICD-10-CM | POA: Diagnosis not present

## 2021-04-03 DIAGNOSIS — R7303 Prediabetes: Secondary | ICD-10-CM | POA: Diagnosis not present

## 2021-04-23 ENCOUNTER — Ambulatory Visit (INDEPENDENT_AMBULATORY_CARE_PROVIDER_SITE_OTHER): Payer: Medicare Other | Admitting: Family Medicine

## 2021-04-23 ENCOUNTER — Encounter (HOSPITAL_BASED_OUTPATIENT_CLINIC_OR_DEPARTMENT_OTHER): Payer: Self-pay | Admitting: Family Medicine

## 2021-04-23 ENCOUNTER — Other Ambulatory Visit: Payer: Self-pay

## 2021-04-23 VITALS — BP 100/60 | HR 53 | Ht 72.0 in | Wt 236.0 lb

## 2021-04-23 DIAGNOSIS — I10 Essential (primary) hypertension: Secondary | ICD-10-CM

## 2021-04-23 DIAGNOSIS — E559 Vitamin D deficiency, unspecified: Secondary | ICD-10-CM | POA: Diagnosis not present

## 2021-04-23 DIAGNOSIS — Z0000 Encounter for general adult medical examination without abnormal findings: Secondary | ICD-10-CM

## 2021-04-23 NOTE — Progress Notes (Signed)
New Patient Office Visit  Subjective:  Patient ID: Jeff Johnson, male    DOB: 06-19-1945  Age: 76 y.o. MRN: [id number]  CC:  Chief Complaint  Patient presents with   Establish Care    Prior PCP - Dr. Ernie Hew. Dr. Johnsie Cancel is cardiologist. Patient has no acute issues or concerns today. He is still working 50-60 hours a week. He states he has general joint pains but has for years    HPI Jeff Johnson is a 76 year old male presenting to establish in clinic.  He has no concerns today.  He does have an extensive medical and surgical history, outlined below.  Does follow with cardiology related to hypertension, aortic stenosis.  Relatively recent echocardiogram showed normal LV function, mild MR, normal functioning prosthetic aortic valve.  He has been seeing them recently for evaluation of dizziness.  Had normal tilt table study.  They did recommend [place] study which patient had to postpone due to birth in great grand child recently. Current medications include hydrochlorothiazide, losartan, metoprolol  Hyperlipidemia: Patient currently taking simvastatin, no issues with this medication, no side effect such as myalgia  Patient also with vitamin D listed on his medication list due to reported vitamin D deficiency in the past  Does have some ongoing joint pain which he reports is due to rotator cuff issues as well as arthritis.  Primary joints involved at the present pain include right shoulder and left knee  Patient is currently still very active, does work about 60 hours/week at [employer].  Patient is a former Company secretary.  He lives with his wife locally.  Has been married about 16 years  Past Medical History:  Diagnosis Date   Arthritis    Benign essential HTN 05/21/2014   BPH (benign prostatic hyperplasia)    Chronic cystitis    a. With ongoing hematuria.   Contrast media allergy    Diverticulitis    a. 2010: diverticulitis with stricture Sigmoid colectomy with mobilization of  the splenic flexure within the end anastomosis.    Dizziness and giddiness    a. H/o dizziness with reportedly negative tilt table. Holter 2012: NSR, sinus tachy, frequent PVCs, multifocal at times in a trigeminal fashion.   Dyspnea on exertion 03/18/2014   Frequency of urination    GERD (gastroesophageal reflux disease)    H/O cardiac catheterization    a. 2011: minor luminal irregularities. b. Nuc 03/2014: ow risk without reversible ischemia or infarction, EF 57%. (There is septal akinesis but otherwise normal wall motion. This is nonspecific.)   Headache    MIGRAINES   Hematuria    History of urinary retention    Hyperlipidemia 03/18/2014   Hyponatremia    a. Remote hx hyponatremia felt 2/2 SSRI.   Nocturia    Obesity    Orthostatic hypotension    Prostate cancer ([place]) 2011   a. s/p radiation.   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 11/2009: pericardial AVR/[place].   SYNCOPE 02/18/2010   Qualifier: Diagnosis of  By: Lovena Le, MD, Martyn Malay    Thoracic aortic aneurysm    a. 11/2009: s/p Magna Ease pericardial tissue valve size 36mm and replacement of fusiform ascending thoracic aneurysm with 30-mm Hemashield cath with hemiarch reconstruction.    Past Surgical History:  Procedure Laterality Date   BACK SURGERY  1985   CARPAL TUNNEL RELEASE     BIL  WRISTS   CHOLECYSTECTOMY  2010   COLON SURGERY  2010   colon resection   COLONOSCOPY  2008   [facility]   COLONOSCOPY WITH PROPOFOL N/A 09/02/2017   Procedure: COLONOSCOPY WITH PROPOFOL;  Surgeon: Doran Stabler, MD;  Location: [facility];  Service: Gastroenterology;  Laterality: N/A;   doppler carotid  2012   DOPPLER ECHOCARDIOGRAPHY  2011   POLYPECTOMY  09/02/2017   Procedure: POLYPECTOMY;  Surgeon: Doran Stabler, MD;  Location: [facility];  Service: Gastroenterology;;   PROSTATE SURGERY  2011   adenocarcinoma w/raddiation    ROTATOR CUFF REPAIR     X2 LEFT  SHOULDER   THORACIC AORTIC ANEURYSM REPAIR  12/11/09   ASCENDING THORACIC AORTIC ANEURYSM REPAIR   TISSUE AORTIC VALVE REPLACEMENT  12/11/09   TOTAL HIP ARTHROPLASTY Left 04/17/2015   Procedure: LEFT TOTAL HIP ARTHROPLASTY ANTERIOR APPROACH;  Surgeon: Rod Can, MD;  Location: [facility];  Service: Orthopedics;  Laterality: Left;    Family History  Problem Relation Age of Onset   Stroke Mother 63   Heart disease Father 71   Heart attack Father    Colon cancer Maternal Aunt    Stomach cancer Neg Hx    Esophageal cancer Neg Hx     Social History   Socioeconomic History   Marital status: Married    Spouse name: Not on file   Number of children: Not on file   Years of education: Not on file   Highest education level: Not on file  Occupational History   Not on file  Tobacco Use   Smoking status: Former    Types: Cigarettes    Quit date: 03/16/1967    Years since quitting: 54.1   Smokeless tobacco: Never  Vaping Use   Vaping Use: Never used  Substance and Sexual Activity   Alcohol use: No   Drug use: No   Sexual activity: Not on file  Other Topics Concern   Not on file  Social History Narrative   Not on file   Social Determinants of Health   Financial Resource Strain: Not on file  Food Insecurity: Not on file  Transportation Needs: Not on file  Physical Activity: Not on file  Stress: Not on file  Social Connections: Not on file  Intimate Partner Violence: Not on file    Objective:   Today's Vitals: BP 100/60    Pulse (!) 53    Ht 6' (1.829 m)    Wt 236 lb (107 kg)    SpO2 96%    BMI 32.01 kg/m   Physical Exam  76 year old male in no acute distress Cardiovascular exam with regular rate and rhythm Lungs clear to auscultation bilaterally  Assessment & Plan:   Problem List Items Addressed This Visit       Cardiovascular and Mediastinum   Essential hypertension - Primary    Follows with cardiology, current medications include losartan, metoprolol,  hydrochlorothiazide.  No change in medications today Recommend close follow-up with cardiology regarding ongoing dizziness.  Possibly related to somewhat low blood pressure and may need adjustment in medications      Relevant Orders   Comprehensive metabolic panel   VITAMIN D 25 Hydroxy (Vit-D Deficiency, Fractures)   Hemoglobin A1c     Other   Vitamin D deficiency    Noted on chart review and patient with vitamin D listed as medication on his medication list Plan to check vitamin D level with upcoming labs in order to assess  current status      Other Visit Diagnoses     Wellness examination       Relevant Orders   CBC with Differential/Platelet   Comprehensive metabolic panel   Lipid panel   TSH Rfx on Abnormal to Free T4   Hemoglobin A1c       Outpatient Encounter Medications as of 04/23/2021  Medication Sig   acetaminophen (TYLENOL) 650 MG CR tablet Take 1,300 mg by mouth daily.    ASPIRIN 81 PO Take 1 tablet by mouth daily.   Cholecalciferol (VITAMIN D PO) Take 1 tablet by mouth daily.   hydrochlorothiazide (MICROZIDE) 12.5 MG capsule Take 1 capsule (12.5 mg total) by mouth daily.   lansoprazole (PREVACID) 15 MG capsule Take 15 mg by mouth daily at 12 noon.   losartan (COZAAR) 50 MG tablet Take 1 tablet (50 mg total) by mouth daily.   metoprolol tartrate (LOPRESSOR) 25 MG tablet Take 25 mg by mouth daily.   montelukast (SINGULAIR) 10 MG tablet Take 10 mg by mouth daily.   simvastatin (ZOCOR) 20 MG tablet Take 20 mg by mouth daily.   tamsulosin (FLOMAX) 0.4 MG CAPS capsule Take 0.4 mg by mouth daily.   No facility-administered encounter medications on file as of 04/23/2021.    Follow-up: Return in about 6 months (around 10/21/2021) for Follow Up.  Plan for follow-up in about 6 months for CPE, will complete labs shortly before that appointment and review at next office visit  Raeqwon Lux J De [place], MD

## 2021-04-23 NOTE — Assessment & Plan Note (Signed)
Follows with cardiology, current medications include losartan, metoprolol, hydrochlorothiazide.  No change in medications today Recommend close follow-up with cardiology regarding ongoing dizziness.  Possibly related to somewhat low blood pressure and may need adjustment in medications

## 2021-04-23 NOTE — Patient Instructions (Signed)
°  Medication Instructions:  Your physician recommends that you continue on your current medications as directed. Please refer to the Current Medication list given to you today. --If you need a refill on any your medications before your next appointment, please call your pharmacy first. If no refills are authorized on file call the office.--  Follow-Up: Your next appointment:   Your physician recommends that you schedule a follow-up appointment in: 78 MONTHS for CPE with Dr. de [place]  You will receive a text message or e-mail with a link to a survey about your care and experience with [place] today! We would greatly appreciate your feedback!   Thanks for letting us be apart of your health journey!!  Primary Care and Sports Medicine   Dr. Arlina Robes [place]   We encourage you to activate your patient portal called "MyChart".  Sign up information is provided on this After Visit Summary.  MyChart is used to connect with patients for Virtual Visits (Telemedicine).  Patients are able to view lab/test results, encounter notes, upcoming appointments, etc.  Non-urgent messages can be sent to your provider as well. To learn more about what you can do with MyChart, please visit --  [email or web address].

## 2021-04-23 NOTE — Assessment & Plan Note (Signed)
Noted on chart review and patient with vitamin D listed as medication on his medication list Plan to check vitamin D level with upcoming labs in order to assess current status

## 2021-04-28 ENCOUNTER — Other Ambulatory Visit (HOSPITAL_BASED_OUTPATIENT_CLINIC_OR_DEPARTMENT_OTHER): Payer: Self-pay | Admitting: Family Medicine

## 2021-05-08 ENCOUNTER — Ambulatory Visit (HOSPITAL_BASED_OUTPATIENT_CLINIC_OR_DEPARTMENT_OTHER): Payer: Medicare Other | Admitting: Family Medicine

## 2021-05-11 ENCOUNTER — Other Ambulatory Visit: Payer: Self-pay | Admitting: Cardiovascular Disease

## 2021-05-18 ENCOUNTER — Encounter (HOSPITAL_COMMUNITY): Payer: Medicare Other

## 2021-05-18 ENCOUNTER — Encounter (HOSPITAL_COMMUNITY): Payer: Self-pay

## 2021-05-26 ENCOUNTER — Other Ambulatory Visit (HOSPITAL_BASED_OUTPATIENT_CLINIC_OR_DEPARTMENT_OTHER): Payer: Self-pay

## 2021-05-26 MED FILL — Metoprolol Tartrate Tab 25 MG: ORAL | 90 days supply | Qty: 90 | Fill #0 | Status: CN

## 2021-05-27 ENCOUNTER — Other Ambulatory Visit (HOSPITAL_BASED_OUTPATIENT_CLINIC_OR_DEPARTMENT_OTHER): Payer: Self-pay

## 2021-05-28 ENCOUNTER — Other Ambulatory Visit (HOSPITAL_BASED_OUTPATIENT_CLINIC_OR_DEPARTMENT_OTHER): Payer: Self-pay

## 2021-05-29 ENCOUNTER — Other Ambulatory Visit (HOSPITAL_BASED_OUTPATIENT_CLINIC_OR_DEPARTMENT_OTHER): Payer: Self-pay

## 2021-05-29 ENCOUNTER — Telehealth (HOSPITAL_BASED_OUTPATIENT_CLINIC_OR_DEPARTMENT_OTHER): Payer: Self-pay

## 2021-05-29 DIAGNOSIS — I71019 Dissection of thoracic aorta, unspecified: Secondary | ICD-10-CM

## 2021-05-29 MED ORDER — MONTELUKAST SODIUM 10 MG PO TABS
10.0000 mg | ORAL_TABLET | Freq: Every day | ORAL | 3 refills | Status: DC
  Filled 2021-05-29: qty 90, 90d supply, fill #0
  Filled 2021-09-01: qty 30, 30d supply, fill #1

## 2021-05-29 MED ORDER — TAMSULOSIN HCL 0.4 MG PO CAPS
0.4000 mg | ORAL_CAPSULE | Freq: Every day | ORAL | 6 refills | Status: DC
  Filled 2021-05-29 – 2021-06-15 (×2): qty 30, 30d supply, fill #0
  Filled 2021-07-08: qty 30, 30d supply, fill #1
  Filled 2021-08-12: qty 90, 90d supply, fill #2
  Filled 2021-11-03: qty 60, 60d supply, fill #3

## 2021-05-29 MED ORDER — LANSOPRAZOLE 15 MG PO CPDR
15.0000 mg | DELAYED_RELEASE_CAPSULE | Freq: Every day | ORAL | 3 refills | Status: DC
  Filled 2021-05-29: qty 90, 90d supply, fill #0
  Filled 2021-09-01: qty 30, 30d supply, fill #1

## 2021-05-29 MED ORDER — SIMVASTATIN 20 MG PO TABS
20.0000 mg | ORAL_TABLET | Freq: Every day | ORAL | 3 refills | Status: DC
  Filled 2021-05-29: qty 90, 90d supply, fill #0

## 2021-06-15 ENCOUNTER — Other Ambulatory Visit (HOSPITAL_BASED_OUTPATIENT_CLINIC_OR_DEPARTMENT_OTHER): Payer: Self-pay

## 2021-07-08 ENCOUNTER — Other Ambulatory Visit (HOSPITAL_BASED_OUTPATIENT_CLINIC_OR_DEPARTMENT_OTHER): Payer: Self-pay

## 2021-07-08 DIAGNOSIS — H5319 Other subjective visual disturbances: Secondary | ICD-10-CM | POA: Diagnosis not present

## 2021-07-08 DIAGNOSIS — H04123 Dry eye syndrome of bilateral lacrimal glands: Secondary | ICD-10-CM | POA: Diagnosis not present

## 2021-07-08 DIAGNOSIS — H25813 Combined forms of age-related cataract, bilateral: Secondary | ICD-10-CM | POA: Diagnosis not present

## 2021-07-08 DIAGNOSIS — H35363 Drusen (degenerative) of macula, bilateral: Secondary | ICD-10-CM | POA: Diagnosis not present

## 2021-07-16 ENCOUNTER — Ambulatory Visit (HOSPITAL_BASED_OUTPATIENT_CLINIC_OR_DEPARTMENT_OTHER): Payer: Medicare Other | Admitting: Family Medicine

## 2021-07-23 ENCOUNTER — Ambulatory Visit (INDEPENDENT_AMBULATORY_CARE_PROVIDER_SITE_OTHER): Payer: Medicare Other | Admitting: Family Medicine

## 2021-07-23 ENCOUNTER — Encounter (HOSPITAL_BASED_OUTPATIENT_CLINIC_OR_DEPARTMENT_OTHER): Payer: Self-pay | Admitting: Family Medicine

## 2021-07-23 DIAGNOSIS — Z0000 Encounter for general adult medical examination without abnormal findings: Secondary | ICD-10-CM | POA: Diagnosis not present

## 2021-07-23 NOTE — Progress Notes (Signed)
Annual Wellness Visit     Patient: Jeff Johnson, Male    DOB: 1945-05-02, 76 y.o.   MRN: [id number]  Subjective  Chief Complaint  Patient presents with   Medicare Wellness    Jeff Johnson is a 76 y.o. male who presents today for his Annual Wellness Visit. He reports consuming a general diet. Home exercise routine includes walking 1 hrs per days. He generally feels fairly well. He reports sleeping fairly well. He does not have additional problems to discuss today.    Medications: Outpatient Medications Prior to Visit  Medication Sig   acetaminophen (TYLENOL) 650 MG CR tablet Take 1,300 mg by mouth daily.    ASPIRIN 81 PO Take 1 tablet by mouth daily.   Cholecalciferol (VITAMIN D PO) Take 1 tablet by mouth daily.   hydrochlorothiazide (MICROZIDE) 12.5 MG capsule TAKE 1 CAPSULE (12.5 MG) BY MOUTH DAILY   lansoprazole (PREVACID) 15 MG capsule Take 1 capsule (15 mg total) by mouth daily at 12 noon.   losartan (COZAAR) 25 MG tablet TAKE 1 TABLET BY MOUTH DAILY FOR BLOOD PRESSURE   metoprolol tartrate (LOPRESSOR) 25 MG tablet Take 0.5 tablets (12.5 mg total) by mouth 2 (two) times daily.   montelukast (SINGULAIR) 10 MG tablet Take 1 tablet (10 mg total) by mouth daily.   simvastatin (ZOCOR) 20 MG tablet Take 1 tablet (20 mg total) by mouth daily.   tamsulosin (FLOMAX) 0.4 MG CAPS capsule Take 1 capsule (0.4 mg total) by mouth daily.   [DISCONTINUED] losartan (COZAAR) 50 MG tablet Take 1 tablet (50 mg total) by mouth daily.   No facility-administered medications prior to visit.    Allergies  Allergen Reactions   Beta Adrenergic Blockers Other (See Comments)    Remote reportedh/o intolerance due to weakness per notes.Marland KitchenMarland KitchenMarland KitchenCalled patient. Advised patient of provider's approval for requested procedure, as well as any comments/instructions from provider.    Iodinated Contrast Media Anaphylaxis   Shellfish Allergy Anaphylaxis and Other (See Comments)   Shellfish-Derived Products  Anaphylaxis    Patient Care Team: de [place], Blondell Reveal, MD as PCP - General (Family Medicine) Nahser, Wonda Cheng, MD as PCP - Cardiology (Cardiology) [name], MD (Family Medicine)   Objective  BP 134/83   Pulse 91   Temp 97.8 F (36.6 C) (Oral)   Ht 6' (1.829 m)   Wt 236 lb (107 kg)   SpO2 98%   BMI 32.01 kg/m    Most recent functional status assessment:    07/23/2021    2:09 PM  In your present state of health, do you have any difficulty performing the following activities:  Hearing? 0  Vision? 1  Difficulty concentrating or making decisions? 0  Walking or climbing stairs? 0  Dressing or bathing? 0  Doing errands, shopping? 0   Most recent fall risk assessment:    07/23/2021    2:27 PM  Fall Risk   Falls in the past year? 1  Number falls in past yr: 1  Injury with Fall? 0  Risk for fall due to : History of fall(s);Impaired balance/gait;Impaired mobility  Follow up Falls evaluation completed;Education provided;Falls prevention discussed    Most recent depression screenings:    07/23/2021    2:09 PM  PHQ 2/9 Scores  PHQ - 2 Score 0  Exception Documentation Medical reason   Most recent cognitive screening:     View : No data to display.         Most recent Audit-C  alcohol use screening     View : No data to display.         A score of 3 or more in women, and 4 or more in men indicates increased risk for alcohol abuse, EXCEPT if all of the points are from question 1   Vision/Hearing Screen: No results found.  No results found for any visits on 07/23/21.    Assessment & Plan   Annual wellness visit done today including the all of the following: Reviewed patient's Family Medical History Reviewed and updated list of patient's medical providers Assessment of cognitive impairment was done Assessed patient's functional ability Established a written schedule for health screening [place] Completed and  Reviewed  Exercise Activities and Dietary recommendations  Goals   None     Immunization History  Administered Date(s) Administered   Influenza-Unspecified 11/13/2013, 12/14/2014, 01/13/2018   PFIZER(Purple Top)SARS-COV-2 Vaccination 03/26/2019, 04/15/2019    Health Maintenance  Topic Date Due   Hepatitis C Screening  Never done   Zoster Vaccines- Shingrix (1 of 2) Never done   Pneumonia Vaccine 76+ Years old (1 - PCV) Never done   COVID-19 Vaccine (3 - Pfizer risk series) 05/13/2019   TETANUS/TDAP  09/27/2021   INFLUENZA VACCINE  10/13/2021   HPV VACCINES  Aged Out   COLONOSCOPY (Pts 45-88yr Insurance coverage will need to be confirmed)  Discontinued   Discussed health benefits of physical activity, and encouraged him to engage in regular exercise appropriate for his age and condition.    Problem List Items Addressed This Visit       Other   Medicare annual wellness visit, subsequent    Return in about 6 months (around 01/23/2022) for HTN.     Jeff Bango J De [place] MD

## 2021-08-07 DIAGNOSIS — Z0000 Encounter for general adult medical examination without abnormal findings: Secondary | ICD-10-CM | POA: Insufficient documentation

## 2021-08-12 ENCOUNTER — Other Ambulatory Visit (HOSPITAL_BASED_OUTPATIENT_CLINIC_OR_DEPARTMENT_OTHER): Payer: Self-pay

## 2021-08-12 MED FILL — Metoprolol Tartrate Tab 25 MG: ORAL | 90 days supply | Qty: 90 | Fill #0 | Status: AC

## 2021-08-12 MED FILL — Losartan Potassium Tab 25 MG: ORAL | 90 days supply | Qty: 90 | Fill #0 | Status: AC

## 2021-08-18 ENCOUNTER — Encounter: Payer: Self-pay | Admitting: Cardiovascular Disease

## 2021-08-18 ENCOUNTER — Ambulatory Visit: Payer: Medicare Other | Admitting: Cardiovascular Disease

## 2021-08-18 VITALS — BP 108/60 | HR 50 | Ht 72.0 in | Wt 243.0 lb

## 2021-08-18 DIAGNOSIS — Z952 Presence of prosthetic heart valve: Secondary | ICD-10-CM

## 2021-08-18 DIAGNOSIS — R0609 Other forms of dyspnea: Secondary | ICD-10-CM

## 2021-08-18 NOTE — Progress Notes (Signed)
Cardiology Office Note   Date:  08/18/2021   ID:  Jeff Johnson, Jeff Johnson 1945/06/17, MRN [id number]  PCP:  de Johnson, Jeff J, MD  Cardiologist:   Jeff Moores, MD   Chief Complaint  Patient presents with   Hypertension        1. 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 History of chronic cystitis 3. Hyperlipidemia 4. Dizziness - ? orthostasis.  Negative tilt table study.  30 day monitor negative.  Occurs with standing or sitting, did not improve wit Midodrine     76 yo with hx of aortic valve disease - s/p Bentall procedure.    He's been having some problems with dizziness. He has seen Dr. Crissie Johnson. He's had a 30 day event monitor which was negative. He had a tilt table study which was negative. He has had episodes of dizziness while standing and with sitting. He usually has several seconds of warning. If he is driving his car he was able to pull over to the side of the road.  He has tried Midodrine but does not tolerate it very well.  It causes him to be anxious and also causes urinary retention. He also has been eating lots of salt and that helped some.  He complains of generalized fatigue.  He does not sleep very well.   He has had prostate cancer and has to go to the bathroom at least 3 times a night.  Nov. 6, 2014:    Jeff Johnson is doing well.  He is working part time which involves standing and walking for 8 hours.   ( Jeff Johnson)  No CP, no dyspnea.   May 21, 2014:  Jeff Johnson is a 76 y.o. male who presents for follow up of his Bentall procedure in 2011.   Has recorded his BP.   Readings are mostly ok.  No CP , no dyspnea  Dec. 5, 2016:  Doing ok from a cardiac standpoint.  Having some hip pain . Needs to have cardiac clearance.  Following up with Dr. Prescott Johnson for an aortic aneurism.   August 30, 2016:  Jeff Johnson is seen back for follow up  Was last seen 1.5 years ago Has been having lots of leg ( hip and knee problems )   He is s/p Bentall procedure in 2011.   Has some dizziness when he stands up  Has shortness of breath when he bends over.  Has had some leg swelling and Dr. Ernie Johnson started HCTZ 25 mg a day   Oct. 7, 2019:  Doing well. Not exercising much.   Has knee problems .  Has severe pain in left knee  No CP . Has DOE walking up stairs.   BP is low / normal   December 19, 2018:   Jeff Johnson is seen today for follow-up of his Bentall procedure, hypertension, hyperlipidemia. Wt last year is 262 lbs.   Wt. Is 258 lbs.    He is more short of breath compared to last year.  He has significant shortness of breath climbing stairs.  He also notes that he gets very short of breath when he bends over and then stands back up.  He is never had any actual chest discomfort recently. Needs to have a  R shoulder replacement   Is very deconditioned.   Does not exercise. Walks only 2000 steps a day    May 17, 2019  Jeff Johnson is seen for follow up of his  Bentall procedure, HTN, HLS. He was seen in the emergency room for some episodes of shortness of breath.  Had an elevated D-dimer level.  They were not able to do a CT angiogram because of anaphylaxis to the CT contrast.  VQ scan was normal indicating no evidence of pulmonary embolus.   Troponin levels were normal.  Is still having dyspnea, Gets short of breath when he bends over  Previous echocardiogram from 2020 reveals normal left ventricular systolic function.  He has grade 2 diastolic dysfunction.  He has an aortic valve replacement from a previous Bentall procedure.  The mean gradient of the aortic valve is 9 which is within normal limits.  Has lost weight  ( 2 lbs since last visit )   Has fallen ( when the power outage )   July 02, 2020: Jeff Johnson is seen today for follow up of his hx of Bentall procedure ,HTN, obesity Dr. Ernie Johnson has been adjusting his BP meds  No CP . Has DOE walking up steps  Needs some ortho surgery , lots of arthritis pain  Still  working , security guard work  HR is slow , no syncope or presyncope   Labs from Dr. Ival Johnson  office reveals mildly elevated glucose at 101.  His creatinine is 1.35.  Sodium is 139.  Potassium is 4.2.  Total bilirubin is 1.5.  The remaining liver enzymes are normal.  There was no lipids included in the labs today from Dr. Ival Johnson office.   Dec. 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  Still limited in his walking  Has worked his way up to 3400 steps a day  He has been having more dyspnea  Has been working lots of hours  -  Occasional chest discomfort - usually with mental stress.  Will get a lexiscan myoviw to evaluate his DOE.  This could be an angina equivalent  .  August 18, 2021: Jeff Johnson  is seen today for follow-up of his Bentall procedure, hypertension, obesity, deconditioning. He was having some episodes of chest pain when I saw him last December.  Myoview study was ordered but he has not had that done yet.  Still having DOE with climbing steps. Echo  ( 12/22) shows normal LV function with normal functioning AV.     Past Medical History:  Diagnosis Date   Arthritis    Benign essential HTN 05/21/2014   BPH (benign prostatic hyperplasia)    Chronic cystitis    a. With ongoing hematuria.   Contrast media allergy    Diverticulitis    a. 2010: diverticulitis with stricture Sigmoid colectomy with mobilization of the splenic flexure within the end anastomosis.    Dizziness and giddiness    a. H/o dizziness with reportedly negative tilt table. Holter 2012: NSR, sinus tachy, frequent PVCs, multifocal at times in a trigeminal fashion.   Dyspnea on exertion 03/18/2014   Frequency of urination    GERD (gastroesophageal reflux disease)    H/O cardiac catheterization    a. 2011: minor luminal irregularities. b.  Nuc 03/2014: ow risk without reversible ischemia or infarction, EF 57%. (There is septal akinesis but otherwise normal wall motion. This is nonspecific.)   Headache    MIGRAINES   Hematuria    History of urinary retention    Hyperlipidemia 03/18/2014  Hyponatremia    a. Remote hx hyponatremia felt 2/2 SSRI.   Nocturia    Obesity    Orthostatic hypotension    Prostate cancer ([place]) 2011   a. s/p radiation.   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 11/2009: pericardial AVR/Bentall.   SYNCOPE 02/18/2010   Qualifier: Diagnosis of  By: Lovena Le, MD, Martyn Malay    Thoracic aortic aneurysm [facility])    a. 11/2009: s/p Magna Ease pericardial tissue valve size 41m and replacement of fusiform ascending thoracic aneurysm with 30-mm Hemashield cath with hemiarch reconstruction.    Past Surgical History:  Procedure Laterality Date   BACK SURGERY  1985   CARPAL TUNNEL RELEASE     BIL WRISTS   CHOLECYSTECTOMY  2010   COLON SURGERY  2010   colon resection   COLONOSCOPY  2008   [facility]   COLONOSCOPY WITH PROPOFOL N/A 09/02/2017   Procedure: COLONOSCOPY WITH PROPOFOL;  Surgeon: DDoran Stabler MD;  Location: [facility];  Service: Gastroenterology;  Laterality: N/A;   doppler carotid  2012   DOPPLER ECHOCARDIOGRAPHY  2011   POLYPECTOMY  09/02/2017   Procedure: POLYPECTOMY;  Surgeon: DDoran Stabler MD;  Location: [facility];  Service: Gastroenterology;;   PROSTATE SURGERY  2011   adenocarcinoma w/raddiation    ROTATOR CUFF REPAIR     X2 LEFT SHOULDER   THORACIC AORTIC ANEURYSM REPAIR  12/11/09   ASCENDING THORACIC AORTIC ANEURYSM REPAIR   TISSUE AORTIC VALVE REPLACEMENT  12/11/09   TOTAL HIP ARTHROPLASTY Left 04/17/2015   Procedure: LEFT TOTAL HIP ARTHROPLASTY ANTERIOR APPROACH;  Surgeon: BRod Can MD;  Location: [facility];  Service: Orthopedics;  Laterality: Left;     Current Outpatient Medications  Medication Sig  Dispense Refill   acetaminophen (TYLENOL) 650 MG CR tablet Take 1,300 mg by mouth daily.      ASPIRIN 81 PO Take 1 tablet by mouth daily.     Cholecalciferol (VITAMIN D PO) Take 1 tablet by mouth daily.     hydrochlorothiazide (MICROZIDE) 12.5 MG capsule TAKE 1 CAPSULE (12.5 MG) BY MOUTH DAILY 90 capsule 3   lansoprazole (PREVACID) 15 MG capsule Take 1 capsule (15 mg total) by mouth daily at 12 noon. 30 capsule 3   losartan (COZAAR) 25 MG tablet TAKE 1 TABLET BY MOUTH DAILY FOR BLOOD PRESSURE 90 tablet 1   metoprolol tartrate (LOPRESSOR) 25 MG tablet Take 0.5 tablets (12.5 mg total) by mouth 2 (two) times daily. 90 tablet 1   montelukast (SINGULAIR) 10 MG tablet Take 1 tablet (10 mg total) by mouth daily. 30 tablet 3   simvastatin (ZOCOR) 20 MG tablet Take 1 tablet (20 mg total) by mouth daily. 30 tablet 3   tamsulosin (FLOMAX) 0.4 MG CAPS capsule Take 1 capsule (0.4 mg total) by mouth daily. 30 capsule 6   No current facility-administered medications for this visit.    Allergies:   Beta adrenergic blockers, Iodinated contrast media, Shellfish allergy, and Shellfish-derived products    Social History:  The patient  reports that he quit smoking about 54 years ago. His smoking use included cigarettes. He has never used smokeless tobacco. He reports that he does not drink alcohol and does not use drugs.   Family History:  The patient's family history includes Colon cancer in his maternal aunt; Heart attack in his father; Heart disease (age of onset: 476 in his  father; Stroke (age of onset: 43) in his mother.    ROS:  Please see the history of present illness.    Physical Exam: Blood pressure 108/60, pulse (!) 50, height 6' (1.829 m), weight 243 lb (110.2 kg), SpO2 97 %.  GEN:  Well nourished, well developed in no acute distress HEENT: Normal NECK: No JVD; No carotid bruits LYMPHATICS: No lymphadenopathy CARDIAC: RRR , accentuated S2.  RESPIRATORY:  Clear to auscultation without rales,  wheezing or rhonchi  ABDOMEN: Soft, non-tender, non-distended MUSCULOSKELETAL:  No edema; No deformity  SKIN: Warm and dry NEUROLOGIC:  Alert and oriented x 3   EKG:     Recent Labs: 03/16/2021: ALT 11; B Natriuretic Peptide 133.1; BUN 16; Creatinine, Ser 1.07; Hemoglobin 14.1; Platelets 154; Potassium 3.9; Sodium 137    Lipid Panel    Component Value Date/Time   CHOL 151 07/02/2020 0918   TRIG 134 07/02/2020 0918   HDL 42 07/02/2020 0918   CHOLHDL 3.6 07/02/2020 0918   CHOLHDL 4.2 02/17/2015 1223   VLDL 33 (H) 02/17/2015 1223   LDLCALC 85 07/02/2020 0918      Wt Readings from Last 3 Encounters:  08/18/21 243 lb (110.2 kg)  07/23/21 236 lb (107 kg)  04/23/21 236 lb (107 kg)      Other studies Reviewed: Additional studies/ records that were reviewed today include: . Review of the above records demonstrates:    ASSESSMENT AND PLAN:  1. Aortic stenosis/ aortic insufficiency-status post Bentall procedure-  .    Aortic valve sound good     2.  Dyspnea:    prosthetic valve looks good by echo.  Normal LV function . Encouraged weight loss  He does not get much exercise.  I encouraged him to try to work on weight loss and getting more exercise.  At this point his cardiac function and prosthetic valve seem to be functioning normally.   3. Hyperlipidemia -   4. Dizziness -    5.   Essential Hypertension:     Blood pressure looks good.    We will have him see an APP in 1 year.      Signed, Jeff Moores, MD  08/18/2021 10:45 AM    [address], [place], [place]  [address] Phone: [phone]; Fax: [phone]

## 2021-08-18 NOTE — Patient Instructions (Signed)
Medication Instructions:  Your physician recommends that you continue on your current medications as directed. Please refer to the Current Medication list given to you today.  *If you need a refill on your cardiac medications before your next appointment, please call your pharmacy*   Lab Work: NONE If you have labs (blood work) drawn today and your tests are completely normal, you will receive your results only by: [place] (if you have MyChart) OR A paper copy in the mail If you have any lab test that is abnormal or we need to change your treatment, we will call you to review the results.   Testing/Procedures: NONE   Follow-Up: At [facility], you and your health needs are our priority.  As part of our continuing mission to provide you with exceptional heart care, we have created designated Provider Care Teams.  These Care Teams include your primary Cardiologist (physician) and Advanced Practice Providers (APPs -  Physician Assistants and Nurse Practitioners) who all work together to provide you with the care you need, when you need it.  We recommend signing up for the patient portal called "MyChart".  Sign up information is provided on this After Visit Summary.  MyChart is used to connect with patients for Virtual Visits (Telemedicine).  Patients are able to view lab/test results, encounter notes, upcoming appointments, etc.  Non-urgent messages can be sent to your provider as well.   To learn more about what you can do with MyChart, go to [email or web address].    Your next appointment:   1 year(s)  The format for your next appointment:   In Person  Provider:   [name], MD  Richardson Dopp, PA Christen Bame, NP   Important Information About [place]

## 2021-08-21 ENCOUNTER — Ambulatory Visit (INDEPENDENT_AMBULATORY_CARE_PROVIDER_SITE_OTHER): Payer: Medicare Other | Admitting: Family Medicine

## 2021-08-21 ENCOUNTER — Encounter (HOSPITAL_BASED_OUTPATIENT_CLINIC_OR_DEPARTMENT_OTHER): Payer: Self-pay | Admitting: Family Medicine

## 2021-08-21 ENCOUNTER — Ambulatory Visit (INDEPENDENT_AMBULATORY_CARE_PROVIDER_SITE_OTHER): Payer: Medicare Other

## 2021-08-21 VITALS — BP 141/66 | HR 52 | Ht 72.0 in | Wt 243.2 lb

## 2021-08-21 DIAGNOSIS — M25511 Pain in right shoulder: Secondary | ICD-10-CM

## 2021-08-21 DIAGNOSIS — G8929 Other chronic pain: Secondary | ICD-10-CM

## 2021-08-21 NOTE — Assessment & Plan Note (Signed)
Patient reports chronic right shoulder pain.  Pain has been progressively worsening over time, not overhead acute worsening recently when he had gone to reach for something.  Pain is primarily over anterior and lateral shoulder.  Pain will be worse with abduction, trying to lift things with the right arm.  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  He has not had any associated numbness or tingling, no radiation of symptoms.  He has had prior left shoulder issues with prior advanced imaging and procedures/surgeries.  Has not had any specific interventions on the right shoulder. On exam, patient with reduced range of motion, does have some pain with moving through range of motion both actively and passively.  No significant restriction which would suggest frozen shoulder at present.  Does have pain with Neer's, Hawkins, empty can.  Slight decrease strength with empty can as compared to contralateral side.  Distal neurovascular exam is intact. 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  Do not suspect full-thickness tear at this time given that patient did have adequate strength, although was reduced. He has not had any recent imaging, we will proceed with x-rays of the right shoulder at this time Did discuss potential options including steroid injection, physical therapy, particularly if x-rays do not show any significant abnormality but if there is arthritis present.  Would likely consider ultrasound-guided glenohumeral joint injection if arthritis noted on shoulder x-rays, this can be scheduled after x-rays are completed and reviewed Would also consider proceeding with physical therapy after x-rays completed and as long as there  are no surgical issues identified

## 2021-08-21 NOTE — Progress Notes (Signed)
    Procedures performed today:    None.  Independent interpretation of notes and tests performed by another provider:   None.  Brief History, Exam, Impression, and Recommendations:    BP (!) 141/66   Pulse (!) 52   Ht 6' (1.829 m)   Wt 243 lb 3.2 oz (110.3 kg)   SpO2 98%   BMI 32.98 kg/m   Shoulder pain Patient reports chronic right shoulder pain.  Pain has been progressively worsening over time, not overhead acute worsening recently when he had gone to reach for something.  Pain is primarily over anterior and lateral shoulder.  Pain will be worse with abduction, trying to lift things with the right arm.  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  He has not had any associated numbness or tingling, no radiation of symptoms.  He has had prior left shoulder issues with prior advanced imaging and procedures/surgeries.  Has not had any specific interventions on the right shoulder. On exam, patient with reduced range of motion, does have some pain with moving through range of motion both actively and passively.  No significant restriction which would suggest frozen shoulder at present.  Does have pain with Neer's, Hawkins, empty can.  Slight decrease strength with empty can as compared to contralateral side.  Distal neurovascular exam is intact. 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  Do not suspect full-thickness tear at this time given that patient did have adequate strength, although was reduced. He has not had any recent imaging, we will proceed with x-rays of the right shoulder at this time Did discuss potential options including steroid injection, physical therapy, particularly if x-rays do not show any  significant abnormality but if there is arthritis present.  Would likely consider ultrasound-guided glenohumeral joint injection if arthritis noted on shoulder x-rays, this can be scheduled after x-rays are completed and reviewed Would also consider proceeding with physical therapy after x-rays completed and as long as there are no surgical issues identified  Return if symptoms worsen or fail to improve.   ___________________________________________ Zykira Matlack de [place], MD, ABFM, CAQSM Primary Care and [place]

## 2021-09-01 ENCOUNTER — Other Ambulatory Visit (HOSPITAL_BASED_OUTPATIENT_CLINIC_OR_DEPARTMENT_OTHER): Payer: Self-pay

## 2021-09-07 ENCOUNTER — Ambulatory Visit: Payer: Medicare Other | Admitting: Cardiovascular Disease

## 2021-09-22 ENCOUNTER — Other Ambulatory Visit (HOSPITAL_BASED_OUTPATIENT_CLINIC_OR_DEPARTMENT_OTHER): Payer: Self-pay | Admitting: Nurse Practitioner

## 2021-09-22 ENCOUNTER — Other Ambulatory Visit (HOSPITAL_BASED_OUTPATIENT_CLINIC_OR_DEPARTMENT_OTHER): Payer: Self-pay | Admitting: Family Medicine

## 2021-09-22 ENCOUNTER — Other Ambulatory Visit (HOSPITAL_BASED_OUTPATIENT_CLINIC_OR_DEPARTMENT_OTHER): Payer: Self-pay

## 2021-09-22 DIAGNOSIS — I71019 Dissection of thoracic aorta, unspecified: Secondary | ICD-10-CM

## 2021-09-22 MED ORDER — LANSOPRAZOLE 15 MG PO CPDR
15.0000 mg | DELAYED_RELEASE_CAPSULE | Freq: Every day | ORAL | 3 refills | Status: DC
  Filled 2021-09-22: qty 30, 30d supply, fill #0
  Filled 2021-09-25: qty 90, 90d supply, fill #0
  Filled 2021-12-25: qty 30, 30d supply, fill #1

## 2021-09-22 MED ORDER — MONTELUKAST SODIUM 10 MG PO TABS
10.0000 mg | ORAL_TABLET | Freq: Every day | ORAL | 3 refills | Status: DC
  Filled 2021-09-22: qty 30, 30d supply, fill #0
  Filled 2021-09-25: qty 90, 90d supply, fill #0
  Filled 2021-12-25: qty 30, 30d supply, fill #1

## 2021-09-25 ENCOUNTER — Other Ambulatory Visit (HOSPITAL_BASED_OUTPATIENT_CLINIC_OR_DEPARTMENT_OTHER): Payer: Self-pay

## 2021-10-05 ENCOUNTER — Other Ambulatory Visit (HOSPITAL_BASED_OUTPATIENT_CLINIC_OR_DEPARTMENT_OTHER): Payer: Self-pay | Admitting: Nurse Practitioner

## 2021-10-05 ENCOUNTER — Other Ambulatory Visit (HOSPITAL_BASED_OUTPATIENT_CLINIC_OR_DEPARTMENT_OTHER): Payer: Self-pay

## 2021-10-05 DIAGNOSIS — I71019 Dissection of thoracic aorta, unspecified: Secondary | ICD-10-CM

## 2021-10-05 MED ORDER — SIMVASTATIN 20 MG PO TABS
20.0000 mg | ORAL_TABLET | Freq: Every day | ORAL | 3 refills | Status: DC
  Filled 2021-10-05: qty 90, 90d supply, fill #0
  Filled 2021-12-25: qty 30, 30d supply, fill #1

## 2021-10-05 MED FILL — Hydrochlorothiazide Cap 12.5 MG: ORAL | 90 days supply | Qty: 90 | Fill #0 | Status: AC

## 2021-10-21 ENCOUNTER — Encounter (HOSPITAL_BASED_OUTPATIENT_CLINIC_OR_DEPARTMENT_OTHER): Payer: Self-pay | Admitting: Family Medicine

## 2021-10-21 ENCOUNTER — Ambulatory Visit (INDEPENDENT_AMBULATORY_CARE_PROVIDER_SITE_OTHER): Payer: Medicare Other | Admitting: Family Medicine

## 2021-10-21 DIAGNOSIS — E559 Vitamin D deficiency, unspecified: Secondary | ICD-10-CM

## 2021-10-21 DIAGNOSIS — C61 Malignant neoplasm of prostate: Secondary | ICD-10-CM | POA: Diagnosis not present

## 2021-10-21 DIAGNOSIS — I10 Essential (primary) hypertension: Secondary | ICD-10-CM

## 2021-10-21 DIAGNOSIS — E785 Hyperlipidemia, unspecified: Secondary | ICD-10-CM

## 2021-10-21 NOTE — Patient Instructions (Signed)
  Medication Instructions:  Your physician recommends that you continue on your current medications as directed. Please refer to the Current Medication list given to you today. --If you need a refill on any your medications before your next appointment, please call your pharmacy first. If no refills are authorized on file call the office.-- Lab Work: Your physician has recommended that you have lab work today: No If you have labs (blood work) drawn today and your tests are completely normal, you will receive your results via [place] a phone call from our staff.  Please ensure you check your voicemail in the event that you authorized detailed messages to be left on a delegated number. If you have any lab test that is abnormal or we need to change your treatment, we will call you to review the results.  Referrals/Procedures/Imaging: No  Follow-Up: Your next appointment:   Your physician recommends that you schedule a follow-up appointment in: 18 month with Dr. de [place].  You will receive a text message or e-mail with a link to a survey about your care and experience with [place] today! We would greatly appreciate your feedback!   Thanks for letting us be apart of your health journey!!  Primary Care and Sports Medicine   Dr. Arlina Robes [place]   We encourage you to activate your patient portal called "MyChart".  Sign up information is provided on this After Visit Summary.  MyChart is used to connect with patients for Virtual Visits (Telemedicine).  Patients are able to view lab/test results, encounter notes, upcoming appointments, etc.  Non-urgent messages can be sent to your provider as well. To learn more about what you can do with MyChart, please visit --  [email or web address].

## 2021-10-21 NOTE — Assessment & Plan Note (Signed)
Blood pressure borderline in office today.  Patient continues with hydrochlorothiazide, losartan, metoprolol.  Denies any issues at this time.  Does occasionally have some lightheadedness or dizziness when bending over and then standing up.  No current issues related to chest pain or headaches.  He does follow regularly with cardiology as well. We will continue with current medication regimen, recommend intermittent monitoring of blood pressure at home, DASH diet

## 2021-10-21 NOTE — Assessment & Plan Note (Signed)
History of vitamin D deficiency, no recent check, will check vitamin D level today

## 2021-10-21 NOTE — Assessment & Plan Note (Signed)
History of prostate cancer.  Has not had recent PSA check for monitoring, would like to proceed with this today.  Order for PSA placed

## 2021-10-21 NOTE — Progress Notes (Signed)
    Procedures performed today:    None.  Independent interpretation of notes and tests performed by another provider:   None.  Brief History, Exam, Impression, and Recommendations:    BP (!) 141/74   Pulse 65   Ht 6' (1.829 m)   Wt 241 lb (109.3 kg)   SpO2 98%   BMI 32.69 kg/m   Essential hypertension Blood pressure borderline in office today.  Patient continues with hydrochlorothiazide, losartan, metoprolol.  Denies any issues at this time.  Does occasionally have some lightheadedness or dizziness when bending over and then standing up.  No current issues related to chest pain or headaches.  He does follow regularly with cardiology as well. We will continue with current medication regimen, recommend intermittent monitoring of blood pressure at home, DASH diet  Prostate cancer History of prostate cancer.  Has not had recent PSA check for monitoring, would like to proceed with this today.  Order for PSA placed  Vitamin D deficiency History of vitamin D deficiency, no recent check, will check vitamin D level today  Hyperlipidemia Patient continues with simvastatin, is tolerating well, denies any issues with myalgias.  Has not had recent lipid panel completed, will check today as patient is fasting this morning.  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  He will be transitioning to only 5 hours/day when previously was doing closer to 10-12.  Return in about 6 months (around 04/23/2022).   ___________________________________________ Tyce Delcid de [place], MD, ABFM, CAQSM [facility] and [place]

## 2021-10-21 NOTE — Assessment & Plan Note (Signed)
Patient continues with simvastatin, is tolerating well, denies any issues with myalgias.  Has not had recent lipid panel completed, will check today as patient is fasting this morning.

## 2021-10-22 LAB — CBC WITH DIFFERENTIAL/PLATELET
Basophils Absolute: 0.1 10*3/uL (ref 0.0–0.2)
Basos: 1 %
EOS (ABSOLUTE): 0.2 10*3/uL (ref 0.0–0.4)
Eos: 4 %
Hematocrit: 42.3 % (ref 37.5–51.0)
Hemoglobin: 14.5 g/dL (ref 13.0–17.7)
Immature Grans (Abs): 0 10*3/uL (ref 0.0–0.1)
Immature Granulocytes: 0 %
Lymphocytes Absolute: 1.6 10*3/uL (ref 0.7–3.1)
Lymphs: 32 %
MCH: 31 pg (ref 26.6–33.0)
MCHC: 34.3 g/dL (ref 31.5–35.7)
MCV: 91 fL (ref 79–97)
Monocytes Absolute: 0.4 10*3/uL (ref 0.1–0.9)
Monocytes: 8 %
Neutrophils Absolute: 2.8 10*3/uL (ref 1.4–7.0)
Neutrophils: 55 %
Platelets: 144 10*3/uL — ABNORMAL LOW (ref 150–450)
RBC: 4.67 x10E6/uL (ref 4.14–5.80)
RDW: 13.1 % (ref 11.6–15.4)
WBC: 5.1 10*3/uL (ref 3.4–10.8)

## 2021-10-22 LAB — COMPREHENSIVE METABOLIC PANEL
ALT: 16 IU/L (ref 0–44)
AST: 20 IU/L (ref 0–40)
Albumin/Globulin Ratio: 2 (ref 1.2–2.2)
Albumin: 4.5 g/dL (ref 3.8–4.8)
Alkaline Phosphatase: 63 IU/L (ref 44–121)
BUN/Creatinine Ratio: 13 (ref 10–24)
BUN: 13 mg/dL (ref 8–27)
Bilirubin Total: 2.1 mg/dL — ABNORMAL HIGH (ref 0.0–1.2)
CO2: 24 mmol/L (ref 20–29)
Calcium: 9.4 mg/dL (ref 8.6–10.2)
Chloride: 100 mmol/L (ref 96–106)
Creatinine, Ser: 1.02 mg/dL (ref 0.76–1.27)
Globulin, Total: 2.2 g/dL (ref 1.5–4.5)
Glucose: 105 mg/dL — ABNORMAL HIGH (ref 70–99)
Potassium: 4.1 mmol/L (ref 3.5–5.2)
Sodium: 135 mmol/L (ref 134–144)
Total Protein: 6.7 g/dL (ref 6.0–8.5)
eGFR: 75 mL/min/{1.73_m2} (ref >59)

## 2021-10-22 LAB — PSA TOTAL (REFLEX TO FREE): Prostate Specific Ag, Serum: <0.1 ng/mL (ref 0.0–4.0)

## 2021-10-22 LAB — VITAMIN D 25 HYDROXY (VIT D DEFICIENCY, FRACTURES): Vit D, 25-Hydroxy: 51.8 ng/mL (ref 30.0–100.0)

## 2021-10-22 LAB — LIPID PANEL
Chol/HDL Ratio: 3.9 ratio (ref 0.0–5.0)
Cholesterol, Total: 158 mg/dL (ref 100–199)
HDL: 41 mg/dL (ref >39)
LDL Chol Calc (NIH): 82 mg/dL (ref 0–99)
Triglycerides: 209 mg/dL — ABNORMAL HIGH (ref 0–149)
VLDL Cholesterol Cal: 35 mg/dL (ref 5–40)

## 2021-10-26 ENCOUNTER — Other Ambulatory Visit (HOSPITAL_BASED_OUTPATIENT_CLINIC_OR_DEPARTMENT_OTHER): Payer: Self-pay

## 2021-11-03 ENCOUNTER — Other Ambulatory Visit (HOSPITAL_BASED_OUTPATIENT_CLINIC_OR_DEPARTMENT_OTHER): Payer: Self-pay

## 2021-11-03 MED FILL — Losartan Potassium Tab 25 MG: ORAL | 90 days supply | Qty: 90 | Fill #1 | Status: AC

## 2021-11-03 MED FILL — Metoprolol Tartrate Tab 25 MG: ORAL | 90 days supply | Qty: 90 | Fill #1 | Status: AC

## 2021-11-19 ENCOUNTER — Other Ambulatory Visit (HOSPITAL_BASED_OUTPATIENT_CLINIC_OR_DEPARTMENT_OTHER): Payer: Self-pay

## 2021-11-19 MED ORDER — AREXVY 120 MCG/0.5ML IM SUSR
INTRAMUSCULAR | 0 refills | Status: DC
  Filled 2021-11-19: qty 0.5, 1d supply, fill #0

## 2021-11-19 MED ORDER — INFLUENZA VAC A&B SA ADJ QUAD 0.5 ML IM PRSY
PREFILLED_SYRINGE | INTRAMUSCULAR | 0 refills | Status: DC
  Filled 2021-11-19: qty 0.5, 1d supply, fill #0

## 2021-11-20 ENCOUNTER — Other Ambulatory Visit (HOSPITAL_BASED_OUTPATIENT_CLINIC_OR_DEPARTMENT_OTHER): Payer: Self-pay

## 2021-12-25 ENCOUNTER — Other Ambulatory Visit (HOSPITAL_BASED_OUTPATIENT_CLINIC_OR_DEPARTMENT_OTHER): Payer: Self-pay

## 2021-12-25 MED FILL — Hydrochlorothiazide Cap 12.5 MG: ORAL | 90 days supply | Qty: 90 | Fill #1 | Status: AC

## 2022-01-08 ENCOUNTER — Other Ambulatory Visit (HOSPITAL_BASED_OUTPATIENT_CLINIC_OR_DEPARTMENT_OTHER): Payer: Self-pay

## 2022-01-13 ENCOUNTER — Encounter (HOSPITAL_BASED_OUTPATIENT_CLINIC_OR_DEPARTMENT_OTHER): Payer: Self-pay | Admitting: Family Medicine

## 2022-01-13 ENCOUNTER — Ambulatory Visit (INDEPENDENT_AMBULATORY_CARE_PROVIDER_SITE_OTHER): Payer: Medicare Other | Admitting: Family Medicine

## 2022-01-13 VITALS — BP 110/65 | HR 54 | Ht 72.0 in | Wt 245.0 lb

## 2022-01-13 DIAGNOSIS — M25511 Pain in right shoulder: Secondary | ICD-10-CM

## 2022-01-13 DIAGNOSIS — G8929 Other chronic pain: Secondary | ICD-10-CM | POA: Diagnosis not present

## 2022-01-13 NOTE — Progress Notes (Signed)
    Procedures performed today:    Procedure: Corticosteroid injection of the right subacromial bursa Verbal informed consent obtained.  Time-out conducted.  Noted no overlying erythema, induration, or other signs of local infection.  Skin prepped in a sterile fashion.  Local anesthesia: Topical Ethyl chloride.  With sterile technique: 1 cc Kenalog 40, 3 cc lidocaine injected easily into right subacromial space Completed without difficulty  Advised to call if fevers/chills, erythema, induration, drainage, or persistent bleeding. Impression: Technically successful corticosteroid injection.  Independent interpretation of notes and tests performed by another provider:   None.  Brief History, Exam, Impression, and Recommendations:    BP 110/65   Pulse (!) 54   Ht 6' (1.829 m)   Wt 245 lb (111.1 kg)   SpO2 98%   BMI 33.23 kg/m   Shoulder pain Patient presents for follow-up of right shoulder pain.  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  He is presenting today due to continued pain and for consideration of steroid injection given continued symptoms.  He has also had restriction in range of motion which remains at about baseline for him.  Does have pain with abduction and overhead movements. Due to current symptoms, he is interested in steroid injection at this time. On exam, patient does have some restriction on range of motion, primarily with abduction, internal and external rotation.  He does have pain with empty can testing as well as Neer's.  Mild symptoms with Hawkins.  Distal neurovascular exam is intact. Reviewed images with patient today and discussed treatment considerations.  He indicates that he has done physical therapy in the past and is generally not interested in this at the present time.  He would be interested in proceeding with steroid injection today.  We discussed  potential risks and benefits and potential complications related to the procedure itself We will initially proceed with subacromial steroid injection, see procedure note above If patient has incomplete response, additional considerations for ultrasound-guided glenohumeral joint injection. We will plan for follow-up at upcoming visit in about 6 weeks to assess progress and follow-up on chronic medical issues.  Return if symptoms worsen or fail to improve.   ___________________________________________ [name], MD, ABFM, [facility] and [place]

## 2022-01-13 NOTE — Assessment & Plan Note (Signed)
Patient presents for follow-up of right shoulder pain.  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  He is presenting today due to continued pain and for consideration of steroid injection given continued symptoms.  He has also had restriction in range of motion which remains at about baseline for him.  Does have pain with abduction and overhead movements. Due to current symptoms, he is interested in steroid injection at this time. On exam, patient does have some restriction on range of motion, primarily with abduction, internal and external rotation.  He does have pain with empty can testing as well as Neer's.  Mild symptoms with Hawkins.  Distal neurovascular exam is intact. Reviewed images with patient today and discussed treatment considerations.  He indicates that he has done physical therapy in the past and is generally not interested in this at the present time.  He would be interested in proceeding with steroid injection today.  We discussed potential risks and benefits and potential complications related to the procedure itself We will initially proceed with subacromial steroid injection, see procedure note above If patient has incomplete response, additional considerations for ultrasound-guided glenohumeral joint injection. We will plan for follow-up at upcoming visit in about 6 weeks to assess progress and follow-up on chronic medical issues.

## 2022-01-13 NOTE — Patient Instructions (Signed)

## 2022-01-25 ENCOUNTER — Ambulatory Visit (HOSPITAL_BASED_OUTPATIENT_CLINIC_OR_DEPARTMENT_OTHER): Payer: Medicare Other | Admitting: Family Medicine

## 2022-02-05 ENCOUNTER — Other Ambulatory Visit (HOSPITAL_BASED_OUTPATIENT_CLINIC_OR_DEPARTMENT_OTHER): Payer: Self-pay | Admitting: Nurse Practitioner

## 2022-02-05 ENCOUNTER — Other Ambulatory Visit (HOSPITAL_BASED_OUTPATIENT_CLINIC_OR_DEPARTMENT_OTHER): Payer: Self-pay

## 2022-02-05 ENCOUNTER — Other Ambulatory Visit (HOSPITAL_BASED_OUTPATIENT_CLINIC_OR_DEPARTMENT_OTHER): Payer: Self-pay | Admitting: Family Medicine

## 2022-02-05 DIAGNOSIS — I71019 Dissection of thoracic aorta, unspecified: Secondary | ICD-10-CM

## 2022-02-08 ENCOUNTER — Other Ambulatory Visit (HOSPITAL_BASED_OUTPATIENT_CLINIC_OR_DEPARTMENT_OTHER): Payer: Self-pay

## 2022-02-08 MED ORDER — LOSARTAN POTASSIUM 25 MG PO TABS
ORAL_TABLET | ORAL | 1 refills | Status: DC
  Filled 2022-02-08: qty 90, 90d supply, fill #0
  Filled 2022-04-30: qty 90, 90d supply, fill #1

## 2022-02-08 MED ORDER — LANSOPRAZOLE 15 MG PO CPDR
15.0000 mg | DELAYED_RELEASE_CAPSULE | Freq: Every day | ORAL | 3 refills | Status: DC
  Filled 2022-02-08: qty 90, 90d supply, fill #0
  Filled 2022-04-30: qty 30, 30d supply, fill #1

## 2022-02-09 ENCOUNTER — Other Ambulatory Visit (HOSPITAL_BASED_OUTPATIENT_CLINIC_OR_DEPARTMENT_OTHER): Payer: Self-pay | Admitting: Family Medicine

## 2022-02-09 ENCOUNTER — Other Ambulatory Visit (HOSPITAL_BASED_OUTPATIENT_CLINIC_OR_DEPARTMENT_OTHER): Payer: Self-pay

## 2022-02-09 ENCOUNTER — Ambulatory Visit (INDEPENDENT_AMBULATORY_CARE_PROVIDER_SITE_OTHER): Payer: Medicare Other

## 2022-02-09 ENCOUNTER — Encounter (HOSPITAL_BASED_OUTPATIENT_CLINIC_OR_DEPARTMENT_OTHER): Payer: Self-pay | Admitting: Family Medicine

## 2022-02-09 ENCOUNTER — Ambulatory Visit (INDEPENDENT_AMBULATORY_CARE_PROVIDER_SITE_OTHER): Payer: Medicare Other | Admitting: Family Medicine

## 2022-02-09 VITALS — BP 87/68 | HR 59 | Temp 97.5°F | Ht 72.0 in | Wt 228.8 lb

## 2022-02-09 DIAGNOSIS — R350 Frequency of micturition: Secondary | ICD-10-CM | POA: Diagnosis not present

## 2022-02-09 DIAGNOSIS — R0602 Shortness of breath: Secondary | ICD-10-CM | POA: Diagnosis not present

## 2022-02-09 DIAGNOSIS — R0609 Other forms of dyspnea: Secondary | ICD-10-CM

## 2022-02-09 DIAGNOSIS — R059 Cough, unspecified: Secondary | ICD-10-CM | POA: Diagnosis not present

## 2022-02-09 DIAGNOSIS — R5383 Other fatigue: Secondary | ICD-10-CM

## 2022-02-09 LAB — POCT URINALYSIS DIPSTICK
Glucose, UA: NEGATIVE
Ketones, UA: NEGATIVE
Nitrite, UA: NEGATIVE
Protein, UA: POSITIVE — AB
Spec Grav, UA: >=1.03 — AB (ref 1.010–1.025)
Urobilinogen, UA: 1 E.U./dL
pH, UA: 5.5 (ref 5.0–8.0)

## 2022-02-09 MED ORDER — TAMSULOSIN HCL 0.4 MG PO CAPS
0.4000 mg | ORAL_CAPSULE | Freq: Every day | ORAL | 6 refills | Status: DC
  Filled 2022-02-09: qty 90, 90d supply, fill #0
  Filled 2022-04-30: qty 90, 90d supply, fill #1

## 2022-02-09 NOTE — Patient Instructions (Signed)
  Medication Instructions:  Your physician recommends that you continue on your current medications as directed. Please refer to the Current Medication list given to you today. --If you need a refill on any your medications before your next appointment, please call your pharmacy first. If no refills are authorized on file call the office.-- Lab Work: Your physician has recommended that you have lab work today: Yes If you have labs (blood work) drawn today and your tests are completely normal, you will receive your results via [place] a phone call from our staff.  Please ensure you check your voicemail in the event that you authorized detailed messages to be left on a delegated number. If you have any lab test that is abnormal or we need to change your treatment, we will call you to review the results.  Referrals/Procedures/Imaging: No  Follow-Up: Your next appointment:   Your physician recommends that you schedule a follow-up appointment as needed with Dr. de [place].  You will receive a text message or e-mail with a link to a survey about your care and experience with [place] today! We would greatly appreciate your feedback!   Thanks for letting us be apart of your health journey!!  Primary Care and Sports Medicine   Dr. Arlina Robes [place]   We encourage you to activate your patient portal called "MyChart".  Sign up information is provided on this After Visit Summary.  MyChart is used to connect with patients for Virtual Visits (Telemedicine).  Patients are able to view lab/test results, encounter notes, upcoming appointments, etc.  Non-urgent messages can be sent to your provider as well. To learn more about what you can do with MyChart, please visit --  [email or web address].

## 2022-02-09 NOTE — Assessment & Plan Note (Signed)
Patient reports having recent illness about 2 weeks ago, primarily dealing with GI symptoms, nausea and vomiting.  Those symptoms have since resolved, however patient continues to have lingering effects, primarily related to fatigue, weakness, occasional cough.  He additionally has felt that he has been urinating more frequently over the past week or so and does report some occasional pain/burning with urination.  He is not aware of any recent fevers.  Has remained up-to-date with recommended seasonal vaccinations.  Chronically, he has been having shortness of breath with exertion which has generally remained at about baseline, possibly has had slight progression On exam, patient appears ill, is not in acute distress at this time.  Blood pressure is lower than it has been at prior office visits.  Cardiovascular exam with regular rate and rhythm.  Lungs clear to auscultation bilaterally. Potential causes for current symptoms include current infection such as pneumonia or UTI.  Additionally, could be lingering symptoms which are slowly resolving from recent infection beginning a couple weeks ago.  At this time, we will proceed with laboratory testing including CBC and CMP as well as urinalysis today.  We will also have patient complete chest x-ray.  Given some of his increased urinary frequency, we will have patient hold hydrochlorothiazide for now both related to urinary issues and low blood pressure

## 2022-02-09 NOTE — Assessment & Plan Note (Signed)
As above, patient has had some recent issues with this.  We will check UA and proceed accordingly related to any need for culture or antibiotic therapy

## 2022-02-09 NOTE — Progress Notes (Signed)
    Procedures performed today:    None.  Independent interpretation of notes and tests performed by another provider:   None.  Brief History, Exam, Impression, and Recommendations:    BP (!) 87/68 (BP Location: Right Arm, Patient Position: Sitting, Cuff Size: Large)   Pulse (!) 59   Temp (!) 97.5 F (36.4 C) (Oral)   Ht 6' (1.829 m)   Wt 228 lb 12.8 oz (103.8 kg)   SpO2 99%   BMI 31.03 kg/m   Fatigue Patient reports having recent illness about 2 weeks ago, primarily dealing with GI symptoms, nausea and vomiting.  Those symptoms have since resolved, however patient continues to have lingering effects, primarily related to fatigue, weakness, occasional cough.  He additionally has felt that he has been urinating more frequently over the past week or so and does report some occasional pain/burning with urination.  He is not aware of any recent fevers.  Has remained up-to-date with recommended seasonal vaccinations.  Chronically, he has been having shortness of breath with exertion which has generally remained at about baseline, possibly has had slight progression On exam, patient appears ill, is not in acute distress at this time.  Blood pressure is lower than it has been at prior office visits.  Cardiovascular exam with regular rate and rhythm.  Lungs clear to auscultation bilaterally. Potential causes for current symptoms include current infection such as pneumonia or UTI.  Additionally, could be lingering symptoms which are slowly resolving from recent infection beginning a couple weeks ago.  At this time, we will proceed with laboratory testing including CBC and CMP as well as urinalysis today.  We will also have patient complete chest x-ray.  Given some of his increased urinary frequency, we will have patient hold hydrochlorothiazide for now both related to urinary issues and low blood pressure  Urinary frequency As above, patient has had some recent issues with this.  We will check UA  and proceed accordingly related to any need for culture or antibiotic therapy  Return if symptoms worsen or fail to improve.  ER precautions discussed should patient experience any persistent or worsening symptoms   ___________________________________________ Marthe Dant de [place], MD, ABFM, [facility] Primary Care and [place]

## 2022-02-09 NOTE — Addendum Note (Signed)
Addended by: Lowella Bandy on: 02/09/2022 03:47 PM   Modules accepted: Orders

## 2022-02-10 ENCOUNTER — Other Ambulatory Visit (HOSPITAL_BASED_OUTPATIENT_CLINIC_OR_DEPARTMENT_OTHER): Payer: Self-pay | Admitting: Family Medicine

## 2022-02-10 DIAGNOSIS — R7989 Other specified abnormal findings of blood chemistry: Secondary | ICD-10-CM

## 2022-02-10 LAB — CBC WITH DIFFERENTIAL/PLATELET
Basophils Absolute: 0 10*3/uL (ref 0.0–0.2)
Basos: 0 %
EOS (ABSOLUTE): 0.1 10*3/uL (ref 0.0–0.4)
Eos: 1 %
Hematocrit: 46.2 % (ref 37.5–51.0)
Hemoglobin: 15.4 g/dL (ref 13.0–17.7)
Immature Grans (Abs): 0 10*3/uL (ref 0.0–0.1)
Immature Granulocytes: 0 %
Lymphocytes Absolute: 1.6 10*3/uL (ref 0.7–3.1)
Lymphs: 19 %
MCH: 29.7 pg (ref 26.6–33.0)
MCHC: 33.3 g/dL (ref 31.5–35.7)
MCV: 89 fL (ref 79–97)
Monocytes Absolute: 1 10*3/uL — ABNORMAL HIGH (ref 0.1–0.9)
Monocytes: 12 %
Neutrophils Absolute: 5.8 10*3/uL (ref 1.4–7.0)
Neutrophils: 68 %
Platelets: 179 10*3/uL (ref 150–450)
RBC: 5.19 x10E6/uL (ref 4.14–5.80)
RDW: 12.6 % (ref 11.6–15.4)
WBC: 8.5 10*3/uL (ref 3.4–10.8)

## 2022-02-10 LAB — COMPREHENSIVE METABOLIC PANEL
ALT: 22 IU/L (ref 0–44)
AST: 26 IU/L (ref 0–40)
Albumin/Globulin Ratio: 1.6 (ref 1.2–2.2)
Albumin: 4.1 g/dL (ref 3.8–4.8)
Alkaline Phosphatase: 77 IU/L (ref 44–121)
BUN/Creatinine Ratio: 16 (ref 10–24)
BUN: 25 mg/dL (ref 8–27)
Bilirubin Total: 3 mg/dL — ABNORMAL HIGH (ref 0.0–1.2)
CO2: 22 mmol/L (ref 20–29)
Calcium: 9.6 mg/dL (ref 8.6–10.2)
Chloride: 95 mmol/L — ABNORMAL LOW (ref 96–106)
Creatinine, Ser: 1.58 mg/dL — ABNORMAL HIGH (ref 0.76–1.27)
Globulin, Total: 2.6 g/dL (ref 1.5–4.5)
Glucose: 99 mg/dL (ref 70–99)
Potassium: 4 mmol/L (ref 3.5–5.2)
Sodium: 133 mmol/L — ABNORMAL LOW (ref 134–144)
Total Protein: 6.7 g/dL (ref 6.0–8.5)
eGFR: 44 mL/min/{1.73_m2} — ABNORMAL LOW (ref >59)

## 2022-02-10 NOTE — Addendum Note (Signed)
Addended by: Lowella Bandy on: 02/10/2022 08:47 AM   Modules accepted: Orders

## 2022-02-13 LAB — URINE CULTURE

## 2022-02-16 ENCOUNTER — Other Ambulatory Visit (HOSPITAL_BASED_OUTPATIENT_CLINIC_OR_DEPARTMENT_OTHER): Payer: Self-pay

## 2022-02-16 ENCOUNTER — Other Ambulatory Visit (HOSPITAL_BASED_OUTPATIENT_CLINIC_OR_DEPARTMENT_OTHER): Payer: Self-pay | Admitting: Family Medicine

## 2022-02-16 MED ORDER — NITROFURANTOIN MONOHYD MACRO 100 MG PO CAPS
100.0000 mg | ORAL_CAPSULE | Freq: Two times a day (BID) | ORAL | 0 refills | Status: AC
  Filled 2022-02-16: qty 14, 7d supply, fill #0

## 2022-02-19 ENCOUNTER — Encounter (HOSPITAL_BASED_OUTPATIENT_CLINIC_OR_DEPARTMENT_OTHER): Payer: Self-pay

## 2022-02-22 ENCOUNTER — Encounter (HOSPITAL_BASED_OUTPATIENT_CLINIC_OR_DEPARTMENT_OTHER): Payer: Self-pay | Admitting: Family Medicine

## 2022-02-22 ENCOUNTER — Ambulatory Visit (INDEPENDENT_AMBULATORY_CARE_PROVIDER_SITE_OTHER): Payer: Medicare Other | Admitting: Family Medicine

## 2022-02-22 ENCOUNTER — Other Ambulatory Visit (HOSPITAL_BASED_OUTPATIENT_CLINIC_OR_DEPARTMENT_OTHER): Payer: Self-pay

## 2022-02-22 VITALS — BP 103/61 | HR 51 | Ht 72.0 in | Wt 237.4 lb

## 2022-02-22 DIAGNOSIS — R7989 Other specified abnormal findings of blood chemistry: Secondary | ICD-10-CM

## 2022-02-22 DIAGNOSIS — N3001 Acute cystitis with hematuria: Secondary | ICD-10-CM | POA: Diagnosis not present

## 2022-02-22 DIAGNOSIS — N390 Urinary tract infection, site not specified: Secondary | ICD-10-CM | POA: Insufficient documentation

## 2022-02-22 DIAGNOSIS — R319 Hematuria, unspecified: Secondary | ICD-10-CM

## 2022-02-22 MED ORDER — CIPROFLOXACIN HCL 250 MG PO TABS
250.0000 mg | ORAL_TABLET | Freq: Two times a day (BID) | ORAL | 0 refills | Status: AC
  Filled 2022-02-22: qty 6, 3d supply, fill #0

## 2022-02-22 NOTE — Assessment & Plan Note (Signed)
Noted on recent urine testing.  This was also observed in setting of acute UTI.  Related to this, we will plan to recheck urine sample to observe for hematuria in about 4 to 6 weeks to ensure that it has cleared with treatment of underlying fraction If hematuria does persist, we will complete additional urinary testing and likely arrange for evaluation with urology

## 2022-02-22 NOTE — Assessment & Plan Note (Signed)
Noted previously, potentially related to acute illness patient was experiencing shortly before lab draw related to GI symptoms, possible underlying dehydration.  We will plan to recheck kidney function today to assess current status

## 2022-02-22 NOTE — Progress Notes (Signed)
    Procedures performed today:    None.  Independent interpretation of notes and tests performed by another provider:   None.  Brief History, Exam, Impression, and Recommendations:    BP 103/61 (BP Location: Left Arm, Patient Position: Sitting, Cuff Size: Large)   Pulse (!) 51   Ht 6' (1.829 m)   Wt 237 lb 6.4 oz (107.7 kg)   SpO2 98%   BMI 32.20 kg/m   UTI (urinary tract infection) Recent urine culture with evidence of E. coli infection.  Treatment was initiated with Macrobid, however after a couple days of treatment, patient began to have some GI upset, nausea, episode of vomiting.  He discontinued medication related to this.  He has not had any fevers, chills, sweats, no back pain.  Still does have mild urinary. Patient is in no acute distress, vital signs are stable. Given incomplete treatment course with Macrobid due to side effects, we will complete treatment with use of ciprofloxacin.  He reports that he has tolerated this medication in the past.  We did discuss potential side effects related to this, notably related to C. difficile diarrhea which can occur.  Elevated serum creatinine Noted previously, potentially related to acute illness patient was experiencing shortly before lab draw related to GI symptoms, possible underlying dehydration.  We will plan to recheck kidney function today to assess current status  Hematuria Noted on recent urine testing.  This was also observed in setting of acute UTI.  Related to this, we will plan to recheck urine sample to observe for hematuria in about 4 to 6 weeks to ensure that it has cleared with treatment of underlying fraction If hematuria does persist, we will complete additional urinary testing and likely arrange for evaluation with urology  Return in about 4 weeks (around 03/22/2022).   ___________________________________________ Jaycelyn Orrison [place], MD, ABFM, [facility] Primary Care and [place]

## 2022-02-22 NOTE — Assessment & Plan Note (Signed)
Recent urine culture with evidence of E. coli infection.  Treatment was initiated with Macrobid, however after a couple days of treatment, patient began to have some GI upset, nausea, episode of vomiting.  He discontinued medication related to this.  He has not had any fevers, chills, sweats, no back pain.  Still does have mild urinary. Patient is in no acute distress, vital signs are stable. Given incomplete treatment course with Macrobid due to side effects, we will complete treatment with use of ciprofloxacin.  He reports that he has tolerated this medication in the past.  We did discuss potential side effects related to this, notably related to C. difficile diarrhea which can occur.

## 2022-02-22 NOTE — Patient Instructions (Signed)
  Medication Instructions:  Your physician recommends that you continue on your current medications as directed. Please refer to the Current Medication list given to you today. --If you need a refill on any your medications before your next appointment, please call your pharmacy first. If no refills are authorized on file call the office.-- Lab Work: Your physician has recommended that you have lab work today: Yes If you have labs (blood work) drawn today and your tests are completely normal, you will receive your results via [place] a phone call from our staff.  Please ensure you check your voicemail in the event that you authorized detailed messages to be left on a delegated number. If you have any lab test that is abnormal or we need to change your treatment, we will call you to review the results.  Referrals/Procedures/Imaging: No  Follow-Up: Your next appointment:   Your physician recommends that you schedule a follow-up appointment in: 4 weeks with Dr. de [place].  You will receive a text message or e-mail with a link to a survey about your care and experience with [place] today! We would greatly appreciate your feedback!   Thanks for letting us be apart of your health journey!!  Primary Care and Sports Medicine   Dr. Arlina Robes [place]   We encourage you to activate your patient portal called "MyChart".  Sign up information is provided on this After Visit Summary.  MyChart is used to connect with patients for Virtual Visits (Telemedicine).  Patients are able to view lab/test results, encounter notes, upcoming appointments, etc.  Non-urgent messages can be sent to your provider as well. To learn more about what you can do with MyChart, please visit --  [email or web address].

## 2022-02-23 LAB — BASIC METABOLIC PANEL
BUN/Creatinine Ratio: 12 (ref 10–24)
BUN: 13 mg/dL (ref 8–27)
CO2: 25 mmol/L (ref 20–29)
Calcium: 8.9 mg/dL (ref 8.6–10.2)
Chloride: 107 mmol/L — ABNORMAL HIGH (ref 96–106)
Creatinine, Ser: 1.08 mg/dL (ref 0.76–1.27)
Glucose: 99 mg/dL (ref 70–99)
Potassium: 4 mmol/L (ref 3.5–5.2)
Sodium: 141 mmol/L (ref 134–144)
eGFR: 70 mL/min/{1.73_m2} (ref >59)

## 2022-02-24 NOTE — Telephone Encounter (Signed)
error 

## 2022-03-04 ENCOUNTER — Ambulatory Visit (INDEPENDENT_AMBULATORY_CARE_PROVIDER_SITE_OTHER): Payer: Medicare Other | Admitting: Family Medicine

## 2022-03-04 ENCOUNTER — Other Ambulatory Visit (HOSPITAL_BASED_OUTPATIENT_CLINIC_OR_DEPARTMENT_OTHER): Payer: Self-pay

## 2022-03-04 ENCOUNTER — Encounter (HOSPITAL_BASED_OUTPATIENT_CLINIC_OR_DEPARTMENT_OTHER): Payer: Self-pay | Admitting: Family Medicine

## 2022-03-04 VITALS — BP 153/61 | HR 57 | Temp 97.6°F | Ht 72.0 in | Wt 243.0 lb

## 2022-03-04 DIAGNOSIS — R300 Dysuria: Secondary | ICD-10-CM | POA: Insufficient documentation

## 2022-03-04 LAB — POCT URINALYSIS DIPSTICK
Bilirubin, UA: NEGATIVE
Blood, UA: NEGATIVE
Glucose, UA: NEGATIVE
Ketones, UA: NEGATIVE
Nitrite, UA: NEGATIVE
Protein, UA: NEGATIVE
Spec Grav, UA: 1.02 (ref 1.010–1.025)
Urobilinogen, UA: 0.2 E.U./dL
pH, UA: 6 (ref 5.0–8.0)

## 2022-03-04 MED ORDER — CIPROFLOXACIN HCL 500 MG PO TABS
500.0000 mg | ORAL_TABLET | Freq: Two times a day (BID) | ORAL | 0 refills | Status: AC
  Filled 2022-03-04: qty 56, 28d supply, fill #0

## 2022-03-04 NOTE — Progress Notes (Signed)
    Procedures performed today:    None.  Independent interpretation of notes and tests performed by another provider:   None.  Brief History, Exam, Impression, and Recommendations:    BP (!) 153/61 (BP Location: Left Arm, Patient Position: Sitting, Cuff Size: Large)   Pulse (!) 57   Temp 97.6 F (36.4 C) (Oral)   Ht 6' (1.829 m)   Wt 243 lb (110.2 kg)   SpO2 99%   BMI 32.96 kg/m   Dysuria Patient reports that he has recently developed dysuria, urinary urgency, frequency.  He has some associated pubic discomfort.  He has not noticed any urinary changes such as increased cloudiness, blood in the urine.  He did have recent symptoms similar to these previously for which he received antibiotic treatment.  He does feel that symptoms abated to some degree, however unsure if they completely resolved since that time.  Prior urine culture did reveal E. coli.  He has not had any recent fever, chills, sweats.  No lightheadedness or dizziness.  No generalized weakness, fatigue. On exam, patient is in no acute distress, vital signs stable, patient is afebrile. Current symptoms are concerning for acute urinary tract infection, additional concern for acute prostatitis.  Overall, patient is nontoxic appearing, does not appear to be acutely ill.  UA completed in office and found to showed negative nitrites, trace leukocyte esterase.  On previous urine testing, results are similar and patient was found to have acute infection with E. coli.  He previously tolerated ciprofloxacin without issue.  Patient does have history of prostate cancer, last time that he had followed up with urologist was at least 3 to 4 years ago.  He was following with [facility]. Today, we will begin with empiric treatment with ciprofloxacin, discussed expected treatment course which will be longer than prior antibiotic therapy.  We will send urine for culture to assess for any alternative bacterial etiology or any antibiotic  resistance we will plan to have patient return in 1 week for repeat urine culture Referral to urology so that patient may reestablish with them given ongoing urinary issues for possible additional evaluation.   ___________________________________________ Yesli Vanderhoff [place], MD, ABFM, [facility] Primary Care and [place]

## 2022-03-04 NOTE — Assessment & Plan Note (Signed)
Patient reports that he has recently developed dysuria, urinary urgency, frequency.  He has some associated pubic discomfort.  He has not noticed any urinary changes such as increased cloudiness, blood in the urine.  He did have recent symptoms similar to these previously for which he received antibiotic treatment.  He does feel that symptoms abated to some degree, however unsure if they completely resolved since that time.  Prior urine culture did reveal E. coli.  He has not had any recent fever, chills, sweats.  No lightheadedness or dizziness.  No generalized weakness, fatigue. On exam, patient is in no acute distress, vital signs stable, patient is afebrile. Current symptoms are concerning for acute urinary tract infection, additional concern for acute prostatitis.  Overall, patient is nontoxic appearing, does not appear to be acutely ill.  UA completed in office and found to showed negative nitrites, trace leukocyte esterase.  On previous urine testing, results are similar and patient was found to have acute infection with E. coli.  He previously tolerated ciprofloxacin without issue.  Patient does have history of prostate cancer, last time that he had followed up with urologist was at least 3 to 4 years ago.  He was following with [facility]. Today, we will begin with empiric treatment with ciprofloxacin, discussed expected treatment course which will be longer than prior antibiotic therapy.  We will send urine for culture to assess for any alternative bacterial etiology or any antibiotic resistance we will plan to have patient return in 1 week for repeat urine culture Referral to urology so that patient may reestablish with them given ongoing urinary issues for possible additional evaluation.

## 2022-03-04 NOTE — Addendum Note (Signed)
Addended by: Lowella Bandy on: 03/04/2022 04:32 PM   Modules accepted: Orders

## 2022-03-08 LAB — URINE CULTURE

## 2022-03-08 LAB — SPECIMEN STATUS REPORT

## 2022-03-09 ENCOUNTER — Other Ambulatory Visit (HOSPITAL_BASED_OUTPATIENT_CLINIC_OR_DEPARTMENT_OTHER): Payer: Self-pay

## 2022-03-10 ENCOUNTER — Other Ambulatory Visit (HOSPITAL_BASED_OUTPATIENT_CLINIC_OR_DEPARTMENT_OTHER): Payer: Self-pay

## 2022-03-11 ENCOUNTER — Ambulatory Visit (INDEPENDENT_AMBULATORY_CARE_PROVIDER_SITE_OTHER): Payer: Medicare Other | Admitting: Family Medicine

## 2022-03-11 DIAGNOSIS — R300 Dysuria: Secondary | ICD-10-CM | POA: Diagnosis not present

## 2022-03-11 LAB — POCT URINALYSIS DIPSTICK
Bilirubin, UA: NEGATIVE
Glucose, UA: NEGATIVE
Ketones, UA: NEGATIVE
Nitrite, UA: NEGATIVE
Protein, UA: POSITIVE — AB
Spec Grav, UA: 1.025 (ref 1.010–1.025)
Urobilinogen, UA: 0.2 E.U./dL
pH, UA: 6 (ref 5.0–8.0)

## 2022-03-11 NOTE — Progress Notes (Signed)
Pt was here to drop off urine sample.

## 2022-03-12 ENCOUNTER — Other Ambulatory Visit (HOSPITAL_BASED_OUTPATIENT_CLINIC_OR_DEPARTMENT_OTHER): Payer: Self-pay

## 2022-03-12 ENCOUNTER — Other Ambulatory Visit (HOSPITAL_BASED_OUTPATIENT_CLINIC_OR_DEPARTMENT_OTHER): Payer: Self-pay | Admitting: Family Medicine

## 2022-03-12 DIAGNOSIS — I71019 Dissection of thoracic aorta, unspecified: Secondary | ICD-10-CM

## 2022-03-12 LAB — URINE CULTURE: Organism ID, Bacteria: NO GROWTH

## 2022-03-12 MED FILL — Metoprolol Tartrate Tab 25 MG: ORAL | 3 days supply | Qty: 3 | Fill #2 | Status: AC

## 2022-03-16 ENCOUNTER — Other Ambulatory Visit (HOSPITAL_BASED_OUTPATIENT_CLINIC_OR_DEPARTMENT_OTHER): Payer: Self-pay

## 2022-03-16 MED ORDER — SIMVASTATIN 20 MG PO TABS
20.0000 mg | ORAL_TABLET | Freq: Every day | ORAL | 3 refills | Status: DC
  Filled 2022-03-16: qty 90, 90d supply, fill #0
  Filled 2022-06-08 (×2): qty 30, 30d supply, fill #1

## 2022-03-16 MED ORDER — MONTELUKAST SODIUM 10 MG PO TABS
10.0000 mg | ORAL_TABLET | Freq: Every day | ORAL | 3 refills | Status: DC
  Filled 2022-03-16: qty 90, 90d supply, fill #0
  Filled 2022-06-08 (×2): qty 30, 30d supply, fill #1

## 2022-03-16 MED ORDER — METOPROLOL TARTRATE 25 MG PO TABS
12.5000 mg | ORAL_TABLET | Freq: Two times a day (BID) | ORAL | 1 refills | Status: DC
  Filled 2022-03-16: qty 90, 90d supply, fill #0
  Filled 2022-06-08 (×2): qty 90, 90d supply, fill #1

## 2022-03-19 ENCOUNTER — Other Ambulatory Visit (HOSPITAL_BASED_OUTPATIENT_CLINIC_OR_DEPARTMENT_OTHER): Payer: Self-pay

## 2022-03-22 ENCOUNTER — Ambulatory Visit (INDEPENDENT_AMBULATORY_CARE_PROVIDER_SITE_OTHER): Payer: Medicare Other | Admitting: Family Medicine

## 2022-03-22 ENCOUNTER — Encounter (HOSPITAL_BASED_OUTPATIENT_CLINIC_OR_DEPARTMENT_OTHER): Payer: Self-pay | Admitting: Family Medicine

## 2022-03-22 VITALS — BP 136/62 | HR 60 | Ht 72.0 in | Wt 234.0 lb

## 2022-03-22 DIAGNOSIS — R300 Dysuria: Secondary | ICD-10-CM | POA: Diagnosis not present

## 2022-03-22 NOTE — Assessment & Plan Note (Signed)
Since last appointment, patient has been doing well with longer course of antibiotics, no longer having any dysuria.  He denies any issues with chest pain, fever, chills, sweats.  He has not had any significant GI upset or issues with diarrhea.  Continues to tolerate antibiotics without issue at this time.  Given that he has been doing better, he feels that he does not need further evaluation with urology at this time Recommend continuing with course of antibiotics and taking to completion.  Still feel that he would benefit from evaluation with urology to ensure no other evaluation or testing is needed. Will plan for follow-up in about 3 to 4 months to monitor progress or sooner as needed

## 2022-03-22 NOTE — Progress Notes (Signed)
    Procedures performed today:    None.  Independent interpretation of notes and tests performed by another provider:   None.  Brief History, Exam, Impression, and Recommendations:    BP 136/62 (BP Location: Left Arm, Patient Position: Sitting, Cuff Size: Large)   Pulse 60   Ht 6' (1.829 m)   Wt 234 lb (106.1 kg)   SpO2 97%   BMI 31.74 kg/m   Dysuria Since last appointment, patient has been doing well with longer course of antibiotics, no longer having any dysuria.  He denies any issues with chest pain, fever, chills, sweats.  He has not had any significant GI upset or issues with diarrhea.  Continues to tolerate antibiotics without issue at this time.  Given that he has been doing better, he feels that he does not need further evaluation with urology at this time Recommend continuing with course of antibiotics and taking to completion.  Still feel that he would benefit from evaluation with urology to ensure no other evaluation or testing is needed. Will plan for follow-up in about 3 to 4 months to monitor progress or sooner as needed  Return in about 3 months (around 06/21/2022).   ___________________________________________ Emrys Mck[place]ie de [place], MD, ABFM, [facility] Primary Care and [place]

## 2022-03-29 ENCOUNTER — Ambulatory Visit (INDEPENDENT_AMBULATORY_CARE_PROVIDER_SITE_OTHER): Payer: Medicare Other | Admitting: Family Medicine

## 2022-03-29 ENCOUNTER — Encounter (HOSPITAL_BASED_OUTPATIENT_CLINIC_OR_DEPARTMENT_OTHER): Payer: Self-pay | Admitting: Family Medicine

## 2022-03-29 VITALS — BP 120/56 | HR 50 | Ht 72.0 in | Wt 239.0 lb

## 2022-03-29 DIAGNOSIS — M19011 Primary osteoarthritis, right shoulder: Secondary | ICD-10-CM | POA: Diagnosis not present

## 2022-03-29 NOTE — Assessment & Plan Note (Addendum)
Patient presents today with worsening right shoulder pain.  In November 2023, patient was having right shoulder pain and we completed subacromial bursa injection of right shoulder. Patient did do well with prior injection initially with some improvement in symptoms and after procedure.  Unfortunately, recently he did notice some aggravation of right shoulder with certain movements and is now having ongoing right shoulder pain. On exam, patient is in mild discomfort, he does have some tenderness to palpation through periscapular muscles on right side.  He does have some limitation in range of motion both active and passive related to pain. Reviewed prior imaging completed several months ago with notable joint space narrowing, evidence of arthritis. We discussed options today, consideration for given injection, however would proceed with glenohumeral joint injection at this time.  He would be interested and we will arrange to complete this in the near future.  We will plan to complete ultrasound-guided glenohumeral joint injection, discussed procedure as well as potential risks and benefits.  Also discussed consideration of referral to orthopedic surgeon and patient would prefer to hold off for now if possible, but consider this should persist despite conservative measures and procedural interventions.  Plan for ultrasound-guided glenohumeral and joint injection this week and arrange for follow-up office visit in about 3 months to monitor progress or sooner as needed

## 2022-03-29 NOTE — Progress Notes (Signed)
    Procedures performed today:    None.  Independent interpretation of notes and tests performed by another provider:   None.  Brief History, Exam, Impression, and Recommendations:    BP (!) 120/56 (BP Location: Left Arm, Patient Position: Sitting, Cuff Size: Large)   Pulse (!) 50   Ht 6' (1.829 m)   Wt 239 lb (108.4 kg)   SpO2 100%   BMI 32.41 kg/m   Osteoarthritis of right shoulder Patient presents today with worsening right shoulder pain.  In November 2023, patient was having right shoulder pain and we completed subacromial bursa injection of right shoulder. Patient did do well with prior injection initially with some improvement in symptoms and after procedure.  Unfortunately, recently he did notice some aggravation of right shoulder with certain movements and is now having ongoing right shoulder pain. On exam, patient is in mild discomfort, he does have some tenderness to palpation through periscapular muscles on right side.  He does have some limitation in range of motion both active and passive related to pain. Reviewed prior imaging completed several months ago with notable joint space narrowing, evidence of arthritis. We discussed options today, consideration for given injection, however would proceed with glenohumeral joint injection at this time.  He would be interested and we will arrange to complete this in the near future.  We will plan to complete ultrasound-guided glenohumeral joint injection, discussed procedure as well as potential risks and benefits.  Also discussed consideration of referral to orthopedic surgeon and patient would prefer to hold off for now if possible, but consider this should persist despite conservative measures and procedural interventions.  Plan for ultrasound-guided glenohumeral and joint injection this week and arrange for follow-up office visit in about 3 months to monitor progress or sooner as needed  Return in about 3 months (around  06/28/2022).   ___________________________________________ Eloyce Bultman de [place], MD, ABFM, CA[facility] and [place]

## 2022-03-30 ENCOUNTER — Ambulatory Visit (INDEPENDENT_AMBULATORY_CARE_PROVIDER_SITE_OTHER): Payer: Medicare Other | Admitting: Family Medicine

## 2022-03-30 ENCOUNTER — Ambulatory Visit (HOSPITAL_BASED_OUTPATIENT_CLINIC_OR_DEPARTMENT_OTHER): Payer: Self-pay

## 2022-03-30 DIAGNOSIS — M19011 Primary osteoarthritis, right shoulder: Secondary | ICD-10-CM

## 2022-03-30 NOTE — Progress Notes (Signed)
    Procedures performed today:    Procedure: Real-time Ultrasound Guided injection of the right glenohumeral joint Device: Samsung HS60  Verbal informed consent obtained.  Time-out conducted.  Noted no overlying erythema, induration, or other signs of local infection.  Skin prepped in a sterile fashion.  Local anesthesia: None. With sterile technique and under real time ultrasound guidance: 1 cc Kenalog 40, 3 cc lidocaine 1% without epinephrine injected easily Completed without difficulty  Advised to call if fevers/chills, erythema, induration, drainage, or persistent bleeding.  Images permanently stored and available for review in PACS.  Impression: Technically successful ultrasound guided injection.  Independent interpretation of notes and tests performed by another provider:   None.  Brief History, Exam, Impression, and Recommendations:    Osteoarthritis of right shoulder Patient presents for previously discussed ultrasound-guided glenohumeral joint injection of right shoulder.  He would like to proceed with planned injection today, again reviewed procedure, potential risks and benefits.  He did not have any additional questions today.  Procedure note above, patient tolerated procedure well.  We will plan for follow-up in about 3 to 4 months to assess progress or sooner as needed   ___________________________________________ Torian Quintero [place], MD, ABFM, [facility] and [place]

## 2022-03-30 NOTE — Assessment & Plan Note (Signed)
Patient presents for previously discussed ultrasound-guided glenohumeral joint injection of right shoulder.  He would like to proceed with planned injection today, again reviewed procedure, potential risks and benefits.  He did not have any additional questions today.  Procedure note above, patient tolerated procedure well.  We will plan for follow-up in about 3 to 4 months to assess progress or sooner as needed

## 2022-04-17 ENCOUNTER — Other Ambulatory Visit: Payer: Self-pay

## 2022-04-17 ENCOUNTER — Emergency Department (HOSPITAL_BASED_OUTPATIENT_CLINIC_OR_DEPARTMENT_OTHER)
Admission: EM | Admit: 2022-04-17 | Discharge: 2022-04-18 | Disposition: A | Discharge status: Home / Self Care | Payer: Medicare Other | Attending: Emergency Medicine | Admitting: Emergency Medicine

## 2022-04-17 ENCOUNTER — Emergency Department (HOSPITAL_BASED_OUTPATIENT_CLINIC_OR_DEPARTMENT_OTHER): Payer: Medicare Other

## 2022-04-17 DIAGNOSIS — Z6830 Body mass index (BMI) 30.0-30.9, adult: Secondary | ICD-10-CM | POA: Insufficient documentation

## 2022-04-17 DIAGNOSIS — R9431 Abnormal electrocardiogram [ECG] [EKG]: Secondary | ICD-10-CM | POA: Insufficient documentation

## 2022-04-17 DIAGNOSIS — R1111 Vomiting without nausea: Secondary | ICD-10-CM | POA: Diagnosis not present

## 2022-04-17 DIAGNOSIS — U071 COVID-19: Secondary | ICD-10-CM | POA: Diagnosis not present

## 2022-04-17 DIAGNOSIS — R5383 Other fatigue: Secondary | ICD-10-CM | POA: Diagnosis not present

## 2022-04-17 DIAGNOSIS — R110 Nausea: Secondary | ICD-10-CM | POA: Diagnosis not present

## 2022-04-17 DIAGNOSIS — Z951 Presence of aortocoronary bypass graft: Secondary | ICD-10-CM | POA: Diagnosis not present

## 2022-04-17 DIAGNOSIS — R531 Weakness: Secondary | ICD-10-CM | POA: Diagnosis not present

## 2022-04-17 LAB — BASIC METABOLIC PANEL
Anion gap: 9 (ref 5–15)
BUN: 17 mg/dL (ref 8–23)
CO2: 23 mmol/L (ref 22–32)
Calcium: 9.2 mg/dL (ref 8.9–10.3)
Chloride: 103 mmol/L (ref 98–111)
Creatinine, Ser: 0.99 mg/dL (ref 0.61–1.24)
GFR, Estimated: >60 mL/min (ref >60)
Glucose, Bld: 134 mg/dL — ABNORMAL HIGH (ref 70–99)
Potassium: 3.6 mmol/L (ref 3.5–5.1)
Sodium: 135 mmol/L (ref 135–145)

## 2022-04-17 LAB — CBC WITH DIFFERENTIAL/PLATELET
Abs Immature Granulocytes: 0.02 10*3/uL (ref 0.00–0.07)
Basophils Absolute: 0 10*3/uL (ref 0.0–0.1)
Basophils Relative: 0 %
Eosinophils Absolute: 0 10*3/uL (ref 0.0–0.5)
Eosinophils Relative: 0 %
HCT: 35.7 % — ABNORMAL LOW (ref 39.0–52.0)
Hemoglobin: 12.3 g/dL — ABNORMAL LOW (ref 13.0–17.0)
Immature Granulocytes: 0 %
Lymphocytes Relative: 8 %
Lymphs Abs: 0.4 10*3/uL — ABNORMAL LOW (ref 0.7–4.0)
MCH: 30.6 pg (ref 26.0–34.0)
MCHC: 34.5 g/dL (ref 30.0–36.0)
MCV: 88.8 fL (ref 80.0–100.0)
Monocytes Absolute: 0.3 10*3/uL (ref 0.1–1.0)
Monocytes Relative: 7 %
Neutro Abs: 3.8 10*3/uL (ref 1.7–7.7)
Neutrophils Relative %: 85 %
Platelets: 116 10*3/uL — ABNORMAL LOW (ref 150–400)
RBC: 4.02 MIL/uL — ABNORMAL LOW (ref 4.22–5.81)
RDW: 13.4 % (ref 11.5–15.5)
WBC: 4.6 10*3/uL (ref 4.0–10.5)
nRBC: 0 % (ref 0.0–0.2)

## 2022-04-17 LAB — RESP PANEL BY RT-PCR (RSV, FLU A&B, COVID)  RVPGX2
Influenza A by PCR: NEGATIVE
Influenza B by PCR: NEGATIVE
Resp Syncytial Virus by PCR: NEGATIVE
SARS Coronavirus 2 by RT PCR: POSITIVE — AB

## 2022-04-17 LAB — BRAIN NATRIURETIC PEPTIDE: B Natriuretic Peptide: 341.1 pg/mL — ABNORMAL HIGH (ref 0.0–100.0)

## 2022-04-17 LAB — TROPONIN I (HIGH SENSITIVITY): Troponin I (High Sensitivity): 16 ng/L (ref <18)

## 2022-04-17 MED ORDER — ACETAMINOPHEN 500 MG PO TABS
1000.0000 mg | ORAL_TABLET | Freq: Once | ORAL | Status: AC
  Administered 2022-04-17: 1000 mg via ORAL
  Filled 2022-04-17: qty 2

## 2022-04-17 MED ORDER — LACTATED RINGERS IV BOLUS
1000.0000 mL | Freq: Once | INTRAVENOUS | Status: AC
  Administered 2022-04-17: 1000 mL via INTRAVENOUS

## 2022-04-17 MED ORDER — PAXLOVID (300/100) 20 X 150 MG & 10 X 100MG PO TBPK: 3.0000 | ORAL_TABLET | Freq: Two times a day (BID) | ORAL | 0 refills | Status: AC

## 2022-04-17 NOTE — ED Triage Notes (Signed)
Pt in from home via [facility] with fatigue and low grade fever for a few days, states he has been around 2 family members that have Covid. Pt states he feels generalized weakness, says he slid off the bed this evening, no LOC, injuries or thinners. Took '650mg'$  tylenol around 9pm. A&ox4  VS en route: 128/74 74HR 20 96%RA 99.3

## 2022-04-17 NOTE — ED Provider Notes (Signed)
[facility] Provider Note   CSN: [id number] Arrival date & time: 04/17/22  2137     History Chief Complaint  Patient presents with   Fatigue   Covid symptoms    HPI Jeff Johnson is a 77 y.o. male presenting for chief complaint of malaise and fatigue over the last 4 hours.  He is a 77 year old male with an exquisitely comorbid medical history including ACS status post CABG, morbid obesity.  States that he has had weakness tonight felt too weak to get out of bed after being exposed to COVID throughout the week this week.  No medications prior to arrival.  Denies any history of COPD or pulmonary disease in the past..   Patient's recorded medical, surgical, social, medication list and allergies were reviewed in the Snapshot window as part of the initial history.   Review of Systems   Review of Systems  Constitutional:  Negative for chills and fever.  HENT:  Negative for ear pain and sore throat.   Eyes:  Negative for pain and visual disturbance.  Respiratory:  Positive for shortness of breath. Negative for cough.   Cardiovascular:  Negative for chest pain and palpitations.  Gastrointestinal:  Negative for abdominal pain and vomiting.  Genitourinary:  Negative for dysuria and hematuria.  Musculoskeletal:  Negative for arthralgias and back pain.  Skin:  Negative for color change and rash.  Neurological:  Negative for seizures and syncope.  All other systems reviewed and are negative.   Physical Exam Updated Vital Signs BP 118/60 (BP Location: Right Arm)   Pulse 61   Temp 98.8 F (37.1 C) (Oral)   Resp 20   Wt 101.2 kg   SpO2 97%   BMI 30.24 kg/m  Physical Exam Vitals and nursing note reviewed.  Constitutional:      General: He is not in acute distress.    Appearance: He is well-developed.  HENT:     Head: Normocephalic and atraumatic.  Eyes:     Conjunctiva/sclera: Conjunctivae normal.  Cardiovascular:     Rate and Rhythm:  Normal rate and regular rhythm.     Heart sounds: No murmur heard. Pulmonary:     Effort: Pulmonary effort is normal. No respiratory distress.     Breath sounds: Normal breath sounds.  Abdominal:     Palpations: Abdomen is soft.     Tenderness: There is no abdominal tenderness.  Musculoskeletal:        General: No swelling.     Cervical back: Neck supple.  Skin:    General: Skin is warm and dry.     Capillary Refill: Capillary refill takes less than 2 seconds.  Neurological:     Mental Status: He is alert.  Psychiatric:        Mood and Affect: Mood normal.      ED Course/ Medical Decision Making/ A&P    Procedures Procedures   Medications Ordered in ED Medications  lactated ringers bolus 1,000 mL (0 mLs Intravenous Stopped 04/17/22 2352)  acetaminophen (TYLENOL) tablet 1,000 mg (1,000 mg Oral Given 04/17/22 2250)    Medical Decision Making:    Jeff Johnson is a 77 y.o. male who presented to the ED today with generalized malaise and fatigue detailed above.     Patient's presentation is complicated by their history of advanced age and multiple comorbid medical conditions.  Patient placed on continuous vitals and telemetry monitoring while in ED which was reviewed periodically.   Complete  initial physical exam performed, notably the patient  was HDS in NAD.      Reviewed and confirmed nursing documentation for past medical history, family history, social history.    Initial Assessment:   With the patient's presentation of malaise, most likely diagnosis is viral syndrome.  He has had multiple exposures to coronavirus and endorses sneezing cough congestion as well as his malaise.  However he is also endorsing some dyspnea on exertion and fatigue.  His oxygen saturations are 97% on room air and he is not tachycardic therefore pulmonary embolism seems less likely. Other diagnoses were considered including (but not limited to) pneumonia, pneumothorax, critical anemia, metabolic  derangement. These are considered less likely due to history of present illness and physical exam findings.   This is most consistent with an acute life/limb threatening illness complicated by underlying chronic conditions. Patient is denying any active chest pain at this time. Initial Plan:  Viral screening Screening labs including CBC and Metabolic panel to evaluate for infectious or metabolic etiology of disease.  CXR to evaluate for structural/infectious intrathoracic pathology.  BNP/troponin/EKG to evaluate for cardiac pathology. Objective evaluation as below reviewed with plan for close reassessment  Initial Study Results:   Laboratory  All laboratory results reviewed without evidence of clinically relevant pathology.     EKG EKG was reviewed independently. Rate, rhythm, axis, intervals all examined and without medically relevant abnormality. ST segments without concerns for elevations.    Radiology  All images reviewed independently. Agree with radiology report at this time.   DG Chest Portable 1 View  Result Date: 04/17/2022 CLINICAL DATA:  Weakness. COVID symptoms. Family members are positive for COVID. EXAM: PORTABLE CHEST 1 VIEW COMPARISON:  02/09/2022 FINDINGS: Shallow inspiration. Heart size and pulmonary vascularity are normal for technique. Lungs are clear. No pleural effusions. No pneumothorax. Mediastinal contours appear intact. Postoperative changes in the mediastinum. Degenerative changes in the spine and shoulders. IMPRESSION: Shallow inspiration.  No evidence of active pulmonary disease. Electronically Signed   By: Lucienne Capers M.D.   On: 04/17/2022 22:45    Final Assessment and Plan:   Reevaluated at bedside.  Symptoms grossly improved after Tylenol and IV fluids.  Patient still feels weak but is in no acute distress.  Patient's wife is at bedside and will assist with providing support while he gets through this initial COVID infection.  We discussed risk factors for  proceeding and worsening of disease.  He meets criteria for antiviral therapy.  Started on Paxlovid and otherwise stable for discharge with outpatient care and management.   Disposition:  I have considered need for hospitalization, however, considering all of the above, I believe this patient is stable for discharge at this time.  Patient/family educated about specific return precautions for given chief complaint and symptoms.  Patient/family educated about follow-up with PCP.     Patient/family expressed understanding of return precautions and need for follow-up. Patient spoken to regarding all imaging and laboratory results and appropriate follow up for these results. All education provided in verbal form with additional information in written form. Time was allowed for answering of patient questions. Patient discharged.    Emergency Department Medication Summary:   Medications  lactated ringers bolus 1,000 mL (0 mLs Intravenous Stopped 04/17/22 2352)  acetaminophen (TYLENOL) tablet 1,000 mg (1,000 mg Oral Given 04/17/22 2250)         Clinical Impression:  1. COVID      Discharge   Final Clinical Impression(s) / ED Diagnoses Final  diagnoses:  COVID    Rx / DC Orders ED Discharge Orders          Ordered    nirmatrelvir & ritonavir (PAXLOVID, 300/100,) 20 x 150 MG & 10 x '100MG'$  TBPK  2 times daily        04/17/22 2330              Tretha Sciara, MD 04/18/22 1450

## 2022-04-22 ENCOUNTER — Other Ambulatory Visit (HOSPITAL_BASED_OUTPATIENT_CLINIC_OR_DEPARTMENT_OTHER): Payer: Self-pay

## 2022-04-29 ENCOUNTER — Other Ambulatory Visit (HOSPITAL_BASED_OUTPATIENT_CLINIC_OR_DEPARTMENT_OTHER): Payer: Self-pay

## 2022-04-29 ENCOUNTER — Ambulatory Visit (INDEPENDENT_AMBULATORY_CARE_PROVIDER_SITE_OTHER): Payer: Medicare Other | Admitting: Family Medicine

## 2022-04-29 ENCOUNTER — Encounter (HOSPITAL_BASED_OUTPATIENT_CLINIC_OR_DEPARTMENT_OTHER): Payer: Self-pay | Admitting: Family Medicine

## 2022-04-29 VITALS — BP 169/73 | HR 56 | Ht 72.0 in | Wt 240.0 lb

## 2022-04-29 DIAGNOSIS — I10 Essential (primary) hypertension: Secondary | ICD-10-CM | POA: Diagnosis not present

## 2022-04-29 MED ORDER — LOSARTAN POTASSIUM 50 MG PO TABS
50.0000 mg | ORAL_TABLET | Freq: Every day | ORAL | 1 refills | Status: DC
  Filled 2022-04-29: qty 60, 60d supply, fill #0

## 2022-04-29 NOTE — Patient Instructions (Signed)
Increase Losartan to 50 mg and continue monitoring blood pressure at home.  Bring blood pressure log to follow-up visit in one month.

## 2022-04-29 NOTE — Assessment & Plan Note (Addendum)
Is taking losartan 25 mg and metoprolol 25 mg as prescribed.  He reports that he got COVID 2 weeks ago and since that time his blood pressure has been elevated.  He has been checking it at home he has been getting 200/120, 179/97, usually runs in the 110s/60;s. It is 169/73 in office today.  He denies chest pain, new shortness of breath, lower extremity edema, vision changes, he endorses some pressure in his head since having COVID.  He associates this with his increased blood pressure.  He will continue monitoring his blood pressure at home, will increase losartan dose to 50 mg daily, he will keep a blood pressure log and bring at his next follow-up visit with PCP.  Understands that if his blood pressure normalizes  he can continue taking the losartan 25 mg again as previously prescribed. Follow-up in one month, sooner if anything changes.

## 2022-04-29 NOTE — Progress Notes (Signed)
Established Patient Office Visit  Subjective   Patient ID: Jeff Johnson, male    DOB: July 01, 1945  Age: 77 y.o. MRN: [id number]  Chief Complaint  Patient presents with   Hypertension    Pt here for having elevated blood pressure readings at home     HPI  Hypertension Medication compliance: losartan 25 mg and metoprolol 25 mg as prescribed.  Denies chest pain,no new shortness of breath, lower extremity edema, vision changes, pressure in head since having Covid.  Pertinent lab work: 02/22/22 Monitoring at home: 200/120, 179/97, usually runs 110/60's  Tolerating medication well: tolerating medications well, blood pressure not controlled since getting now.  Continue current medication regimen: increase losartan to 50 mg daily  Follow-up: one week  Had Covid 2 weeks ago and blood pressure has been high since that time. Still recovering from Covid.    Review of Systems  Eyes:  Negative for blurred vision and double vision.  Respiratory:  Positive for shortness of breath (now new).   Cardiovascular:  Negative for chest pain and leg swelling.  Neurological:  Positive for headaches.      Objective:     BP (!) 169/73 (BP Location: Left Arm, Patient Position: Sitting, Cuff Size: Large)   Pulse (!) 56   Ht 6' (1.829 m)   Wt 240 lb (108.9 kg)   SpO2 98%   BMI 32.55 kg/m  BP Readings from Last 3 Encounters:  04/29/22 (!) 169/73  04/17/22 118/60  03/29/22 (!) 120/56      Physical Exam Vitals and nursing note reviewed.  Constitutional:      General: He is not in acute distress.    Appearance: Normal appearance.  Cardiovascular:     Rate and Rhythm: Regular rhythm.     Heart sounds: Normal heart sounds.  Pulmonary:     Effort: Pulmonary effort is normal.     Breath sounds: Normal breath sounds.  Skin:    General: Skin is warm and dry.     Capillary Refill: Capillary refill takes less than 2 seconds.  Neurological:     General: No focal deficit present.     Mental  Status: He is alert. Mental status is at baseline.  Psychiatric:        Mood and Affect: Mood normal.        Behavior: Behavior normal.        Thought Content: Thought content normal.        Judgment: Judgment normal.     No results found for any visits on 04/29/22.    The 10-year ASCVD risk score (Arnett DK, et al., 2019) is: 48.9%    Assessment & Plan:   Problem List Items Addressed This Visit     Elevated blood pressure reading in office with diagnosis of hypertension - Primary    Is taking losartan 25 mg and metoprolol 25 mg as prescribed.  He reports that he got COVID 2 weeks ago and since that time his blood pressure has been elevated.  He has been checking it at home he has been getting 200/120, 179/97, usually runs in the 110s/60;s. It is 169/73 in office today.  He denies chest pain, new shortness of breath, lower extremity edema, vision changes, he endorses some pressure in his head since having COVID.  He associates this with his increased blood pressure.  He will continue monitoring his blood pressure at home, will increase losartan dose to 50 mg daily, he will keep a blood pressure log  and bring at his next follow-up visit with PCP.  Understands that if his blood pressure normalizes  he can continue taking the losartan 25 mg again as previously prescribed. Follow-up in one month, sooner if anything changes.       Relevant Medications   losartan (COZAAR) 50 MG tablet    Return in about 4 weeks (around 05/27/2022) for blood pressure, bring blood pressue log.    Chalmers Guest, FNP

## 2022-04-30 ENCOUNTER — Other Ambulatory Visit (HOSPITAL_BASED_OUTPATIENT_CLINIC_OR_DEPARTMENT_OTHER): Payer: Self-pay

## 2022-05-02 ENCOUNTER — Emergency Department (HOSPITAL_BASED_OUTPATIENT_CLINIC_OR_DEPARTMENT_OTHER)
Admission: EM | Admit: 2022-05-02 | Discharge: 2022-05-02 | Disposition: A | Discharge status: Home / Self Care | Payer: Medicare Other | Attending: Emergency Medicine | Admitting: Emergency Medicine

## 2022-05-02 ENCOUNTER — Other Ambulatory Visit (HOSPITAL_BASED_OUTPATIENT_CLINIC_OR_DEPARTMENT_OTHER): Payer: Self-pay

## 2022-05-02 ENCOUNTER — Other Ambulatory Visit: Payer: Self-pay

## 2022-05-02 ENCOUNTER — Encounter (HOSPITAL_BASED_OUTPATIENT_CLINIC_OR_DEPARTMENT_OTHER): Payer: Self-pay | Admitting: Emergency Medicine

## 2022-05-02 ENCOUNTER — Emergency Department (HOSPITAL_BASED_OUTPATIENT_CLINIC_OR_DEPARTMENT_OTHER): Payer: Medicare Other

## 2022-05-02 DIAGNOSIS — Z8616 Personal history of COVID-19: Secondary | ICD-10-CM | POA: Insufficient documentation

## 2022-05-02 DIAGNOSIS — Z7982 Long term (current) use of aspirin: Secondary | ICD-10-CM | POA: Diagnosis not present

## 2022-05-02 DIAGNOSIS — I10 Essential (primary) hypertension: Secondary | ICD-10-CM | POA: Diagnosis not present

## 2022-05-02 DIAGNOSIS — R519 Headache, unspecified: Secondary | ICD-10-CM | POA: Diagnosis not present

## 2022-05-02 DIAGNOSIS — Z043 Encounter for examination and observation following other accident: Secondary | ICD-10-CM | POA: Diagnosis not present

## 2022-05-02 DIAGNOSIS — W0110XA Fall on same level from slipping, tripping and stumbling with subsequent striking against unspecified object, initial encounter: Secondary | ICD-10-CM | POA: Diagnosis not present

## 2022-05-02 DIAGNOSIS — Z79899 Other long term (current) drug therapy: Secondary | ICD-10-CM | POA: Insufficient documentation

## 2022-05-02 DIAGNOSIS — R42 Dizziness and giddiness: Secondary | ICD-10-CM | POA: Diagnosis not present

## 2022-05-02 DIAGNOSIS — W19XXXA Unspecified fall, initial encounter: Secondary | ICD-10-CM

## 2022-05-02 LAB — CBC
HCT: 39.3 % (ref 39.0–52.0)
Hemoglobin: 13.3 g/dL (ref 13.0–17.0)
MCH: 30.3 pg (ref 26.0–34.0)
MCHC: 33.8 g/dL (ref 30.0–36.0)
MCV: 89.5 fL (ref 80.0–100.0)
Platelets: 219 10*3/uL (ref 150–400)
RBC: 4.39 MIL/uL (ref 4.22–5.81)
RDW: 13.2 % (ref 11.5–15.5)
WBC: 5.5 10*3/uL (ref 4.0–10.5)
nRBC: 0 % (ref 0.0–0.2)

## 2022-05-02 LAB — TROPONIN I (HIGH SENSITIVITY)
Troponin I (High Sensitivity): 12 ng/L (ref <18)
Troponin I (High Sensitivity): 10 ng/L (ref <18)

## 2022-05-02 LAB — BASIC METABOLIC PANEL
Anion gap: 10 (ref 5–15)
BUN: 18 mg/dL (ref 8–23)
CO2: 28 mmol/L (ref 22–32)
Calcium: 9.7 mg/dL (ref 8.9–10.3)
Chloride: 100 mmol/L (ref 98–111)
Creatinine, Ser: 0.94 mg/dL (ref 0.61–1.24)
GFR, Estimated: >60 mL/min (ref >60)
Glucose, Bld: 102 mg/dL — ABNORMAL HIGH (ref 70–99)
Potassium: 3.9 mmol/L (ref 3.5–5.1)
Sodium: 138 mmol/L (ref 135–145)

## 2022-05-02 LAB — BRAIN NATRIURETIC PEPTIDE: B Natriuretic Peptide: 184.2 pg/mL — ABNORMAL HIGH (ref 0.0–100.0)

## 2022-05-02 MED ORDER — HYDRALAZINE HCL 20 MG/ML IJ SOLN
5.0000 mg | Freq: Once | INTRAMUSCULAR | Status: AC
  Administered 2022-05-02: 5 mg via INTRAVENOUS
  Filled 2022-05-02: qty 1

## 2022-05-02 MED ORDER — HYDRALAZINE HCL 10 MG PO TABS
10.0000 mg | ORAL_TABLET | Freq: Three times a day (TID) | ORAL | 0 refills | Status: DC | PRN
  Filled 2022-05-02 – 2022-05-03 (×2): qty 60, 20d supply, fill #0

## 2022-05-02 NOTE — ED Triage Notes (Addendum)
Pt presents to ED Pov. Pt c/o HTN at home x2w. Pt reports he had covid 2w ago and bp went up and has not come down since. Pt reports that last night he had an assisted fall and bp went up even more since. Denies head injury, denies Loc. Highest by 210/11 at home. Took second htz this morning.

## 2022-05-02 NOTE — ED Provider Notes (Signed)
[facility] Provider Note   CSN: [id number] Arrival date & time: 05/02/22  [id number]     History  Chief Complaint  Patient presents with   Hypertension    Jeff Johnson is a 77 y.o. male.  HPI Reports that he has had problems with dizziness with standing and shortness of breath for a long time.  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  However, since having COVID about 2 weeks ago he has been dealing with persistently elevated blood pressure.  He reports that before that his blood pressures were normotensive to low.  He reports since he is getting pressure readings up in the 170s and 180s.  His physician increased his blood pressure medication a couple days ago.  Last night however he had blood pressures up to [id number] systolic which really made him concerned.  He did take that blood pressure apparently after falling.  He reports that he was bending over to put the leash on the dog and lost his balance and fell forward.  He did hit his head although he had no loss of consciousness.  He is not on blood thinners.  He takes a daily aspirin.  He reports he had a hard time getting up after that but his wife was able to help him and he has been up and ambulatory at baseline since.  He reports he does have a bit of a headache behind the left eye.  No confusion no change from baseline.    Home Medications Prior to Admission medications   Medication Sig Start Date End Date Taking? Authorizing Provider  hydrALAZINE (APRESOLINE) 10 MG tablet Take 1 tablet (10 mg total) by mouth 3 (three) times daily as needed. 05/02/22  Yes Charlesetta Shanks, MD  acetaminophen (TYLENOL) 650 MG CR tablet Take 1,300 mg by mouth daily.     [provider]  ASPIRIN 81 PO Take 1 tablet by mouth daily.    [provider]  Cholecalciferol (VITAMIN D PO) Take 1 tablet by mouth daily.    [provider]  influenza vaccine adjuvanted (FLUAD) 0.5  ML injection Inject into the muscle. 11/19/21     lansoprazole (PREVACID) 15 MG capsule Take 1 capsule (15 mg total) by mouth daily at 12 noon. 02/08/22   de [place], Raymond J, MD  losartan (COZAAR) 25 MG tablet TAKE 1 TABLET BY MOUTH DAILY FOR BLOOD PRESSURE 02/08/22   [place], Blondell Reveal, MD  losartan (COZAAR) 50 MG tablet Take 1 tablet (50 mg total) by mouth daily. 04/29/22   Chalmers Guest, FNP  metoprolol tartrate (LOPRESSOR) 25 MG tablet Take 0.5 tablets (12.5 mg total) by mouth 2 (two) times daily. 03/16/22   [place], Blondell Reveal, MD  montelukast (SINGULAIR) 10 MG tablet Take 1 tablet (10 mg total) by mouth daily. 03/16/22   [place], Raymond J, MD  RSV vaccine recomb adjuvanted (AREXVY) 120 MCG/0.5ML injection Inject into the muscle. 11/19/21     simvastatin (ZOCOR) 20 MG tablet Take 1 tablet (20 mg total) by mouth daily. 03/16/22   [place], Blondell Reveal, MD  tamsulosin (FLOMAX) 0.4 MG CAPS capsule Take 1 capsule (0.4 mg total) by mouth daily. 02/09/22   [place], Raymond J, MD      Allergies    Beta adrenergic blockers, Iodinated contrast media, Shellfish allergy, and Shellfish-derived products    Review of Systems   Review of Systems  Physical Exam Updated Vital  Signs BP (!) 144/81   Pulse 69   Temp 97.6 F (36.4 C) (Oral)   Resp 19   SpO2 98%  Physical Exam Constitutional:      Comments: Alert nontoxic.  Mental status clear.  No respiratory distress at rest.  HENT:     Head: Normocephalic and atraumatic.     Mouth/Throat:     Pharynx: Oropharynx is clear.  Eyes:     Extraocular Movements: Extraocular movements intact.  Cardiovascular:     Rate and Rhythm: Normal rate and regular rhythm.     Comments: 2/6 systolic ejection murmur. Pulmonary:     Effort: Pulmonary effort is normal.     Breath sounds: Normal breath sounds.  Abdominal:     General: There is no distension.     Palpations: Abdomen is soft.     Tenderness: There is no abdominal tenderness. There is no guarding.   Musculoskeletal:     Right lower leg: No edema.     Left lower leg: No edema.     Comments: No peripheral edema.  Calves are soft and nontender.  Skin:    General: Skin is warm and dry.  Neurological:     General: No focal deficit present.     Mental Status: He is oriented to person, place, and time.     Motor: No weakness.     Coordination: Coordination normal.  Psychiatric:        Mood and Affect: Mood normal.     ED Results / Procedures / Treatments   Labs (all labs ordered are listed, but only abnormal results are displayed) Labs Reviewed  BASIC METABOLIC PANEL - Abnormal; Notable for the following components:      Result Value   Glucose, Bld 102 (*)    All other components within normal limits  BRAIN NATRIURETIC PEPTIDE - Abnormal; Notable for the following components:   B Natriuretic Peptide 184.2 (*)    All other components within normal limits  CBC  TROPONIN I (HIGH SENSITIVITY)  TROPONIN I (HIGH SENSITIVITY)    EKG EKG Interpretation  Date/Time:  Sunday May 02 2022 11:00:51 EST Ventricular Rate:  54 PR Interval:  231 QRS Duration: 99 QT Interval:  487 QTC Calculation: 462 R Axis:   -8 Text Interpretation: Sinus rhythm Prolonged PR interval agree, Confirmed by Charlesetta Shanks [phone]) on 05/02/2022 12:44:43 PM  Radiology CT Head Wo Contrast  Result Date: 05/02/2022 CLINICAL DATA:  Hypertension.  Fall last night. EXAM: CT HEAD WITHOUT CONTRAST TECHNIQUE: Contiguous axial images were obtained from the base of the skull through the vertex without intravenous contrast. RADIATION DOSE REDUCTION: This exam was performed according to the departmental dose-optimization program which includes automated exposure control, adjustment of the mA and/or kV according to patient size and/or use of iterative reconstruction technique. COMPARISON:  Head CT, 01/14/2018. FINDINGS: Brain: No evidence of acute infarction, hemorrhage, hydrocephalus, extra-axial collection or mass  lesion/mass effect. Vascular: No hyperdense vessel or unexpected calcification. Skull: Normal. Negative for fracture or focal lesion. Sinuses/Orbits: Globes and orbits are unremarkable. Mild left frontal sinus mucosal thickening with a small amount of dependent fluid. Mild scattered middle and anterior ethmoid air cell mucosal thickening. Other: None. IMPRESSION: 1. No acute intracranial abnormalities. Electronically Signed   By: Lajean Manes M.D.   On: 05/02/2022 11:30    Procedures Procedures    Medications Ordered in ED Medications  hydrALAZINE (APRESOLINE) injection 5 mg (5 mg Intravenous Given 05/02/22 1236)    ED Course/ Medical  Decision Making/ A&P                             Medical Decision Making Amount and/or Complexity of Data Reviewed Labs: ordered. Radiology: ordered.  Risk Prescription drug management.   Patient describes having problems with blood pressure management for several weeks.  He recently had his Cozaar dose increased in the past 3 days from 25 mg to 50 mg..  Reports blood pressures were significantly elevated this morning.  Was up to 210/100.  However, this was shortly after he had fallen.  He reports that he is feeling kind of dizzy and off balance for many years.  Will proceed with CT head.  Patient has advanced age and possible intracranial injury.  Patient is not exhibiting signs of endorgan damage.  He is alert with clear mental status no visual changes no chest pain.  Will proceed with basic diagnostic evaluation for her tensive urgency with EKG cardiac enzymes and metabolic panel.  CT head returned reviewed by radiology no acute findings.  EG reviewed by myself sinus rhythm unchanged from previous no ischemic changes no new dysrhythmia.  I did get a repeat EKG as the first 1 had very low voltage P waves and machine read was for A-fib but this was not accurate.  Patient is in sinus rhythm.  Basic chemistry panel and troponin within normal  limits.  Patient's blood pressure remains in [id number] to A999333 systolic.  Will give a low-dose of hydralazine.  With 88m hydralazine the patient's blood pressure is normalized to 144/81.  He feels well.  He is at normal baseline mental status.  At this time I feel patient is stable for discharge.  We reviewed the plan with the patient continuing his regular blood pressure medications.  On metoprolol he is bradycardic in the mid 50s but not symptomatic.  Patient's Cozaar is now at 50 mg daily.  He will continue this and continue his hydrochlorothiazide as previously prescribed.  We reviewed the plan of as needed hydralazine as he stabilizes on his routine medications.  He is aware that he will need follow-up for either discontinuation or addition of hydralazine as a permanent medication.       Final Clinical Impression(s) / ED Diagnoses Final diagnoses:  Essential hypertension  Fall, initial encounter    Rx / DC Orders ED Discharge Orders          Ordered    hydrALAZINE (APRESOLINE) 10 MG tablet  3 times daily PRN        05/02/22 1321              PCharlesetta Shanks MD 05/02/22 1336

## 2022-05-02 NOTE — Discharge Instructions (Addendum)
1.  Continue all of your regularly prescribed blood pressure medications. 2.  Review dietary recommendations for hypertension diet. 3.  If your blood pressure is still greater than 150/90 after having taken your medications, you may add a dose of hydralazine 10 mg every 8 hours.  This is a starting low-dose of hydralazine.  Doctor will need to determine if this is a medication that you need to continue, increase the dose or discontinue if your blood pressure normalizes again. 4.  Schedule follow-up with your primary care doctor or your cardiologist as soon as possible to continue managing your blood pressure. 5.  Return to the emergency department immediately if you develop concerning symptoms such as a bad headache, visual changes, chest pain or other concerning symptoms.

## 2022-05-03 ENCOUNTER — Other Ambulatory Visit (HOSPITAL_BASED_OUTPATIENT_CLINIC_OR_DEPARTMENT_OTHER): Payer: Self-pay

## 2022-05-10 ENCOUNTER — Telehealth: Payer: Self-pay

## 2022-05-10 NOTE — Telephone Encounter (Signed)
        Patient  visited [facility] on 05/02/2022  for hypertension.   Telephone encounter attempt :  1st  A HIPAA compliant voice message was left requesting a return call.  Instructed patient to call back at [phone].   [facility] Resource Care Guide   [email or web address]  ?? [id number]   Website: [email or web address]  [email or web address]

## 2022-05-11 ENCOUNTER — Telehealth: Payer: Self-pay

## 2022-05-11 ENCOUNTER — Telehealth (HOSPITAL_BASED_OUTPATIENT_CLINIC_OR_DEPARTMENT_OTHER): Payer: Self-pay | Admitting: Family Medicine

## 2022-05-11 NOTE — Telephone Encounter (Signed)
Spoke to [place] she stated that KD request he comes in on Thursday set appt for : for Thursday 2/29

## 2022-05-11 NOTE — Telephone Encounter (Signed)
     Patient  visit on 05/02/2022  at [facility] was for hypertension.  Have you been able to follow up with your primary care physician? Yes  The patient was or was not able to obtain any needed medicine or equipment. No medication prescribed.  Are there diet recommendations that you are having difficulty following? No  Patient expresses understanding of discharge instructions and education provided has no other needs at this time. Yes   [facility] Resource Care Guide   [email or web address]  ?? [id number]   Website: [email or web address]  [email or web address]

## 2022-05-11 NOTE — Telephone Encounter (Signed)
Pt Lvm with Scheduling. Called pt back he wanted to see what he needed to do regarding when he was last in the ov FNP Santiago Glad proscribed the pt HTN he stated he went to ED and it was high they proscribed him more medication due to it being very high he stated spoke to [place] she stated she will give pt a call back after she speaks with KD on if she wants pt to come in or adjust medication. Please advise.

## 2022-05-13 ENCOUNTER — Encounter (HOSPITAL_BASED_OUTPATIENT_CLINIC_OR_DEPARTMENT_OTHER): Payer: Self-pay | Admitting: Family Medicine

## 2022-05-13 ENCOUNTER — Other Ambulatory Visit (HOSPITAL_BASED_OUTPATIENT_CLINIC_OR_DEPARTMENT_OTHER): Payer: Self-pay

## 2022-05-13 ENCOUNTER — Ambulatory Visit (INDEPENDENT_AMBULATORY_CARE_PROVIDER_SITE_OTHER): Payer: Medicare Other | Admitting: Family Medicine

## 2022-05-13 VITALS — BP 161/75 | HR 62 | Ht 72.0 in | Wt 236.0 lb

## 2022-05-13 DIAGNOSIS — I1A0 Resistant hypertension: Secondary | ICD-10-CM | POA: Diagnosis not present

## 2022-05-13 DIAGNOSIS — I10 Essential (primary) hypertension: Secondary | ICD-10-CM | POA: Diagnosis not present

## 2022-05-13 MED ORDER — HYDRALAZINE HCL 10 MG PO TABS
10.0000 mg | ORAL_TABLET | Freq: Three times a day (TID) | ORAL | 0 refills | Status: DC
  Filled 2022-05-13 – 2022-05-18 (×2): qty 90, 30d supply, fill #0

## 2022-05-13 MED ORDER — LOSARTAN POTASSIUM 50 MG PO TABS
50.0000 mg | ORAL_TABLET | Freq: Every day | ORAL | 2 refills | Status: DC
  Filled 2022-05-13 – 2022-07-16 (×2): qty 30, 30d supply, fill #0
  Filled 2022-07-16: qty 90, 90d supply, fill #0

## 2022-05-13 NOTE — Progress Notes (Signed)
Established Patient Office Visit  Subjective   Patient ID: IANN CLINGERMAN, male    DOB: 1946/02/16  Age: 77 y.o. MRN: [id number]  Chief Complaint  Patient presents with   Hypertension    Pt here for having high blood pressure, pt also stated he is having right shoulder pain     HPI Hypertension Medication compliance: taking losartan 50 mg QD, hydralazine 10 mg BID, metoprolol 25 mg QD.   Denies chest pain, shortness of breath, lower extremity edema, vision changes, headaches. Felt lightheaded before going to ED.  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 144/81. Reports being lightheaded, 212/90's, went to ED the next day. 206/ highest reading in ED.  ED added hydralazine TID as needed. Has been taking about two times per day when he feel his head feel "funny".   Had injection per PCP without resolution of symptoms. Will defer to PCP.   Review of Systems  Eyes:  Positive for double vision (has seen eye doctor for this). Negative for blurred vision.  Respiratory:  Positive for shortness of breath (not worse than usual).   Cardiovascular:  Negative for chest pain and leg swelling.  Neurological:  Negative for dizziness and headaches.       Had headache before going to ED  Psychiatric/Behavioral:  Negative for depression and suicidal ideas.       Objective:     BP (!) 161/75 (BP Location: Left Arm, Patient Position: Sitting, Cuff Size: Large)   Pulse 62   Ht 6' (1.829 m)   Wt 236 lb (107 kg)   SpO2 98%   BMI 32.01 kg/m  BP Readings from Last 3 Encounters:  05/13/22 (!) 161/75  05/02/22 (!) 144/81  04/29/22 (!) 169/73      Physical Exam Vitals and nursing note reviewed.  Constitutional:      General: He is not in acute  distress.    Appearance: Normal appearance.  Cardiovascular:     Rate and Rhythm: Regular rhythm.     Heart sounds: Normal heart sounds.  Pulmonary:     Effort: Pulmonary effort is normal.     Breath sounds: Normal breath sounds.  Musculoskeletal:     Right lower leg: No edema.     Left lower leg: No edema.  Skin:    General: Skin is warm and dry.  Neurological:     General: No focal deficit present.     Mental Status: He is alert. Mental status is at baseline.  Psychiatric:        Mood and Affect: Mood normal.        Behavior: Behavior normal.        Thought Content: Thought content normal.        Judgment: Judgment normal.     No results found for any visits on 05/13/22.  Last metabolic panel Lab Results  Component Value Date   GLUCOSE 102 (H) 05/02/2022   NA 138 05/02/2022   K 3.9 05/02/2022   CL 100 05/02/2022   CO2 28 05/02/2022   BUN 18 05/02/2022   CREATININE 0.94 05/02/2022   GFRNONAA >60 05/02/2022   CALCIUM 9.7 05/02/2022   PHOS 3.5 11/20/2007   PROT 6.7 02/09/2022   ALBUMIN 4.1 02/09/2022   LABGLOB 2.6 02/09/2022  AGRATIO 1.6 02/09/2022   BILITOT 3.0 (H) 02/09/2022   ALKPHOS 77 02/09/2022   AST 26 02/09/2022   ALT 22 02/09/2022   ANIONGAP 10 05/02/2022      The 10-year ASCVD risk score (Arnett DK, et al., 2019) is: 46%    Assessment & Plan:   Problem List Items Addressed This Visit     Elevated blood pressure reading in office with diagnosis of hypertension    Presents for follow-up for hypertension.  On 05/02/2022 patient's blood pressure was significantly elevated at home, he reports that he also had a headache, he reported to the emergency department for further evaluation.  On 04/29/2022, his losartan was increased to 50 mg due to elevated blood pressure in the office.  Patient reports that his blood pressure has not been controlled since he had COVID-19 infection.  Currently taking losartan 50 mg daily, hydralazine 10 mg up to 3 times a day as  needed, and metoprolol 25 mg daily.  He reports he has been taking the hydralazine 10 mg when his head feels "funny "he has been monitoring his blood pressure at home, he reports that his blood pressures have been 138-190/67-98.  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  Hydralazine 10 mg increased to 3 times daily while awaiting cardiology input.      Relevant Medications   hydrALAZINE (APRESOLINE) 10 MG tablet   losartan (COZAAR) 50 MG tablet   Resistant hypertension - Primary   Relevant Medications   hydrALAZINE (APRESOLINE) 10 MG tablet   losartan (COZAAR) 50 MG tablet   Other Relevant Orders   Ambulatory referral to Cardiology  Agrees with plan of care discussed.  Questions answered. Increase hydralazine 10 mg to TID while awaiting cardiology input.    Return for 1 month for HTN .    Chalmers Guest, FNP

## 2022-05-13 NOTE — Assessment & Plan Note (Signed)
Presents for follow-up for hypertension.  On 05/02/2022 patient's blood pressure was significantly elevated at home, he reports that he also had a headache, he reported to the emergency department for further evaluation.  On 04/29/2022, his losartan was increased to 50 mg due to elevated blood pressure in the office.  Patient reports that his blood pressure has not been controlled since he had COVID-19 infection.  Currently taking losartan 50 mg daily, hydralazine 10 mg up to 3 times a day as needed, and metoprolol 25 mg daily.  He reports he has been taking the hydralazine 10 mg when his head feels "funny "he has been monitoring his blood pressure at home, he reports that his blood pressures have been 138-190/67-98.  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  Hydralazine 10 mg increased to 3 times daily while awaiting cardiology input.

## 2022-05-13 NOTE — Patient Instructions (Signed)
Take hydralazine 10 mg three times per day.

## 2022-05-14 ENCOUNTER — Telehealth: Payer: Self-pay | Admitting: Cardiovascular Disease

## 2022-05-14 NOTE — Telephone Encounter (Signed)
Patient is requesting to switch from Dr. Acie Fredrickson to Dr. Oval Linsey to be at the [facility] location.

## 2022-05-18 ENCOUNTER — Other Ambulatory Visit (HOSPITAL_BASED_OUTPATIENT_CLINIC_OR_DEPARTMENT_OTHER): Payer: Self-pay

## 2022-05-21 ENCOUNTER — Other Ambulatory Visit (HOSPITAL_BASED_OUTPATIENT_CLINIC_OR_DEPARTMENT_OTHER): Payer: Self-pay

## 2022-05-27 ENCOUNTER — Encounter (HOSPITAL_BASED_OUTPATIENT_CLINIC_OR_DEPARTMENT_OTHER): Payer: Self-pay | Admitting: Family Medicine

## 2022-05-27 ENCOUNTER — Other Ambulatory Visit (HOSPITAL_BASED_OUTPATIENT_CLINIC_OR_DEPARTMENT_OTHER): Payer: Self-pay

## 2022-05-27 ENCOUNTER — Ambulatory Visit (INDEPENDENT_AMBULATORY_CARE_PROVIDER_SITE_OTHER): Payer: Medicare Other | Admitting: Family Medicine

## 2022-05-27 VITALS — BP 126/54 | HR 71 | Ht 72.0 in | Wt 236.0 lb

## 2022-05-27 DIAGNOSIS — I10 Essential (primary) hypertension: Secondary | ICD-10-CM

## 2022-05-27 DIAGNOSIS — R059 Cough, unspecified: Secondary | ICD-10-CM | POA: Diagnosis not present

## 2022-05-27 DIAGNOSIS — M19011 Primary osteoarthritis, right shoulder: Secondary | ICD-10-CM | POA: Diagnosis not present

## 2022-05-27 MED ORDER — BENZONATATE 100 MG PO CAPS
100.0000 mg | ORAL_CAPSULE | Freq: Three times a day (TID) | ORAL | 0 refills | Status: AC | PRN
  Filled 2022-05-27: qty 40, 14d supply, fill #0

## 2022-05-27 NOTE — Assessment & Plan Note (Signed)
Blood pressure at goal in office today.  Patient has been having some increased difficulty with elevated blood pressure readings which has led to emergency department visits since our last appointment.  He has also been seen in our office by nurse practitioner related to blood pressure readings.  He has had somewhat labile blood pressure readings with some readings being at goal and others being notably elevated.  He is currently asymptomatic.  Previously when he has had some of the elevated blood pressure readings in the recent past, he has had some dizziness and symptoms.  He is currently the process of arranging cardiology appointment here at [facility]. Discussed considerations today.  Given current blood pressure in the office, do not feel that any medication changes are needed at this time.  I do feel that follow-up with cardiology is appropriate and encouraged patient to continue with scheduling this.

## 2022-05-27 NOTE — Assessment & Plan Note (Signed)
Since last appointment with me, patient did have recent illness and was diagnosed with coronavirus infection.  He was treated with Paxlovid.  He generally is doing better, however continues to have cough present.  Cough is intermittently productive.  Otherwise, patient has been doing much better.  He has been utilizing cough drops to help with controlling symptoms, otherwise has not used any significant over-the-counter medications as he is concerned about potential interaction with current prescription medications On exam, patient is in no acute distress, intermittent coughing during encounter in office today.  Lungs are clear to auscultation bilaterally Discussed cough with patient, suspect postviral cough syndrome.  Given that lungs were normal on exam today, feel that acute illness such as pneumonia is less likely.  We did discuss possibility of proceeding with chest x-ray, however patient declines today which is reasonable.  Can proceed with use of Tessalon Perles to help with controlling cough, prescription sent to pharmacy on file

## 2022-05-27 NOTE — Assessment & Plan Note (Signed)
We previously completed ultrasound-guided injection of glenohumeral joint.  Patient did get some relief with this, however this was short-lived over a period of a couple weeks.  Pain has largely returned to baseline at this time.  Continues to have impact on daily activities, affects his sleep at night We discussed options today, he would be amenable to discussing underlying issue with orthopedic surgeon and reviewing treatment options.  He is somewhat hesitant regarding any surgical intervention at this time.  Will proceed with referral now in order to discuss options and to have patient establish with orthopedic surgeon

## 2022-05-27 NOTE — Progress Notes (Signed)
    Procedures performed today:    None.  Independent interpretation of notes and tests performed by another provider:   None.  Brief History, Exam, Impression, and Recommendations:    BP (!) 126/54 (BP Location: Left Arm, Patient Position: Sitting, Cuff Size: Large)   Pulse 71   Ht 6' (1.829 m)   Wt 236 lb (107 kg)   SpO2 100%   BMI 32.01 kg/m   Essential hypertension Blood pressure at goal in office today.  Patient has been having some increased difficulty with elevated blood pressure readings which has led to emergency department visits since our last appointment.  He has also been seen in our office by nurse practitioner related to blood pressure readings.  He has had somewhat labile blood pressure readings with some readings being at goal and others being notably elevated.  He is currently asymptomatic.  Previously when he has had some of the elevated blood pressure readings in the recent past, he has had some dizziness and symptoms.  He is currently the process of arranging cardiology appointment here at [facility]. Discussed considerations today.  Given current blood pressure in the office, do not feel that any medication changes are needed at this time.  I do feel that follow-up with cardiology is appropriate and encouraged patient to continue with scheduling this.  Cough Since last appointment with me, patient did have recent illness and was diagnosed with coronavirus infection.  He was treated with Paxlovid.  He generally is doing better, however continues to have cough present.  Cough is intermittently productive.  Otherwise, patient has been doing much better.  He has been utilizing cough drops to help with controlling symptoms, otherwise has not used any significant over-the-counter medications as he is concerned about potential interaction with current prescription medications On exam, patient is in no acute distress, intermittent coughing during encounter in office today.   Lungs are clear to auscultation bilaterally Discussed cough with patient, suspect postviral cough syndrome.  Given that lungs were normal on exam today, feel that acute illness such as pneumonia is less likely.  We did discuss possibility of proceeding with chest x-ray, however patient declines today which is reasonable.  Can proceed with use of Tessalon Perles to help with controlling cough, prescription sent to pharmacy on file  Osteoarthritis of right shoulder We previously completed ultrasound-guided injection of glenohumeral joint.  Patient did get some relief with this, however this was short-lived over a period of a couple weeks.  Pain has largely returned to baseline at this time.  Continues to have impact on daily activities, affects his sleep at night We discussed options today, he would be amenable to discussing underlying issue with orthopedic surgeon and reviewing treatment options.  He is somewhat hesitant regarding any surgical intervention at this time.  Will proceed with referral now in order to discuss options and to have patient establish with orthopedic surgeon  Return in about 3 months (around 08/27/2022) for HTN.   ___________________________________________ Jeff Johnson [place], MD, ABFM, CAQSM [facility] [place]

## 2022-05-28 ENCOUNTER — Other Ambulatory Visit (HOSPITAL_BASED_OUTPATIENT_CLINIC_OR_DEPARTMENT_OTHER): Payer: Self-pay

## 2022-06-04 ENCOUNTER — Telehealth: Payer: Self-pay | Admitting: Cardiovascular Disease

## 2022-06-04 ENCOUNTER — Encounter (HOSPITAL_BASED_OUTPATIENT_CLINIC_OR_DEPARTMENT_OTHER): Payer: Self-pay

## 2022-06-04 NOTE — Telephone Encounter (Signed)
This pt wants to switch providers from Dr. Acie Fredrickson to [facility]. It been approved by Dr. Acie Fredrickson. Is it ok with you?  Thanks!

## 2022-06-08 ENCOUNTER — Other Ambulatory Visit (HOSPITAL_BASED_OUTPATIENT_CLINIC_OR_DEPARTMENT_OTHER): Payer: Self-pay | Admitting: Family Medicine

## 2022-06-08 ENCOUNTER — Other Ambulatory Visit (HOSPITAL_BASED_OUTPATIENT_CLINIC_OR_DEPARTMENT_OTHER): Payer: Self-pay

## 2022-06-08 ENCOUNTER — Encounter (HOSPITAL_BASED_OUTPATIENT_CLINIC_OR_DEPARTMENT_OTHER): Payer: Self-pay | Admitting: Pharmacist

## 2022-06-08 DIAGNOSIS — I1A0 Resistant hypertension: Secondary | ICD-10-CM

## 2022-06-08 DIAGNOSIS — I71019 Dissection of thoracic aorta, unspecified: Secondary | ICD-10-CM

## 2022-06-08 MED ORDER — LANSOPRAZOLE 15 MG PO CPDR
15.0000 mg | DELAYED_RELEASE_CAPSULE | Freq: Every day | ORAL | 3 refills | Status: DC
  Filled 2022-06-08: qty 90, 90d supply, fill #0
  Filled 2022-09-20: qty 30, 30d supply, fill #1

## 2022-06-09 ENCOUNTER — Other Ambulatory Visit (HOSPITAL_BASED_OUTPATIENT_CLINIC_OR_DEPARTMENT_OTHER): Payer: Self-pay

## 2022-06-09 DIAGNOSIS — I1A0 Resistant hypertension: Secondary | ICD-10-CM

## 2022-06-09 MED ORDER — HYDRALAZINE HCL 10 MG PO TABS
10.0000 mg | ORAL_TABLET | Freq: Three times a day (TID) | ORAL | 0 refills | Status: DC
  Filled 2022-06-09 – 2022-06-10 (×2): qty 90, 30d supply, fill #0

## 2022-06-09 NOTE — Telephone Encounter (Signed)
Medication has been filled 

## 2022-06-09 NOTE — Telephone Encounter (Signed)
Called patient in regards to his Hydralazine being denied, got the medication called in for him. He is wanting to know if we can refill his Sildenafil, cardiology used to fill it for him but he is waiting for an appointment with them. Will send to PCP to review.

## 2022-06-09 NOTE — Telephone Encounter (Signed)
Pt stopped by  Can you advise why the medication was denied..  Does he need to stop it all together, or we changing it to something else?  Please advise

## 2022-06-10 ENCOUNTER — Other Ambulatory Visit (HOSPITAL_BASED_OUTPATIENT_CLINIC_OR_DEPARTMENT_OTHER): Payer: Self-pay

## 2022-06-14 ENCOUNTER — Other Ambulatory Visit (HOSPITAL_BASED_OUTPATIENT_CLINIC_OR_DEPARTMENT_OTHER): Payer: Self-pay

## 2022-06-14 ENCOUNTER — Other Ambulatory Visit: Payer: Self-pay | Admitting: Cardiovascular Disease

## 2022-06-16 ENCOUNTER — Other Ambulatory Visit (HOSPITAL_BASED_OUTPATIENT_CLINIC_OR_DEPARTMENT_OTHER): Payer: Self-pay

## 2022-06-16 ENCOUNTER — Ambulatory Visit (INDEPENDENT_AMBULATORY_CARE_PROVIDER_SITE_OTHER): Payer: Medicare Other | Admitting: Orthopaedic Surgery

## 2022-06-16 DIAGNOSIS — M75121 Complete rotator cuff tear or rupture of right shoulder, not specified as traumatic: Secondary | ICD-10-CM | POA: Diagnosis not present

## 2022-06-16 NOTE — Progress Notes (Signed)
Chief Complaint: Right shoulder pain     History of Present Illness:    Jeff Johnson is a 77 y.o. male presents today with nearly 2 decades of right shoulder pain which has been worse more recently.  He is having extremely difficult time with overhead activity.  He has not had 2 injections with Dr. [place] with limited relief.  He does have a history of requiring a left shoulder rotator cuff repair twice on the contralateral side.  He is here today for further discussion of the right side.  He is having extremely difficult time using this for overhead activity.  This is his dominant side.  He has pain when sleeping on the side.  He has avoided doing this as result    Surgical History:   none  PMH/PSH/Family History/Social History/Meds/Allergies:    Past Medical History:  Diagnosis Date   Arthritis    Benign essential HTN 05/21/2014   BPH (benign prostatic hyperplasia)    Chronic cystitis    a. With ongoing hematuria.   Contrast media allergy    Diverticulitis    a. 2010: diverticulitis with stricture Sigmoid colectomy with mobilization of the splenic flexure within the end anastomosis.    Dizziness and giddiness    a. H/o dizziness with reportedly negative tilt table. Holter 2012: NSR, sinus tachy, frequent PVCs, multifocal at times in a trigeminal fashion.   Dyspnea on exertion 03/18/2014   Frequency of urination    GERD (gastroesophageal reflux disease)    H/O cardiac catheterization    a. 2011: minor luminal irregularities. b. Nuc 03/2014: ow risk without reversible ischemia or infarction, EF 57%. (There is septal akinesis but otherwise normal wall motion. This is nonspecific.)   Headache    MIGRAINES   Hematuria    History of urinary retention    Hyperlipidemia 03/18/2014   Hyponatremia    a. Remote hx hyponatremia felt 2/2 SSRI.   Nocturia    Obesity    Orthostatic hypotension    Prostate cancer ([place]) 2011   a. s/p radiation.   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 11/2009: pericardial AVR/Bentall.   SYNCOPE 02/18/2010   Qualifier: Diagnosis of  By: Lovena Le, MD, Martyn Malay    Thoracic aortic aneurysm [facility])    a. 11/2009: s/p Magna Ease pericardial tissue valve size 91mm and replacement of fusiform ascending thoracic aneurysm with 30-mm Hemashield cath with hemiarch reconstruction.   Past Surgical History:  Procedure Laterality Date   BACK SURGERY  1985   CARPAL TUNNEL RELEASE     BIL WRISTS   CHOLECYSTECTOMY  2010   COLON SURGERY  2010   colon resection   COLONOSCOPY  2008   [facility]   COLONOSCOPY WITH PROPOFOL N/A 09/02/2017   Procedure: COLONOSCOPY WITH PROPOFOL;  Surgeon: Doran Stabler, MD;  Location: [facility];  Service: Gastroenterology;  Laterality: N/A;   doppler carotid  2012   DOPPLER ECHOCARDIOGRAPHY  2011   POLYPECTOMY  09/02/2017   Procedure: POLYPECTOMY;  Surgeon: Doran Stabler, MD;  Location: [facility];  Service: Gastroenterology;;   PROSTATE SURGERY  2011   adenocarcinoma w/raddiation    ROTATOR CUFF REPAIR     X2  LEFT SHOULDER   THORACIC AORTIC ANEURYSM REPAIR  12/11/09   ASCENDING THORACIC AORTIC ANEURYSM REPAIR   TISSUE AORTIC VALVE REPLACEMENT  12/11/09   TOTAL HIP ARTHROPLASTY Left 04/17/2015   Procedure: LEFT TOTAL HIP ARTHROPLASTY ANTERIOR APPROACH;  Surgeon: Rod Can, MD;  Location: [facility];  Service: Orthopedics;  Laterality: Left;   Social History   Socioeconomic History   Marital status: Married    Spouse name: Not on file   Number of children: Not on file   Years of education: Not on file   Highest education level: Not on file  Occupational History   Not on file  Tobacco Use   Smoking status: Former    Types: Cigarettes    Quit date: 03/16/1967    Years since quitting: 55.2   Smokeless tobacco: Never  Vaping Use   Vaping Use: Never used  Substance and Sexual Activity   Alcohol use: No   Drug  use: No   Sexual activity: Not on file  Other Topics Concern   Not on file  Social History Narrative   Not on file   Social Determinants of Health   Financial Resource Strain: Not on file  Food Insecurity: Not on file  Transportation Needs: Not on file  Physical Activity: Not on file  Stress: Not on file  Social Connections: Not on file   Family History  Problem Relation Age of Onset   Stroke Mother 58   Heart disease Father 63   Heart attack Father    Colon cancer Maternal Aunt    Stomach cancer Neg Hx    Esophageal cancer Neg Hx    Allergies  Allergen Reactions   Beta Adrenergic Blockers Other (See Comments)    Remote reportedh/o intolerance due to weakness per notes.Marland KitchenMarland KitchenMarland KitchenCalled patient. Advised patient of provider's approval for requested procedure, as well as any comments/instructions from provider.    Iodinated Contrast Media Anaphylaxis   Shellfish Allergy Anaphylaxis and Other (See Comments)   Shellfish-Derived Products Anaphylaxis   Current Outpatient Medications  Medication Sig Dispense Refill   acetaminophen (TYLENOL) 650 MG CR tablet Take 1,300 mg by mouth daily.      ASPIRIN 81 PO Take 1 tablet by mouth daily.     Cholecalciferol (VITAMIN D PO) Take 1 tablet by mouth daily.     hydrALAZINE (APRESOLINE) 10 MG tablet Take 1 tablet (10 mg total) by mouth 3 (three) times daily. 90 tablet 0   influenza vaccine adjuvanted (FLUAD) 0.5 ML injection Inject into the muscle. 0.5 mL 0   lansoprazole (PREVACID) 15 MG capsule Take 1 capsule (15 mg total) by mouth daily at 12 noon. 30 capsule 3   losartan (COZAAR) 50 MG tablet Take 1 tablet (50 mg total) by mouth daily. 30 tablet 2   metoprolol tartrate (LOPRESSOR) 25 MG tablet Take 0.5 tablets (12.5 mg total) by mouth 2 (two) times daily. 90 tablet 1   montelukast (SINGULAIR) 10 MG tablet Take 1 tablet (10 mg total) by mouth daily. 30 tablet 3   RSV vaccine recomb adjuvanted (AREXVY) 120 MCG/0.5ML injection Inject into the  muscle. 0.5 mL 0   simvastatin (ZOCOR) 20 MG tablet Take 1 tablet (20 mg total) by mouth daily. 30 tablet 3   tamsulosin (FLOMAX) 0.4 MG CAPS capsule Take 1 capsule (0.4 mg total) by mouth daily. 30 capsule 6   No current facility-administered medications for this visit.   No results found.  Review of Systems:   A ROS was performed including  pertinent positives and negatives as documented in the HPI.  Physical Exam :   Constitutional: NAD and appears stated age Neurological: Alert and oriented Psych: Appropriate affect and cooperative There were no vitals taken for this visit.   Comprehensive Musculoskeletal Exam:    Musculoskeletal Exam    Inspection Right Left  Skin No atrophy or winging No atrophy or winging  Palpation    Tenderness Lateral deltoid none  Range of Motion    Flexion (passive) 150 170  Flexion (active) 100 170  Abduction 70 170  ER at the side 40 70  Can reach behind back to L5 T12  Strength     4-5 supraspinatus 5/5  Special Tests    Pseudoparalytic No No  Neurologic    Fires PIN, radial, median, ulnar, musculocutaneous, axillary, suprascapular, long thoracic, and spinal accessory innervated muscles. No abnormal sensibility  Vascular/Lymphatic    Radial Pulse 2+ 2+  Cervical Exam    Patient has symmetric cervical range of motion with negative Spurling's test.  Special Test: Positive Neer impingement and drop arm     Imaging:   Xray (3 views right shoulder): Mild glenohumeral osteoarthritis with mild elevation of the humeral head    I personally reviewed and interpreted the radiographs.   Assessment:   77 y.o. male right-hand-dominant male with concern for right shoulder rotator cuff tear.  I discussed that he has failed now to injections.  Given the fact that he has very limited overhead motion and function I do believe you have a of benefit from an MRI in order to diagnose and assess this.  Will plan to obtain this and I will see him back  following discuss results  Plan :    -Plan for right shoulder MRI and follow-up to discuss results     I personally saw and evaluated the patient, and participated in the management and treatment plan.  Vanetta Mulders, MD Attending Physician, Orthopedic Surgery  This document was dictated using Dragon voice recognition software. A reasonable attempt at proof reading has been made to minimize errors.

## 2022-06-22 ENCOUNTER — Other Ambulatory Visit (HOSPITAL_BASED_OUTPATIENT_CLINIC_OR_DEPARTMENT_OTHER): Payer: Self-pay

## 2022-06-23 ENCOUNTER — Other Ambulatory Visit (HOSPITAL_BASED_OUTPATIENT_CLINIC_OR_DEPARTMENT_OTHER): Payer: Self-pay

## 2022-06-23 DIAGNOSIS — I1A0 Resistant hypertension: Secondary | ICD-10-CM

## 2022-06-23 MED ORDER — HYDRALAZINE HCL 10 MG PO TABS
10.0000 mg | ORAL_TABLET | Freq: Three times a day (TID) | ORAL | 0 refills | Status: DC
  Filled 2022-06-23 – 2022-07-16 (×4): qty 90, 30d supply, fill #0

## 2022-06-26 ENCOUNTER — Ambulatory Visit
Admission: RE | Admit: 2022-06-26 | Discharge: 2022-06-26 | Disposition: A | Discharge status: Home / Self Care | Payer: Medicare Other | Source: Ambulatory Visit | Attending: Orthopaedic Surgery | Admitting: Orthopaedic Surgery

## 2022-06-26 DIAGNOSIS — M948X1 Other specified disorders of cartilage, shoulder: Secondary | ICD-10-CM | POA: Diagnosis not present

## 2022-06-26 DIAGNOSIS — M19011 Primary osteoarthritis, right shoulder: Secondary | ICD-10-CM | POA: Diagnosis not present

## 2022-06-26 DIAGNOSIS — M8448XA Pathological fracture, other site, initial encounter for fracture: Secondary | ICD-10-CM | POA: Diagnosis not present

## 2022-06-26 DIAGNOSIS — M75121 Complete rotator cuff tear or rupture of right shoulder, not specified as traumatic: Secondary | ICD-10-CM | POA: Diagnosis not present

## 2022-07-08 ENCOUNTER — Ambulatory Visit (HOSPITAL_BASED_OUTPATIENT_CLINIC_OR_DEPARTMENT_OTHER): Payer: Medicare Other | Admitting: Family Medicine

## 2022-07-14 ENCOUNTER — Ambulatory Visit (HOSPITAL_BASED_OUTPATIENT_CLINIC_OR_DEPARTMENT_OTHER): Payer: Medicare Other | Admitting: Family Medicine

## 2022-07-16 ENCOUNTER — Ambulatory Visit (HOSPITAL_BASED_OUTPATIENT_CLINIC_OR_DEPARTMENT_OTHER): Payer: Medicare Other | Admitting: Orthopaedic Surgery

## 2022-07-16 ENCOUNTER — Other Ambulatory Visit (HOSPITAL_BASED_OUTPATIENT_CLINIC_OR_DEPARTMENT_OTHER): Payer: Self-pay | Admitting: Family Medicine

## 2022-07-16 ENCOUNTER — Other Ambulatory Visit (HOSPITAL_BASED_OUTPATIENT_CLINIC_OR_DEPARTMENT_OTHER): Payer: Self-pay

## 2022-07-16 DIAGNOSIS — M75121 Complete rotator cuff tear or rupture of right shoulder, not specified as traumatic: Secondary | ICD-10-CM

## 2022-07-16 DIAGNOSIS — I71019 Dissection of thoracic aorta, unspecified: Secondary | ICD-10-CM

## 2022-07-16 MED ORDER — MONTELUKAST SODIUM 10 MG PO TABS
10.0000 mg | ORAL_TABLET | Freq: Every day | ORAL | 3 refills | Status: DC
Start: 2022-07-16 — End: 2022-11-17
  Filled 2022-07-16: qty 90, 90d supply, fill #0
  Filled 2022-10-20: qty 30, 30d supply, fill #1

## 2022-07-16 MED ORDER — SIMVASTATIN 20 MG PO TABS
20.0000 mg | ORAL_TABLET | Freq: Every day | ORAL | 3 refills | Status: DC
  Filled 2022-07-16: qty 90, 90d supply, fill #0
  Filled 2022-10-07 – 2022-10-08 (×2): qty 30, 30d supply, fill #1

## 2022-07-16 NOTE — Progress Notes (Signed)
Chief Complaint: Right shoulder pain     History of Present Illness:   07/16/2022: Today for follow-up of his MRI.  Overall he does feel somewhat better and states that the shoulder is manageable.  Jeff Johnson is a 77 y.o. male presents today with nearly 2 decades of right shoulder pain which has been worse more recently.  He is having extremely difficult time with overhead activity.  He has not had 2 injections with Dr. [place] with limited relief.  He does have a history of requiring a left shoulder rotator cuff repair twice on the contralateral side.  He is here today for further discussion of the right side.  He is having extremely difficult time using this for overhead activity.  This is his dominant side.  He has pain when sleeping on the side.  He has avoided doing this as result    Surgical History:   none  PMH/PSH/Family History/Social History/Meds/Allergies:    Past Medical History:  Diagnosis Date   Arthritis    Benign essential HTN 05/21/2014   BPH (benign prostatic hyperplasia)    Chronic cystitis    a. With ongoing hematuria.   Contrast media allergy    Diverticulitis    a. 2010: diverticulitis with stricture Sigmoid colectomy with mobilization of the splenic flexure within the end anastomosis.    Dizziness and giddiness    a. H/o dizziness with reportedly negative tilt table. Holter 2012: NSR, sinus tachy, frequent PVCs, multifocal at times in a trigeminal fashion.   Dyspnea on exertion 03/18/2014   Frequency of urination    GERD (gastroesophageal reflux disease)    H/O cardiac catheterization    a. 2011: minor luminal irregularities. b. Nuc 03/2014: ow risk without reversible ischemia or infarction, EF 57%. (There is septal akinesis but otherwise normal wall motion. This is nonspecific.)   Headache    MIGRAINES   Hematuria    History of urinary retention    Hyperlipidemia 03/18/2014   Hyponatremia    a. Remote hx hyponatremia felt  2/2 SSRI.   Nocturia    Obesity    Orthostatic hypotension    Prostate cancer ([facility]) 2011   a. s/p radiation.   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 11/2009: pericardial AVR/Bentall.   SYNCOPE 02/18/2010   Qualifier: Diagnosis of  By: Ladona Ridgel, MD, Jerrell Mylar    Thoracic aortic aneurysm [facility])    a. 11/2009: s/p Magna Ease pericardial tissue valve size 27mm and replacement of fusiform ascending thoracic aneurysm with 30-mm Hemashield cath with hemiarch reconstruction.   Past Surgical History:  Procedure Laterality Date   BACK SURGERY  1985   CARPAL TUNNEL RELEASE     BIL WRISTS   CHOLECYSTECTOMY  2010   COLON SURGERY  2010   colon resection   COLONOSCOPY  2008   [facility]   COLONOSCOPY WITH PROPOFOL N/A 09/02/2017   Procedure: COLONOSCOPY WITH PROPOFOL;  Surgeon: Sherrilyn Rist, MD;  [facility];  Service: Gastroenterology;  Laterality: N/A;   doppler carotid  2012   DOPPLER ECHOCARDIOGRAPHY  2011   POLYPECTOMY  09/02/2017   Procedure: POLYPECTOMY;  Surgeon: Sherrilyn Rist, MD;  [facility];  Service: Gastroenterology;;  PROSTATE SURGERY  2011   adenocarcinoma w/raddiation    ROTATOR CUFF REPAIR     X2 LEFT SHOULDER   THORACIC AORTIC ANEURYSM REPAIR  12/11/09   ASCENDING THORACIC AORTIC ANEURYSM REPAIR   TISSUE AORTIC VALVE REPLACEMENT  12/11/09   TOTAL HIP ARTHROPLASTY Left 04/17/2015   Procedure: LEFT TOTAL HIP ARTHROPLASTY ANTERIOR APPROACH;  Surgeon: Samson Frederic, MD;  [facility];  Service: Orthopedics;  Laterality: Left;   Social History   Socioeconomic History   Marital status: Married    Spouse name: Not on file   Number of children: Not on file   Years of education: Not on file   Highest education level: Not on file  Occupational History   Not on file  Tobacco Use   Smoking status: Former    Types: Cigarettes    Quit date: 03/16/1967    Years since quitting: 55.3    Smokeless tobacco: Never  Vaping Use   Vaping Use: Never used  Substance and Sexual Activity   Alcohol use: No   Drug use: No   Sexual activity: Not on file  Other Topics Concern   Not on file  Social History Narrative   Not on file   Social Determinants of Health   Financial Resource Strain: Not on file  Food Insecurity: Not on file  Transportation Needs: Not on file  Physical Activity: Not on file  Stress: Not on file  Social Connections: Not on file   Family History  Problem Relation Age of Onset   Stroke Mother 60   Heart disease Father 18   Heart attack Father    Colon cancer Maternal Aunt    Stomach cancer Neg Hx    Esophageal cancer Neg Hx    Allergies  Allergen Reactions   Beta Adrenergic Blockers Other (See Comments)    Remote reportedh/o intolerance due to weakness per notes.Marland KitchenMarland KitchenMarland KitchenCalled patient. Advised patient of provider's approval for requested procedure, as well as any comments/instructions from provider.    Iodinated Contrast Media Anaphylaxis   Shellfish Allergy Anaphylaxis and Other (See Comments)   Shellfish-Derived Products Anaphylaxis   Current Outpatient Medications  Medication Sig Dispense Refill   acetaminophen (TYLENOL) 650 MG CR tablet Take 1,300 mg by mouth daily.      ASPIRIN 81 PO Take 1 tablet by mouth daily.     Cholecalciferol (VITAMIN D PO) Take 1 tablet by mouth daily.     hydrALAZINE (APRESOLINE) 10 MG tablet Take 1 tablet (10 mg total) by mouth 3 (three) times daily. 90 tablet 0   influenza vaccine adjuvanted (FLUAD) 0.5 ML injection Inject into the muscle. 0.5 mL 0   lansoprazole (PREVACID) 15 MG capsule Take 1 capsule (15 mg total) by mouth daily at 12 noon. 30 capsule 3   losartan (COZAAR) 50 MG tablet Take 1 tablet (50 mg total) by mouth daily. 30 tablet 2   metoprolol tartrate (LOPRESSOR) 25 MG tablet Take 0.5 tablets (12.5 mg total) by mouth 2 (two) times daily. 90 tablet 1   montelukast (SINGULAIR) 10 MG tablet Take 1 tablet (10  mg total) by mouth daily. 30 tablet 3   RSV vaccine recomb adjuvanted (AREXVY) 120 MCG/0.5ML injection Inject into the muscle. 0.5 mL 0   simvastatin (ZOCOR) 20 MG tablet Take 1 tablet (20 mg total) by mouth daily. 30 tablet 3   tamsulosin (FLOMAX) 0.4 MG CAPS capsule Take 1 capsule (0.4 mg total) by mouth daily. 30 capsule 6   No current facility-administered medications  for this visit.   No results found.  Review of Systems:   A ROS was performed including pertinent positives and negatives as documented in the HPI.  Physical Exam :   Constitutional: NAD and appears stated age Neurological: Alert and oriented Psych: Appropriate affect and cooperative There were no vitals taken for this visit.   Comprehensive Musculoskeletal Exam:    Musculoskeletal Exam    Inspection Right Left  Skin No atrophy or winging No atrophy or winging  Palpation    Tenderness Lateral deltoid none  Range of Motion    Flexion (passive) 150 170  Flexion (active) 100 170  Abduction 70 170  ER at the side 40 70  Can reach behind back to L5 T12  Strength     4-5 supraspinatus 5/5  Special Tests    Pseudoparalytic No No  Neurologic    Fires PIN, radial, median, ulnar, musculocutaneous, axillary, suprascapular, long thoracic, and spinal accessory innervated muscles. No abnormal sensibility  Vascular/Lymphatic    Radial Pulse 2+ 2+  Cervical Exam    Patient has symmetric cervical range of motion with negative Spurling's test.  Special Test: Positive Neer impingement and drop arm     Imaging:   Xray (3 views right shoulder): Mild glenohumeral osteoarthritis with mild elevation of the humeral head  MRI right shoulder: Full-thickness nonrepairable rotator cuff with evidence of rotator cuff arthropathy  I personally reviewed and interpreted the radiographs.   Assessment:   77 y.o. male right-hand-dominant male with concern for right shoulder rotator cuff tear.  Fortunately MRI does show evidence  of rotator cuff arthropathy.  I discussed treatment options with him today.  He does state that the shoulder feels quite manageable in the sense that he does not believe he would be able to be out of work for 2 months after shoulder arthroplasty.  We did discuss the role for injections versus balloon arthroplasty which she will consider.  He will contact us to reach out should he want to consider any of these options Plan :    -He will return to clinic should he want additional steroid injection of the shoulder versus to consider balloon arthroplasty     I personally saw and evaluated the patient, and participated in the management and treatment plan.  Huel Cote, MD Attending Physician, Orthopedic Surgery  This document was dictated using Dragon voice recognition software. A reasonable attempt at proof reading has been made to minimize errors.

## 2022-07-22 ENCOUNTER — Other Ambulatory Visit (HOSPITAL_BASED_OUTPATIENT_CLINIC_OR_DEPARTMENT_OTHER): Payer: Self-pay

## 2022-07-28 ENCOUNTER — Ambulatory Visit (INDEPENDENT_AMBULATORY_CARE_PROVIDER_SITE_OTHER): Payer: Medicare Other | Admitting: Family Medicine

## 2022-07-28 ENCOUNTER — Encounter (HOSPITAL_BASED_OUTPATIENT_CLINIC_OR_DEPARTMENT_OTHER): Payer: Self-pay | Admitting: Family Medicine

## 2022-07-28 ENCOUNTER — Other Ambulatory Visit (HOSPITAL_BASED_OUTPATIENT_CLINIC_OR_DEPARTMENT_OTHER): Payer: Self-pay

## 2022-07-28 DIAGNOSIS — I1A0 Resistant hypertension: Secondary | ICD-10-CM | POA: Diagnosis not present

## 2022-07-28 MED ORDER — HYDRALAZINE HCL 25 MG PO TABS
25.0000 mg | ORAL_TABLET | Freq: Three times a day (TID) | ORAL | 0 refills | Status: DC
Start: 2022-07-28 — End: 2022-08-12
  Filled 2022-07-28: qty 90, 30d supply, fill #0

## 2022-07-28 MED ORDER — LOSARTAN POTASSIUM 50 MG PO TABS
50.0000 mg | ORAL_TABLET | Freq: Every day | ORAL | 1 refills | Status: DC
Start: 2022-07-28 — End: 2023-02-08
  Filled 2022-07-28 – 2022-10-20 (×2): qty 90, 90d supply, fill #0
  Filled 2023-01-19: qty 90, 90d supply, fill #1

## 2022-07-28 NOTE — Progress Notes (Signed)
Established Patient Office Visit  Subjective   Patient ID: CARLE LAQUE, male    DOB: 1945/10/12  Age: 77 y.o. MRN: [id number]  HTN- hydral 10mg  three times daily  Losartan 50mg  once daily   Metop 25mg  twice daily -- not taking this medication. Was told to stop taking this medication but unable to find in review of notes when he was told to stop taking this medication. Plan to discontinue due to bradycardia in office today.    Since having COVID in Feb 2024, reports his blood pressure have increased. He reports he would check his BP more than once during the day if he felt like his blood pressure was high.    Feels short of breath with exertion- when he stands up, bending over to pick something up, getting in/out of the car. Reports this is not new for him but he reports that it has more noticeable.   Appt with cards 5/30  Home BP Readings: 5/4: 168/84 AM 149/75 PM 5/5: 155/78 AM 179/87 PM 5/6: 181/90 AM 160/83 PM 5/7: 169/82 AM 178/81 PM 5/8: 143/80 AM 190/90 PM 5/9: 162/91 AM 111/84 PM        170/86 AM  181/87 PM 5/10: 158/88 AM  156/80 PM           164/79 AM  166/92 PM 5/11: 177/93 AM  167/78 PM 5/12: 176/86 AM 187/97 PM 5/13: 159/92 AM          172/96 AM  192/85 PM 5/14: 188/95 AM 198/91 PM           174/91 AM 196/94 PM 5/15: 168/92 AM   Review of Systems  Constitutional:  Negative for malaise/fatigue.  Respiratory:  Positive for shortness of breath (chronic; not worse than typically). Negative for cough.   Cardiovascular:  Negative for chest pain, palpitations and leg swelling.  Gastrointestinal:  Negative for abdominal pain, nausea and vomiting.  Musculoskeletal:  Negative for myalgias.  Neurological:  Negative for dizziness, speech change, weakness and headaches.      Objective:    BP (!) 174/85   Pulse (!) 53   Ht 6' (1.829 m)   Wt 245 lb (111.1 kg)   SpO2 99%   BMI 33.23 kg/m  BP Readings from Last 3 Encounters:  07/28/22 (!) 174/85  05/27/22 (!)  126/54  05/13/22 (!) 161/75     Physical Exam Constitutional:      Appearance: Normal appearance.  Cardiovascular:     Rate and Rhythm: Normal rate and regular rhythm.     Pulses: Normal pulses.     Heart sounds: Normal heart sounds.  Pulmonary:     Effort: Pulmonary effort is normal.     Breath sounds: Normal breath sounds.  Musculoskeletal:     Right lower leg: No edema.     Left lower leg: No edema.  Neurological:     Mental Status: He is alert.  Psychiatric:        Mood and Affect: Mood normal.        Behavior: Behavior normal.        Thought Content: Thought content normal.        Judgment: Judgment normal.      Assessment & Plan:  1. Resistant hypertension Patient presents for hypertension follow-up. Review of home blood pressure readings, blood pressure remains elevated in almost all recorded readings. Denies chest pain, shortness of breath, lower extremity edema, vision changes, headaches. Patient presents today with elevated blood pressure. Patient  in no acute distress and is well-appearing. Cardiovascular exam with heart regular rate and rhythm. Normal heart sounds, no murmurs present. No lower extremity edema present. Lungs clear to auscultation bilaterally. Patient is currently taking hydralazine 10mg  TID and losartan 50mg  once daily. Reports he was told to stop taking metoprolol 12.5mg  twice daily and has not been taking this since his last visit. Due to pulse of 53 in office today, will not restart metoprolol at this time. Plan to increase hydralazine 25mg  TID while awaiting cardiology input. Prescription sent to pharmacy. Advised patient to closely monitor blood pressure as we change his dose. Return to office sooner if blood pressure begins to be consistently lower than 140/90 or he does not tolerate increase in hydralazine . He reports recently picking up a refill of his hydralazine 10mg  tablets- advised him that he can take 2 tablets for a total of 20mg  three times  daily. Follow-up in 4 weeks.    - hydrALAZINE (APRESOLINE) 25 MG tablet; Take 1 tablet (25 mg total) by mouth 3 (three) times daily.  Dispense: 90 tablet; Refill: 0 - losartan (COZAAR) 50 MG tablet; Take 1 tablet (50 mg total) by mouth daily.  Dispense: 90 tablet; Refill: 1   Return in about 4 weeks (around 08/25/2022) for HTN follow-up.    Alyson Reedy, FNP

## 2022-07-29 ENCOUNTER — Telehealth (HOSPITAL_BASED_OUTPATIENT_CLINIC_OR_DEPARTMENT_OTHER): Payer: Self-pay | Admitting: Family Medicine

## 2022-07-29 ENCOUNTER — Other Ambulatory Visit (HOSPITAL_BASED_OUTPATIENT_CLINIC_OR_DEPARTMENT_OTHER): Payer: Self-pay

## 2022-07-29 MED ORDER — BOOSTRIX 5-2.5-18.5 LF-MCG/0.5 IM SUSY
0.5000 mL | PREFILLED_SYRINGE | INTRAMUSCULAR | 0 refills | Status: DC
  Filled 2022-07-29: qty 0.5, 1d supply, fill #0

## 2022-07-29 NOTE — Telephone Encounter (Signed)
Opened in error

## 2022-07-29 NOTE — Telephone Encounter (Signed)
Contacted [employer] to schedule their annual wellness visit. Appointment made for 08/10/2022.  Thank you,  Judeth Cornfield,  AMB Clinical Support [facility] AWV Program Direct Dial ??[phone]

## 2022-08-05 ENCOUNTER — Other Ambulatory Visit (HOSPITAL_BASED_OUTPATIENT_CLINIC_OR_DEPARTMENT_OTHER): Payer: Self-pay | Admitting: Family Medicine

## 2022-08-10 ENCOUNTER — Ambulatory Visit (INDEPENDENT_AMBULATORY_CARE_PROVIDER_SITE_OTHER): Payer: Medicare Other

## 2022-08-10 ENCOUNTER — Telehealth (HOSPITAL_BASED_OUTPATIENT_CLINIC_OR_DEPARTMENT_OTHER): Payer: Self-pay

## 2022-08-10 ENCOUNTER — Encounter (HOSPITAL_BASED_OUTPATIENT_CLINIC_OR_DEPARTMENT_OTHER): Payer: Self-pay

## 2022-08-10 VITALS — BP 150/70 | Ht 72.0 in | Wt 229.0 lb

## 2022-08-10 DIAGNOSIS — Z1159 Encounter for screening for other viral diseases: Secondary | ICD-10-CM

## 2022-08-10 DIAGNOSIS — Z0000 Encounter for general adult medical examination without abnormal findings: Secondary | ICD-10-CM | POA: Diagnosis not present

## 2022-08-10 NOTE — Patient Instructions (Signed)
Jeff Johnson , Thank you for taking time to come for your Medicare Wellness Visit. I appreciate your ongoing commitment to your health goals. Please review the following plan we discussed and let me know if I can assist you in the future.   These are the goals we discussed:  Goals      Patient Stated     Patient stated his goal is to get his blood pressure under control. He has an appt to establish care with cardiology on 08/12/22        This is a list of the screening recommended for you and due dates:  Health Maintenance  Topic Date Due   Hepatitis C Screening  Never done   Zoster (Shingles) Vaccine (1 of 2) Never done   Pneumonia Vaccine (1 of 1 - PCV) Never done   COVID-19 Vaccine (3 - Pfizer risk series) 05/13/2019   Flu Shot  10/14/2022   Medicare Annual Wellness Visit  08/10/2023   DTaP/Tdap/Td vaccine (2 - Td or Tdap) 07/28/2032   HPV Vaccine  Aged Out   Colon Cancer Screening  Discontinued    Advanced directives: Advance directive discussed with you today. Even though you declined this today, please call our office should you change your mind, and we can give you the proper paperwork for you to fill out. Advance care planning is a way to make decisions about medical care that fits your values in case you are ever unable to make these decisions for yourself.   Conditions/risks identified: Aim for 30 minutes of exercise or brisk walking, 6-8 glasses of water, and 5 servings of fruits and vegetables each day.   Next appointment: Follow up in one year for your annual wellness visit. August 16, 2023 at 2:30pm via telephone  Preventive Care 79 Years and Older, Male  Preventive care refers to lifestyle choices and visits with your health care provider that can promote health and wellness. What does preventive care include? A yearly physical exam. This is also called an annual well check. Dental exams once or twice a year. Routine eye exams. Ask your health care provider how often  you should have your eyes checked. Personal lifestyle choices, including: Daily care of your teeth and gums. Regular physical activity. Eating a healthy diet. Avoiding tobacco and drug use. Limiting alcohol use. Practicing safe sex. Taking low doses of aspirin every day. Taking vitamin and mineral supplements as recommended by your health care provider. What happens during an annual well check? The services and screenings done by your health care provider during your annual well check will depend on your age, overall health, lifestyle risk factors, and family history of disease. Counseling  Your health care provider may ask you questions about your: Alcohol use. Tobacco use. Drug use. Emotional well-being. Home and relationship well-being. Sexual activity. Eating habits. History of falls. Memory and ability to understand (cognition). Work and work Astronomer. Screening  You may have the following tests or measurements: Height, weight, and BMI. Blood pressure. Lipid and cholesterol levels. These may be checked every 5 years, or more frequently if you are over 65 years old. Skin check. Lung cancer screening. You may have this screening every year starting at age 79 if you have a 30-pack-year history of smoking and currently smoke or have quit within the past 15 years. Fecal occult blood test (FOBT) of the stool. You may have this test every year starting at age 79. Flexible sigmoidoscopy or colonoscopy. You may have a sigmoidoscopy  every 5 years or a colonoscopy every 10 years starting at age 79. Prostate cancer screening. Recommendations will vary depending on your family history and other risks. Hepatitis C blood test. Hepatitis B blood test. Sexually transmitted disease (STD) testing. Diabetes screening. This is done by checking your blood sugar (glucose) after you have not eaten for a while (fasting). You may have this done every 1-3 years. Abdominal aortic aneurysm (AAA)  screening. You may need this if you are a current or former smoker. Osteoporosis. You may be screened starting at age 79 if you are at high risk. Talk with your health care provider about your test results, treatment options, and if necessary, the need for more tests. Vaccines  Your health care provider may recommend certain vaccines, such as: Influenza vaccine. This is recommended every year. Tetanus, diphtheria, and acellular pertussis (Tdap, Td) vaccine. You may need a Td booster every 10 years. Zoster vaccine. You may need this after age 79. Pneumococcal 13-valent conjugate (PCV13) vaccine. One dose is recommended after age 3. Pneumococcal polysaccharide (PPSV23) vaccine. One dose is recommended after age 35. Talk to your health care provider about which screenings and vaccines you need and how often you need them. This information is not intended to replace advice given to you by your health care provider. Make sure you discuss any questions you have with your health care provider. Document Released: 03/28/2015 Document Revised: 11/19/2015 Document Reviewed: 12/31/2014 Elsevier Interactive Patient Education  2017 [employer].  Fall Prevention in the Home Falls can cause injuries. They can happen to people of all ages. There are many things you can do to make your home safe and to help prevent falls. What can I do on the outside of my home? Regularly fix the edges of walkways and driveways and fix any cracks. Remove anything that might make you trip as you walk through a door, such as a raised step or threshold. Trim any bushes or trees on the path to your home. Use bright outdoor lighting. Clear any walking paths of anything that might make someone trip, such as rocks or tools. Regularly check to see if handrails are loose or broken. Make sure that both sides of any steps have handrails. Any raised decks and porches should have guardrails on the edges. Have any leaves, snow, or ice  cleared regularly. Use sand or salt on walking paths during winter. Clean up any spills in your garage right away. This includes oil or grease spills. What can I do in the bathroom? Use night lights. Install grab bars by the toilet and in the tub and shower. Do not use towel bars as grab bars. Use non-skid mats or decals in the tub or shower. If you need to sit down in the shower, use a plastic, non-slip stool. Keep the floor dry. Clean up any water that spills on the floor as soon as it happens. Remove soap buildup in the tub or shower regularly. Attach bath mats securely with double-sided non-slip rug tape. Do not have throw rugs and other things on the floor that can make you trip. What can I do in the bedroom? Use night lights. Make sure that you have a light by your bed that is easy to reach. Do not use any sheets or blankets that are too big for your bed. They should not hang down onto the floor. Have a firm chair that has side arms. You can use this for support while you get dressed. Do not have  throw rugs and other things on the floor that can make you trip. What can I do in the kitchen? Clean up any spills right away. Avoid walking on wet floors. Keep items that you use a lot in easy-to-reach places. If you need to reach something above you, use a strong step stool that has a grab bar. Keep electrical cords out of the way. Do not use floor polish or wax that makes floors slippery. If you must use wax, use non-skid floor wax. Do not have throw rugs and other things on the floor that can make you trip. What can I do with my stairs? Do not leave any items on the stairs. Make sure that there are handrails on both sides of the stairs and use them. Fix handrails that are broken or loose. Make sure that handrails are as long as the stairways. Check any carpeting to make sure that it is firmly attached to the stairs. Fix any carpet that is loose or worn. Avoid having throw rugs at the  top or bottom of the stairs. If you do have throw rugs, attach them to the floor with carpet tape. Make sure that you have a light switch at the top of the stairs and the bottom of the stairs. If you do not have them, ask someone to add them for you. What else can I do to help prevent falls? Wear shoes that: Do not have high heels. Have rubber bottoms. Are comfortable and fit you well. Are closed at the toe. Do not wear sandals. If you use a stepladder: Make sure that it is fully opened. Do not climb a closed stepladder. Make sure that both sides of the stepladder are locked into place. Ask someone to hold it for you, if possible. Clearly mark and make sure that you can see: Any grab bars or handrails. First and last steps. Where the edge of each step is. Use tools that help you move around (mobility aids) if they are needed. These include: Canes. Walkers. Scooters. Crutches. Turn on the lights when you go into a dark area. Replace any light bulbs as soon as they burn out. Set up your furniture so you have a clear path. Avoid moving your furniture around. If any of your floors are uneven, fix them. If there are any pets around you, be aware of where they are. Review your medicines with your doctor. Some medicines can make you feel dizzy. This can increase your chance of falling. Ask your doctor what other things that you can do to help prevent falls. This information is not intended to replace advice given to you by your health care provider. Make sure you discuss any questions you have with your health care provider. Document Released: 12/26/2008 Document Revised: 08/07/2015 Document Reviewed: 04/05/2014 Elsevier Interactive Patient Education  2017 [employer].

## 2022-08-10 NOTE — Telephone Encounter (Signed)
Patient has appts with Dr. De Peru on 6/17 and 6/18. He requests these appts be combined.   Please request vaccine documentation from [facility] on [address]. Patient states he had vaccines done there.   Abby Yoltzin Ransom, CMA  [facility] AW[facility] Team Direct Dial: [phone]

## 2022-08-10 NOTE — Progress Notes (Signed)
I connected with  Luci Bank on 08/10/22 by a audio enabled telemedicine application and verified that I am speaking with the correct person using two identifiers.  Patient Location: Home  Provider Location: Home Office  I discussed the limitations of evaluation and management by telemedicine. The patient expressed understanding and agreed to proceed.  Subjective:   Jeff Johnson is a 76 y.o. male who presents for Medicare Annual/Subsequent preventive examination.  Review of Systems     Cardiac Risk Factors include: advanced age (>51men, >8 women);dyslipidemia;hypertension;male gender;obesity (BMI >30kg/m2);sedentary lifestyle     Objective:    Today's Vitals   08/10/22 1527 08/10/22 1529  BP: (!) 150/70   Weight: 229 lb (103.9 kg)   Height: 6' (1.829 m)   PainSc: 4  4   PainLoc: Shoulder    Body mass index is 31.06 kg/m.     08/10/2022    3:38 PM 05/02/2022    8:37 AM 04/17/2022    9:46 PM 03/16/2021    7:31 AM 05/07/2019    5:15 PM 01/14/2018    9:44 PM 09/02/2017    9:21 AM  Advanced Directives  Does Patient Have a Medical Advance Directive? No No No No No No No  Would patient like information on creating a medical advance directive? No - Patient declined No - Patient declined No - Patient declined No - Patient declined  No - Patient declined No - Patient declined    Current Medications (verified) Outpatient Encounter Medications as of 08/10/2022  Medication Sig   acetaminophen (TYLENOL) 650 MG CR tablet Take 1,300 mg by mouth daily.    ASPIRIN 81 PO Take 1 tablet by mouth daily.   Cholecalciferol (VITAMIN D PO) Take 1 tablet by mouth daily.   hydrALAZINE (APRESOLINE) 25 MG tablet Take 1 tablet (25 mg total) by mouth 3 (three) times daily.   lansoprazole (PREVACID) 15 MG capsule Take 1 capsule (15 mg total) by mouth daily at 12 noon.   losartan (COZAAR) 50 MG tablet Take 1 tablet (50 mg total) by mouth daily.   montelukast (SINGULAIR) 10 MG tablet Take 1 tablet  (10 mg total) by mouth daily.   simvastatin (ZOCOR) 20 MG tablet Take 1 tablet (20 mg total) by mouth daily.   tamsulosin (FLOMAX) 0.4 MG CAPS capsule Take 1 capsule (0.4 mg total) by mouth daily.   Tdap (BOOSTRIX) 5-2.5-18.5 LF-MCG/0.5 injection Inject 0.5 mLs into the muscle.   No facility-administered encounter medications on file as of 08/10/2022.    Allergies (verified) Beta adrenergic blockers, Iodinated contrast media, Shellfish allergy, and Shellfish-derived products   History: Past Medical History:  Diagnosis Date   Arthritis    Benign essential HTN 05/21/2014   BPH (benign prostatic hyperplasia)    Chronic cystitis    a. With ongoing hematuria.   Contrast media allergy    Diverticulitis    a. 2010: diverticulitis with stricture Sigmoid colectomy with mobilization of the splenic flexure within the end anastomosis.    Dizziness and giddiness    a. H/o dizziness with reportedly negative tilt table. Holter 2012: NSR, sinus tachy, frequent PVCs, multifocal at times in a trigeminal fashion.   Dyspnea on exertion 03/18/2014   Frequency of urination    GERD (gastroesophageal reflux disease)    H/O cardiac catheterization    a. 2011: minor luminal irregularities. b. Nuc 03/2014: ow risk without reversible ischemia or infarction, EF 57%. (There is septal akinesis but otherwise normal wall motion. This is nonspecific.)  Headache    MIGRAINES   Hematuria    History of urinary retention    Hyperlipidemia 03/18/2014   Hyponatremia    a. Remote hx hyponatremia felt 2/2 SSRI.   Nocturia    Obesity    Orthostatic hypotension    Prostate cancer (HCC) 2011   a. s/p radiation.   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 11/2009: pericardial AVR/[place].   SYNCOPE 02/18/2010   Qualifier: Diagnosis of  By: Ladona Ridgel, MD, Jerrell Mylar    Thoracic aortic aneurysm [facility])    a. 11/2009: s/p Magna Ease pericardial tissue valve size 27mm  and replacement of fusiform ascending thoracic aneurysm with 30-mm Hemashield cath with hemiarch reconstruction.   Past Surgical History:  Procedure Laterality Date   BACK SURGERY  1985   CARPAL TUNNEL RELEASE     BIL WRISTS   CHOLECYSTECTOMY  2010   COLON SURGERY  2010   colon resection   COLONOSCOPY  2008   [facility]   COLONOSCOPY WITH PROPOFOL N/A 09/02/2017   Procedure: COLONOSCOPY WITH PROPOFOL;  Surgeon: Sherrilyn Rist, MD;  Location: [facility];  Service: Gastroenterology;  Laterality: N/A;   doppler carotid  2012   DOPPLER ECHOCARDIOGRAPHY  2011   POLYPECTOMY  09/02/2017   Procedure: POLYPECTOMY;  Surgeon: Sherrilyn Rist, MD;  Location: [facility];  Service: Gastroenterology;;   PROSTATE SURGERY  2011   adenocarcinoma w/raddiation    ROTATOR CUFF REPAIR     X2 LEFT SHOULDER   THORACIC AORTIC ANEURYSM REPAIR  12/11/09   ASCENDING THORACIC AORTIC ANEURYSM REPAIR   TISSUE AORTIC VALVE REPLACEMENT  12/11/09   TOTAL HIP ARTHROPLASTY Left 04/17/2015   Procedure: LEFT TOTAL HIP ARTHROPLASTY ANTERIOR APPROACH;  Surgeon: Samson Frederic, MD;  Location: [facility];  Service: Orthopedics;  Laterality: Left;   Family History  Problem Relation Age of Onset   Stroke Mother 10   Heart disease Father 2   Heart attack Father    Colon cancer Maternal Aunt    Stomach cancer Neg Hx    Esophageal cancer Neg Hx    Social History   Socioeconomic History   Marital status: Married    Spouse name: Not on file   Number of children: Not on file   Years of education: Not on file   Highest education level: Not on file  Occupational History   Not on file  Tobacco Use   Smoking status: Former    Types: Cigarettes    Quit date: 03/16/1967    Years since quitting: 55.4   Smokeless tobacco: Never  Vaping Use   Vaping Use: Never used  Substance and Sexual Activity   Alcohol use: No   Drug use: No   Sexual activity: Not on file  Other Topics Concern   Not on file  Social History Narrative    Not on file   Social Determinants of Health   Financial Resource Strain: Low Risk  (08/10/2022)   Overall Financial Resource Strain (CARDIA)    Difficulty of Paying Living Expenses: Not hard at all  Food Insecurity: No Food Insecurity (08/10/2022)   Hunger Vital Sign    Worried About Running Out of Food in the Last Year: Never true    Ran Out of Food in the Last Year: Never true  Transportation Needs: No Transportation Needs (08/10/2022)   PRAPARE - Transportation    Lack  of Transportation (Medical): No    Lack of Transportation (Non-Medical): No  Physical Activity: Inactive (08/10/2022)   Exercise Vital Sign    Days of Exercise per Week: 0 days    Minutes of Exercise per Session: 0 min  Stress: No Stress Concern Present (08/10/2022)   [employer] of Occupational Health - Occupational Stress Questionnaire    Feeling of Stress : Not at all  Social Connections: Socially Integrated (08/10/2022)   Social Connection and Isolation Panel [NHANES]    Frequency of Communication with Friends and Family: More than three times a week    Frequency of Social Gatherings with Friends and Family: More than three times a week    Attends Religious Services: More than 4 times per year    Active Member of [employer] or Organizations: Yes    Attends Engineer, structural: More than 4 times per year    Marital Status: Married    Tobacco Counseling Counseling given: Yes   Clinical Intake:  Pre-visit preparation completed: Yes  Pain : 0-10 Pain Score: 4  Pain Type: Chronic pain Pain Location: Shoulder Pain Orientation: Right Pain Descriptors / Indicators: Constant Pain Onset: More than a month ago Pain Frequency: Constant     BMI - recorded: 31.06 Nutritional Status: BMI > 30  Obese Nutritional Risks: None Diabetes: No  How often do you need to have someone help you when you read instructions, pamphlets, or other written materials from your doctor or pharmacy?: 1 -  Never  Diabetic?no  Interpreter Needed?: No  Information entered by :: Abby Lamarius Dirr, CMA   Activities of Daily Living    08/10/2022    3:35 PM 05/27/2022    8:49 AM  In your present state of health, do you have any difficulty performing the following activities:  Hearing? 1 0  Comment has hearing aids   Vision? 0 0  Difficulty concentrating or making decisions? 0 0  Walking or climbing stairs? 1 0  Comment cardiac patient   Dressing or bathing? 0 0  Doing errands, shopping? 1 0  Comment blood pressure drops and he loses his breath   Preparing Food and eating ? N   Using the Toilet? N   In the past six months, have you accidently leaked urine? N   Do you have problems with loss of bowel control? N   Managing your Medications? N   Managing your Finances? N   Housekeeping or managing your Housekeeping? N     Patient Care Team: de [place], Buren Kos, MD as PCP - General (Family Medicine) Nahser, Deloris Ping, MD as PCP - Cardiology (Cardiology) Lewis Moccasin, MD (Family Medicine)  Indicate any recent Medical Services you may have received from other than [facility] providers in the past year (date may be approximate).     Assessment:   This is a routine wellness examination for Kriyansh.  Hearing/Vision screen Hearing Screening - Comments:: Patient wears hearing aids due to hearing difficulties.  Patient states he has hearing aids but they make everything too loud.   Vision Screening - Comments:: Wears rx glasses - up to date with routine eye exams with  Dr.Bigby  Dietary issues and exercise activities discussed: Current Exercise Habits: The patient does not participate in regular exercise at present, Exercise limited by: cardiac condition(s);respiratory conditions(s)   Goals Addressed             This Visit's Progress    Patient Stated       Patient  stated his goal is to get his blood pressure under control. He has an appt to establish care with cardiology on 08/12/22        Depression Screen    08/10/2022    3:33 PM 05/27/2022    8:48 AM 05/13/2022    8:54 AM 04/29/2022    1:48 PM 03/29/2022   11:14 AM 03/22/2022    9:07 AM 03/04/2022    3:48 PM  PHQ 2/9 Scores  PHQ - 2 Score 0 0 0 0 0 0 0  PHQ- 9 Score  0 0 0 0 0 0  Exception Documentation  Medical reason Medical reason Medical reason Medical reason Medical reason Medical reason    Fall Risk    08/10/2022    3:30 PM 05/27/2022    8:48 AM 04/29/2022    1:48 PM 03/29/2022   11:14 AM 03/22/2022    9:07 AM  Fall Risk   Falls in the past year? 1 0 0 0 0  Number falls in past yr: 1 0 0 0 0  Injury with Fall? 1 0 0 0 0  Risk for fall due to : History of fall(s);Other (Comment) No Fall Risks No Fall Risks No Fall Risks No Fall Risks  Risk for fall due to: Comment orthostatic hypotension and patient states he "runs out of air"      Follow up Education provided;Falls prevention discussed Falls evaluation completed Falls evaluation completed Falls evaluation completed Falls evaluation completed    FALL RISK PREVENTION PERTAINING TO THE HOME:  Any stairs in or around the home? No  If so, are there any without handrails? No  Home free of loose throw rugs in walkways, pet beds, electrical cords, etc? Yes  Adequate lighting in your home to reduce risk of falls? Yes   ASSISTIVE DEVICES UTILIZED TO PREVENT FALLS:  Life alert? No  Use of a cane, walker or w/c? Yes  Grab bars in the bathroom? Yes  Shower chair or bench in shower? Yes  Elevated toilet seat or a handicapped toilet? Yes   TIMED UP AND GO:  Was the test performed? No . Telephone visit Cognitive Function:        08/10/2022    3:36 PM  6CIT Screen  What Year? 0 points  What month? 0 points  What time? 0 points  Count back from 20 0 points  Months in reverse 0 points  Repeat phrase 0 points  Total Score 0 points    Immunizations Immunization History  Administered Date(s) Administered   Fluad Quad(high Dose 65+) 11/19/2021    Influenza-Unspecified 11/13/2013, 12/14/2014, 01/13/2018   PFIZER(Purple Top)SARS-COV-2 Vaccination 03/26/2019, 04/15/2019   Respiratory Syncytial Virus Vaccine,Recomb Aduvanted(Arexvy) 11/19/2021   Tdap 07/29/2022    TDAP status: Up to date  Flu Vaccine status: Up to date  Pneumococcal vaccine status: Due, Education has been provided regarding the importance of this vaccine. Advised may receive this vaccine at local pharmacy or Health Dept. Aware to provide a copy of the vaccination record if obtained from local pharmacy or Health Dept. Verbalized acceptance and understanding.  Covid-19 vaccine status: Information provided on how to obtain vaccines.   Qualifies for Shingles Vaccine? Yes   Zostavax completed No   Shingrix Completed?: No.    Education has been provided regarding the importance of this vaccine. Patient has been advised to call insurance company to determine out of pocket expense if they have not yet received this vaccine. Advised may also receive vaccine at local pharmacy  or Health Dept. Verbalized acceptance and understanding.  Screening Tests Health Maintenance  Topic Date Due   Hepatitis C Screening  Never done   Zoster Vaccines- Shingrix (1 of 2) Never done   Pneumonia Vaccine 65+ Years old (1 of 1 - PCV) Never done   COVID-19 Vaccine (3 - Pfizer risk series) 05/13/2019   INFLUENZA VACCINE  10/14/2022   Medicare Annual Wellness (AWV)  08/10/2023   DTaP/Tdap/Td (2 - Td or Tdap) 07/28/2032   HPV VACCINES  Aged Out   Colonoscopy  Discontinued    Health Maintenance  Health Maintenance Due  Topic Date Due   Hepatitis C Screening  Never done   Zoster Vaccines- Shingrix (1 of 2) Never done   Pneumonia Vaccine 35+ Years old (1 of 1 - PCV) Never done   COVID-19 Vaccine (3 - Pfizer risk series) 05/13/2019    Colorectal cancer screening: No longer required.   Lung Cancer Screening: (Low Dose CT Chest recommended if Age 74-80 years, 30 pack-year currently smoking OR  have quit w/in 15years.) does not qualify.   Additional Screening:  Hepatitis C Screening: does qualify; Ordered 08/10/2022  Vision Screening: Recommended annual ophthalmology exams for early detection of glaucoma and other disorders of the eye. Is the patient up to date with their annual eye exam?  Yes  Who is the provider or what is the name of the office in which the patient attends annual eye exams? Dr. Jeanie Sewer If pt is not established with a provider, would they like to be referred to a provider to establish care? No .   Dental Screening: Recommended annual dental exams for proper oral hygiene  Community Resource Referral / Chronic Care Management: CRR required this visit?  No   CCM required this visit?  No      Plan:     I have personally reviewed and noted the following in the patient's chart:   Medical and social history Use of alcohol, tobacco or illicit drugs  Current medications and supplements including opioid prescriptions. Patient is not currently taking opioid prescriptions. Functional ability and status Nutritional status Physical activity Advanced directives List of other physicians Hospitalizations, surgeries, and ER visits in previous 12 months Vitals Screenings to include cognitive, depression, and falls Referrals and appointments  In addition, I have reviewed and discussed with patient certain preventive protocols, quality metrics, and best practice recommendations. A written personalized care plan for preventive services as well as general preventive health recommendations were provided to patient.   Due to this being a telephonic visit, the after visit summary with patients personalized plan was offered to patient via mail or my-chart. Patient would like to access their AVS via my-chart    Jordan Hawks Alvetta Hidrogo, CMA   08/10/2022   Nurse Notes: Patient states he had vaccines at [facility] on [address].

## 2022-08-12 ENCOUNTER — Encounter (HOSPITAL_BASED_OUTPATIENT_CLINIC_OR_DEPARTMENT_OTHER): Payer: Self-pay | Admitting: Cardiology

## 2022-08-12 ENCOUNTER — Other Ambulatory Visit (HOSPITAL_BASED_OUTPATIENT_CLINIC_OR_DEPARTMENT_OTHER): Payer: Self-pay

## 2022-08-12 ENCOUNTER — Ambulatory Visit (HOSPITAL_BASED_OUTPATIENT_CLINIC_OR_DEPARTMENT_OTHER): Payer: Medicare Other | Admitting: Cardiology

## 2022-08-12 VITALS — BP 130/80 | HR 56 | Ht 72.0 in | Wt 241.6 lb

## 2022-08-12 DIAGNOSIS — R0609 Other forms of dyspnea: Secondary | ICD-10-CM

## 2022-08-12 DIAGNOSIS — Z9889 Other specified postprocedural states: Secondary | ICD-10-CM | POA: Diagnosis not present

## 2022-08-12 DIAGNOSIS — Z952 Presence of prosthetic heart valve: Secondary | ICD-10-CM | POA: Diagnosis not present

## 2022-08-12 DIAGNOSIS — I10 Essential (primary) hypertension: Secondary | ICD-10-CM | POA: Diagnosis not present

## 2022-08-12 DIAGNOSIS — E782 Mixed hyperlipidemia: Secondary | ICD-10-CM

## 2022-08-12 DIAGNOSIS — I1A0 Resistant hypertension: Secondary | ICD-10-CM

## 2022-08-12 MED ORDER — AMLODIPINE BESYLATE 5 MG PO TABS
5.0000 mg | ORAL_TABLET | Freq: Every day | ORAL | 3 refills | Status: DC
Start: 2022-08-12 — End: 2022-08-30
  Filled 2022-08-12: qty 90, 90d supply, fill #0

## 2022-08-12 MED ORDER — HYDRALAZINE HCL 25 MG PO TABS: 25.0000 mg | ORAL_TABLET | Freq: Three times a day (TID) | ORAL | 0 refills | Status: DC | PRN

## 2022-08-12 NOTE — Patient Instructions (Addendum)
Medication Instructions: TAKE Hydralazine to as needed for top number greater than 160 (if you aren't due for any other medication) START Amlodipine 5 mg daily  *If you need a refill on your cardiac medications before your next appointment, please call your pharmacy*  Lab Work: NONE ordered at this time of appointment   If you have labs (blood work) drawn today and your tests are completely normal, you will receive your results only by: MyChart Message (if you have MyChart) OR A paper copy in the mail If you have any lab test that is abnormal or we need to change your treatment, we will call you to review the results.  Testing/Procedures: NONE ordered at this time of appointment   Follow-Up: At [facility], you and your health needs are our priority.  As part of our continuing mission to provide you with exceptional heart care, we have created designated Provider Care Teams.  These Care Teams include your primary Cardiologist (physician) and Advanced Practice Providers (APPs -  Physician Assistants and Nurse Practitioners) who all work together to provide you with the care you need, when you need it.  Your next appointment:   2 month(s)  Provider:   Jodelle Red, MD    Other Instructions  -send me a trend of your blood pressures over mychart in a few weeks (2-3 weeks). If it's not where we want, we will increase the amlodipine to 10 mg daily.

## 2022-08-12 NOTE — Progress Notes (Signed)
Cardiology Office Note:    Date:  08/12/2022   ID:  Jaylan, Servatius 1945-12-24, MRN [id number]  PCP:  [place], Buren Kos, MD  Cardiologist:  Jodelle Red, MD  Referring MD: Novella Olive, FNP   History of Present Illness:   Jeff Johnson is a 77 y.o. male with a hx of hypertension, hyperlipidemia, aortic valve disease, s/p Bentall procedure who is seen as a new consult at the request of Novella Olive, FNP for the evaluation and management of resistant hypertension. He was previously an established patient with Dr. Elease Hashimoto.   Today, he complains of shortness of breath with minimal exertion. He notes that he has difficulty breathing when getting in and out of car, getting in bed, and when bending over.   He reports his blood pressure has been elevated since getting Covid. His blood pressure was previously in the 110/60s for many years. After getting Covid is blood pressure has been uncontrolled and reports a reading as high as 212/100s. He monitors his blood pressure at home and states this morning it was 157/90 after taking his antihypertensives. He is compliant with 50 mg losartan daily. His blood pressure has slightly improved since his losartan was increased from 25 mg. He states his blood pressure tends to be higher in the morning and at nighttime.   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   Past Medical History:  Diagnosis Date   Arthritis    Benign essential HTN 05/21/2014   BPH (benign prostatic hyperplasia)    Chronic cystitis    a. With ongoing hematuria.   Contrast media allergy    Diverticulitis    a. 2010: diverticulitis with stricture Sigmoid colectomy with mobilization of the splenic flexure within the end anastomosis.    Dizziness and giddiness    a. H/o dizziness with reportedly negative tilt table. Holter 2012: NSR, sinus tachy, frequent PVCs, multifocal at times in a trigeminal fashion.   Dyspnea on  exertion 03/18/2014   Frequency of urination    GERD (gastroesophageal reflux disease)    H/O cardiac catheterization    a. 2011: minor luminal irregularities. b. Nuc 03/2014: ow risk without reversible ischemia or infarction, EF 57%. (There is septal akinesis but otherwise normal wall motion. This is nonspecific.)   Headache    MIGRAINES   Hematuria    History of urinary retention    Hyperlipidemia 03/18/2014   Hyponatremia    a. Remote hx hyponatremia felt 2/2 SSRI.   Nocturia    Obesity    Orthostatic hypotension    Prostate cancer (HCC) 2011   a. s/p radiation.   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 11/2009: pericardial AVR/Bentall.   SYNCOPE 02/18/2010   Qualifier: Diagnosis of  By: Ladona Ridgel, MD, Jerrell Mylar    Thoracic aortic aneurysm [facility])    a. 11/2009: s/p Magna Ease pericardial tissue valve size 27mm and replacement of fusiform ascending thoracic aneurysm with 30-mm Hemashield cath with hemiarch reconstruction.    Past Surgical History:  Procedure Laterality Date   BACK SURGERY  1985   CARPAL TUNNEL RELEASE     BIL WRISTS   CHOLECYSTECTOMY  2010   COLON SURGERY  2010   colon resection   COLONOSCOPY  2008   [facility]   COLONOSCOPY WITH PROPOFOL N/A 09/02/2017  Procedure: COLONOSCOPY WITH PROPOFOL;  Surgeon: Sherrilyn Rist, MD;  Location: [facility];  Service: Gastroenterology;  Laterality: N/A;   doppler carotid  2012   DOPPLER ECHOCARDIOGRAPHY  2011   POLYPECTOMY  09/02/2017   Procedure: POLYPECTOMY;  Surgeon: Sherrilyn Rist, MD;  Location: [facility];  Service: Gastroenterology;;   PROSTATE SURGERY  2011   adenocarcinoma w/raddiation    ROTATOR CUFF REPAIR     X2 LEFT SHOULDER   THORACIC AORTIC ANEURYSM REPAIR  12/11/09   ASCENDING THORACIC AORTIC ANEURYSM REPAIR   TISSUE AORTIC VALVE REPLACEMENT  12/11/09   TOTAL HIP ARTHROPLASTY Left 04/17/2015   Procedure: LEFT TOTAL HIP ARTHROPLASTY ANTERIOR  APPROACH;  Surgeon: Samson Frederic, MD;  Location: [facility];  Service: Orthopedics;  Laterality: Left;    Current Medications: Current Outpatient Medications on File Prior to Visit  Medication Sig   acetaminophen (TYLENOL) 650 MG CR tablet Take 1,300 mg by mouth daily. Per patient taking (6) six times a day   ASPIRIN 81 PO Take 1 tablet by mouth daily.   Cholecalciferol (VITAMIN D PO) Take 1 tablet by mouth daily.   lansoprazole (PREVACID) 15 MG capsule Take 1 capsule (15 mg total) by mouth daily at 12 noon.   losartan (COZAAR) 50 MG tablet Take 1 tablet (50 mg total) by mouth daily.   montelukast (SINGULAIR) 10 MG tablet Take 1 tablet (10 mg total) by mouth daily.   simvastatin (ZOCOR) 20 MG tablet Take 1 tablet (20 mg total) by mouth daily.   tamsulosin (FLOMAX) 0.4 MG CAPS capsule Take 1 capsule (0.4 mg total) by mouth daily.   Tdap (BOOSTRIX) 5-2.5-18.5 LF-MCG/0.5 injection Inject 0.5 mLs into the muscle.   No current facility-administered medications on file prior to visit.     Allergies:   Beta adrenergic blockers, Iodinated contrast media, Shellfish allergy, and Shellfish-derived products   Social History   Tobacco Use   Smoking status: Former    Types: Cigarettes    Quit date: 03/16/1967    Years since quitting: 55.4   Smokeless tobacco: Never  Vaping Use   Vaping Use: Never used  Substance Use Topics   Alcohol use: No   Drug use: No    Family History: family history includes Colon cancer in his maternal aunt; Heart attack in his father; Heart disease (age of onset: 17) in his father; Stroke (age of onset: 65) in his mother. There is no history of Stomach cancer or Esophageal cancer.  ROS:   Please see the history of present illness.  Additional pertinent ROS: Constitutional: Negative for chills, fever, night sweats, unintentional weight loss  HENT: Negative for ear pain and hearing loss.   Eyes: Negative for loss of vision and eye pain.  Respiratory: Negative for  cough, sputum, wheezing.   Cardiovascular: See HPI. Gastrointestinal: Negative for abdominal pain, melena, and hematochezia.  Genitourinary: Negative for dysuria and hematuria.  Musculoskeletal: Negative for falls and myalgias.  Skin: Negative for itching and rash.  Neurological: Negative for focal weakness, focal sensory changes and loss of consciousness.  Endo/Heme/Allergies: Does not bruise/bleed easily.     EKGs/Labs/Other Studies Reviewed:    The following studies were reviewed today:  Echo 02/13/2021:  IMPRESSIONS   1. Left ventricular ejection fraction, by estimation, is 55 to 60%. The  left ventricle has normal function. The left ventricle has no regional  wall motion abnormalities. There is mild left ventricular hypertrophy.  Left ventricular diastolic parameters  are indeterminate.   2.  Right ventricular systolic function is normal. The right ventricular  size is normal. There is normal pulmonary artery systolic pressure. The  estimated right ventricular systolic pressure is 25.3 mmHg.   3. Left atrial size was mildly dilated.   4. The mitral valve is grossly normal. Mild mitral valve regurgitation.   5. The aortic valve has been repaired/replaced. Aortic valve  regurgitation is not visualized. There is a 27 mm Magna-ease pericardial  valve present in the aortic position. Procedure Date: 12/11/2009. Echo  findings are consistent with normal structure and   function of the aortic valve prosthesis.   6. Aortic root/ascending aorta has been repaired/replaced and with 30 mm  Hemashield straight graft with hemi-arch reconstruction. Normal appearance  of ascending aorta replacement.   EKG:  EKG is personally reviewed.   EKG was not ordered.  Recent Labs: 02/09/2022: ALT 22 05/02/2022: B Natriuretic Peptide 184.2; BUN 18; Creatinine, Ser 0.94; Hemoglobin 13.3; Platelets 219; Potassium 3.9; Sodium 138  Recent Lipid Panel    Component Value Date/Time   CHOL 158 10/21/2021  0855   TRIG 209 (H) 10/21/2021 0855   HDL 41 10/21/2021 0855   CHOLHDL 3.9 10/21/2021 0855   CHOLHDL 4.2 02/17/2015 1223   VLDL 33 (H) 02/17/2015 1223   LDLCALC 82 10/21/2021 0855    Physical Exam:    VS:  BP 130/80   Pulse (!) 56   Ht 6' (1.829 m)   Wt 241 lb 9.6 oz (109.6 kg)   SpO2 98%   BMI 32.77 kg/m     Wt Readings from Last 3 Encounters:  08/12/22 241 lb 9.6 oz (109.6 kg)  08/10/22 229 lb (103.9 kg)  07/28/22 245 lb (111.1 kg)    GEN: Well nourished, well developed in no acute distress HEENT: Normal, moist mucous membranes NECK: No JVD CARDIAC: regular rhythm, normal S1 and S2, no rubs or gallops. No murmur. VASCULAR: Radial and DP pulses 2+ bilaterally. No carotid bruits RESPIRATORY:  Clear to auscultation without rales, wheezing or rhonchi  ABDOMEN: Soft, non-tender, non-distended MUSCULOSKELETAL:  Ambulates independently SKIN: Warm and dry, no edema NEUROLOGIC:  Alert and oriented x 3. No focal neuro deficits noted. PSYCHIATRIC:  Normal affect    ASSESSMENT:    1. Dyspnea on exertion   2. S/P AVR (aortic valve replacement)   3. Hx of repair of aortic root   4. Essential hypertension   5. Mixed hyperlipidemia   6. Resistant hypertension    PLAN:    Hypertension -with lability; ok today but can have very high numbers -Keep losartan as is -Change hydralazine to as needed for top number greater than 160 (if you aren't due for any other medication) -we will start amlodipine 5 mg daily. Can take morning or night.  -send a trend of your blood pressures over mychart in a few weeks (2-3 weeks). If it's not where we want, we will increase the amlodipine to 10 mg daily.   Dyspnea on exertion S/P AVR with aortic root repair -continue to monitor symptoms -continue aspirin -needs antibiotic prophylaxis prior to dental procedures  Mixed hyperlipidemia -continue simvastatin  Cardiac risk counseling and prevention recommendations: -recommend heart  healthy/Mediterranean diet, with whole grains, fruits, vegetable, fish, lean meats, nuts, and olive oil. Limit salt. -recommend moderate walking, 3-5 times/week for 30-50 minutes each session. Aim for at least 150 minutes.week. Goal should be pace of 3 miles/hours, or walking 1.5 miles in 30 minutes -recommend avoidance of tobacco products. Avoid excess alcohol. -ASCVD risk score:  The 10-year ASCVD risk score (Arnett DK, et al., 2019) is: 36.2%   Values used to calculate the score:     Age: 68 years     Sex: Male     Is Non-Hispanic African American: No     Diabetic: No     Tobacco smoker: No     Systolic Blood Pressure: 130 mmHg     Is BP treated: Yes     HDL Cholesterol: 41 mg/dL     Total Cholesterol: 158 mg/dL    Plan for follow up: 2 months or sooner if needed  Jodelle Red, MD, PhD, [facility]  [facility]  [place]  [facility] & V[facility] at [facility] [address], [address] [place], [place] [address] [phone]   Medication Adjustments/Labs and Tests Ordered: Current medicines are reviewed at length with the patient today.  Concerns regarding medicines are outlined above.  No orders of the defined types were placed in this encounter.  Meds ordered this encounter  Medications   hydrALAZINE (APRESOLINE) 25 MG tablet    Sig: Take 1 tablet (25 mg total) by mouth 3 (three) times daily as needed (blood pressure more than 160 on the top number).    Dispense:  90 tablet    Refill:  0   amLODipine (NORVASC) 5 MG tablet    Sig: Take 1 tablet (5 mg total) by mouth daily.    Dispense:  90 tablet    Refill:  3   Patient Instructions  We are going to make some changes: -Keep losartan as is -Change hydralazine to as needed for top number greater than 160 (if you aren't due for any other medication) -we will start amlodipine 5 mg daily. Can take morning or night.  -send me a trend of your blood pressures over mychart in a  few weeks (2-3 weeks). If it's not where we want, we will increase the amlodipine to 10 mg daily.   I,Rachel Rivera,acting as a Neurosurgeon for [employer], MD.,have documented all relevant documentation on the behalf of Jodelle Red, MD,as directed by  Jodelle Red, MD while in the presence of Jodelle Red, MD.  I, Jodelle Red, MD, have reviewed all documentation for this visit. The documentation on 09/17/22 for the exam, diagnosis, procedures, and orders are all accurate and complete.   Signed, Jodelle Red, MD PhD 08/12/2022     [facility]

## 2022-08-25 ENCOUNTER — Other Ambulatory Visit (HOSPITAL_BASED_OUTPATIENT_CLINIC_OR_DEPARTMENT_OTHER): Payer: Self-pay | Admitting: Family Medicine

## 2022-08-25 ENCOUNTER — Other Ambulatory Visit (HOSPITAL_BASED_OUTPATIENT_CLINIC_OR_DEPARTMENT_OTHER): Payer: Self-pay

## 2022-08-25 DIAGNOSIS — I71019 Dissection of thoracic aorta, unspecified: Secondary | ICD-10-CM

## 2022-08-25 MED ORDER — TAMSULOSIN HCL 0.4 MG PO CAPS
0.4000 mg | ORAL_CAPSULE | Freq: Every day | ORAL | 6 refills | Status: DC
Start: 2022-08-25 — End: 2023-03-31
  Filled 2022-08-25: qty 90, 90d supply, fill #0
  Filled 2022-10-20: qty 90, 90d supply, fill #1
  Filled 2022-11-16: qty 30, 30d supply, fill #1
  Filled 2022-12-20: qty 30, 30d supply, fill #2
  Filled 2023-01-17: qty 30, 30d supply, fill #3
  Filled 2023-02-19: qty 30, 30d supply, fill #4

## 2022-08-30 ENCOUNTER — Ambulatory Visit (INDEPENDENT_AMBULATORY_CARE_PROVIDER_SITE_OTHER): Payer: Medicare Other | Admitting: Family Medicine

## 2022-08-30 ENCOUNTER — Other Ambulatory Visit (HOSPITAL_BASED_OUTPATIENT_CLINIC_OR_DEPARTMENT_OTHER): Payer: Self-pay

## 2022-08-30 ENCOUNTER — Encounter (HOSPITAL_BASED_OUTPATIENT_CLINIC_OR_DEPARTMENT_OTHER): Payer: Self-pay | Admitting: Family Medicine

## 2022-08-30 VITALS — BP 154/61 | HR 50 | Ht 72.0 in | Wt 242.0 lb

## 2022-08-30 DIAGNOSIS — I1A0 Resistant hypertension: Secondary | ICD-10-CM

## 2022-08-30 MED ORDER — AMLODIPINE BESYLATE 10 MG PO TABS
10.0000 mg | ORAL_TABLET | Freq: Every day | ORAL | 1 refills | Status: DC
Start: 2022-08-30 — End: 2023-02-25
  Filled 2022-08-30: qty 90, 90d supply, fill #0
  Filled 2022-11-27 (×2): qty 90, 90d supply, fill #1

## 2022-08-30 NOTE — Progress Notes (Signed)
    Procedures performed today:    None.  Independent interpretation of notes and tests performed by another provider:   None.  Brief History, Exam, Impression, and Recommendations:    BP (!) 154/61 (BP Location: Left Arm, Patient Position: Sitting, Cuff Size: Normal)   Pulse (!) 50   Ht 6' (1.829 m)   Wt 242 lb (109.8 kg)   SpO2 99%   BMI 32.82 kg/m   Resistant hypertension Assessment & Plan: Patient recently met with cardiology.  Had some adjustment medications with addition of amlodipine and changed to administration of hydralazine.  He has been checking his blood pressures at home and brings log into the office today, blood pressures continue to be above goal at home.  Blood pressure in office today is above goal for systolic, at goal for diastolic.  No issues with chest pain, headache, lightheadedness or dizziness. We discussed options today, we will proceed with slight adjustment in dose of amlodipine which had been considered by cardiology should pressures remain elevated over the subsequent 2 to 3 weeks.  We will increase from 5 mg to 10 mg for the amlodipine.  Cautioned on potential side effects and adverse reactions that could occur with this dose change. Recommend intermittent monitoring of blood pressure at home, DASH diet Will plan for follow-up in about 3 months, he does have follow-up with cardiology in about 6 weeks  Orders: -     amLODIPine Besylate; Take 1 tablet (10 mg total) by mouth daily.  Dispense: 90 tablet; Refill: 1  Return in about 3 months (around 11/30/2022) for hypertension.   ___________________________________________ Daijah Scrivens de [place], MD, ABFM, CAQSM Primary Care and Sports Medicine [facility]

## 2022-08-30 NOTE — Patient Instructions (Signed)
  Medication Instructions:  Your physician recommends that you continue on your current medications as directed. Please refer to the Current Medication list given to you today. --If you need a refill on any your medications before your next appointment, please call your pharmacy first. If no refills are authorized on file call the office.-- Lab Work: Your physician has recommended that you have lab work today: No If you have labs (blood work) drawn today and your tests are completely normal, you will receive your results via MyChart message OR a phone call from our staff.  Please ensure you check your voicemail in the event that you authorized detailed messages to be left on a delegated number. If you have any lab test that is abnormal or we need to change your treatment, we will call you to review the results.  Referrals/Procedures/Imaging: No  Follow-Up: Your next appointment:   Your physician recommends that you schedule a follow-up appointment in: 3 months with Dr. de [place].  You will receive a text message or e-mail with a link to a survey about your care and experience with us today! We would greatly appreciate your feedback!   Thanks for letting us be apart of your health journey!!  Primary Care and Sports Medicine   Dr. Raymond de Cuba   We encourage you to activate your patient portal called "MyChart".  Sign up information is provided on this After Visit Summary.  MyChart is used to connect with patients for Virtual Visits (Telemedicine).  Patients are able to view lab/test results, encounter notes, upcoming appointments, etc.  Non-urgent messages can be sent to your provider as well. To learn more about what you can do with MyChart, please visit --  [email or web address].    

## 2022-08-30 NOTE — Assessment & Plan Note (Signed)
Patient recently met with cardiology.  Had some adjustment medications with addition of amlodipine and changed to administration of hydralazine.  He has been checking his blood pressures at home and brings log into the office today, blood pressures continue to be above goal at home.  Blood pressure in office today is above goal for systolic, at goal for diastolic.  No issues with chest pain, headache, lightheadedness or dizziness. We discussed options today, we will proceed with slight adjustment in dose of amlodipine which had been considered by cardiology should pressures remain elevated over the subsequent 2 to 3 weeks.  We will increase from 5 mg to 10 mg for the amlodipine.  Cautioned on potential side effects and adverse reactions that could occur with this dose change. Recommend intermittent monitoring of blood pressure at home, DASH diet Will plan for follow-up in about 3 months, he does have follow-up with cardiology in about 6 weeks

## 2022-08-31 ENCOUNTER — Ambulatory Visit (HOSPITAL_BASED_OUTPATIENT_CLINIC_OR_DEPARTMENT_OTHER): Payer: Medicare Other | Admitting: Family Medicine

## 2022-09-17 ENCOUNTER — Encounter (HOSPITAL_BASED_OUTPATIENT_CLINIC_OR_DEPARTMENT_OTHER): Payer: Self-pay | Admitting: Cardiology

## 2022-09-20 ENCOUNTER — Other Ambulatory Visit (HOSPITAL_BASED_OUTPATIENT_CLINIC_OR_DEPARTMENT_OTHER): Payer: Self-pay

## 2022-09-27 ENCOUNTER — Other Ambulatory Visit (HOSPITAL_BASED_OUTPATIENT_CLINIC_OR_DEPARTMENT_OTHER): Payer: Self-pay

## 2022-09-27 ENCOUNTER — Other Ambulatory Visit (HOSPITAL_BASED_OUTPATIENT_CLINIC_OR_DEPARTMENT_OTHER): Payer: Self-pay | Admitting: Family Medicine

## 2022-10-03 ENCOUNTER — Other Ambulatory Visit (HOSPITAL_BASED_OUTPATIENT_CLINIC_OR_DEPARTMENT_OTHER): Payer: Self-pay

## 2022-10-04 ENCOUNTER — Encounter (HOSPITAL_BASED_OUTPATIENT_CLINIC_OR_DEPARTMENT_OTHER): Payer: Self-pay | Admitting: Family Medicine

## 2022-10-05 ENCOUNTER — Ambulatory Visit (HOSPITAL_BASED_OUTPATIENT_CLINIC_OR_DEPARTMENT_OTHER): Payer: Medicare Other

## 2022-10-05 NOTE — Progress Notes (Signed)
Pt was here for bp re-check. Pt state he does follow-up with cardiology so they can monitor it as well.

## 2022-10-07 ENCOUNTER — Other Ambulatory Visit (HOSPITAL_BASED_OUTPATIENT_CLINIC_OR_DEPARTMENT_OTHER): Payer: Self-pay | Admitting: Family Medicine

## 2022-10-07 ENCOUNTER — Other Ambulatory Visit (HOSPITAL_BASED_OUTPATIENT_CLINIC_OR_DEPARTMENT_OTHER): Payer: Self-pay

## 2022-10-07 DIAGNOSIS — I71019 Dissection of thoracic aorta, unspecified: Secondary | ICD-10-CM

## 2022-10-08 ENCOUNTER — Other Ambulatory Visit (HOSPITAL_BASED_OUTPATIENT_CLINIC_OR_DEPARTMENT_OTHER): Payer: Self-pay

## 2022-10-08 ENCOUNTER — Other Ambulatory Visit: Payer: Self-pay

## 2022-10-08 MED ORDER — LANSOPRAZOLE 15 MG PO CPDR
15.0000 mg | DELAYED_RELEASE_CAPSULE | Freq: Every day | ORAL | 3 refills | Status: DC
Start: 2022-10-08 — End: 2023-02-19
  Filled 2022-10-08 – 2022-10-20 (×3): qty 30, 30d supply, fill #0
  Filled 2022-11-16 (×2): qty 30, 30d supply, fill #1
  Filled 2022-12-20: qty 30, 30d supply, fill #2
  Filled 2023-01-19: qty 30, 30d supply, fill #3

## 2022-10-13 ENCOUNTER — Encounter (HOSPITAL_BASED_OUTPATIENT_CLINIC_OR_DEPARTMENT_OTHER): Payer: Self-pay | Admitting: Cardiology

## 2022-10-13 ENCOUNTER — Ambulatory Visit (HOSPITAL_BASED_OUTPATIENT_CLINIC_OR_DEPARTMENT_OTHER): Payer: Medicare Other | Admitting: Cardiology

## 2022-10-13 DIAGNOSIS — E782 Mixed hyperlipidemia: Secondary | ICD-10-CM

## 2022-10-13 DIAGNOSIS — I10 Essential (primary) hypertension: Secondary | ICD-10-CM

## 2022-10-13 DIAGNOSIS — Z9889 Other specified postprocedural states: Secondary | ICD-10-CM

## 2022-10-13 DIAGNOSIS — Z952 Presence of prosthetic heart valve: Secondary | ICD-10-CM

## 2022-10-13 NOTE — Progress Notes (Signed)
Cardiology Office Note:  .    Date:  10/13/2022  ID:  Jeff Johnson, [name] 1946/02/07, MRN [id number] PCP: de [place], Buren Kos, MD  [facility] Providers Cardiologist:  Jeff Red, MD { Click to update primary MD,subspecialty MD or APP then REFRESH:1}    History of Present Illness: .    Jeff Johnson is a 77 y.o. male with a hx of hypertension, hyperlipidemia, aortic valve disease, s/p Bentall procedure who presents for follow-up today. He was initially seen 08/12/2022 as a new consult at the request of Jeff Olive, FNP for the evaluation and management of resistant hypertension. He was previously an established patient with Dr. Elease Johnson.    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   At his initial visit, he complained of shortness of breath with minimal exertion. His blood pressure was previously in the 110s/60s for many years. After getting Covid, blood pressure was uncontrolled with readings as high as 212/100s. That morning his blood pressure at home was 157/90 after taking his antihypertensives. Compliant with 50 mg losartan daily. We changed hydralazine to as needed for systolic pressures greater than 160 (if not due for any other medication). We also initiated amlodipine 5 mg daily, take morning or night.   His blood pressures remained above goal at home. Per his PCP amlodipine was increased to 10 mg daily 08/30/22.  Today, he reports that he has fallen 3-4 times in the past month, but he has been able to stop himself before falling completely to the floor.  He has been monitoring his home blood pressures which continue to be labile, with highs mostly in the 140s and lows better controlled. Has had diastolic pressures down to 45, asymptomatic. He has needed to take the hydralazine 3 times since his last visit. Typically he can tell when his blood pressure is high as he feels "like a balloon in his head pushing out."  At  times becomes a little bit lightheaded, may occur randomly. Will pull over to the side if this occurs while driving.  He denies any palpitations, chest pain, shortness of breath, peripheral edema, headaches, syncope, or PND.  ROS:  Please see the history of present illness. ROS otherwise negative except as noted.  (+) Mechanical falls (+) Lightheadedness (+) Orthopnea/bendopnea  Studies Reviewed: .         Risk Assessment/Calculations:    {Does this patient have ATRIAL FIBRILLATION?:[phone]}         Physical Exam:    VS:  BP (!) 126/55 (BP Location: Left Arm, Patient Position: Sitting, Cuff Size: Large)   Pulse (!) 59   Ht 6' (1.829 m)   Wt 249 lb 14.4 oz (113.4 kg)   SpO2 99%   BMI 33.89 kg/m    Wt Readings from Last 3 Encounters:  10/13/22 249 lb 14.4 oz (113.4 kg)  08/30/22 242 lb (109.8 kg)  08/12/22 241 lb 9.6 oz (109.6 kg)    GEN: Well nourished, well developed in no acute distress HEENT: Normal, moist mucous membranes NECK: No JVD CARDIAC: regular rhythm, normal S1 and S2, no rubs or gallops. ***/6 murmur. VASCULAR: Radial and DP pulses 2+ bilaterally. No carotid bruits RESPIRATORY:  Clear to auscultation without rales, wheezing or rhonchi  ABDOMEN: Soft, non-tender, non-distended MUSCULOSKELETAL:  Ambulates independently SKIN: Warm and dry, no edema NEUROLOGIC:  Alert and oriented x 3. No focal neuro deficits noted. PSYCHIATRIC:  Normal  affect   ASSESSMENT AND PLAN: .    Hypertension -with lability; ok today but can have very high numbers -Keep losartan as is -Change hydralazine to as needed for top number greater than 160 (if you aren't due for any other medication) -we will start amlodipine 5 mg daily. Can take morning or night.  -send a trend of your blood pressures over mychart in a few weeks (2-3 weeks). If it's not where we want, we will increase the amlodipine to 10 mg daily.    Dyspnea on exertion S/P AVR with aortic root repair -continue to  monitor symptoms -continue aspirin -needs antibiotic prophylaxis prior to dental procedures   Mixed hyperlipidemia -continue simvastatin   Cardiac risk counseling and prevention recommendations: -recommend heart healthy/Mediterranean diet, with whole grains, fruits, vegetable, fish, lean meats, nuts, and Johnson oil. Limit salt. -recommend moderate walking, 3-5 times/week for 30-50 minutes each session. Aim for at least 150 minutes.week. Goal should be pace of 3 miles/hours, or walking 1.5 miles in 30 minutes -recommend avoidance of tobacco products. Avoid excess alcohol. -ASCVD risk score: The 10-year ASCVD risk score (Arnett DK, et al., 2019) is: 36.2%   Values used to calculate the score:     Age: 59 years     Sex: Male     Is Non-Hispanic African American: No     Diabetic: No     Tobacco smoker: No     Systolic Blood Pressure: 130 mmHg     Is BP treated: Yes     HDL Cholesterol: 41 mg/dL     Total Cholesterol: 158 mg/dL       {Are you ordering a CV Procedure (e.g. stress test, cath, DCCV, TEE, etc)?   Press F2        :[id number]}  Dispo: Follow-up in 6 months, or sooner as needed.  I,Jeff Johnson,acting as a Neurosurgeon for [employer], MD.,have documented all relevant documentation on the behalf of Jeff Red, MD,as directed by  Jeff Red, MD while in the presence of Jeff Red, MD.  ***  Signed, Carlena Bjornstad

## 2022-10-13 NOTE — Patient Instructions (Signed)

## 2022-10-14 ENCOUNTER — Encounter (HOSPITAL_BASED_OUTPATIENT_CLINIC_OR_DEPARTMENT_OTHER): Payer: Self-pay | Admitting: Cardiology

## 2022-10-20 ENCOUNTER — Other Ambulatory Visit: Payer: Self-pay

## 2022-10-20 ENCOUNTER — Other Ambulatory Visit (HOSPITAL_BASED_OUTPATIENT_CLINIC_OR_DEPARTMENT_OTHER): Payer: Self-pay

## 2022-11-16 ENCOUNTER — Other Ambulatory Visit (HOSPITAL_BASED_OUTPATIENT_CLINIC_OR_DEPARTMENT_OTHER): Payer: Self-pay

## 2022-11-16 ENCOUNTER — Other Ambulatory Visit (HOSPITAL_BASED_OUTPATIENT_CLINIC_OR_DEPARTMENT_OTHER): Payer: Self-pay | Admitting: Family Medicine

## 2022-11-16 DIAGNOSIS — I71019 Dissection of thoracic aorta, unspecified: Secondary | ICD-10-CM

## 2022-11-17 ENCOUNTER — Other Ambulatory Visit (HOSPITAL_BASED_OUTPATIENT_CLINIC_OR_DEPARTMENT_OTHER): Payer: Self-pay

## 2022-11-17 ENCOUNTER — Other Ambulatory Visit (HOSPITAL_BASED_OUTPATIENT_CLINIC_OR_DEPARTMENT_OTHER): Payer: Self-pay | Admitting: Family Medicine

## 2022-11-17 DIAGNOSIS — I71019 Dissection of thoracic aorta, unspecified: Secondary | ICD-10-CM

## 2022-11-17 MED ORDER — SIMVASTATIN 20 MG PO TABS
20.0000 mg | ORAL_TABLET | Freq: Every day | ORAL | 3 refills | Status: DC
  Filled 2022-11-17: qty 30, 30d supply, fill #0
  Filled 2022-12-20: qty 30, 30d supply, fill #1
  Filled 2023-01-19: qty 30, 30d supply, fill #2
  Filled 2023-02-19: qty 30, 30d supply, fill #3

## 2022-11-17 MED ORDER — MONTELUKAST SODIUM 10 MG PO TABS
10.0000 mg | ORAL_TABLET | Freq: Every day | ORAL | 3 refills | Status: DC
  Filled 2022-11-17: qty 30, 30d supply, fill #0
  Filled 2022-12-20: qty 30, 30d supply, fill #1
  Filled 2023-01-19: qty 30, 30d supply, fill #2
  Filled 2023-02-19: qty 30, 30d supply, fill #3

## 2022-11-27 ENCOUNTER — Other Ambulatory Visit (HOSPITAL_BASED_OUTPATIENT_CLINIC_OR_DEPARTMENT_OTHER): Payer: Self-pay

## 2022-11-30 ENCOUNTER — Ambulatory Visit (INDEPENDENT_AMBULATORY_CARE_PROVIDER_SITE_OTHER): Payer: Medicare Other | Admitting: Family Medicine

## 2022-11-30 ENCOUNTER — Encounter (HOSPITAL_BASED_OUTPATIENT_CLINIC_OR_DEPARTMENT_OTHER): Payer: Self-pay | Admitting: Family Medicine

## 2022-11-30 VITALS — BP 118/46 | HR 49 | Ht 72.0 in | Wt 248.0 lb

## 2022-11-30 DIAGNOSIS — I1A0 Resistant hypertension: Secondary | ICD-10-CM | POA: Diagnosis not present

## 2022-11-30 NOTE — Assessment & Plan Note (Addendum)
Blood pressure has been improved, continues with medications as prescribed. He has been checking his blood pressures at home and brings log into the office today, blood pressures better controlled at home. No issues with chest pain, headache, lightheadedness or dizziness. Still with shortness of breath - this has been consistent, possible gradual progression. On exam, cardiovascular exam with regular rate and rhythm, lungs clear to auscultation bilaterally. Recommend intermittent monitoring of blood pressure at home, DASH diet Will plan for follow-up in about 4-6 months

## 2022-11-30 NOTE — Progress Notes (Signed)
    Procedures performed today:    None.  Independent interpretation of notes and tests performed by another provider:   None.  Brief History, Exam, Impression, and Recommendations:    BP (!) 118/46 (BP Location: Left Arm, Patient Position: Sitting, Cuff Size: Normal)   Pulse 99   Ht 6' (1.829 m)   Wt 248 lb (112.5 kg)   SpO2 (!) 49%   BMI 33.63 kg/m   Resistant hypertension Assessment & Plan: Blood pressure has been improved, continues with medications as prescribed. He has been checking his blood pressures at home and brings log into the office today, blood pressures better controlled at home. No issues with chest pain, headache, lightheadedness or dizziness. Still with shortness of breath - this has been consistent, possible gradual progression. On exam, cardiovascular exam with regular rate and rhythm, lungs clear to auscultation bilaterally. Recommend intermittent monitoring of blood pressure at home, DASH diet Will plan for follow-up in about 4-6 months   Return in about 4 months (around 04/01/2023) for hypertension.   ___________________________________________ Lauriel Helin de [place], MD, ABFM, CAQSM Primary Care and Sports Medicine [facility]

## 2022-12-20 ENCOUNTER — Other Ambulatory Visit (HOSPITAL_BASED_OUTPATIENT_CLINIC_OR_DEPARTMENT_OTHER): Payer: Self-pay

## 2023-01-17 ENCOUNTER — Other Ambulatory Visit (HOSPITAL_BASED_OUTPATIENT_CLINIC_OR_DEPARTMENT_OTHER): Payer: Self-pay

## 2023-01-19 ENCOUNTER — Other Ambulatory Visit (HOSPITAL_BASED_OUTPATIENT_CLINIC_OR_DEPARTMENT_OTHER): Payer: Self-pay

## 2023-02-03 ENCOUNTER — Encounter (HOSPITAL_BASED_OUTPATIENT_CLINIC_OR_DEPARTMENT_OTHER): Payer: Self-pay | Admitting: Family Medicine

## 2023-02-08 ENCOUNTER — Other Ambulatory Visit (HOSPITAL_BASED_OUTPATIENT_CLINIC_OR_DEPARTMENT_OTHER): Payer: Self-pay

## 2023-02-08 ENCOUNTER — Ambulatory Visit (INDEPENDENT_AMBULATORY_CARE_PROVIDER_SITE_OTHER): Payer: Medicare Other | Admitting: Family Medicine

## 2023-02-08 ENCOUNTER — Encounter (HOSPITAL_BASED_OUTPATIENT_CLINIC_OR_DEPARTMENT_OTHER): Payer: Self-pay | Admitting: Family Medicine

## 2023-02-08 VITALS — BP 131/62 | HR 51 | Ht 72.0 in | Wt 253.4 lb

## 2023-02-08 DIAGNOSIS — M25551 Pain in right hip: Secondary | ICD-10-CM | POA: Insufficient documentation

## 2023-02-08 DIAGNOSIS — I1A0 Resistant hypertension: Secondary | ICD-10-CM

## 2023-02-08 MED ORDER — LOSARTAN POTASSIUM 50 MG PO TABS
50.0000 mg | ORAL_TABLET | Freq: Every day | ORAL | 1 refills | Status: DC
  Filled 2023-02-08 – 2023-04-19 (×2): qty 90, 90d supply, fill #0
  Filled 2023-07-15: qty 90, 90d supply, fill #1

## 2023-02-08 MED ORDER — KETOROLAC TROMETHAMINE 30 MG/ML IJ SOLN
30.0000 mg | Freq: Once | INTRAMUSCULAR | Status: AC
Start: 2023-02-08 — End: 2023-02-08
  Administered 2023-02-08: 30 mg via INTRAMUSCULAR

## 2023-02-08 NOTE — Progress Notes (Unsigned)
    Procedures performed today:    None.  Independent interpretation of notes and tests performed by another provider:   None.  Brief History, Exam, Impression, and Recommendations:    BP 131/62 (BP Location: Left Arm, Patient Position: Sitting, Cuff Size: Normal)   Pulse (!) 51   Ht 6' (1.829 m)   Wt 253 lb 6.4 oz (114.9 kg)   SpO2 100%   BMI 34.37 kg/m   There are no diagnoses linked to this encounter.No follow-ups on file.   ___________________________________________ Jeff Vu de [place], MD, ABFM, [facility] Primary Care and Sports Medicine [facility]

## 2023-02-09 NOTE — Assessment & Plan Note (Signed)
Patient presents for evaluation of right hip pain.  He indicates that pain is primarily over lateral/posterior hip, upper buttock area on right side.  He thinks that pain is related to change in gait as he had fractured toe on right foot and was favoring the leg.  Pain is worse with prolonged walking, improved with rest.  Does not necessarily notice increased symptoms when laying on affected side.  Has not had any associated numbness or tingling, no radiation of symptoms.  He wonders whether a shot may be beneficial. On exam, patient is in no acute distress.  Patient ambulating with cane in the office.  Does not have any significant tenderness to palpation over right greater trochanter.  Mild tenderness to palpation in the region of glue medius. Discussed that current symptoms likely related to Feel that current symptoms are primarily originating from gluteus.  Do not suspect greater trochanteric pain syndrome at this time given location of pain and physical exam.  Discussed options, can proceed with low-dose Toradol shot in office today, did discuss potential risks and benefits and potential side effects with patient.  Would also recommend evaluation with physical therapy, patient is amenable, referral placed today.

## 2023-02-09 NOTE — Assessment & Plan Note (Signed)
Blood pressure has been improved, continues with medications as prescribed. He has been checking his blood pressures at home and brings log into the office today, blood pressures better controlled at home - only occasional readings above 140 systolic. No issues with chest pain, headache, lightheadedness or dizziness. Recommend intermittent monitoring of blood pressure at home, DASH diet

## 2023-02-15 ENCOUNTER — Telehealth (HOSPITAL_BASED_OUTPATIENT_CLINIC_OR_DEPARTMENT_OTHER): Payer: Self-pay | Admitting: Cardiology

## 2023-02-15 NOTE — Telephone Encounter (Signed)
Spoke with patient regarding his shortness of breath Per patient has been going on for 3 to 4 weeks When he bends over and straightens up is worse When getting out of the car can walk 5 to 10 feet has to stop and then maybe 20 ft before stopping again  Denies chest pain or swelling   Scheduled patient appointment for 1/13, he is ok date and time  Will forward to Dr Cristal Deer for review, will call back if further recommendations

## 2023-02-15 NOTE — Telephone Encounter (Signed)
Patient is returning your call. Please advise. ?

## 2023-02-15 NOTE — Telephone Encounter (Signed)
  Per MyChart scheduling message:  Initial Complaint: Trouble breathing went standing up from sitting and walking   Pt c/o Shortness Of Breath: STAT if SOB developed within the last 24 hours or pt is noticeably SOB on the phone  1. Are you currently SOB (can you hear that pt is SOB on the phone)?   2. How long have you been experiencing SOB?   3. Are you SOB when sitting or when up moving around?   4. Are you currently experiencing any other symptoms?     Short off breath when standing up starting to walk and when walking very far. This has been a problem for some time but is getting worse lately. Especially when I exit my car. Or rise from sitting

## 2023-02-15 NOTE — Telephone Encounter (Signed)
Left message to call back  

## 2023-02-18 ENCOUNTER — Ambulatory Visit (HOSPITAL_BASED_OUTPATIENT_CLINIC_OR_DEPARTMENT_OTHER): Payer: Self-pay | Admitting: Family Medicine

## 2023-02-18 NOTE — Telephone Encounter (Signed)
Called patient back and he advised that when he got home he took his blood pressure and it was 144/77 and then after waiting a few minutes he took it again and it was 114/65.  Patient is still aware that if anything changes to go to the ER.

## 2023-02-18 NOTE — Telephone Encounter (Signed)
Spoke with patient and he advised that he was working at the moment and he only works 3 hours and he will be home around 4pm.  This RN advised him that we are just following up on his blood pressure.  He advised that he would take it as soon as he gets home and we can call him back around 4:15 to follow up.  He was advised as well that if both numbers are high, we may recommend him going to the ER at that time.  He verbalized understanding of this.

## 2023-02-18 NOTE — Telephone Encounter (Signed)
Copied from CRM [id number]. Topic: Clinical - Red Word Triage >> Feb 18, 2023 11:30 AM Chantha C wrote: Red Word that prompted transfer to Nurse Triage: Pt states BP went up last night 211/184, feeling tired, pressure in head like a band on head, R leg/calf swelling last night. Pt toke meds, but b/p keeps rising.  Chief Complaint: Hypertension Symptoms: "Feeling like a band around his head" Frequency: High blood pressure reading started last night Pertinent Negatives: Patient denies chest pain or a headache or blurry vision or neurological deficits. Disposition: [] ED /[] Urgent Care (no appt availability in office) / [x] Appointment(In office/virtual)/ []  [facility]/ [] Home Care/ [] Refused Recommended Disposition /[] [facility]/ []  Follow-up with PCP Additional Notes: Patient states that he took his blood pressure last night at home, it was 211/184 per patient.  He states he has a medication that he was told he needed to take whenever his blood pressure got over 160. (Per patient).  He stated that the diastolic number got down into the 130s and he said he has talked to his Cardiologist recently about getting in to see him for an assessment due to him feeling short of breath on exertion more often and he said the Cardiologist appt wasn't until January 2025.  This RN asked the patient if he had a way to take his blood pressure right now and he said he did not because him and his wife were out together.  He said he felt better since he took the medication he was advised to take for his blood pressure.  He said he just feels like he has a band around his head and he knows that this happens when he starts having issues with his blood pressure.  He stated that his right calf felt swollen but that had gotten better and gone down.  This RN advised him that if he had swelling in that one calf, there could be concern for a blood clot.  He then said that the calf muscle had gotten a lot better since  he took his medication.  He said he wasn't having any symptoms right now and he just wanted a sooner appointment then January to be checked out again.  Patient's wife is present with him and they are both advised that if anything changes or worsens for the patient to go to the Emergency Room.  Patient verbalized understanding and the patient's wife verbalized that she would be there with him.  Patient stated that he knows to go to the ER if anything gets worse.  This RN advised him if he gets home and his blood pressure is higher than 180/110 or he starts having any symptoms to go to the Emergency Room immediately or call 911.  Patient is also given care advice on lowering sodium, taking blood pressures after resting and take two readings 5 minutes apart.  Patient advised feeling better after taking his blood pressure medication. Will call patient back to follow up on blood pressure readings but appointment was also made for Monday 02/21/23 for 8:50am.   Reason for Disposition  Systolic BP  >= 160 OR Diastolic >= 100  Answer Assessment - Initial Assessment Questions 1. BLOOD PRESSURE: "What is the blood pressure?" "Did you take at least two measurements 5 minutes apart?"     BP taken last night and this morning 2. ONSET: "When did you take your blood pressure?"     Pt states this morning but yesterday morning it was the highest  3. HOW: "How did you take your blood pressure?" (e.g., automatic home BP monitor, visiting nurse)     Automatic at home 4. HISTORY: "Do you have a history of high blood pressure?"     Yes and has medications 5. MEDICINES: "Are you taking any medicines for blood pressure?" "Have you missed any doses recently?"     Yes--has been taking like he is supposed to and has not missed any 6. OTHER SYMPTOMS: "Do you have any symptoms?" (e.g., blurred vision, chest pain, difficulty breathing, headache, weakness)     Shortness of breath with exertion, headache, tired, can't walk very far,  right calf swelling that has gone down now  Protocols used: Blood Pressure - High-A-AH

## 2023-02-19 ENCOUNTER — Other Ambulatory Visit (HOSPITAL_BASED_OUTPATIENT_CLINIC_OR_DEPARTMENT_OTHER): Payer: Self-pay | Admitting: Family Medicine

## 2023-02-19 ENCOUNTER — Other Ambulatory Visit (HOSPITAL_BASED_OUTPATIENT_CLINIC_OR_DEPARTMENT_OTHER): Payer: Self-pay

## 2023-02-19 DIAGNOSIS — I71019 Dissection of thoracic aorta, unspecified: Secondary | ICD-10-CM

## 2023-02-21 ENCOUNTER — Encounter (HOSPITAL_BASED_OUTPATIENT_CLINIC_OR_DEPARTMENT_OTHER): Payer: Self-pay | Admitting: Emergency Medicine

## 2023-02-21 ENCOUNTER — Encounter (HOSPITAL_BASED_OUTPATIENT_CLINIC_OR_DEPARTMENT_OTHER): Payer: Self-pay | Admitting: Family Medicine

## 2023-02-21 ENCOUNTER — Other Ambulatory Visit: Payer: Self-pay

## 2023-02-21 ENCOUNTER — Emergency Department (HOSPITAL_BASED_OUTPATIENT_CLINIC_OR_DEPARTMENT_OTHER): Payer: Medicare Other | Admitting: Radiology

## 2023-02-21 ENCOUNTER — Emergency Department (HOSPITAL_BASED_OUTPATIENT_CLINIC_OR_DEPARTMENT_OTHER): Payer: Medicare Other

## 2023-02-21 ENCOUNTER — Ambulatory Visit (INDEPENDENT_AMBULATORY_CARE_PROVIDER_SITE_OTHER): Payer: Medicare Other | Admitting: Family Medicine

## 2023-02-21 ENCOUNTER — Other Ambulatory Visit (HOSPITAL_BASED_OUTPATIENT_CLINIC_OR_DEPARTMENT_OTHER): Payer: Self-pay

## 2023-02-21 ENCOUNTER — Inpatient Hospital Stay (HOSPITAL_BASED_OUTPATIENT_CLINIC_OR_DEPARTMENT_OTHER)
Admission: EM | Admit: 2023-02-21 | Discharge: 2023-02-25 | DRG: 242 | Disposition: A | Discharge status: Home / Self Care | Payer: Medicare Other | Attending: Internal Medicine | Admitting: Internal Medicine

## 2023-02-21 VITALS — BP 118/82 | HR 57 | Ht 72.0 in | Wt 260.0 lb

## 2023-02-21 DIAGNOSIS — E66811 Obesity, class 1: Secondary | ICD-10-CM | POA: Diagnosis not present

## 2023-02-21 DIAGNOSIS — E669 Obesity, unspecified: Secondary | ICD-10-CM | POA: Diagnosis present

## 2023-02-21 DIAGNOSIS — Z7982 Long term (current) use of aspirin: Secondary | ICD-10-CM

## 2023-02-21 DIAGNOSIS — Z923 Personal history of irradiation: Secondary | ICD-10-CM | POA: Diagnosis not present

## 2023-02-21 DIAGNOSIS — Z79899 Other long term (current) drug therapy: Secondary | ICD-10-CM | POA: Diagnosis not present

## 2023-02-21 DIAGNOSIS — Z96642 Presence of left artificial hip joint: Secondary | ICD-10-CM | POA: Diagnosis not present

## 2023-02-21 DIAGNOSIS — I482 Chronic atrial fibrillation, unspecified: Secondary | ICD-10-CM | POA: Diagnosis not present

## 2023-02-21 DIAGNOSIS — I110 Hypertensive heart disease with heart failure: Secondary | ICD-10-CM | POA: Diagnosis present

## 2023-02-21 DIAGNOSIS — I10 Essential (primary) hypertension: Secondary | ICD-10-CM | POA: Diagnosis not present

## 2023-02-21 DIAGNOSIS — Z91041 Radiographic dye allergy status: Secondary | ICD-10-CM

## 2023-02-21 DIAGNOSIS — I5033 Acute on chronic diastolic (congestive) heart failure: Secondary | ICD-10-CM | POA: Diagnosis not present

## 2023-02-21 DIAGNOSIS — I2510 Atherosclerotic heart disease of native coronary artery without angina pectoris: Secondary | ICD-10-CM | POA: Diagnosis present

## 2023-02-21 DIAGNOSIS — R0602 Shortness of breath: Secondary | ICD-10-CM | POA: Diagnosis present

## 2023-02-21 DIAGNOSIS — E782 Mixed hyperlipidemia: Secondary | ICD-10-CM | POA: Diagnosis not present

## 2023-02-21 DIAGNOSIS — Z8249 Family history of ischemic heart disease and other diseases of the circulatory system: Secondary | ICD-10-CM | POA: Diagnosis not present

## 2023-02-21 DIAGNOSIS — Z8546 Personal history of malignant neoplasm of prostate: Secondary | ICD-10-CM

## 2023-02-21 DIAGNOSIS — I498 Other specified cardiac arrhythmias: Secondary | ICD-10-CM | POA: Diagnosis not present

## 2023-02-21 DIAGNOSIS — N179 Acute kidney failure, unspecified: Secondary | ICD-10-CM | POA: Diagnosis not present

## 2023-02-21 DIAGNOSIS — R0600 Dyspnea, unspecified: Principal | ICD-10-CM

## 2023-02-21 DIAGNOSIS — I441 Atrioventricular block, second degree: Secondary | ICD-10-CM | POA: Diagnosis not present

## 2023-02-21 DIAGNOSIS — Z91013 Allergy to seafood: Secondary | ICD-10-CM

## 2023-02-21 DIAGNOSIS — N400 Enlarged prostate without lower urinary tract symptoms: Secondary | ICD-10-CM | POA: Diagnosis not present

## 2023-02-21 DIAGNOSIS — R001 Bradycardia, unspecified: Secondary | ICD-10-CM | POA: Diagnosis present

## 2023-02-21 DIAGNOSIS — Z9049 Acquired absence of other specified parts of digestive tract: Secondary | ICD-10-CM

## 2023-02-21 DIAGNOSIS — I5031 Acute diastolic (congestive) heart failure: Secondary | ICD-10-CM

## 2023-02-21 DIAGNOSIS — M79604 Pain in right leg: Secondary | ICD-10-CM | POA: Diagnosis not present

## 2023-02-21 DIAGNOSIS — Z6832 Body mass index (BMI) 32.0-32.9, adult: Secondary | ICD-10-CM

## 2023-02-21 DIAGNOSIS — Z800 Family history of malignant neoplasm of digestive organs: Secondary | ICD-10-CM

## 2023-02-21 DIAGNOSIS — K219 Gastro-esophageal reflux disease without esophagitis: Secondary | ICD-10-CM | POA: Diagnosis present

## 2023-02-21 DIAGNOSIS — Z6831 Body mass index (BMI) 31.0-31.9, adult: Secondary | ICD-10-CM | POA: Diagnosis not present

## 2023-02-21 DIAGNOSIS — E785 Hyperlipidemia, unspecified: Secondary | ICD-10-CM | POA: Diagnosis not present

## 2023-02-21 DIAGNOSIS — Z953 Presence of xenogenic heart valve: Secondary | ICD-10-CM | POA: Diagnosis not present

## 2023-02-21 DIAGNOSIS — Z6835 Body mass index (BMI) 35.0-35.9, adult: Secondary | ICD-10-CM | POA: Diagnosis not present

## 2023-02-21 DIAGNOSIS — I1A0 Resistant hypertension: Secondary | ICD-10-CM | POA: Diagnosis not present

## 2023-02-21 DIAGNOSIS — Z87891 Personal history of nicotine dependence: Secondary | ICD-10-CM | POA: Diagnosis not present

## 2023-02-21 DIAGNOSIS — R600 Localized edema: Secondary | ICD-10-CM | POA: Diagnosis not present

## 2023-02-21 DIAGNOSIS — Z950 Presence of cardiac pacemaker: Secondary | ICD-10-CM | POA: Diagnosis not present

## 2023-02-21 DIAGNOSIS — R0609 Other forms of dyspnea: Secondary | ICD-10-CM | POA: Diagnosis not present

## 2023-02-21 LAB — TROPONIN I (HIGH SENSITIVITY)
Troponin I (High Sensitivity): 9 ng/L (ref <18)
Troponin I (High Sensitivity): 8 ng/L (ref <18)

## 2023-02-21 LAB — COMPREHENSIVE METABOLIC PANEL
ALT: 13 U/L (ref 0–44)
AST: 19 U/L (ref 15–41)
Albumin: 4.5 g/dL (ref 3.5–5.0)
Alkaline Phosphatase: 63 U/L (ref 38–126)
Anion gap: 10 (ref 5–15)
BUN: 22 mg/dL (ref 8–23)
CO2: 24 mmol/L (ref 22–32)
Calcium: 9.4 mg/dL (ref 8.9–10.3)
Chloride: 104 mmol/L (ref 98–111)
Creatinine, Ser: 1.18 mg/dL (ref 0.61–1.24)
GFR, Estimated: >60 mL/min (ref >60)
Glucose, Bld: 107 mg/dL — ABNORMAL HIGH (ref 70–99)
Potassium: 4.1 mmol/L (ref 3.5–5.1)
Sodium: 138 mmol/L (ref 135–145)
Total Bilirubin: 2.3 mg/dL — ABNORMAL HIGH (ref <1.2)
Total Protein: 7.5 g/dL (ref 6.5–8.1)

## 2023-02-21 LAB — CBC
HCT: 41.7 % (ref 39.0–52.0)
Hemoglobin: 14.6 g/dL (ref 13.0–17.0)
MCH: 31.3 pg (ref 26.0–34.0)
MCHC: 35 g/dL (ref 30.0–36.0)
MCV: 89.5 fL (ref 80.0–100.0)
Platelets: 170 10*3/uL (ref 150–400)
RBC: 4.66 MIL/uL (ref 4.22–5.81)
RDW: 12.6 % (ref 11.5–15.5)
WBC: 5.9 10*3/uL (ref 4.0–10.5)
nRBC: 0 % (ref 0.0–0.2)

## 2023-02-21 LAB — BRAIN NATRIURETIC PEPTIDE: B Natriuretic Peptide: 122.6 pg/mL — ABNORMAL HIGH (ref 0.0–100.0)

## 2023-02-21 LAB — D-DIMER, QUANTITATIVE: D-Dimer, Quant: 2.44 ug{FEU}/mL — ABNORMAL HIGH (ref 0.00–0.50)

## 2023-02-21 MED ORDER — ACETAMINOPHEN 650 MG RE SUPP: 650.0000 mg | Freq: Four times a day (QID) | RECTAL | Status: DC | PRN

## 2023-02-21 MED ORDER — ENOXAPARIN SODIUM 60 MG/0.6ML IJ SOSY
60.0000 mg | PREFILLED_SYRINGE | INTRAMUSCULAR | Status: DC
  Administered 2023-02-21 – 2023-02-23 (×3): 60 mg via SUBCUTANEOUS
  Filled 2023-02-21 (×3): qty 0.6

## 2023-02-21 MED ORDER — MONTELUKAST SODIUM 10 MG PO TABS
10.0000 mg | ORAL_TABLET | Freq: Every day | ORAL | Status: DC
  Administered 2023-02-21 – 2023-02-24 (×4): 10 mg via ORAL
  Filled 2023-02-21 (×4): qty 1

## 2023-02-21 MED ORDER — ACETAMINOPHEN 325 MG PO TABS
650.0000 mg | ORAL_TABLET | Freq: Four times a day (QID) | ORAL | Status: DC | PRN
  Administered 2023-02-24 – 2023-02-25 (×2): 650 mg via ORAL
  Filled 2023-02-21 (×3): qty 2

## 2023-02-21 MED ORDER — SODIUM CHLORIDE 0.9% FLUSH
3.0000 mL | Freq: Two times a day (BID) | INTRAVENOUS | Status: DC
  Administered 2023-02-21 – 2023-02-25 (×8): 3 mL via INTRAVENOUS

## 2023-02-21 MED ORDER — SENNOSIDES-DOCUSATE SODIUM 8.6-50 MG PO TABS: 1.0000 | ORAL_TABLET | Freq: Every evening | ORAL | Status: DC | PRN

## 2023-02-21 MED ORDER — ASPIRIN 81 MG PO TBEC
81.0000 mg | DELAYED_RELEASE_TABLET | Freq: Every day | ORAL | Status: DC
  Administered 2023-02-22 – 2023-02-24 (×3): 81 mg via ORAL
  Filled 2023-02-21 (×3): qty 1

## 2023-02-21 MED ORDER — SIMVASTATIN 20 MG PO TABS
20.0000 mg | ORAL_TABLET | Freq: Every day | ORAL | Status: DC
  Administered 2023-02-22 – 2023-02-25 (×4): 20 mg via ORAL
  Filled 2023-02-21 (×4): qty 1

## 2023-02-21 MED ORDER — LANSOPRAZOLE 15 MG PO CPDR
15.0000 mg | DELAYED_RELEASE_CAPSULE | Freq: Every day | ORAL | 3 refills | Status: DC
  Filled 2023-02-21 – 2023-02-27 (×2): qty 30, 30d supply, fill #0
  Filled 2023-03-31: qty 90, 90d supply, fill #1

## 2023-02-21 MED ORDER — TAMSULOSIN HCL 0.4 MG PO CAPS
0.4000 mg | ORAL_CAPSULE | Freq: Every day | ORAL | Status: DC
  Administered 2023-02-22 – 2023-02-25 (×4): 0.4 mg via ORAL
  Filled 2023-02-21 (×4): qty 1

## 2023-02-21 MED ORDER — PANTOPRAZOLE SODIUM 20 MG PO TBEC
20.0000 mg | DELAYED_RELEASE_TABLET | Freq: Every day | ORAL | Status: DC
  Administered 2023-02-22 – 2023-02-25 (×4): 20 mg via ORAL
  Filled 2023-02-21 (×4): qty 1

## 2023-02-21 NOTE — ED Triage Notes (Signed)
Pt c/o shob, worse with exertion, BLE swelling, worse on RT side x 2 weeks

## 2023-02-21 NOTE — ED Notes (Signed)
Patient transported to X-ray 

## 2023-02-21 NOTE — Progress Notes (Signed)
    Procedures performed today:    None.  Independent interpretation of notes and tests performed by another provider:   None.  Brief History, Exam, Impression, and Recommendations:    BP 118/82 (BP Location: Left Arm, Patient Position: Sitting, Cuff Size: Normal)   Pulse (!) 57   Ht 6' (1.829 m)   Wt 260 lb (117.9 kg)   SpO2 97%   BMI 35.26 kg/m   Shortness of breath Assessment & Plan: Patient presents for evaluation of shortness of breath.  He indicates that he has been noticing some progressive shortness of breath over the last couple weeks.  He has also had noted right calf pain/swelling for at least 3 to 4 days.  He has noticed with shortness of breath that he has had to stop and take breaks more frequently when walking.  He has not noted any chest pain.  He has typically carry a cane with him in the past, however feels that he has required it more recently to assist with balance.  He did attempt to reach out to his cardiology office, however their soonest appointment was not for at least 4 weeks.  He has had some fluctuating blood pressure readings and had notably elevated blood pressure a few days ago when having right calf pain.  Blood pressure has been better controlled since that time. On exam, patient is in no acute distress, did have to stop multiple times in the hallway when ambulating to exam room.  He does not appear short of breath or have any trouble breathing when sitting and talking during appointment.  Lungs clear to auscultation bilaterally.  Cardiovascular exam with regular rate and rhythm, soft systolic murmur appreciated on auscultation.  Bilateral lower extremities with trace pitting edema, no tenderness to palpation over posterior calf in the either lower extremity. There is not an obvious etiology for patient's progressive shortness of breath over recent weeks.  However, there would be increased concern for possible clot given recent right calf pain, swelling and  ongoing shortness of breath.  I do feel that given his clinical picture further evaluation in the emergency department would be appropriate in order to exclude any emergent underlying conditions given degree of his shortness of breath that he is experiencing.  Patient is amenable to presenting to our emergency department downstairs for further evaluation today.  Offered wheelchair to assist with transportation to our emergency department and patient indicates "I have not given up yet".    ___________________________________________ Rondi Ivy de [place], MD, ABFM, [facility] Primary Care and Sports Medicine [facility]

## 2023-02-21 NOTE — TOC Initial Note (Signed)
Transition of Care [facility]) - Initial/Assessment Note    Patient Details  Name: Jeff Johnson MRN: [id number] Date of Birth: 08-10-1945  [facility] CM/SW Contact:    Lanier Clam, RN Phone Number: 02/21/2023, 4:10 PM  Clinical Narrative: d/c plan home.                  Expected Discharge Plan: Home/Self Care Barriers to Discharge: Continued Medical Work up   Patient Goals and CMS Choice            Expected Discharge Plan and Services                                              Prior Living Arrangements/Services                       Activities of Daily Living   ADL Screening (condition at time of admission) Independently performs ADLs?: Yes (appropriate for developmental age) Is the patient deaf or have difficulty hearing?: No Does the patient have difficulty seeing, even when wearing glasses/contacts?: No Does the patient have difficulty concentrating, remembering, or making decisions?: No  Permission Sought/Granted                  Emotional Assessment              Admission diagnosis:  Dyspnea on exertion [R06.09] Dyspnea, unspecified type [R06.00] Patient Active Problem List   Diagnosis Date Noted   Bradyarrhythmia 02/21/2023   Right hip pain 02/08/2023   Cough 05/27/2022   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   Dysuria 03/04/2022   UTI (urinary tract infection) 02/22/2022   Elevated serum creatinine 02/22/2022   Fatigue 02/09/2022   Urinary frequency 02/09/2022   Medicare annual wellness visit, subsequent 08/07/2021   Bilateral impacted cerumen 11/13/2020   Sensorineural hearing loss (SNHL), 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   Positive colorectal cancer screening using Cologuard test    Benign neoplasm of ascending colon    Benign neoplasm of transverse colon    Pain of left hip joint 04/22/2017   History of revision of total replacement of left hip joint 04/09/2017    History of left hip replacement 04/08/2017   Osteoarthritis 04/06/2017   Arthritis of right hand 02/07/2017   Pain in right hand 02/07/2017   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   Essential hypertension 05/21/2014   Orthostatic hypotension    BPH (benign prostatic hyperplasia)    Diverticulitis    Hyponatremia    H/O cardiac catheterization    Hematuria    Severe aortic stenosis s/p pericardial AVR, [place] in 2011    Contrast media allergy    Obesity    Prostate cancer (HCC)    Chronic cystitis    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   Shortness of breath 03/18/2014   Hyperlipidemia 03/18/2014   GERD (gastroesophageal reflux disease) 03/18/2014   Aneurysm of aorta (HCC) 07/03/2012   History of prostate cancer 07/03/2012   Shoulder pain 05/02/2012   Hematest positive stools 02/03/2012   History of hypertension 05/18/2010   SYNCOPE 02/18/2010   ORTHOSTATIC DIZZINESS 02/18/2010   Allergic rhinitis 02/16/2010   Depression 01/30/2010   History of anemia 01/30/2010   Cardiomegaly 10/15/2009   Vitamin D deficiency 09/03/2009   Hammer toe 12/16/2008   Osteoarthritis 12/03/2008  Hearing loss 06/20/2008   PCP:  de [place], Buren Kos, MD Pharmacy:   [facility] [address] [place] [address] Phone: [phone] Fax: [phone]     Social Determinants of Health (SDOH) Social History: SDOH Screenings   Food Insecurity: No Food Insecurity (02/21/2023)  Housing: Low Risk  (02/21/2023)  Transportation Needs: No Transportation Needs (02/21/2023)  Utilities: Not At Risk (02/21/2023)  Alcohol Screen: Low Risk  (08/10/2022)  Depression (PHQ2-9): Low Risk  (02/21/2023)  Financial Resource Strain: Low Risk  (08/10/2022)  Physical Activity: Inactive (08/10/2022)  Social Connections: Socially Integrated (08/10/2022)  Stress: No Stress Concern Present (08/10/2022)  Tobacco Use: Medium Risk (02/21/2023)  Health  Literacy: Adequate Health Literacy (11/30/2022)   SDOH Interventions:     Readmission Risk Interventions     No data to display

## 2023-02-21 NOTE — Assessment & Plan Note (Signed)
Patient presents for evaluation of shortness of breath.  He indicates that he has been noticing some progressive shortness of breath over the last couple weeks.  He has also had noted right calf pain/swelling for at least 3 to 4 days.  He has noticed with shortness of breath that he has had to stop and take breaks more frequently when walking.  He has not noted any chest pain.  He has typically carry a cane with him in the past, however feels that he has required it more recently to assist with balance.  He did attempt to reach out to his cardiology office, however their soonest appointment was not for at least 4 weeks.  He has had some fluctuating blood pressure readings and had notably elevated blood pressure a few days ago when having right calf pain.  Blood pressure has been better controlled since that time. On exam, patient is in no acute distress, did have to stop multiple times in the hallway when ambulating to exam room.  He does not appear short of breath or have any trouble breathing when sitting and talking during appointment.  Lungs clear to auscultation bilaterally.  Cardiovascular exam with regular rate and rhythm, soft systolic murmur appreciated on auscultation.  Bilateral lower extremities with trace pitting edema, no tenderness to palpation over posterior calf in the either lower extremity. There is not an obvious etiology for patient's progressive shortness of breath over recent weeks.  However, there would be increased concern for possible clot given recent right calf pain, swelling and ongoing shortness of breath.  I do feel that given his clinical picture further evaluation in the emergency department would be appropriate in order to exclude any emergent underlying conditions given degree of his shortness of breath that he is experiencing.  Patient is amenable to presenting to our emergency department downstairs for further evaluation today.  Offered wheelchair to assist with transportation  to our emergency department and patient indicates "I have not given up yet".

## 2023-02-21 NOTE — ED Notes (Signed)
Attempted to call report x1, nurse will call back when available.

## 2023-02-21 NOTE — H&P (Signed)
History and Physical    DAITON BETSINGER ONG:[id number] DOB: 01/26/1946 DOA: 02/21/2023  PCP: de Peru, Raymond J, MD   Patient coming from: Home   Chief Complaint: SOB   HPI: Jeff Johnson is a 77 y.o. male with medical history significant for hypertension, hyperlipidemia, and severe aortic stenosis status post Bentall procedure who presents with shortness of breath.  Patient reports insidious worsening in his chronic dyspnea over the past 2 weeks or so.  He has also noticed recent swelling and discomfort in the lower right leg.  He denies any cough, chest pain, fever, or chills.  [facility] ED Course: Upon arrival to the ED, patient is found to be afebrile and saturating well on room air with normal RR, low HR, and stable blood pressure.  Chest x-ray is negative for acute cardiopulmonary disease and no RLE DVT is seen on ultrasound.  Labs are most notable for normal WBC, normal potassium, normal troponin, BNP 123, D-dimer 2.44, and bilirubin 2.3.  Patient was transferred to [facility] for admission.  Review of Systems:  All other systems reviewed and apart from HPI, are negative.  Past Medical History:  Diagnosis Date   Arthritis    Benign essential HTN 05/21/2014   BPH (benign prostatic hyperplasia)    Chronic cystitis    a. With ongoing hematuria.   Contrast media allergy    Diverticulitis    a. 2010: diverticulitis with stricture Sigmoid colectomy with mobilization of the splenic flexure within the end anastomosis.    Dizziness and giddiness    a. H/o dizziness with reportedly negative tilt table. Holter 2012: NSR, sinus tachy, frequent PVCs, multifocal at times in a trigeminal fashion.   Dyspnea on exertion 03/18/2014   Frequency of urination    GERD (gastroesophageal reflux disease)    H/O cardiac catheterization    a. 2011: minor luminal irregularities. b. Nuc 03/2014: ow risk without reversible ischemia or infarction, EF 57%. (There is septal akinesis but  otherwise normal wall motion. This is nonspecific.)   Headache    MIGRAINES   Hematuria    History of urinary retention    Hyperlipidemia 03/18/2014   Hyponatremia    a. Remote hx hyponatremia felt 2/2 SSRI.   Nocturia    Obesity    Orthostatic hypotension    Prostate cancer (HCC) 2011   a. s/p radiation.   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 11/2009: pericardial AVR/Bentall.   SYNCOPE 02/18/2010   Qualifier: Diagnosis of  By: Ladona Ridgel, MD, Jerrell Mylar    Thoracic aortic aneurysm [facility])    a. 11/2009: s/p Magna Ease pericardial tissue valve size 27mm and replacement of fusiform ascending thoracic aneurysm with 30-mm Hemashield cath with hemiarch reconstruction.    Past Surgical History:  Procedure Laterality Date   BACK SURGERY  1985   CARPAL TUNNEL RELEASE     BIL WRISTS   CHOLECYSTECTOMY  2010   COLON SURGERY  2010   colon resection   COLONOSCOPY  2008   [facility]   COLONOSCOPY WITH PROPOFOL N/A 09/02/2017   Procedure: COLONOSCOPY WITH PROPOFOL;  Surgeon: Sherrilyn Rist, MD;  Location: [facility] ENDOSCOPY;  Service: Gastroenterology;  Laterality: N/A;   doppler carotid  2012   DOPPLER ECHOCARDIOGRAPHY  2011   POLYPECTOMY  09/02/2017   Procedure: POLYPECTOMY;  Surgeon: Sherrilyn Rist, MD;  Location: [facility] ENDOSCOPY;  Service:  Gastroenterology;;   PROSTATE SURGERY  2011   adenocarcinoma w/raddiation    ROTATOR CUFF REPAIR     X2 LEFT SHOULDER   THORACIC AORTIC ANEURYSM REPAIR  12/11/09   ASCENDING THORACIC AORTIC ANEURYSM REPAIR   TISSUE AORTIC VALVE REPLACEMENT  12/11/09   TOTAL HIP ARTHROPLASTY Left 04/17/2015   Procedure: LEFT TOTAL HIP ARTHROPLASTY ANTERIOR APPROACH;  Surgeon: Samson Frederic, MD;  Location: [facility] ORS;  Service: Orthopedics;  Laterality: Left;    Social History:   reports that he quit smoking about 55 years ago. His smoking use included cigarettes. He has been exposed to tobacco smoke. He has never  used smokeless tobacco. He reports that he does not drink alcohol and does not use drugs.  Allergies  Allergen Reactions   Beta Adrenergic Blockers Other (See Comments)    Remote reportedh/o intolerance due to weakness per notes.Marland KitchenMarland KitchenMarland KitchenCalled patient. Advised patient of provider's approval for requested procedure, as well as any comments/instructions from provider.    Iodinated Contrast Media Anaphylaxis   Shellfish Allergy Anaphylaxis and Other (See Comments)   Shellfish-Derived Products Anaphylaxis    Family History  Problem Relation Age of Onset   Stroke Mother 48   Heart disease Father 75   Heart attack Father    Colon cancer Maternal Aunt    Stomach cancer Neg Hx    Esophageal cancer Neg Hx      Prior to Admission medications   Medication Sig Start Date End Date Taking? Authorizing Provider  acetaminophen (TYLENOL) 650 MG CR tablet Take 1,300 mg by mouth in the morning, at noon, and at bedtime.   Yes [provider]  amLODipine (NORVASC) 10 MG tablet Take 1 tablet (10 mg total) by mouth daily. Patient taking differently: Take 10 mg by mouth at bedtime. 08/30/22 02/27/23 Yes de Peru, Raymond J, MD  ASPIRIN 81 PO Take 1 tablet by mouth daily.   Yes [provider]  Cholecalciferol (VITAMIN D PO) Take 1 tablet by mouth daily.   Yes [provider]  hydrALAZINE (APRESOLINE) 25 MG tablet Take 1 tablet (25 mg total) by mouth 3 (three) times daily as needed (blood pressure more than 160 on the top number). 08/12/22  Yes Jodelle Red, MD  lansoprazole (PREVACID) 15 MG capsule Take 1 capsule (15 mg total) by mouth daily at 12 noon. 02/21/23  Yes de [place], Raymond J, MD  losartan (COZAAR) 50 MG tablet Take 1 tablet (50 mg total) by mouth daily. 02/08/23  Yes de [place], Raymond J, MD  montelukast (SINGULAIR) 10 MG tablet Take 1 tablet (10 mg total) by mouth daily. 11/17/22  Yes de Peru, Raymond J, MD  simvastatin (ZOCOR) 20 MG tablet Take 1 tablet (20 mg total) by  mouth daily. 11/17/22  Yes de Peru, Raymond J, MD  tamsulosin (FLOMAX) 0.4 MG CAPS capsule Take 1 capsule (0.4 mg total) by mouth daily. 08/25/22  Yes de [place], Raymond J, MD    Physical Exam: Vitals:   02/21/23 1245 02/21/23 1300 02/21/23 1330 02/21/23 1406  BP:  (!) 112/53 (!) 132/53   Pulse: 63 (!) 54 (!) 53   Resp: 15 10 14    Temp:    (!) 97.1 F (36.2 C)  TempSrc:    Temporal  SpO2: 100% 100% 100%   Weight:        Constitutional: NAD, no pallor or diaphoresis   Eyes: PERTLA, lids and conjunctivae normal ENMT: Mucous membranes are moist. Posterior pharynx clear of any exudate or lesions.  Neck: supple, no masses  Respiratory: no wheezing, no crackles. No accessory muscle use.  Cardiovascular: S1 & S2 heard, regular rate and rhythm. Mild pretibial pitting edema, right >left.  Abdomen: No distension, no tenderness, soft. Bowel sounds active.  Musculoskeletal: no clubbing / cyanosis. No joint deformity upper and lower extremities.   Skin: no significant rashes, lesions, ulcers. Warm, dry, well-perfused. Neurologic: CN 2-12 grossly intact. Moving all extremities. Alert and oriented.  Psychiatric: Calm. Cooperative.    Labs and Imaging on Admission: I have personally reviewed following labs and imaging studies  CBC: Recent Labs  Lab 02/21/23 1020  WBC 5.9  HGB 14.6  HCT 41.7  MCV 89.5  PLT 170   Basic Metabolic Panel: Recent Labs  Lab 02/21/23 1020  NA 138  K 4.1  CL 104  CO2 24  GLUCOSE 107*  BUN 22  CREATININE 1.18  CALCIUM 9.4   GFR: Estimated Creatinine Clearance: 67.3 mL/min (by C-G formula based on SCr of 1.18 mg/dL). Liver Function Tests: Recent Labs  Lab 02/21/23 1020  AST 19  ALT 13  ALKPHOS 63  BILITOT 2.3*  PROT 7.5  ALBUMIN 4.5   No results for input(s): "LIPASE", "AMYLASE" in the last 168 hours. No results for input(s): "AMMONIA" in the last 168 hours. Coagulation Profile: No results for input(s): "INR", "PROTIME" in the last 168  hours. Cardiac Enzymes: No results for input(s): "CKTOTAL", "CKMB", "CKMBINDEX", "TROPONINI" in the last 168 hours. BNP (last 3 results) No results for input(s): "PROBNP" in the last 8760 hours. HbA1C: No results for input(s): "HGBA1C" in the last 72 hours. CBG: No results for input(s): "GLUCAP" in the last 168 hours. Lipid Profile: No results for input(s): "CHOL", "HDL", "LDLCALC", "TRIG", "CHOLHDL", "LDLDIRECT" in the last 72 hours. Thyroid Function Tests: No results for input(s): "TSH", "T4TOTAL", "FREET4", "T3FREE", "THYROIDAB" in the last 72 hours. Anemia Panel: No results for input(s): "VITAMINB12", "FOLATE", "FERRITIN", "TIBC", "IRON", "RETICCTPCT" in the last 72 hours. Urine analysis:    Component Value Date/Time   COLORURINE STRAW (A) 01/14/2018 1541   APPEARANCEUR CLEAR 01/14/2018 1541   LABSPEC 1.010 01/14/2018 1541   LABSPEC 1.005 07/22/2009 1010   PHURINE 5.0 01/14/2018 1541   GLUCOSEU NEGATIVE 01/14/2018 1541   HGBUR NEGATIVE 01/14/2018 1541   BILIRUBINUR Negative 03/11/2022 1049   BILIRUBINUR Color Interference 07/22/2009 1010   KETONESUR NEGATIVE 01/14/2018 1541   PROTEINUR Positive (A) 03/11/2022 1049   PROTEINUR NEGATIVE 01/14/2018 1541   UROBILINOGEN 0.2 03/11/2022 1049   UROBILINOGEN 0.2 12/12/2014 0442   NITRITE Negative 03/11/2022 1049   NITRITE NEGATIVE 01/14/2018 1541   LEUKOCYTESUR Trace (A) 03/11/2022 1049   LEUKOCYTESUR Color Interference 07/22/2009 1010   Sepsis Labs: @LABRCNTIP (procalcitonin:4,lacticidven:4) )No results found for this or any previous visit (from the past 240 hour(s)).   Radiological Exams on Admission: US Venous Img Lower Unilateral Right  Result Date: 02/21/2023 CLINICAL DATA:  Right lower extremity pain and edema for the past 2 weeks. Shortness of breath with exertion. Evaluate for DVT. EXAM: RIGHT LOWER EXTREMITY VENOUS DOPPLER ULTRASOUND TECHNIQUE: Gray-scale sonography with graded compression, as well as color Doppler and  duplex ultrasound were performed to evaluate the lower extremity deep venous systems from the level of the common femoral vein and including the common femoral, femoral, profunda femoral, popliteal and calf veins including the posterior tibial, peroneal and gastrocnemius veins when visible. The superficial great saphenous vein was also interrogated. Spectral Doppler was utilized to evaluate flow at rest and with distal augmentation maneuvers in  the common femoral, femoral and popliteal veins. COMPARISON:  None Available. FINDINGS: Contralateral Common Femoral Vein: Respiratory phasicity is normal and symmetric with the symptomatic side. No evidence of thrombus. Normal compressibility. Common Femoral Vein: No evidence of thrombus. Normal compressibility, respiratory phasicity and response to augmentation. Saphenofemoral Junction: No evidence of thrombus. Normal compressibility and flow on color Doppler imaging. Profunda Femoral Vein: No evidence of thrombus. Normal compressibility and flow on color Doppler imaging. Femoral Vein: No evidence of thrombus. Normal compressibility, respiratory phasicity and response to augmentation. Popliteal Vein: No evidence of thrombus. Normal compressibility, respiratory phasicity and response to augmentation. Calf Veins: No evidence of thrombus. Normal compressibility and flow on color Doppler imaging. Superficial Great Saphenous Vein: No evidence of thrombus. Normal compressibility. Other Findings:  None. IMPRESSION: No evidence of DVT within the right lower extremity. Electronically Signed   By: Simonne Come M.D.   On: 02/21/2023 11:42   DG Chest 2 View  Result Date: 02/21/2023 CLINICAL DATA:  Shortness of breath. EXAM: CHEST - 2 VIEW COMPARISON:  04/17/2022. FINDINGS: Bilateral lung fields are clear. Bilateral costophrenic angles are clear. Stable cardio-mediastinal silhouette. Median sternotomy noted. No acute osseous abnormalities. The soft tissues are within normal limits.  IMPRESSION: *No active cardiopulmonary disease. Electronically Signed   By: Jules Schick M.D.   On: 02/21/2023 10:40    EKG: Independently reviewed. Indeterminate rhythm, rate 60, LAD.   Assessment/Plan   1. SOB; bradycardia  - RR is normal, he is saturating 100% on rm air, and there is no chest pain, cough, or DVT to suggest PE     - HR is in 30s and symptoms may be related to bradycardia  - Continue cardiac monitoring, check echocardiogram, TSH, and magnesium   2. Hypertension  - Diastolic pressure is low, will hold antihypertensives for now    3. HLD  - Zocor    DVT prophylaxis: Lovenox  Code Status: Full  Level of Care: Level of care: Telemetry Family Communication: None present  Disposition Plan:  Patient is from: home  Anticipated d/c is to: Home  Anticipated d/c date is: 02/23/23  Patient currently: Pending echocardiogram, additional labs, cardiac monitoring  Consults called: None  Admission status: Observation     Briscoe Deutscher, MD [facility]  02/21/2023, 3:51 PM

## 2023-02-21 NOTE — ED Notes (Signed)
 Called [facility] for transport, pt bed assignment ready

## 2023-02-21 NOTE — ED Provider Notes (Signed)
[name] Provider Note   CSN: [id number] Arrival date & time: 02/21/23  [id number]     History  Chief Complaint  Patient presents with   Shortness of Breath    Jeff Johnson is a 77 y.o. male.  Is a 77 year old male present to the emergency department for shortness of breath.  Reports over the past 3 weeks has had worsening dyspnea on exertion.  States that he is roughly walking half as far as he normally does without getting winded and lightheaded.  No chest pain no shortness of breath.  Notes lower extremity edema.  Right lower extremity several days ago was twice the size of his left, but does note that bilateral lower extremities are edematous.  Reports history of CHF and taking water pill.  He also notes baseline heart rate in the 50s with leaky aortic valve.   Shortness of Breath      Home Medications Prior to Admission medications   Medication Sig Start Date End Date Taking? Authorizing Provider  acetaminophen (TYLENOL) 650 MG CR tablet Take 1,300 mg by mouth in the morning, at noon, and at bedtime.   Yes [provider]  amLODipine (NORVASC) 10 MG tablet Take 1 tablet (10 mg total) by mouth daily. Patient taking differently: Take 10 mg by mouth at bedtime. 08/30/22 02/27/23 Yes de [place], Raymond J, MD  ASPIRIN 81 PO Take 1 tablet by mouth daily.   Yes [provider]  Cholecalciferol (VITAMIN D PO) Take 1 tablet by mouth daily.   Yes [provider]  hydrALAZINE (APRESOLINE) 25 MG tablet Take 1 tablet (25 mg total) by mouth 3 (three) times daily as needed (blood pressure more than 160 on the top number). 08/12/22  Yes Jodelle Red, MD  lansoprazole (PREVACID) 15 MG capsule Take 1 capsule (15 mg total) by mouth daily at 12 noon. 02/21/23  Yes de [place], Raymond J, MD  losartan (COZAAR) 50 MG tablet Take 1 tablet (50 mg total) by mouth daily. 02/08/23  Yes de [place], Raymond J, MD  montelukast (SINGULAIR) 10  MG tablet Take 1 tablet (10 mg total) by mouth daily. 11/17/22  Yes de [place], Raymond J, MD  simvastatin (ZOCOR) 20 MG tablet Take 1 tablet (20 mg total) by mouth daily. 11/17/22  Yes de [place], Raymond J, MD  tamsulosin (FLOMAX) 0.4 MG CAPS capsule Take 1 capsule (0.4 mg total) by mouth daily. 08/25/22  Yes de [place], Raymond J, MD      Allergies    Beta adrenergic blockers, Iodinated contrast media, Shellfish allergy, and Shellfish-derived products    Review of Systems   Review of Systems  Respiratory:  Positive for shortness of breath.     Physical Exam Updated Vital Signs BP (!) 132/53   Pulse (!) 53   Temp (!) 97.1 F (36.2 C) (Temporal)   Resp 14   Wt 117.9 kg   SpO2 100%   BMI 35.26 kg/m  Physical Exam Vitals and nursing note reviewed.  Constitutional:      Appearance: He is obese.     Comments: Tired appearing  Cardiovascular:     Rate and Rhythm: Bradycardia present. Rhythm irregular.  Pulmonary:     Effort: Pulmonary effort is normal.     Breath sounds: Normal breath sounds. No wheezing, rhonchi or rales.  Musculoskeletal:     Cervical back: Normal range of motion.     Right lower leg: Edema present.  Left lower leg: Edema present.  Skin:    General: Skin is warm.     Capillary Refill: Capillary refill takes less than 2 seconds.  Neurological:     Mental Status: He is alert and oriented to person, place, and time.  Psychiatric:        Mood and Affect: Mood normal.        Behavior: Behavior normal.     ED Results / Procedures / Treatments   Labs (all labs ordered are listed, but only abnormal results are displayed) Labs Reviewed  COMPREHENSIVE METABOLIC PANEL - Abnormal; Notable for the following components:      Result Value   Glucose, Bld 107 (*)    Total Bilirubin 2.3 (*)    All other components within normal limits  BRAIN NATRIURETIC PEPTIDE - Abnormal; Notable for the following components:   B Natriuretic Peptide 122.6 (*)    All other components  within normal limits  D-DIMER, QUANTITATIVE - Abnormal; Notable for the following components:   D-Dimer, Quant 2.44 (*)    All other components within normal limits  CBC  TROPONIN I (HIGH SENSITIVITY)  TROPONIN I (HIGH SENSITIVITY)    EKG EKG Interpretation Date/Time:  Monday February 21 2023 10:03:34 EST Ventricular Rate:  60 PR Interval:    QRS Duration:  108 QT Interval:  466 QTC Calculation: 481 R Axis:   -51  Text Interpretation: Atrial fibrillation Left axis deviation Borderline repolarization abnormality Borderline prolonged QT interval Confirmed by Estanislado Pandy [phone]) on 02/21/2023 10:19:07 AM  Radiology US Venous Img Lower Unilateral Right  Result Date: 02/21/2023 CLINICAL DATA:  Right lower extremity pain and edema for the past 2 weeks. Shortness of breath with exertion. Evaluate for DVT. EXAM: RIGHT LOWER EXTREMITY VENOUS DOPPLER ULTRASOUND TECHNIQUE: Gray-scale sonography with graded compression, as well as color Doppler and duplex ultrasound were performed to evaluate the lower extremity deep venous systems from the level of the common femoral vein and including the common femoral, femoral, profunda femoral, popliteal and calf veins including the posterior tibial, peroneal and gastrocnemius veins when visible. The superficial great saphenous vein was also interrogated. Spectral Doppler was utilized to evaluate flow at rest and with distal augmentation maneuvers in the common femoral, femoral and popliteal veins. COMPARISON:  None Available. FINDINGS: Contralateral Common Femoral Vein: Respiratory phasicity is normal and symmetric with the symptomatic side. No evidence of thrombus. Normal compressibility. Common Femoral Vein: No evidence of thrombus. Normal compressibility, respiratory phasicity and response to augmentation. Saphenofemoral Junction: No evidence of thrombus. Normal compressibility and flow on color Doppler imaging. Profunda Femoral Vein: No evidence of thrombus.  Normal compressibility and flow on color Doppler imaging. Femoral Vein: No evidence of thrombus. Normal compressibility, respiratory phasicity and response to augmentation. Popliteal Vein: No evidence of thrombus. Normal compressibility, respiratory phasicity and response to augmentation. Calf Veins: No evidence of thrombus. Normal compressibility and flow on color Doppler imaging. Superficial Great Saphenous Vein: No evidence of thrombus. Normal compressibility. Other Findings:  None. IMPRESSION: No evidence of DVT within the right lower extremity. Electronically Signed   By: Simonne Come M.D.   On: 02/21/2023 11:42   DG Chest 2 View  Result Date: 02/21/2023 CLINICAL DATA:  Shortness of breath. EXAM: CHEST - 2 VIEW COMPARISON:  04/17/2022. FINDINGS: Bilateral lung fields are clear. Bilateral costophrenic angles are clear. Stable cardio-mediastinal silhouette. Median sternotomy noted. No acute osseous abnormalities. The soft tissues are within normal limits. IMPRESSION: *No active cardiopulmonary disease. Electronically Signed   By:  Jules Schick M.D.   On: 02/21/2023 10:40    Procedures Procedures    Medications Ordered in ED Medications  enoxaparin (LOVENOX) injection 40 mg (has no administration in time range)  sodium chloride flush (NS) 0.9 % injection 3 mL (has no administration in time range)  acetaminophen (TYLENOL) tablet 650 mg (has no administration in time range)    Or  acetaminophen (TYLENOL) suppository 650 mg (has no administration in time range)  senna-docusate (Senokot-S) tablet 1 tablet (has no administration in time range)    ED Course/ Medical Decision Making/ A&P Clinical Course as of 02/21/23 1535  Mon Feb 21, 2023  1124 Per cardiology note in July of this year : "Hypertension -with lability; ok today but can have very high numbers -continue amlodipine, losartan, PRN hydralazine   Dyspnea on exertion S/P AVR with aortic root repair -continue to monitor  symptoms -continue aspirin -needs antibiotic prophylaxis prior to dental procedures   Mixed hyperlipidemia -continue simvastatin   Cardiac risk counseling and prevention recommendations: -recommend heart healthy/Mediterranean diet, with whole grains, fruits, vegetable, fish, lean meats, nuts, and olive oil. Limit salt. -recommend moderate walking, 3-5 times/week for 30-50 minutes each session. Aim for at least 150 minutes.week. Goal should be pace of 3 miles/hours, or walking 1.5 miles in 30 minutes -recommend avoidance of tobacco products. Avoid excess alcohol.   Dispo: Follow-up in 6 months, or sooner as needed. "   [TY]  1124 Appears last echo in 2022 with normal EF. [TY]  1146 US Venous Img Lower Unilateral Right IMPRESSION: No evidence of DVT within the right lower extremity.   [TY]    Clinical Course User Index [TY] Coral Spikes, DO                                 Medical Decision Making 77 year old male with complex past medical history to include morbid obesity, aortic valve repair, thoracic aneurysm diverticulitis prostate cancer, CAD presented emergency department for worsening shortness of breath.  Reports history of CHF, however per my chart review does not carry diagnosis.  His EKG with no ST segment changes to indicate ischemia on my independent interpretation.  Troponin negative.  ACS less likely.  BNP mildly elevated 122.  Chest x-ray clear with no pulmonary edema, but does show some vascular congestion.  Given his history of prostate cancer and his report that his right lower extremity was larger than left concern for possible PE/DVT.  D-dimer is elevated 2.4.  Ultrasound negative for DVT.  Unfortunately patient does have anaphylactic reaction to contrast.  Given Lasix here for possible CHF component.  However given elevated D-dimer with his shortness of breath and clear lungs cannot exclude PE.  Will admit for VQ scan as well as further workup for his acute dyspnea.   Question possible valvular involvement.  Amount and/or Complexity of Data Reviewed Labs: ordered. Radiology: ordered. Decision-making details documented in ED Course.  Risk Decision regarding hospitalization.          Final Clinical Impression(s) / ED Diagnoses Final diagnoses:  Dyspnea, unspecified type    Rx / DC Orders ED Discharge Orders     None         Coral Spikes, DO 02/21/23 1540

## 2023-02-21 NOTE — Plan of Care (Signed)

## 2023-02-22 ENCOUNTER — Observation Stay (HOSPITAL_BASED_OUTPATIENT_CLINIC_OR_DEPARTMENT_OTHER): Payer: Medicare Other

## 2023-02-22 DIAGNOSIS — R0609 Other forms of dyspnea: Secondary | ICD-10-CM

## 2023-02-22 DIAGNOSIS — I5031 Acute diastolic (congestive) heart failure: Secondary | ICD-10-CM | POA: Diagnosis not present

## 2023-02-22 DIAGNOSIS — I482 Chronic atrial fibrillation, unspecified: Secondary | ICD-10-CM

## 2023-02-22 DIAGNOSIS — R0602 Shortness of breath: Secondary | ICD-10-CM | POA: Diagnosis not present

## 2023-02-22 DIAGNOSIS — R001 Bradycardia, unspecified: Secondary | ICD-10-CM | POA: Diagnosis not present

## 2023-02-22 DIAGNOSIS — I5033 Acute on chronic diastolic (congestive) heart failure: Secondary | ICD-10-CM | POA: Insufficient documentation

## 2023-02-22 LAB — CBC
HCT: 41.2 % (ref 39.0–52.0)
Hemoglobin: 13.5 g/dL (ref 13.0–17.0)
MCH: 30.7 pg (ref 26.0–34.0)
MCHC: 32.8 g/dL (ref 30.0–36.0)
MCV: 93.6 fL (ref 80.0–100.0)
Platelets: 155 10*3/uL (ref 150–400)
RBC: 4.4 MIL/uL (ref 4.22–5.81)
RDW: 12.4 % (ref 11.5–15.5)
WBC: 5.6 10*3/uL (ref 4.0–10.5)
nRBC: 0 % (ref 0.0–0.2)

## 2023-02-22 LAB — ECHOCARDIOGRAM COMPLETE
AV Mean grad: 5 mm[Hg]
AV Peak grad: 9 mm[Hg]
Ao pk vel: 1.5 m/s
Area-P 1/2: 3.37 cm2
Height: 72 in
S' Lateral: 4.6 cm
Weight: 3996.5 [oz_av]

## 2023-02-22 LAB — BASIC METABOLIC PANEL
Anion gap: 10 (ref 5–15)
BUN: 25 mg/dL — ABNORMAL HIGH (ref 8–23)
CO2: 22 mmol/L (ref 22–32)
Calcium: 9.2 mg/dL (ref 8.9–10.3)
Chloride: 106 mmol/L (ref 98–111)
Creatinine, Ser: 1.12 mg/dL (ref 0.61–1.24)
GFR, Estimated: >60 mL/min (ref >60)
Glucose, Bld: 100 mg/dL — ABNORMAL HIGH (ref 70–99)
Potassium: 4.1 mmol/L (ref 3.5–5.1)
Sodium: 138 mmol/L (ref 135–145)

## 2023-02-22 LAB — HEPATIC FUNCTION PANEL
ALT: 16 U/L (ref 0–44)
AST: 20 U/L (ref 15–41)
Albumin: 4 g/dL (ref 3.5–5.0)
Alkaline Phosphatase: 53 U/L (ref 38–126)
Bilirubin, Direct: 0.3 mg/dL — ABNORMAL HIGH (ref 0.0–0.2)
Indirect Bilirubin: 2.1 mg/dL — ABNORMAL HIGH (ref 0.3–0.9)
Total Bilirubin: 2.4 mg/dL — ABNORMAL HIGH (ref <1.2)
Total Protein: 7 g/dL (ref 6.5–8.1)

## 2023-02-22 LAB — MAGNESIUM: Magnesium: 2.3 mg/dL (ref 1.7–2.4)

## 2023-02-22 LAB — TSH: TSH: 2.243 u[IU]/mL (ref 0.350–4.500)

## 2023-02-22 MED ORDER — FUROSEMIDE 10 MG/ML IJ SOLN
40.0000 mg | Freq: Once | INTRAMUSCULAR | Status: AC
  Administered 2023-02-22: 40 mg via INTRAVENOUS
  Filled 2023-02-22: qty 4

## 2023-02-22 MED ORDER — PERFLUTREN LIPID MICROSPHERE
1.0000 mL | INTRAVENOUS | Status: AC | PRN
  Administered 2023-02-22: 2 mL via INTRAVENOUS

## 2023-02-22 NOTE — Progress Notes (Signed)
[facility] PROGRESS NOTE    Progress Note  Jeff Johnson  XLK:[id number] DOB: August 21, 1945 DOA: 02/21/2023 PCP: de [place], Raymond J, MD     Brief Narrative:   Jeff Johnson is an 77 y.o. 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 On telemetry he has been having about 2.2 pauses.  With heart rates in the 30s. I am concerned about third-degree AV block. 2D echo is pending. Will consult cardiology.  Hyperlipidemia Continue statins.  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  DVT prophylaxis: lovenox Family Communication:none Status is: Observation The patient remains OBS appropriate and will d/c before 2 midnights.    Code Status:     Code Status Orders  (From admission, onward)           Start     Ordered   02/21/23 1525  Full code  Continuous       Question:  By:  Answer:  Consent: discussion documented in EHR   02/21/23 1526           Code Status History     Date Active Date Inactive Code Status Order ID Comments User Context   03/18/2014 1742 03/20/2014 2117 Full Code [id number]  Cathren Harsh, MD Inpatient         IV Access:   Peripheral IV   Procedures and diagnostic studies:   US Venous Img Lower Unilateral Right  Result Date: 02/21/2023 CLINICAL DATA:  Right lower extremity pain and edema for  the past 2 weeks. Shortness of breath with exertion. Evaluate for DVT. EXAM: RIGHT LOWER EXTREMITY VENOUS DOPPLER ULTRASOUND TECHNIQUE: Gray-scale sonography with graded compression, as well as color Doppler and duplex ultrasound were performed to evaluate the lower extremity deep venous systems from the level of the common femoral vein and including the common femoral, femoral, profunda femoral, popliteal and calf veins including the posterior tibial, peroneal and gastrocnemius veins when visible. The superficial great saphenous vein was also interrogated. Spectral Doppler was utilized to evaluate flow at rest and with distal augmentation maneuvers in the common femoral, femoral and popliteal veins. COMPARISON:  None Available. FINDINGS: Contralateral Common Femoral Vein: Respiratory phasicity is normal and symmetric with the symptomatic side. No evidence of thrombus. Normal compressibility. Common Femoral Vein: No evidence of thrombus. Normal compressibility, respiratory phasicity and response to augmentation. Saphenofemoral Junction: No evidence of thrombus. Normal compressibility and flow on color Doppler imaging. Profunda Femoral Vein: No evidence of thrombus. Normal compressibility and flow on color Doppler imaging. Femoral Vein: No evidence of thrombus. Normal compressibility, respiratory phasicity and response to augmentation. Popliteal Vein: No evidence of thrombus. Normal compressibility, respiratory phasicity and response to augmentation. Calf Veins: No evidence of thrombus. Normal compressibility and flow on color Doppler imaging. Superficial Great Saphenous Vein: No evidence of thrombus. Normal compressibility. Other Findings:  None. IMPRESSION: No evidence of DVT within the right lower extremity.  Electronically Signed   By: Simonne Come M.D.   On: 02/21/2023 11:42   DG Chest 2 View  Result Date: 02/21/2023 CLINICAL DATA:  Shortness of breath. EXAM: CHEST - 2 VIEW COMPARISON:  04/17/2022. FINDINGS:  Bilateral lung fields are clear. Bilateral costophrenic angles are clear. Stable cardio-mediastinal silhouette. Median sternotomy noted. No acute osseous abnormalities. The soft tissues are within normal limits. IMPRESSION: *No active cardiopulmonary disease. Electronically Signed   By: Jules Schick M.D.   On: 02/21/2023 10:40     Medical Consultants:   None.   Subjective:    Jeff Johnson continues to be short of breath even while sitting in the bed  Objective:    Vitals:   02/21/23 2050 02/22/23 0024 02/22/23 0423 02/22/23 0500  BP: 121/66 117/71 129/69   Pulse: 60 (!) 53 (!) 54   Resp: 20 16 15    Temp: 97.8 F (36.6 C) 97.7 F (36.5 C) 97.6 F (36.4 C)   TempSrc: Oral Oral    SpO2: 99% 97% 99%   Weight:    113.3 kg   SpO2: 99 %   Intake/Output Summary (Last 24 hours) at 02/22/2023 0611 Last data filed at 02/22/2023 0550 Gross per 24 hour  Intake 240 ml  Output 350 ml  Net -110 ml   Filed Weights   02/21/23 1004 02/22/23 0500  Weight: 117.9 kg 113.3 kg    Exam: General exam: In no acute distress. Respiratory system: Good air movement and clear to auscultation. Cardiovascular system: S1 & S2 heard, RRR. No JVD. Gastrointestinal system: Abdomen is nondistended, soft and nontender.  Extremities: No pedal edema. Skin: No rashes, lesions or ulcers Psychiatry: Judgement and insight appear normal. Mood & affect appropriate.    Data Reviewed:    Labs: Basic Metabolic Panel: Recent Labs  Lab 02/21/23 1020 02/22/23 0436  NA 138 138  K 4.1 4.1  CL 104 106  CO2 24 22  GLUCOSE 107* 100*  BUN 22 25*  CREATININE 1.18 1.12  CALCIUM 9.4 9.2  MG  --  2.3   GFR Estimated Creatinine Clearance: 69.5 mL/min (by C-G formula based on SCr of 1.12 mg/dL). Liver Function Tests: Recent Labs  Lab 02/21/23 1020 02/22/23 0436  AST 19 20  ALT 13 16  ALKPHOS 63 53  BILITOT 2.3* 2.4*  PROT 7.5 7.0  ALBUMIN 4.5 4.0   No results for input(s): "LIPASE", "AMYLASE"  in the last 168 hours. No results for input(s): "AMMONIA" in the last 168 hours. Coagulation profile No results for input(s): "INR", "PROTIME" in the last 168 hours. COVID-19 Labs  Recent Labs    02/21/23 1020  DDIMER 2.44*    Lab Results  Component Value Date   SARSCOV2NAA POSITIVE (A) 04/17/2022   SARSCOV2NAA NEGATIVE 03/16/2021   SARSCOV2NAA Not Detected 09/22/2018    CBC: Recent Labs  Lab 02/21/23 1020 02/22/23 0436  WBC 5.9 5.6  HGB 14.6 13.5  HCT 41.7 41.2  MCV 89.5 93.6  PLT 170 155   Cardiac Enzymes: No results for input(s): "CKTOTAL", "CKMB", "CKMBINDEX", "TROPONINI" in the last 168 hours. BNP (last 3 results) No results for input(s): "PROBNP" in the last 8760 hours. CBG: No results for input(s): "GLUCAP" in the last 168 hours. D-Dimer: Recent Labs    02/21/23 1020  DDIMER 2.44*   Hgb A1c: No results for input(s): "HGBA1C" in the last 72 hours. Lipid Profile: No results for input(s): "CHOL", "HDL", "LDLCALC", "TRIG", "CHOLHDL", "LDLDIRECT" in the last 72 hours. Thyroid function  studies: Recent Labs    02/22/23 0436  TSH 2.243   Anemia work up: No results for input(s): "VITAMINB12", "FOLATE", "FERRITIN", "TIBC", "IRON", "RETICCTPCT" in the last 72 hours. Sepsis Labs: Recent Labs  Lab 02/21/23 1020 02/22/23 0436  WBC 5.9 5.6   Microbiology No results found for this or any previous visit (from the past 240 hour(s)).   Medications:    aspirin EC  81 mg Oral Daily   enoxaparin (LOVENOX) injection  60 mg Subcutaneous Q24H   montelukast  10 mg Oral QHS   pantoprazole  20 mg Oral Daily   simvastatin  20 mg Oral Daily   sodium chloride flush  3 mL Intravenous Q12H   tamsulosin  0.4 mg Oral Daily   Continuous Infusions:    LOS: 0 days   Marinda Elk  [facility]  02/22/2023, 6:11 AM

## 2023-02-22 NOTE — Plan of Care (Signed)

## 2023-02-22 NOTE — Progress Notes (Signed)
*  PRELIMINARY RESULTS* Echocardiogram 2D Echocardiogram has been performed.  Jeff Johnson 02/22/2023, 12:40 PM

## 2023-02-22 NOTE — Consult Note (Addendum)
Cardiology Consultation   Patient ID: Jeff Johnson MRN: [id number]; DOB: [birth year]  Admit date: 02/21/2023 Date of Consult: 02/22/2023  PCP:  de Peru, Raymond J, MD   [facility] Providers Cardiologist:  Jodelle Red, MD   {    Patient Profile:   Jeff Johnson is a 77 y.o. 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  History of Present Illness:   Jeff Johnson with above PMH presented to ER yesterday for SOB. Patient works in Cabin crew currently. He states he suffer chronic dyspnea since his heart surgery in 2011. He noted increased SOB with exertion and generalized malaise since the end of Nov 2024. He reports getting much winded with minimal activity such as walking to his car. He reports significant fatigue and weakness overall. He denied ever having chest pain, dizziness, syncope, recent infection. He noted skipped heart beat occasionally. He takes baby aspirin daily. He noted his BP was high 212/104 once at the end of Nov 2024 and he took PRN hydralazine x1 with good effect. He started monitoring his HR 2 weeks ago, mostly 40-60bpm, which is slower than his usual HR 60s. Last few days he noticed himself dipping into 30s. He felt he had more swelling of lower extremity than usual, R>L. He believes he is on a diuretic and has been urinating a lot while taking it.   Per chart review, he underwent  biologic Bentall root replacement 2011 for ascending aneurysm with aortic insufficiency. He follows Dr Cristal Deer for resistant HTN and HLD. Most recent stress myoview from 10/20/2016 showed EF 58%, low risk normal study. Most recent Echo from 02/13/2021 showed LVEF 55-60%, no RWMA, mild LVH, indeterminate diastolic parameter, normal RV, PASP 25.3 mmHg, mild LAE, mild MR, 27 mm Magna-ease  pericardial valve present in the aortic position, normal structure and function of the aortic valve prosthesis, no AI. Aortic root/ascending aorta has been repaired/replaced and with 30 mm Hemashield straight graft with hemi-arch reconstruction. Normal appearance of ascending aorta replacement. He has not had any CT chest since 08/2016.  He was last seen in the office 10/13/22, felt BP with lability, he was advised to take amlodipine 5mg , losartan 50mg , and PRN hydralazine. He is not on any AVN blocking agents or medication causes bradycardia.   Admission labs from 02/21/23 showed elevated TB 2.3, otherwise CMP and CBC grossly unremarkable. BNP 122.6. Hs trop 9 >8. TSH WNL. CXR no acute finding. Doppler of RLE negative for DVT. EKG showed sinus rhythm 60bpm, first degree AVB, <2 second pause, manual Qtc , overall similar appearance to previous EKG. Cardiology is consulted for concern of slowed A fib /third degree AVB today.     Past Medical History:  Diagnosis Date   Arthritis    Benign essential HTN 05/21/2014   BPH (benign prostatic hyperplasia)    Chronic cystitis    a. With ongoing hematuria.   Contrast media allergy    Diverticulitis    a. 2010: diverticulitis with stricture Sigmoid colectomy with mobilization of the splenic flexure within the end anastomosis.    Dizziness and giddiness    a. H/o dizziness with reportedly negative tilt table. Holter 2012: NSR, sinus tachy, frequent PVCs, multifocal at times in a trigeminal fashion.   Dyspnea on exertion 03/18/2014   Frequency of urination  GERD (gastroesophageal reflux disease)    H/O cardiac catheterization    a. 2011: minor luminal irregularities. b. Nuc 03/2014: ow risk without reversible ischemia or infarction, EF 57%. (There is septal akinesis but otherwise normal wall motion. This is nonspecific.)   Headache    MIGRAINES   Hematuria    History of urinary retention    Hyperlipidemia 03/18/2014   Hyponatremia    a. Remote hx  hyponatremia felt 2/2 SSRI.   Nocturia    Obesity    Orthostatic hypotension    Prostate cancer (HCC) 2011   a. s/p radiation.   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 11/2009: pericardial AVR/Bentall.   SYNCOPE 02/18/2010   Qualifier: Diagnosis of  By: Ladona Ridgel, MD, Jerrell Mylar    Thoracic aortic aneurysm [facility])    a. 11/2009: s/p Magna Ease pericardial tissue valve size 27mm and replacement of fusiform ascending thoracic aneurysm with 30-mm Hemashield cath with hemiarch reconstruction.    Past Surgical History:  Procedure Laterality Date   BACK SURGERY  1985   CARPAL TUNNEL RELEASE     BIL WRISTS   CHOLECYSTECTOMY  2010   COLON SURGERY  2010   colon resection   COLONOSCOPY  2008   [facility]   COLONOSCOPY WITH PROPOFOL N/A 09/02/2017   Procedure: COLONOSCOPY WITH PROPOFOL;  Surgeon: Sherrilyn Rist, MD;  Location: [facility];  Service: Gastroenterology;  Laterality: N/A;   doppler carotid  2012   DOPPLER ECHOCARDIOGRAPHY  2011   POLYPECTOMY  09/02/2017   Procedure: POLYPECTOMY;  Surgeon: Sherrilyn Rist, MD;  Location: [facility];  Service: Gastroenterology;;   PROSTATE SURGERY  2011   adenocarcinoma w/raddiation    ROTATOR CUFF REPAIR     X2 LEFT SHOULDER   THORACIC AORTIC ANEURYSM REPAIR  12/11/09   ASCENDING THORACIC AORTIC ANEURYSM REPAIR   TISSUE AORTIC VALVE REPLACEMENT  12/11/09   TOTAL HIP ARTHROPLASTY Left 04/17/2015   Procedure: LEFT TOTAL HIP ARTHROPLASTY ANTERIOR APPROACH;  Surgeon: Samson Frederic, MD;  Location: [facility] ORS;  Service: Orthopedics;  Laterality: Left;     Home Medications:  Prior to Admission medications   Medication Sig Start Date End Date Taking? Authorizing Provider  acetaminophen (TYLENOL) 650 MG CR tablet Take 1,300 mg by mouth 3 (three) times daily as needed for pain or fever.   Yes [provider]  amLODipine (NORVASC) 10 MG tablet Take 1 tablet (10 mg total) by mouth  daily. Patient taking differently: Take 10 mg by mouth at bedtime. 08/30/22 02/27/23 Yes de [place], Raymond J, MD  ASPIRIN 81 PO Take 1 tablet by mouth daily.   Yes [provider]  Cholecalciferol (VITAMIN D PO) Take 1 tablet by mouth daily.   Yes [provider]  hydrALAZINE (APRESOLINE) 25 MG tablet Take 1 tablet (25 mg total) by mouth 3 (three) times daily as needed (blood pressure more than 160 on the top number). 08/12/22  Yes Jodelle Red, MD  lansoprazole (PREVACID) 15 MG capsule Take 1 capsule (15 mg total) by mouth daily at 12 noon. 02/21/23  Yes de [place], Raymond J, MD  losartan (COZAAR) 50 MG tablet Take 1 tablet (50 mg total) by mouth daily. 02/08/23  Yes de [place], Raymond J, MD  montelukast (SINGULAIR) 10 MG tablet Take 1 tablet (10 mg total) by mouth daily. 11/17/22  Yes de [place], Raymond J, MD  simvastatin [facility])  20 MG tablet Take 1 tablet (20 mg total) by mouth daily. 11/17/22  Yes de [place], Raymond J, MD  tamsulosin (FLOMAX) 0.4 MG CAPS capsule Take 1 capsule (0.4 mg total) by mouth daily. 08/25/22  Yes de [place], Raymond J, MD    Inpatient Medications: Scheduled Meds:  aspirin EC  81 mg Oral Daily   enoxaparin (LOVENOX) injection  60 mg Subcutaneous Q24H   montelukast  10 mg Oral QHS   pantoprazole  20 mg Oral Daily   simvastatin  20 mg Oral Daily   sodium chloride flush  3 mL Intravenous Q12H   tamsulosin  0.4 mg Oral Daily   Continuous Infusions:  PRN Meds: acetaminophen **OR** acetaminophen, senna-docusate  Allergies:    Allergies  Allergen Reactions   Beta Adrenergic Blockers Other (See Comments)    Remote reportedh/o intolerance due to weakness per notes.Marland KitchenMarland KitchenMarland KitchenCalled patient. Advised patient of provider's approval for requested procedure, as well as any comments/instructions from provider.    Iodinated Contrast Media Anaphylaxis   Shellfish Allergy Anaphylaxis and Other (See Comments)   Shellfish-Derived Products Anaphylaxis    Social History:    Social History   Socioeconomic History   Marital status: Married    Spouse name: Not on file   Number of children: Not on file   Years of education: Not on file   Highest education level: Not on file  Occupational History   Not on file  Tobacco Use   Smoking status: Former    Current packs/day: 0.00    Types: Cigarettes    Quit date: 03/16/1967    Years since quitting: 55.9    Passive exposure: Past   Smokeless tobacco: Never  Vaping Use   Vaping status: Never Used  Substance and Sexual Activity   Alcohol use: No   Drug use: No   Sexual activity: Not on file  Other Topics Concern   Not on file  Social History Narrative   Not on file   Social Determinants of Health   Financial Resource Strain: Low Risk  (08/10/2022)   Overall Financial Resource Strain (CARDIA)    Difficulty of Paying Living Expenses: Not hard at all  Food Insecurity: No Food Insecurity (02/21/2023)   Hunger Vital Sign    Worried About Running Out of Food in the Last Year: Never true    Ran Out of Food in the Last Year: Never true  Transportation Needs: No Transportation Needs (02/21/2023)   PRAPARE - Administrator, Civil Service (Medical): No    Lack of Transportation (Non-Medical): No  Physical Activity: Inactive (08/10/2022)   Exercise Vital Sign    Days of Exercise per Week: 0 days    Minutes of Exercise per Session: 0 min  Stress: No Stress Concern Present (08/10/2022)   [employer] of Occupational Health - Occupational Stress Questionnaire    Feeling of Stress : Not at all  Social Connections: Socially Integrated (08/10/2022)   Social Connection and Isolation Panel [NHANES]    Frequency of Communication with Friends and Family: More than three times a week    Frequency of Social Gatherings with Friends and Family: More than three times a week    Attends Religious Services: More than 4 times per year    Active Member of [employer] or Organizations: Yes    Attends Banker  Meetings: More than 4 times per year    Marital Status: Married  Catering manager Violence: Not At Risk (02/21/2023)   Humiliation, Afraid,  Rape, and Kick questionnaire    Fear of Current or Ex-Partner: No    Emotionally Abused: No    Physically Abused: No    Sexually Abused: No    Family History:    Family History  Problem Relation Age of Onset   Stroke Mother 45   Heart disease Father 22   Heart attack Father    Colon cancer Maternal Aunt    Stomach cancer Neg Hx    Esophageal cancer Neg Hx      ROS:  Constitutional: see HPI  Eyes: Denied vision change or loss Ears/Nose/Mouth/Throat: Denied ear ache, sore throat, coughing, sinus pain Cardiovascular:see HPI  Respiratory: see HPI  Gastrointestinal: Denied nausea, vomiting, abdominal pain, diarrhea Genital/Urinary: Denied dysuria, hematuria, urinary frequency/urgency Musculoskeletal: Denied muscle ache, joint pain, weakness Skin: Denied rash, wound Neuro: Denied headache, dizziness, syncope Psych: Denied history of depression/anxiety  Endocrine: Denied history of diabetes   Physical Exam/Data:   Vitals:   02/21/23 2050 02/22/23 0024 02/22/23 0423 02/22/23 0500  BP: 121/66 117/71 129/69   Pulse: 60 (!) 53 (!) 54   Resp: 20 16 15    Temp: 97.8 F (36.6 C) 97.7 F (36.5 C) 97.6 F (36.4 C)   TempSrc: Oral Oral    SpO2: 99% 97% 99%   Weight:    113.3 kg    Intake/Output Summary (Last 24 hours) at 02/22/2023 0753 Last data filed at 02/22/2023 0550 Gross per 24 hour  Intake 240 ml  Output 500 ml  Net -260 ml      02/22/2023    5:00 AM 02/21/2023   10:04 AM 02/21/2023    8:54 AM  Last 3 Weights  Weight (lbs) 249 lb 12.5 oz 260 lb 260 lb  Weight (kg) 113.3 kg 117.935 kg 117.935 kg     Body mass index is 33.88 kg/m.   Vitals:  Vitals:   02/22/23 0024 02/22/23 0423  BP: 117/71 129/69  Pulse: (!) 53 (!) 54  Resp: 16 15  Temp: 97.7 F (36.5 C) 97.6 F (36.4 C)  SpO2: 97% 99%   General Appearance: In no  apparent distress, laying in bed, well nourished  HEENT: Normocephalic, atraumatic.  Neck: Supple, trachea midline, elevated JVD to upper neck with HOB 30 degree  Cardiovascular: Regular rhythm, occasional skipped beat, bradycardiac, S1S2, grade III systolic murmur at RUSB  Respiratory: Resting breathing unlabored, lungs sounds clear to auscultation bilaterally, no use of accessory muscles. On room air.  Speaks full sentence.  Gastrointestinal: Bowel sounds positive, abdomen soft, non-tender, obese  Extremities: Able to move all extremities in bed without difficulty, trace edema of BLE  Musculoskeletal: Normal muscle bulk and tone, muscle strength 5/5 throughout, no limited range of motion, no swollen or erythematous joints Skin: Intact, warm, dry. No rashes or petechiae noted in exposed areas.  Neurologic: Alert, oriented to person, place and time. Fluent speech, no facial droop, no cognitive deficit, no pronator drift, no gross sensory deficit, no focal muscle weakness, no gross focal neuro deficit Psychiatric: Normal affect. Mood is appropriate.  External device: Foley/CVL/Port/Dialysis catheter       EKG:  The EKG was personally reviewed and demonstrates:    EKG 02/21/23 showed sinus rhythm 60bpm, first degree AVB, <2 second pause, manual Qtc , overall similar appearance to previous EKG.   Telemetry:  Telemetry was personally reviewed and demonstrates:    Sinus bradycardia 30-60bpm, intermittent junctional bradycardia, first degree AVB,  sinus pause up to 2.46s, non-conducted P throughout raising suspicion for  2nd AVB type II, occasional PVC and PACs. Further review per Dr Bjorn Pippin     Relevant CV Studies:   Echo 02/13/21:  1. Left ventricular ejection fraction, by estimation, is 55 to 60%. The  left ventricle has normal function. The left ventricle has no regional  wall motion abnormalities. There is mild left ventricular hypertrophy.  Left ventricular diastolic  parameters  are indeterminate.   2. Right ventricular systolic function is normal. The right ventricular  size is normal. There is normal pulmonary artery systolic pressure. The  estimated right ventricular systolic pressure is 25.3 mmHg.   3. Left atrial size was mildly dilated.   4. The mitral valve is grossly normal. Mild mitral valve regurgitation.   5. The aortic valve has been repaired/replaced. Aortic valve  regurgitation is not visualized. There is a 27 mm Magna-ease pericardial  valve present in the aortic position. Procedure Date: 12/11/2009. Echo  findings are consistent with normal structure and   function of the aortic valve prosthesis.   6. Aortic root/ascending aorta has been repaired/replaced and with 30 mm  Hemashield straight graft with hemi-arch reconstruction. Normal appearance  of ascending aorta replacement.   Laboratory Data:  High Sensitivity Troponin:   Recent Labs  Lab 02/21/23 1020 02/21/23 1240  TROPONINIHS 9 8     Chemistry Recent Labs  Lab 02/21/23 1020 02/22/23 0436  NA 138 138  K 4.1 4.1  CL 104 106  CO2 24 22  GLUCOSE 107* 100*  BUN 22 25*  CREATININE 1.18 1.12  CALCIUM 9.4 9.2  MG  --  2.3  GFRNONAA >60 >60  ANIONGAP 10 10    Recent Labs  Lab 02/21/23 1020 02/22/23 0436  PROT 7.5 7.0  ALBUMIN 4.5 4.0  AST 19 20  ALT 13 16  ALKPHOS 63 53  BILITOT 2.3* 2.4*   Lipids No results for input(s): "CHOL", "TRIG", "HDL", "LABVLDL", "LDLCALC", "CHOLHDL" in the last 168 hours.  Hematology Recent Labs  Lab 02/21/23 1020 02/22/23 0436  WBC 5.9 5.6  RBC 4.66 4.40  HGB 14.6 13.5  HCT 41.7 41.2  MCV 89.5 93.6  MCH 31.3 30.7  MCHC 35.0 32.8  RDW 12.6 12.4  PLT 170 155   Thyroid  Recent Labs  Lab 02/22/23 0436  TSH 2.243    BNP Recent Labs  Lab 02/21/23 1020  BNP 122.6*    DDimer  Recent Labs  Lab 02/21/23 1020  DDIMER 2.44*     Radiology/Studies:  US Venous Img Lower Unilateral Right  Result Date:  02/21/2023 CLINICAL DATA:  Right lower extremity pain and edema for the past 2 weeks. Shortness of breath with exertion. Evaluate for DVT. EXAM: RIGHT LOWER EXTREMITY VENOUS DOPPLER ULTRASOUND TECHNIQUE: Gray-scale sonography with graded compression, as well as color Doppler and duplex ultrasound were performed to evaluate the lower extremity deep venous systems from the level of the common femoral vein and including the common femoral, femoral, profunda femoral, popliteal and calf veins including the posterior tibial, peroneal and gastrocnemius veins when visible. The superficial great saphenous vein was also interrogated. Spectral Doppler was utilized to evaluate flow at rest and with distal augmentation maneuvers in the common femoral, femoral and popliteal veins. COMPARISON:  None Available. FINDINGS: Contralateral Common Femoral Vein: Respiratory phasicity is normal and symmetric with the symptomatic side. No evidence of thrombus. Normal compressibility. Common Femoral Vein: No evidence of thrombus. Normal compressibility, respiratory phasicity and response to augmentation. Saphenofemoral Junction: No evidence of thrombus. Normal compressibility and flow  on color Doppler imaging. Profunda Femoral Vein: No evidence of thrombus. Normal compressibility and flow on color Doppler imaging. Femoral Vein: No evidence of thrombus. Normal compressibility, respiratory phasicity and response to augmentation. Popliteal Vein: No evidence of thrombus. Normal compressibility, respiratory phasicity and response to augmentation. Calf Veins: No evidence of thrombus. Normal compressibility and flow on color Doppler imaging. Superficial Great Saphenous Vein: No evidence of thrombus. Normal compressibility. Other Findings:  None. IMPRESSION: No evidence of DVT within the right lower extremity. Electronically Signed   By: Simonne Come M.D.   On: 02/21/2023 11:42   DG Chest 2 View  Result Date: 02/21/2023 CLINICAL DATA:  Shortness  of breath. EXAM: CHEST - 2 VIEW COMPARISON:  04/17/2022. FINDINGS: Bilateral lung fields are clear. Bilateral costophrenic angles are clear. Stable cardio-mediastinal silhouette. Median sternotomy noted. No acute osseous abnormalities. The soft tissues are within normal limits. IMPRESSION: *No active cardiopulmonary disease. Electronically Signed   By: Jules Schick M.D.   On: 02/21/2023 10:40     Assessment and Plan:   Symptomatic bradycardia  - presented with increased SOB wit exertion, fatigue, weakness, LE edema for 2 weeks - Hs trop 9 >8, not consistent with ACS - BNP 122, CXR no acute finding, POA - EKG and telemetry revealing sinus bradycardia 30-60s, intermittent junctional bradycardia, first degree AVB, intermittently non-conducted P suspecting for 2nd degree AVB type II, sinus pause longest 2.46s, QT not prolonged  - TSH WNL, not on rating slowing agents, no significant infection or metabolic derangement noted so far  - Echo is pending to evaluate for aortic valve disease and CHF - will review with Dr Bjorn Pippin and EP team regarding potential PPM implant, plan to transfer to [facility], communicated to primary team  - PRN Atropine if unstable bradycardia   Acute HFpEF Hx of 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  Agree with above documentation.   Jeff Johnson is a 77 year old male with a history of ascending aortic aneurysm with aortic insufficiency status post Bentall in 2011, hypertension who presents with shortness of breath.  States that he has been having worsening shortness of breath also noting lightheadedness and fatigue.  He has noted his heart rate as low as 30s at home.  On presentation to ED, BP 151/39, pulse 59, SpO2 99% on room air.  Labs notable for creatinine 1.18, BNP 123, hemoglobin 14.6, platelets 170, chest x-ray unremarkable.  He has been noted to have HR down to 30s.  On exam, patient is alert and oriented, regular rate and rhythm, 2/6 systolic murmur, lungs CTAB, no LE edema or JVD.  Telemetry shows sinus bradycardia with intermittent junctional rhythm with rate down to high 30s, sinus pauses up to 2.5 seconds. BP stable, though he reports feeling fatigued and lightheaded.  Suspect symptomatic bradycardia. Planning transfer to  [facility] for EP evaluation for pacemaker.  Little Ishikawa, MD

## 2023-02-22 NOTE — Plan of Care (Signed)

## 2023-02-23 DIAGNOSIS — R001 Bradycardia, unspecified: Secondary | ICD-10-CM | POA: Diagnosis not present

## 2023-02-23 DIAGNOSIS — I1A0 Resistant hypertension: Secondary | ICD-10-CM | POA: Diagnosis not present

## 2023-02-23 LAB — BASIC METABOLIC PANEL
Anion gap: 9 (ref 5–15)
BUN: 29 mg/dL — ABNORMAL HIGH (ref 8–23)
CO2: 23 mmol/L (ref 22–32)
Calcium: 9.3 mg/dL (ref 8.9–10.3)
Chloride: 102 mmol/L (ref 98–111)
Creatinine, Ser: 1.39 mg/dL — ABNORMAL HIGH (ref 0.61–1.24)
GFR, Estimated: 52 mL/min — ABNORMAL LOW (ref >60)
Glucose, Bld: 111 mg/dL — ABNORMAL HIGH (ref 70–99)
Potassium: 3.5 mmol/L (ref 3.5–5.1)
Sodium: 134 mmol/L — ABNORMAL LOW (ref 135–145)

## 2023-02-23 LAB — CBC
HCT: 41.2 % (ref 39.0–52.0)
Hemoglobin: 13.6 g/dL (ref 13.0–17.0)
MCH: 30.4 pg (ref 26.0–34.0)
MCHC: 33 g/dL (ref 30.0–36.0)
MCV: 92.2 fL (ref 80.0–100.0)
Platelets: 150 10*3/uL (ref 150–400)
RBC: 4.47 MIL/uL (ref 4.22–5.81)
RDW: 12.4 % (ref 11.5–15.5)
WBC: 5 10*3/uL (ref 4.0–10.5)
nRBC: 0 % (ref 0.0–0.2)

## 2023-02-23 MED ORDER — POTASSIUM CHLORIDE CRYS ER 20 MEQ PO TBCR
40.0000 meq | EXTENDED_RELEASE_TABLET | Freq: Once | ORAL | Status: AC
  Administered 2023-02-23: 40 meq via ORAL
  Filled 2023-02-23: qty 2

## 2023-02-23 NOTE — Plan of Care (Signed)

## 2023-02-23 NOTE — Progress Notes (Addendum)
Patient Name: Jeff Johnson Date of Encounter: 02/23/2023 [facility] Cardiologist: Jodelle Red, MD   Interval Summary  .    Patient reports feeling slightly better today compared with yesterday. He continues to have dyspnea with exertion/exertional intolerance. Reports dizziness only with rapid changes in body position.   Vital Signs .    Vitals:   02/22/23 2043 02/22/23 2357 02/23/23 0405 02/23/23 0535  BP: 102/67 139/64 126/74   Pulse: (!) 54 75 60   Resp:      Temp: 98.2 F (36.8 C)  97.8 F (36.6 C)   TempSrc: Oral  Oral   SpO2: 97% 97%    Weight:    108.9 kg    Intake/Output Summary (Last 24 hours) at 02/23/2023 0826 Last data filed at 02/23/2023 0425 Gross per 24 hour  Intake 10 ml  Output 975 ml  Net -965 ml      02/23/2023    5:35 AM 02/22/2023    5:00 AM 02/21/2023   10:04 AM  Last 3 Weights  Weight (lbs) 240 lb 1.6 oz 249 lb 12.5 oz 260 lb  Weight (kg) 108.909 kg 113.3 kg 117.935 kg      Telemetry/ECG    Sinus bradycardia/1st degree AVB. Intermittent junctional bradycardia. Frequent sinus pauses of ~2 seconds - Personally Reviewed  Physical Exam .   GEN: No acute distress.   Neck: No JVD Cardiac: Bradycardic, no rubs, or gallops. 2/6 systolic murmur RUSB Respiratory: Clear to auscultation bilaterally. GI: Soft, nontender, non-distended  MS: No edema  Assessment & Plan .     Jeff Johnson is a 77 y.o. 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   Symptomatic bradycardia   Patient presented with increased exertional dyspnea, fatigue, weakness, LE edema. Labs show troponin 9->8, BNP 122, CXR without acute findings. TSH within normal limits. EKG and telemetry show frequent bradycardia with first degree AVB, intermittent junctional rhythm with HR into  the 30s and sinus pauses up to 2.5 seconds.   Patient with stable exertional dyspnea/fatigue and dizziness upon position changes. Pending transfer to [facility] for EP evaluation of PPM.   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 TTE this admission with LVEF 55-60%, grade II DD with "LV diastolic function significantly worse" than prior. Echo does show normal structure and function of aortic valve prosthesis.   Patient euvolemic appearing on exam today. No plans for additional diuresis. Neg negative 1.5L following one time dose of Lasix.  Would recommend SGLT2 initiation prior to discharge. Suspect progressing DD 2/2 longstanding hypertension.   Resistant hypertension  Patient with hx hypertension. Has been on amlodipine, losartan, PRN hydralazine until this admission. Now not receiving anti-hypertensive agents but maintaining stable BP.   Given symptomatic bradycardia and overall controlled BP, continue to monitor BP closely without administration of home meds. Would preferentially resume Losartan over Amlodipine if patient develops recurrent hypertension.    For questions or updates, please contact [facility] Please consult [email or web address] for contact info under        Signed, Perlie Gold, PA-C   Patient seen and examined.  Agree with above documentation.  On exam, patient is alert and oriented, bradycardic, regular, 6 systolic murmur, lungs CTAB,  no LE edema or JVD.  Telemetry reviewed, continues to have bradycardia with junctional rhythm and 2:1 block down to 30s.  BP stable.  Awaiting transfer to [facility] for EP evaluation for PPM.  I spoke with EP to give a heads-up.  Little Ishikawa, MD

## 2023-02-23 NOTE — Progress Notes (Signed)
[facility] at bedside to transport patient. 

## 2023-02-23 NOTE — Progress Notes (Addendum)
PROGRESS NOTE    Jeff Johnson  WUJ:[id number] DOB: 05-20-1945 DOA: 02/21/2023 PCP: de [place], Raymond J, MD   Brief Narrative: 77 year old with past medical history significant for hypertension, severe aortic stenosis with a history of aortic valve replacement in 2011 presents complaining of shortness of breath worse over the last 2 weeks.  Evaluation chest x-ray no acute cardiopulmonary findings, lower extremity Doppler negative for DVT, BNP 122, patient found to have symptomatic bradycardia.  Awaiting transfer to [facility] on for pacemaker.    Assessment & Plan:   Principal Problem:   Symptomatic bradycardia Active Problems:   Shortness of breath   Hyperlipidemia   Resistant hypertension   Acute diastolic heart failure (HCC)   1-Symptomatic  Bradycardia History of A-fib Sinus pauses on telemetry Cardiology following Plan to transfer to [facility] for EP eval, and possible pacemaker TSH: 2.2 ECHO; normal function of aortic valve prothesis. 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 Further diuretics per cardiology   Hypertension: Holding amlodipine and losartan  Hyperlipidemia: Continue with the statins   Estimated body mass index is 32.56 kg/m as calculated from the following:   Height as of this encounter: 6' (1.829 m).   Weight as of this encounter: 108.9 kg.   DVT prophylaxis: Lovenox Code Status: Full code Family Communication: care discussed with patient.  Disposition Plan:  Status is: Observation The patient remains OBS appropriate and will d/c before 2 midnights.    Consultants:  Cardiology   Procedures:    Antimicrobials:    Subjective: He is alert, report dyspnea on exertion. If he is sitting he is fine.   Objective: Vitals:   02/23/23 0405 02/23/23 0535 02/23/23 0921  02/23/23 0935  BP: 126/74  132/78   Pulse: 60  69   Resp:      Temp: 97.8 F (36.6 C)  97.6 F (36.4 C)   TempSrc: Oral  Oral   SpO2:   98%   Weight:  108.9 kg    Height:    6' (1.829 m)    Intake/Output Summary (Last 24 hours) at 02/23/2023 1000 Last data filed at 02/23/2023 0930 Gross per 24 hour  Intake 3 ml  Output 1125 ml  Net -1122 ml   Filed Weights   02/21/23 1004 02/22/23 0500 02/23/23 0535  Weight: 117.9 kg 113.3 kg 108.9 kg    Examination:  General exam: Appears calm and comfortable  Respiratory system: Clear to auscultation. Respiratory effort normal. Cardiovascular system: S1 & S2 heard, RRR. No JVD, murmurs, rubs, gallops or clicks. No pedal edema. Gastrointestinal system: Abdomen is nondistended, soft and nontender. No organomegaly or masses felt. Normal bowel sounds heard. Central nervous system: Alert and oriented. No focal neurological deficits. Extremities: Symmetric 5 x 5 power.   Data Reviewed: I have personally reviewed following labs and imaging studies  CBC: Recent Labs  Lab 02/21/23 1020 02/22/23 0436 02/23/23 0454  WBC 5.9 5.6 5.0  HGB 14.6 13.5 13.6  HCT 41.7 41.2 41.2  MCV 89.5 93.6 92.2  PLT 170 155 150   Basic Metabolic Panel: Recent Labs  Lab 02/21/23 1020 02/22/23 0436 02/23/23 0454  NA 138 138 134*  K 4.1 4.1 3.5  CL 104 106 102  CO2 24 22 23   GLUCOSE 107* 100* 111*  BUN 22 25* 29*  CREATININE 1.18 1.12 1.39*  CALCIUM 9.4  9.2 9.3  MG  --  2.3  --    GFR: Estimated Creatinine Clearance: 54.9 mL/min (A) (by C-G formula based on SCr of 1.39 mg/dL (H)). Liver Function Tests: Recent Labs  Lab 02/21/23 1020 02/22/23 0436  AST 19 20  ALT 13 16  ALKPHOS 63 53  BILITOT 2.3* 2.4*  PROT 7.5 7.0  ALBUMIN 4.5 4.0   No results for input(s): "LIPASE", "AMYLASE" in the last 168 hours. No results for input(s): "AMMONIA" in the last 168 hours. Coagulation Profile: No results for input(s): "INR", "PROTIME" in the last 168  hours. Cardiac Enzymes: No results for input(s): "CKTOTAL", "CKMB", "CKMBINDEX", "TROPONINI" in the last 168 hours. BNP (last 3 results) No results for input(s): "PROBNP" in the last 8760 hours. HbA1C: No results for input(s): "HGBA1C" in the last 72 hours. CBG: No results for input(s): "GLUCAP" in the last 168 hours. Lipid Profile: No results for input(s): "CHOL", "HDL", "LDLCALC", "TRIG", "CHOLHDL", "LDLDIRECT" in the last 72 hours. Thyroid Function Tests: Recent Labs    02/22/23 0436  TSH 2.243   Anemia Panel: No results for input(s): "VITAMINB12", "FOLATE", "FERRITIN", "TIBC", "IRON", "RETICCTPCT" in the last 72 hours. Sepsis Labs: No results for input(s): "PROCALCITON", "LATICACIDVEN" in the last 168 hours.  No results found for this or any previous visit (from the past 240 hour(s)).       Radiology Studies: ECHOCARDIOGRAM COMPLETE  Result Date: 02/22/2023    ECHOCARDIOGRAM REPORT   Patient Name:   Jeff Johnson Date of Exam: 02/22/2023 Medical Rec #:  [id number]     Height:       72.0 in Accession #:    [id number]    Weight:       249.8 lb Date of Birth:  1945/12/10     BSA:          2.342 m Patient Age:    16 years      BP:           129/69 mmHg Patient Gender: M             HR:           55 bpm. Exam Location:  Inpatient Procedure: 2D Echo, Cardiac Doppler, Color Doppler and Intracardiac            Opacification Agent Indications:    DOE R06.00  History:        Patient has prior history of Echocardiogram examinations, most                 recent 02/13/2021. Aortic Valve Disease; Risk Factors:Former                 Smoker, Hypertension and Dyslipidemia.                 Aortic Valve: 27 mm Magna bioprosthetic valve is present in the                 aortic position. Procedure Date: 2011.  Sonographer:    Dondra Prader RVT RCS Referring Phys: [id number] TIMOTHY S OPYD  Sonographer Comments: Technically difficult study due to poor echo windows, suboptimal parasternal window, suboptimal  apical window and suboptimal subcostal window. Image acquisition challenging due to patient body habitus. IMPRESSIONS  1. Left ventricular ejection fraction, by estimation, is 55 to 60%. The left ventricle has normal function. The left ventricle has no regional wall motion abnormalities. There is mild concentric left ventricular hypertrophy. Left ventricular diastolic parameters are consistent with Grade  II diastolic dysfunction (pseudonormalization). Elevated left atrial pressure.  2. Right ventricular systolic function is normal. The right ventricular size is normal. There is mildly elevated pulmonary artery systolic pressure. The estimated right ventricular systolic pressure is 35.9 mmHg.  3. Left atrial size was mildly dilated.  4. The mitral valve is normal in structure. No evidence of mitral valve regurgitation. No evidence of mitral stenosis.  5. The aortic valve has been repaired/replaced. There is mild calcification of the aortic valve. There is mild thickening of the aortic valve. Aortic valve regurgitation is not visualized. There is a 27 mm Magna bioprosthetic valve present in the aortic  position. Procedure Date: 2011. Echo findings are consistent with normal structure and function of the aortic valve prosthesis. Aortic valve mean gradient measures 5.0 mmHg. Aortic valve Vmax measures 1.50 m/s.  6. Previous ascending aorta Bentall repair. Aortic dilatation noted. There is mild dilatation of the aortic root, measuring 40 mm. There is mild dilatation of the ascending aorta, measuring 41 mm. Comparison(s): Prior images reviewed side by side. The left ventricular diastolic function is significantly worse. FINDINGS  Left Ventricle: Left ventricular ejection fraction, by estimation, is 55 to 60%. The left ventricle has normal function. The left ventricle has no regional wall motion abnormalities. Definity contrast agent was given IV to delineate the left ventricular  endocardial borders. The left ventricular  internal cavity size was normal in size. There is mild concentric left ventricular hypertrophy. Left ventricular diastolic parameters are consistent with Grade II diastolic dysfunction (pseudonormalization). Elevated left atrial pressure. Right Ventricle: The right ventricular size is normal. Right vetricular wall thickness was not well visualized. Right ventricular systolic function is normal. There is mildly elevated pulmonary artery systolic pressure. The tricuspid regurgitant velocity  is 2.78 m/s, and with an assumed right atrial pressure of 5 mmHg, the estimated right ventricular systolic pressure is 35.9 mmHg. Left Atrium: Left atrial size was mildly dilated. Right Atrium: Right atrial size was normal in size. Pericardium: There is no evidence of pericardial effusion. Mitral Valve: The mitral valve is normal in structure. No evidence of mitral valve regurgitation. No evidence of mitral valve stenosis. Tricuspid Valve: The tricuspid valve is normal in structure. Tricuspid valve regurgitation is mild. Aortic Valve: The aortic valve has been repaired/replaced. There is mild calcification of the aortic valve. There is mild thickening of the aortic valve. Aortic valve regurgitation is not visualized. Aortic valve mean gradient measures 5.0 mmHg. Aortic valve peak gradient measures 9.0 mmHg. There is a 27 mm Magna bioprosthetic valve present in the aortic position. Procedure Date: 2011. Echo findings are consistent with normal structure and function of the aortic valve prosthesis. Pulmonic Valve: The pulmonic valve was grossly normal. Pulmonic valve regurgitation is mild. No evidence of pulmonic stenosis. Aorta: Previous ascending aorta Bentall repair. Aortic dilatation noted, the aortic root was not well visualized and the aortic root is normal in size and structure. There is mild dilatation of the aortic root, measuring 40 mm. There is mild dilatation of the ascending aorta, measuring 41 mm. Venous: The inferior  vena cava was not well visualized. IAS/Shunts: The interatrial septum was not well visualized.  LEFT VENTRICLE PLAX 2D LVIDd:         5.40 cm Diastology LVIDs:         4.60 cm LV e' medial:    6.53 cm/s LV PW:         1.40 cm LV E/e' medial:  15.5 LV IVS:  1.30 cm LV e' lateral:   7.00 cm/s                        LV E/e' lateral: 14.4  RIGHT VENTRICLE RV Basal diam:  4.00 cm LEFT ATRIUM             Index        RIGHT ATRIUM           Index LA diam:        3.80 cm 1.62 cm/m   RA Area:     16.90 cm LA Vol (A2C):   71.7 ml 30.62 ml/m  RA Volume:   46.30 ml  19.77 ml/m LA Vol (A4C):   50.3 ml 21.48 ml/m LA Biplane Vol: 64.4 ml 27.50 ml/m  AORTIC VALVE                   PULMONIC VALVE AV Vmax:           150.00 cm/s PV Vmax:          0.99 m/s AV Vmean:          99.900 cm/s PV Peak grad:     3.9 mmHg AV VTI:            0.336 m     PR End Diast Vel: 5.57 msec AV Peak Grad:      9.0 mmHg AV Mean Grad:      5.0 mmHg LVOT Vmax:         120.00 cm/s LVOT Vmean:        80.000 cm/s LVOT VTI:          0.266 m LVOT/AV VTI ratio: 0.79  AORTA Ao Root diam: 4.00 cm Ao Asc diam:  4.10 cm MITRAL VALVE                TRICUSPID VALVE MV Area (PHT): 3.37 cm     TR Peak grad:   30.9 mmHg MV Decel Time: 225 msec     TR Vmax:        278.00 cm/s MV E velocity: 101.00 cm/s MV A velocity: 76.50 cm/s   SHUNTS MV E/A ratio:  1.32         Systemic VTI: 0.27 m Rachelle Hora Croitoru MD Electronically signed by Thurmon Fair MD Signature Date/Time: 02/22/2023/3:23:51 PM    Final    US Venous Img Lower Unilateral Right  Result Date: 02/21/2023 CLINICAL DATA:  Right lower extremity pain and edema for the past 2 weeks. Shortness of breath with exertion. Evaluate for DVT. EXAM: RIGHT LOWER EXTREMITY VENOUS DOPPLER ULTRASOUND TECHNIQUE: Gray-scale sonography with graded compression, as well as color Doppler and duplex ultrasound were performed to evaluate the lower extremity deep venous systems from the level of the common femoral vein and  including the common femoral, femoral, profunda femoral, popliteal and calf veins including the posterior tibial, peroneal and gastrocnemius veins when visible. The superficial great saphenous vein was also interrogated. Spectral Doppler was utilized to evaluate flow at rest and with distal augmentation maneuvers in the common femoral, femoral and popliteal veins. COMPARISON:  None Available. FINDINGS: Contralateral Common Femoral Vein: Respiratory phasicity is normal and symmetric with the symptomatic side. No evidence of thrombus. Normal compressibility. Common Femoral Vein: No evidence of thrombus. Normal compressibility, respiratory phasicity and response to augmentation. Saphenofemoral Junction: No evidence of thrombus. Normal compressibility and flow on color Doppler imaging. Profunda Femoral Vein: No evidence of thrombus. Normal compressibility  and flow on color Doppler imaging. Femoral Vein: No evidence of thrombus. Normal compressibility, respiratory phasicity and response to augmentation. Popliteal Vein: No evidence of thrombus. Normal compressibility, respiratory phasicity and response to augmentation. Calf Veins: No evidence of thrombus. Normal compressibility and flow on color Doppler imaging. Superficial Great Saphenous Vein: No evidence of thrombus. Normal compressibility. Other Findings:  None. IMPRESSION: No evidence of DVT within the right lower extremity. Electronically Signed   By: Simonne Come M.D.   On: 02/21/2023 11:42   DG Chest 2 View  Result Date: 02/21/2023 CLINICAL DATA:  Shortness of breath. EXAM: CHEST - 2 VIEW COMPARISON:  04/17/2022. FINDINGS: Bilateral lung fields are clear. Bilateral costophrenic angles are clear. Stable cardio-mediastinal silhouette. Median sternotomy noted. No acute osseous abnormalities. The soft tissues are within normal limits. IMPRESSION: *No active cardiopulmonary disease. Electronically Signed   By: Jules Schick M.D.   On: 02/21/2023 10:40         Scheduled Meds:  aspirin EC  81 mg Oral Daily   enoxaparin (LOVENOX) injection  60 mg Subcutaneous Q24H   montelukast  10 mg Oral QHS   pantoprazole  20 mg Oral Daily   simvastatin  20 mg Oral Daily   sodium chloride flush  3 mL Intravenous Q12H   tamsulosin  0.4 mg Oral Daily   Continuous Infusions:   LOS: 0 days    Time spent: 35 minutes    Delsy Etzkorn A Jeaneen Cala, MD [facility]   If 7PM-7AM, please contact night-coverage [email or web address]  02/23/2023, 10:00 AM

## 2023-02-24 ENCOUNTER — Other Ambulatory Visit: Payer: Self-pay

## 2023-02-24 ENCOUNTER — Other Ambulatory Visit (HOSPITAL_BASED_OUTPATIENT_CLINIC_OR_DEPARTMENT_OTHER): Payer: Self-pay

## 2023-02-24 ENCOUNTER — Inpatient Hospital Stay (HOSPITAL_COMMUNITY)
Admission: EM | Disposition: A | Discharge status: Home / Self Care | Payer: Self-pay | Source: Home / Self Care | Attending: Internal Medicine

## 2023-02-24 DIAGNOSIS — I110 Hypertensive heart disease with heart failure: Secondary | ICD-10-CM | POA: Diagnosis present

## 2023-02-24 DIAGNOSIS — Z96642 Presence of left artificial hip joint: Secondary | ICD-10-CM | POA: Diagnosis present

## 2023-02-24 DIAGNOSIS — I2510 Atherosclerotic heart disease of native coronary artery without angina pectoris: Secondary | ICD-10-CM | POA: Diagnosis present

## 2023-02-24 DIAGNOSIS — Z953 Presence of xenogenic heart valve: Secondary | ICD-10-CM | POA: Diagnosis not present

## 2023-02-24 DIAGNOSIS — I10 Essential (primary) hypertension: Secondary | ICD-10-CM | POA: Diagnosis not present

## 2023-02-24 DIAGNOSIS — Z8546 Personal history of malignant neoplasm of prostate: Secondary | ICD-10-CM | POA: Diagnosis not present

## 2023-02-24 DIAGNOSIS — E782 Mixed hyperlipidemia: Secondary | ICD-10-CM | POA: Diagnosis present

## 2023-02-24 DIAGNOSIS — Z6831 Body mass index (BMI) 31.0-31.9, adult: Secondary | ICD-10-CM

## 2023-02-24 DIAGNOSIS — N179 Acute kidney failure, unspecified: Secondary | ICD-10-CM

## 2023-02-24 DIAGNOSIS — I5033 Acute on chronic diastolic (congestive) heart failure: Secondary | ICD-10-CM | POA: Diagnosis present

## 2023-02-24 DIAGNOSIS — R001 Bradycardia, unspecified: Secondary | ICD-10-CM | POA: Diagnosis present

## 2023-02-24 DIAGNOSIS — Z9049 Acquired absence of other specified parts of digestive tract: Secondary | ICD-10-CM | POA: Diagnosis not present

## 2023-02-24 DIAGNOSIS — Z923 Personal history of irradiation: Secondary | ICD-10-CM | POA: Diagnosis not present

## 2023-02-24 DIAGNOSIS — Z91013 Allergy to seafood: Secondary | ICD-10-CM | POA: Diagnosis not present

## 2023-02-24 DIAGNOSIS — N400 Enlarged prostate without lower urinary tract symptoms: Secondary | ICD-10-CM | POA: Diagnosis present

## 2023-02-24 DIAGNOSIS — E66811 Obesity, class 1: Secondary | ICD-10-CM

## 2023-02-24 DIAGNOSIS — Z7982 Long term (current) use of aspirin: Secondary | ICD-10-CM | POA: Diagnosis not present

## 2023-02-24 DIAGNOSIS — Z6832 Body mass index (BMI) 32.0-32.9, adult: Secondary | ICD-10-CM | POA: Diagnosis not present

## 2023-02-24 DIAGNOSIS — K219 Gastro-esophageal reflux disease without esophagitis: Secondary | ICD-10-CM | POA: Diagnosis present

## 2023-02-24 DIAGNOSIS — Z91041 Radiographic dye allergy status: Secondary | ICD-10-CM | POA: Diagnosis not present

## 2023-02-24 DIAGNOSIS — R0609 Other forms of dyspnea: Secondary | ICD-10-CM | POA: Diagnosis present

## 2023-02-24 DIAGNOSIS — E785 Hyperlipidemia, unspecified: Secondary | ICD-10-CM | POA: Diagnosis not present

## 2023-02-24 DIAGNOSIS — Z6835 Body mass index (BMI) 35.0-35.9, adult: Secondary | ICD-10-CM | POA: Diagnosis not present

## 2023-02-24 DIAGNOSIS — I441 Atrioventricular block, second degree: Secondary | ICD-10-CM

## 2023-02-24 DIAGNOSIS — Z8249 Family history of ischemic heart disease and other diseases of the circulatory system: Secondary | ICD-10-CM | POA: Diagnosis not present

## 2023-02-24 DIAGNOSIS — Z87891 Personal history of nicotine dependence: Secondary | ICD-10-CM | POA: Diagnosis not present

## 2023-02-24 DIAGNOSIS — Z79899 Other long term (current) drug therapy: Secondary | ICD-10-CM | POA: Diagnosis not present

## 2023-02-24 DIAGNOSIS — I1A0 Resistant hypertension: Secondary | ICD-10-CM | POA: Diagnosis present

## 2023-02-24 DIAGNOSIS — E669 Obesity, unspecified: Secondary | ICD-10-CM | POA: Diagnosis present

## 2023-02-24 HISTORY — PX: PACEMAKER IMPLANT: EP1218

## 2023-02-24 LAB — BASIC METABOLIC PANEL
Anion gap: 7 (ref 5–15)
BUN: 28 mg/dL — ABNORMAL HIGH (ref 8–23)
CO2: 23 mmol/L (ref 22–32)
Calcium: 9.3 mg/dL (ref 8.9–10.3)
Chloride: 108 mmol/L (ref 98–111)
Creatinine, Ser: 1.24 mg/dL (ref 0.61–1.24)
GFR, Estimated: 59 mL/min — ABNORMAL LOW (ref >60)
Glucose, Bld: 103 mg/dL — ABNORMAL HIGH (ref 70–99)
Potassium: 4 mmol/L (ref 3.5–5.1)
Sodium: 138 mmol/L (ref 135–145)

## 2023-02-24 LAB — SURGICAL PCR SCREEN
MRSA, PCR: NEGATIVE
Staphylococcus aureus: NEGATIVE

## 2023-02-24 LAB — CBC
HCT: 39.7 % (ref 39.0–52.0)
Hemoglobin: 13.2 g/dL (ref 13.0–17.0)
MCH: 29.8 pg (ref 26.0–34.0)
MCHC: 33.2 g/dL (ref 30.0–36.0)
MCV: 89.6 fL (ref 80.0–100.0)
Platelets: 159 10*3/uL (ref 150–400)
RBC: 4.43 MIL/uL (ref 4.22–5.81)
RDW: 12.4 % (ref 11.5–15.5)
WBC: 6.9 10*3/uL (ref 4.0–10.5)
nRBC: 0 % (ref 0.0–0.2)

## 2023-02-24 SURGERY — PACEMAKER IMPLANT

## 2023-02-24 MED ORDER — MIDAZOLAM HCL 2 MG/2ML IJ SOLN
INTRAMUSCULAR | Status: AC
  Filled 2023-02-24: qty 2

## 2023-02-24 MED ORDER — FENTANYL CITRATE (PF) 100 MCG/2ML IJ SOLN
INTRAMUSCULAR | Status: DC | PRN
  Administered 2023-02-24 (×2): 50 ug via INTRAVENOUS

## 2023-02-24 MED ORDER — CEFAZOLIN SODIUM-DEXTROSE 1-4 GM/50ML-% IV SOLN
1.0000 g | Freq: Four times a day (QID) | INTRAVENOUS | Status: AC
  Administered 2023-02-24 – 2023-02-25 (×3): 1 g via INTRAVENOUS
  Filled 2023-02-24 (×3): qty 50

## 2023-02-24 MED ORDER — CHLORHEXIDINE GLUCONATE 4 % EX SOLN
60.0000 mL | Freq: Once | CUTANEOUS | Status: DC
  Filled 2023-02-24: qty 60

## 2023-02-24 MED ORDER — HEPARIN (PORCINE) IN NACL 1000-0.9 UT/500ML-% IV SOLN
INTRAVENOUS | Status: DC | PRN
  Administered 2023-02-24: 500 mL

## 2023-02-24 MED ORDER — LIDOCAINE HCL (PF) 1 % IJ SOLN
INTRAMUSCULAR | Status: DC | PRN
  Administered 2023-02-24: 60 mL

## 2023-02-24 MED ORDER — FENTANYL CITRATE (PF) 100 MCG/2ML IJ SOLN
INTRAMUSCULAR | Status: AC
  Filled 2023-02-24: qty 2

## 2023-02-24 MED ORDER — MIDAZOLAM HCL 5 MG/5ML IJ SOLN
INTRAMUSCULAR | Status: DC | PRN
  Administered 2023-02-24 (×2): 1 mg via INTRAVENOUS

## 2023-02-24 MED ORDER — SODIUM CHLORIDE 0.9 % IV SOLN
80.0000 mg | INTRAVENOUS | Status: AC
  Administered 2023-02-24: 80 mg
  Filled 2023-02-24: qty 2

## 2023-02-24 MED ORDER — CEFAZOLIN SODIUM-DEXTROSE 2-4 GM/100ML-% IV SOLN
INTRAVENOUS | Status: AC
  Administered 2023-02-24: 2 g
  Filled 2023-02-24: qty 100

## 2023-02-24 MED ORDER — LIDOCAINE HCL (PF) 1 % IJ SOLN
INTRAMUSCULAR | Status: AC
  Filled 2023-02-24: qty 60

## 2023-02-24 MED ORDER — CEFAZOLIN SODIUM-DEXTROSE 2-4 GM/100ML-% IV SOLN
2.0000 g | INTRAVENOUS | Status: DC
  Filled 2023-02-24: qty 100

## 2023-02-24 MED ORDER — SODIUM CHLORIDE 0.9 % IV SOLN
INTRAVENOUS | Status: AC
  Filled 2023-02-24: qty 2

## 2023-02-24 MED ORDER — ONDANSETRON HCL 4 MG/2ML IJ SOLN: 4.0000 mg | Freq: Four times a day (QID) | INTRAMUSCULAR | Status: DC | PRN

## 2023-02-24 MED ORDER — SODIUM CHLORIDE 0.9 % IV SOLN: INTRAVENOUS | Status: DC

## 2023-02-24 MED ORDER — CHLORHEXIDINE GLUCONATE 4 % EX SOLN
60.0000 mL | Freq: Once | CUTANEOUS | Status: AC
  Administered 2023-02-24: 4 via TOPICAL

## 2023-02-24 SURGICAL SUPPLY — 14 items
CABLE SURGICAL S-101-97-12 (CABLE) ×2 IMPLANT
CATH CPS LOCATOR 3D MED (CATHETERS) IMPLANT
CATH CPS LOCATOR 3D MED XLNG (CATHETERS) IMPLANT
HELIX LOCKING TOOL (MISCELLANEOUS) ×1
LEAD ULTIPACE 52 LPA1231/52 (Lead) IMPLANT
LEAD ULTIPACE 65 LPA1231/65 (Lead) IMPLANT
PACEMAKER ASSURITY DR-RF (Pacemaker) IMPLANT
PAD DEFIB RADIO PHYSIO CONN (PAD) ×2 IMPLANT
SHEATH 7FR PRELUDE SNAP 13 (SHEATH) IMPLANT
SHEATH 9FR PRELUDE SNAP 13 (SHEATH) IMPLANT
SLITTER AGILIS HISPRO (INSTRUMENTS) IMPLANT
TOOL HELIX LOCKING (MISCELLANEOUS) IMPLANT
TRAY PACEMAKER INSERTION (PACKS) ×2 IMPLANT
WIRE HI TORQ VERSACORE-J 145CM (WIRE) IMPLANT

## 2023-02-24 NOTE — Assessment & Plan Note (Addendum)
Continue taking simvastatin

## 2023-02-24 NOTE — Assessment & Plan Note (Signed)
Calculated BMI is 32.5  

## 2023-02-24 NOTE — Assessment & Plan Note (Addendum)
Echocardiogram with preserved LV systolic function with EF 55 to 60%, mild LVH, RV systolic function preserved. RVSP 35.9 mmHg. LA with mild dilatation.   Patient had furosemide for diuresis, negative volume status was achieved with significant improvement in his symptoms. He lost about 9 Kg during this hospitalization.   Plan to continue with losartan for blood pressure control.  Follow up as outpatient.

## 2023-02-24 NOTE — Interval H&P Note (Signed)
History and Physical Interval Note:  02/24/2023 3:03 PM  Jeff Johnson  has presented today for surgery, with the diagnosis of heart block.  The various methods of treatment have been discussed with the patient and family. After consideration of risks, benefits and other options for treatment, the patient has consented to  Procedure(s): PACEMAKER IMPLANT (N/A) as a surgical intervention.  The patient's history has been reviewed, patient examined, no change in status, stable for surgery.  I have reviewed the patient's chart and labs.  Questions were answered to the patient's satisfaction.     Nobie Putnam

## 2023-02-24 NOTE — Plan of Care (Signed)
  Problem: Education: Goal: Knowledge of General Education information will improve Description: Including pain rating scale, medication(s)/side effects and non-pharmacologic comfort measures Outcome: Progressing   Problem: Health Behavior/Discharge Planning: Goal: Ability to manage health-related needs will improve Outcome: Progressing   Problem: Clinical Measurements: Goal: Ability to maintain clinical measurements within normal limits will improve Outcome: Progressing Goal: Will remain free from infection Outcome: Progressing Goal: Diagnostic test results will improve Outcome: Progressing Goal: Respiratory complications will improve Outcome: Progressing Goal: Cardiovascular complication will be avoided Outcome: Progressing   Problem: Activity: Goal: Risk for activity intolerance will decrease Outcome: Progressing   Problem: Nutrition: Goal: Adequate nutrition will be maintained Outcome: Progressing   Problem: Coping: Goal: Level of anxiety will decrease Outcome: Progressing   Problem: Nutrition: Goal: Adequate nutrition will be maintained Outcome: Progressing   Problem: Coping: Goal: Level of anxiety will decrease Outcome: Progressing   Problem: Elimination: Goal: Will not experience complications related to bowel motility Outcome: Progressing Goal: Will not experience complications related to urinary retention Outcome: Progressing   Problem: Pain Management: Goal: General experience of comfort will improve Outcome: Progressing

## 2023-02-24 NOTE — Progress Notes (Signed)
Heart Failure Navigator Progress Note  Assessed for Heart & Vascular TOC clinic readiness.  Patient does not meet criteria due to EF 55-60%, has a scheduled [facility] appointment on 03/28/23, No HF TOC per Dr. Ella Jubilee. .   Navigator will sign off at this time.   Rhae Hammock, BSN, Scientist, clinical (histocompatibility and immunogenetics) Only

## 2023-02-24 NOTE — Assessment & Plan Note (Addendum)
Patient will resume losartan for blood pressure control.  Hold on amlodipine and hydralazine for now.  Follow up as outpatient.

## 2023-02-24 NOTE — Progress Notes (Signed)
   Patient Name: Jeff Johnson Date of Encounter: 02/24/2023 [facility] Cardiologist: Jodelle Red, MD   Interval Summary  .    Denies any chest pain.  Reports continues to have lightheadedness and shortness of breath when he stands and walks  Vital Signs .    Vitals:   02/23/23 1937 02/23/23 2318 02/24/23 0311 02/24/23 0731  BP: 133/65 137/69 131/70 120/73  Pulse: 60 66 (!) 55 (!) 50  Resp: 18 16 18 18   Temp: 97.9 F (36.6 C) 97.7 F (36.5 C) 97.8 F (36.6 C) 97.7 F (36.5 C)  TempSrc: Oral Oral Oral Oral  SpO2: 97% 96% 98%   Weight:      Height:        Intake/Output Summary (Last 24 hours) at 02/24/2023 0840 Last data filed at 02/23/2023 1649 Gross per 24 hour  Intake 363 ml  Output 150 ml  Net 213 ml      02/23/2023    5:35 AM 02/22/2023    5:00 AM 02/21/2023   10:04 AM  Last 3 Weights  Weight (lbs) 240 lb 1.6 oz 249 lb 12.5 oz 260 lb  Weight (kg) 108.909 kg 113.3 kg 117.935 kg      Telemetry/ECG    Sinus bradycardia/1st degree AVB. Intermittent 2:1 block with rates down to 30s. - Personally Reviewed  Physical Exam .   GEN: No acute distress.   Neck: No JVD Cardiac: Bradycardic, no rubs, or gallops. 2/6 systolic murmur RUSB Respiratory: Clear to auscultation bilaterally. GI: Soft, nontender, non-distended  MS: No edema  Assessment & Plan .     Jeff Johnson is a 77 y.o. 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   Symptomatic bradycardia   Patient presented with increased exertional dyspnea, fatigue, weakness, LE edema. Labs show troponin 9->8, BNP 122, CXR without acute findings. TSH within normal limits. EKG and telemetry show frequent bradycardia with first degree AVB, intermittent junctional rhythm and 2:1 block with HR into the 30s and sinus pauses  up to 2.5 seconds.   Patient with stable exertional dyspnea/fatigue and dizziness. EP consulted for PPM evaluation.   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 TTE this admission with LVEF 55-60%, grade II DD with "LV diastolic function significantly worse" than prior. Echo does show normal structure and function of aortic valve prosthesis.   Patient euvolemic appearing on exam today. No plans for additional diuresis. Neg negative 1.5L following one time dose of Lasix.  Would recommend SGLT2 initiation prior to discharge. Suspect progressing DD 2/2 longstanding hypertension.   Resistant hypertension  Patient with hx hypertension. Has been on amlodipine, losartan, PRN hydralazine until this admission. Now not receiving anti-hypertensive agents but maintaining stable BP.   Given symptomatic bradycardia and overall controlled BP, continue to monitor BP closely without administration of home meds. Would preferentially resume Losartan over Amlodipine if patient develops recurrent hypertension.    For questions or updates, please contact [facility] Please consult [email or web address] for contact info under        Signed, Little Ishikawa, MD

## 2023-02-24 NOTE — Consult Note (Signed)
ELECTROPHYSIOLOGY CONSULT NOTE    Patient ID: Jeff Johnson MRN: [id number], DOB/AGE: [birth year] 77 y.o.  Admit date: 02/21/2023 Date of Consult: 02/24/2023  Primary Physician: de [place], Buren Kos, MD Primary Cardiologist: Jodelle Red, MD  Electrophysiologist: new to Dr. Jimmey Ralph     Patient Profile: Jeff Johnson is a 77 y.o. 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 Ella Jubilee.  HPI:  Jeff Johnson is a 77 y.o. male with PMH as above.  He has had chronic SOB since his heart surgery in 2011. He noticed his DOE increased at end of November, was also having generalized malaise. During this time he also noticed increased RLE swelling and more labile BP. He began monitoring his HR and BP more closely at home and noticed his HR was in the 40-60s and sometimes dropping to 30s, previously was always in 60s or higher. He saw his PCP 12/9 for SOB complaints  with R leg/calf swelling and pain and was sent to [facility] ER for further evaluation.  While in ER, BNP slightly elevated to 122, CMP and CBC grossly unremarkable. Trop 9 > 8, thyroid labs normal. RLE doppler negative for DVT. His tele revealed intermittent junctional bradycardia and so he was transferred to [facility] for EP evaluation for ppm.   Today, he says that he is unable to walk any significant distance without feeling like he is going to give out. He denies chest pain, chest pressure, palpitations. RLE edema has completely resolved.   He works as a Electrical engineer, and often has to interact with mentally unstable and drunk people.    Labs Potassium4.0 (12/12 0246) Magnesium  2.3 (12/10 0436) Creatinine, ser  1.24 (12/12 0246) PLT  159 (12/12 0246) HGB  13.2 (12/12 0246) WBC 6.9 (12/12 0246) Troponin I (High Sensitivity)8 (12/09 1240).    Past Medical History:  Diagnosis  Date   Arthritis    Benign essential HTN 05/21/2014   BPH (benign prostatic hyperplasia)    Chronic cystitis    a. With ongoing hematuria.   Contrast media allergy    Diverticulitis    a. 2010: diverticulitis with stricture Sigmoid colectomy with mobilization of the splenic flexure within the end anastomosis.    Dizziness and giddiness    a. H/o dizziness with reportedly negative tilt table. Holter 2012: NSR, sinus tachy, frequent PVCs, multifocal at times in a trigeminal fashion.   Dyspnea on exertion 03/18/2014   Frequency of urination    GERD (gastroesophageal reflux disease)    H/O cardiac catheterization    a. 2011: minor luminal irregularities. b. Nuc 03/2014: ow risk without reversible ischemia or infarction, EF 57%. (There is septal akinesis but otherwise normal wall motion. This is nonspecific.)   Headache    MIGRAINES   Hematuria    History of urinary retention    Hyperlipidemia 03/18/2014   Hyponatremia    a. Remote hx hyponatremia felt 2/2 SSRI.   Nocturia    Obesity    Orthostatic hypotension    Prostate cancer (HCC) 2011   a. s/p radiation.   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 11/2009: pericardial AVR/Bentall.   SYNCOPE 02/18/2010   Qualifier: Diagnosis of  By: Ladona Ridgel, MD, Jerrell Mylar  Thoracic aortic aneurysm (HCC)    a. 11/2009: s/p Magna Ease pericardial tissue valve size 27mm and replacement of fusiform ascending thoracic aneurysm with 30-mm Hemashield cath with hemiarch reconstruction.     Surgical History:  Past Surgical History:  Procedure Laterality Date   BACK SURGERY  1985   CARPAL TUNNEL RELEASE     BIL WRISTS   CHOLECYSTECTOMY  2010   COLON SURGERY  2010   colon resection   COLONOSCOPY  2008   [facility]   COLONOSCOPY WITH PROPOFOL N/A 09/02/2017   Procedure: COLONOSCOPY WITH PROPOFOL;  Surgeon: Sherrilyn Rist, MD;  Location: [facility];  Service: Gastroenterology;  Laterality:  N/A;   doppler carotid  2012   DOPPLER ECHOCARDIOGRAPHY  2011   POLYPECTOMY  09/02/2017   Procedure: POLYPECTOMY;  Surgeon: Sherrilyn Rist, MD;  Location: [facility];  Service: Gastroenterology;;   PROSTATE SURGERY  2011   adenocarcinoma w/raddiation    ROTATOR CUFF REPAIR     X2 LEFT SHOULDER   THORACIC AORTIC ANEURYSM REPAIR  12/11/09   ASCENDING THORACIC AORTIC ANEURYSM REPAIR   TISSUE AORTIC VALVE REPLACEMENT  12/11/09   TOTAL HIP ARTHROPLASTY Left 04/17/2015   Procedure: LEFT TOTAL HIP ARTHROPLASTY ANTERIOR APPROACH;  Surgeon: Samson Frederic, MD;  Location: [facility];  Service: Orthopedics;  Laterality: Left;     Medications Prior to Admission  Medication Sig Dispense Refill Last Dose/Taking   acetaminophen (TYLENOL) 650 MG CR tablet Take 1,300 mg by mouth 3 (three) times daily as needed for pain or fever.   02/21/2023   amLODipine (NORVASC) 10 MG tablet Take 1 tablet (10 mg total) by mouth daily. (Patient taking differently: Take 10 mg by mouth at bedtime.) 90 tablet 1 02/20/2023   ASPIRIN 81 PO Take 1 tablet by mouth daily.   02/21/2023   Cholecalciferol (VITAMIN D PO) Take 1 tablet by mouth daily.   02/21/2023   hydrALAZINE (APRESOLINE) 25 MG tablet Take 1 tablet (25 mg total) by mouth 3 (three) times daily as needed (blood pressure more than 160 on the top number). 90 tablet 0 02/21/2023   lansoprazole (PREVACID) 15 MG capsule Take 1 capsule (15 mg total) by mouth daily at 12 noon. 30 capsule 3 02/21/2023   losartan (COZAAR) 50 MG tablet Take 1 tablet (50 mg total) by mouth daily. 90 tablet 1 02/21/2023   montelukast (SINGULAIR) 10 MG tablet Take 1 tablet (10 mg total) by mouth daily. 30 tablet 3 02/21/2023   simvastatin (ZOCOR) 20 MG tablet Take 1 tablet (20 mg total) by mouth daily. 30 tablet 3 02/21/2023   tamsulosin (FLOMAX) 0.4 MG CAPS capsule Take 1 capsule (0.4 mg total) by mouth daily. 30 capsule 6 02/21/2023    Inpatient Medications:   aspirin EC  81 mg Oral Daily   enoxaparin  (LOVENOX) injection  60 mg Subcutaneous Q24H   montelukast  10 mg Oral QHS   pantoprazole  20 mg Oral Daily   simvastatin  20 mg Oral Daily   sodium chloride flush  3 mL Intravenous Q12H   tamsulosin  0.4 mg Oral Daily    Allergies:  Allergies  Allergen Reactions   Beta Adrenergic Blockers Other (See Comments)    Remote reportedh/o intolerance due to weakness per notes.Marland KitchenMarland KitchenMarland KitchenCalled patient. Advised patient of provider's approval for requested procedure, as well as any comments/instructions from provider.    Iodinated Contrast Media Anaphylaxis   Shellfish Allergy Anaphylaxis and Other (See Comments)   Shellfish-Derived Products Anaphylaxis  Family History  Problem Relation Age of Onset   Stroke Mother 57   Heart disease Father 43   Heart attack Father    Colon cancer Maternal Aunt    Stomach cancer Neg Hx    Esophageal cancer Neg Hx      Physical Exam: Vitals:   02/23/23 1937 02/23/23 2318 02/24/23 0311 02/24/23 0731  BP: 133/65 137/69 131/70 120/73  Pulse: 60 66 (!) 55 (!) 50  Resp: 18 16 18 18   Temp: 97.9 F (36.6 C) 97.7 F (36.5 C) 97.8 F (36.6 C) 97.7 F (36.5 C)  TempSrc: Oral Oral Oral Oral  SpO2: 97% 96% 98%   Weight:      Height:        GEN- NAD, A&O x 3, normal affect HEENT: Normocephalic, atraumatic Lungs- CTAB, Normal effort.  Heart- Irregular, slightly bradycardic rate and rhythm, 2/6 murmur at RSB  GI- Soft, NT, ND.  Extremities- No clubbing, cyanosis, or edema   Radiology/Studies: ECHOCARDIOGRAM COMPLETE Result Date: 02/22/2023    ECHOCARDIOGRAM REPORT   Patient Name:   Jeff Johnson Date of Exam: 02/22/2023 Medical Rec #:  [id number]     Height:       72.0 in Accession #:    [id number]    Weight:       249.8 lb Date of Birth:  Mar 07, 1946     BSA:          2.342 m Patient Age:    75 years      BP:           129/69 mmHg Patient Gender: M             HR:           55 bpm. Exam Location:  Inpatient Procedure: 2D Echo, Cardiac Doppler, Color Doppler and  Intracardiac            Opacification Agent Indications:    DOE R06.00  History:        Patient has prior history of Echocardiogram examinations, most                 recent 02/13/2021. Aortic Valve Disease; Risk Factors:Former                 Smoker, Hypertension and Dyslipidemia.                 Aortic Valve: 27 mm Magna bioprosthetic valve is present in the                 aortic position. Procedure Date: 2011.  Sonographer:    Dondra Prader RVT RCS Referring Phys: [id number] TIMOTHY S OPYD  Sonographer Comments: Technically difficult study due to poor echo windows, suboptimal parasternal window, suboptimal apical window and suboptimal subcostal window. Image acquisition challenging due to patient body habitus. IMPRESSIONS  1. Left ventricular ejection fraction, by estimation, is 55 to 60%. The left ventricle has normal function. The left ventricle has no regional wall motion abnormalities. There is mild concentric left ventricular hypertrophy. Left ventricular diastolic parameters are consistent with Grade II diastolic dysfunction (pseudonormalization). Elevated left atrial pressure.  2. Right ventricular systolic function is normal. The right ventricular size is normal. There is mildly elevated pulmonary artery systolic pressure. The estimated right ventricular systolic pressure is 35.9 mmHg.  3. Left atrial size was mildly dilated.  4. The mitral valve is normal in structure. No evidence of mitral valve regurgitation. No evidence of mitral stenosis.  5. The  aortic valve has been repaired/replaced. There is mild calcification of the aortic valve. There is mild thickening of the aortic valve. Aortic valve regurgitation is not visualized. There is a 27 mm Magna bioprosthetic valve present in the aortic  position. Procedure Date: 2011. Echo findings are consistent with normal structure and function of the aortic valve prosthesis. Aortic valve mean gradient measures 5.0 mmHg. Aortic valve Vmax measures 1.50 m/s.  6.  Previous ascending aorta Bentall repair. Aortic dilatation noted. There is mild dilatation of the aortic root, measuring 40 mm. There is mild dilatation of the ascending aorta, measuring 41 mm. Comparison(s): Prior images reviewed side by side. The left ventricular diastolic function is significantly worse. FINDINGS  Left Ventricle: Left ventricular ejection fraction, by estimation, is 55 to 60%. The left ventricle has normal function. The left ventricle has no regional wall motion abnormalities. Definity contrast agent was given IV to delineate the left ventricular  endocardial borders. The left ventricular internal cavity size was normal in size. There is mild concentric left ventricular hypertrophy. Left ventricular diastolic parameters are consistent with Grade II diastolic dysfunction (pseudonormalization). Elevated left atrial pressure. Right Ventricle: The right ventricular size is normal. Right vetricular wall thickness was not well visualized. Right ventricular systolic function is normal. There is mildly elevated pulmonary artery systolic pressure. The tricuspid regurgitant velocity  is 2.78 m/s, and with an assumed right atrial pressure of 5 mmHg, the estimated right ventricular systolic pressure is 35.9 mmHg. Left Atrium: Left atrial size was mildly dilated. Right Atrium: Right atrial size was normal in size. Pericardium: There is no evidence of pericardial effusion. Mitral Valve: The mitral valve is normal in structure. No evidence of mitral valve regurgitation. No evidence of mitral valve stenosis. Tricuspid Valve: The tricuspid valve is normal in structure. Tricuspid valve regurgitation is mild. Aortic Valve: The aortic valve has been repaired/replaced. There is mild calcification of the aortic valve. There is mild thickening of the aortic valve. Aortic valve regurgitation is not visualized. Aortic valve mean gradient measures 5.0 mmHg. Aortic valve peak gradient measures 9.0 mmHg. There is a 27 mm  Magna bioprosthetic valve present in the aortic position. Procedure Date: 2011. Echo findings are consistent with normal structure and function of the aortic valve prosthesis. Pulmonic Valve: The pulmonic valve was grossly normal. Pulmonic valve regurgitation is mild. No evidence of pulmonic stenosis. Aorta: Previous ascending aorta Bentall repair. Aortic dilatation noted, the aortic root was not well visualized and the aortic root is normal in size and structure. There is mild dilatation of the aortic root, measuring 40 mm. There is mild dilatation of the ascending aorta, measuring 41 mm. Venous: The inferior vena cava was not well visualized. IAS/Shunts: The interatrial septum was not well visualized.  LEFT VENTRICLE PLAX 2D LVIDd:         5.40 cm Diastology LVIDs:         4.60 cm LV e' medial:    6.53 cm/s LV PW:         1.40 cm LV E/e' medial:  15.5 LV IVS:        1.30 cm LV e' lateral:   7.00 cm/s                        LV E/e' lateral: 14.4  RIGHT VENTRICLE RV Basal diam:  4.00 cm LEFT ATRIUM             Index        RIGHT  ATRIUM           Index LA diam:        3.80 cm 1.62 cm/m   RA Area:     16.90 cm LA Vol (A2C):   71.7 ml 30.62 ml/m  RA Volume:   46.30 ml  19.77 ml/m LA Vol (A4C):   50.3 ml 21.48 ml/m LA Biplane Vol: 64.4 ml 27.50 ml/m  AORTIC VALVE                   PULMONIC VALVE AV Vmax:           150.00 cm/s PV Vmax:          0.99 m/s AV Vmean:          99.900 cm/s PV Peak grad:     3.9 mmHg AV VTI:            0.336 m     PR End Diast Vel: 5.57 msec AV Peak Grad:      9.0 mmHg AV Mean Grad:      5.0 mmHg LVOT Vmax:         120.00 cm/s LVOT Vmean:        80.000 cm/s LVOT VTI:          0.266 m LVOT/AV VTI ratio: 0.79  AORTA Ao Root diam: 4.00 cm Ao Asc diam:  4.10 cm MITRAL VALVE                TRICUSPID VALVE MV Area (PHT): 3.37 cm     TR Peak grad:   30.9 mmHg MV Decel Time: 225 msec     TR Vmax:        278.00 cm/s MV E velocity: 101.00 cm/s MV A velocity: 76.50 cm/s   SHUNTS MV E/A ratio:  1.32          Systemic VTI: 0.27 m Rachelle Hora Croitoru MD Electronically signed by Thurmon Fair MD Signature Date/Time: 02/22/2023/3:23:51 PM    Final    US Venous Img Lower Unilateral Right Result Date: 02/21/2023 CLINICAL DATA:  Right lower extremity pain and edema for the past 2 weeks. Shortness of breath with exertion. Evaluate for DVT. EXAM: RIGHT LOWER EXTREMITY VENOUS DOPPLER ULTRASOUND TECHNIQUE: Gray-scale sonography with graded compression, as well as color Doppler and duplex ultrasound were performed to evaluate the lower extremity deep venous systems from the level of the common femoral vein and including the common femoral, femoral, profunda femoral, popliteal and calf veins including the posterior tibial, peroneal and gastrocnemius veins when visible. The superficial great saphenous vein was also interrogated. Spectral Doppler was utilized to evaluate flow at rest and with distal augmentation maneuvers in the common femoral, femoral and popliteal veins. COMPARISON:  None Available. FINDINGS: Contralateral Common Femoral Vein: Respiratory phasicity is normal and symmetric with the symptomatic side. No evidence of thrombus. Normal compressibility. Common Femoral Vein: No evidence of thrombus. Normal compressibility, respiratory phasicity and response to augmentation. Saphenofemoral Junction: No evidence of thrombus. Normal compressibility and flow on color Doppler imaging. Profunda Femoral Vein: No evidence of thrombus. Normal compressibility and flow on color Doppler imaging. Femoral Vein: No evidence of thrombus. Normal compressibility, respiratory phasicity and response to augmentation. Popliteal Vein: No evidence of thrombus. Normal compressibility, respiratory phasicity and response to augmentation. Calf Veins: No evidence of thrombus. Normal compressibility and flow on color Doppler imaging. Superficial Great Saphenous Vein: No evidence of thrombus. Normal compressibility. Other Findings:  None.  IMPRESSION: No evidence of DVT within  the right lower extremity. Electronically Signed   By: Simonne Come M.D.   On: 02/21/2023 11:42   DG Chest 2 View Result Date: 02/21/2023 CLINICAL DATA:  Shortness of breath. EXAM: CHEST - 2 VIEW COMPARISON:  04/17/2022. FINDINGS: Bilateral lung fields are clear. Bilateral costophrenic angles are clear. Stable cardio-mediastinal silhouette. Median sternotomy noted. No acute osseous abnormalities. The soft tissues are within normal limits. IMPRESSION: *No active cardiopulmonary disease. Electronically Signed   By: Jules Schick M.D.   On: 02/21/2023 10:40    EKG: 12/12 1002 - sinus with PACs  12/9 1003 -- sinus with junctional bradycardia and blocked PAC 05/02/22 at 1100 - sinus with 1st deg AV block   (personally reviewed)  TELEMETRY: sinus brady with 1st deg AV block with periods of Mobitz 2 (personally reviewed)    Assessment/Plan: #) Mobitz 2 #) dizziness, LH, fatigue #) s/p bentall aortic root replacement Patient with bradycardia, fatigue, and increased SOB x 2-3 weeks  EKG with junctional bradycardia and ?blocked PAC Tele more clearly shows periods of Mobitz 2 block No AV nodal blocking agents TSH normal Explained risks, benefits, and alternatives to PPM implantation, including but not limited to bleeding, infection, pneumothorax, pericardial effusion, lead dislodgement, heart attack, stroke, or death.  Pt verbalized understanding and agrees to proceed.  Will hold 81mg  ASA  #) HTN Has been normotensive since tx to [facility] Home meds currently on hold, cont to monitor        For questions or updates, please contact [facility] Please consult [email or web address] for contact info under Cardiology/STEMI.  Signed, Sherie Don, NP  02/24/2023 8:39 AM

## 2023-02-24 NOTE — H&P (View-Only) (Signed)
ELECTROPHYSIOLOGY CONSULT NOTE    Patient ID: Jeff Johnson MRN: [id number], DOB/AGE: [birth year] 77 y.o.  Admit date: 02/21/2023 Date of Consult: 02/24/2023  Primary Physician: de [place], Buren Kos, MD Primary Cardiologist: Jodelle Red, MD  Electrophysiologist: new to Dr. Jimmey Ralph     Patient Profile: Jeff Johnson is a 77 y.o. 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 Ella Jubilee.  HPI:  Jeff Johnson is a 77 y.o. male with PMH as above.  He has had chronic SOB since his heart surgery in 2011. He noticed his DOE increased at end of November, was also having generalized malaise. During this time he also noticed increased RLE swelling and more labile BP. He began monitoring his HR and BP more closely at home and noticed his HR was in the 40-60s and sometimes dropping to 30s, previously was always in 60s or higher. He saw his PCP 12/9 for SOB complaints  with R leg/calf swelling and pain and was sent to [facility] ER for further evaluation.  While in ER, BNP slightly elevated to 122, CMP and CBC grossly unremarkable. Trop 9 > 8, thyroid labs normal. RLE doppler negative for DVT. His tele revealed intermittent junctional bradycardia and so he was transferred to [facility] for EP evaluation for ppm.   Today, he says that he is unable to walk any significant distance without feeling like he is going to give out. He denies chest pain, chest pressure, palpitations. RLE edema has completely resolved.   He works as a Electrical engineer, and often has to interact with mentally unstable and drunk people.    Labs Potassium4.0 (12/12 0246) Magnesium  2.3 (12/10 0436) Creatinine, ser  1.24 (12/12 0246) PLT  159 (12/12 0246) HGB  13.2 (12/12 0246) WBC 6.9 (12/12 0246) Troponin I (High Sensitivity)8 (12/09 1240).    Past Medical History:  Diagnosis  Date   Arthritis    Benign essential HTN 05/21/2014   BPH (benign prostatic hyperplasia)    Chronic cystitis    a. With ongoing hematuria.   Contrast media allergy    Diverticulitis    a. 2010: diverticulitis with stricture Sigmoid colectomy with mobilization of the splenic flexure within the end anastomosis.    Dizziness and giddiness    a. H/o dizziness with reportedly negative tilt table. Holter 2012: NSR, sinus tachy, frequent PVCs, multifocal at times in a trigeminal fashion.   Dyspnea on exertion 03/18/2014   Frequency of urination    GERD (gastroesophageal reflux disease)    H/O cardiac catheterization    a. 2011: minor luminal irregularities. b. Nuc 03/2014: ow risk without reversible ischemia or infarction, EF 57%. (There is septal akinesis but otherwise normal wall motion. This is nonspecific.)   Headache    MIGRAINES   Hematuria    History of urinary retention    Hyperlipidemia 03/18/2014   Hyponatremia    a. Remote hx hyponatremia felt 2/2 SSRI.   Nocturia    Obesity    Orthostatic hypotension    Prostate cancer (HCC) 2011   a. s/p radiation.   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 11/2009: pericardial AVR/Bentall.   SYNCOPE 02/18/2010   Qualifier: Diagnosis of  By: Ladona Ridgel, MD, Jerrell Mylar  Thoracic aortic aneurysm (HCC)    a. 11/2009: s/p Magna Ease pericardial tissue valve size 27mm and replacement of fusiform ascending thoracic aneurysm with 30-mm Hemashield cath with hemiarch reconstruction.     Surgical History:  Past Surgical History:  Procedure Laterality Date   BACK SURGERY  1985   CARPAL TUNNEL RELEASE     BIL WRISTS   CHOLECYSTECTOMY  2010   COLON SURGERY  2010   colon resection   COLONOSCOPY  2008   [facility]   COLONOSCOPY WITH PROPOFOL N/A 09/02/2017   Procedure: COLONOSCOPY WITH PROPOFOL;  Surgeon: Sherrilyn Rist, MD;  Location: [facility];  Service: Gastroenterology;  Laterality:  N/A;   doppler carotid  2012   DOPPLER ECHOCARDIOGRAPHY  2011   POLYPECTOMY  09/02/2017   Procedure: POLYPECTOMY;  Surgeon: Sherrilyn Rist, MD;  Location: [facility];  Service: Gastroenterology;;   PROSTATE SURGERY  2011   adenocarcinoma w/raddiation    ROTATOR CUFF REPAIR     X2 LEFT SHOULDER   THORACIC AORTIC ANEURYSM REPAIR  12/11/09   ASCENDING THORACIC AORTIC ANEURYSM REPAIR   TISSUE AORTIC VALVE REPLACEMENT  12/11/09   TOTAL HIP ARTHROPLASTY Left 04/17/2015   Procedure: LEFT TOTAL HIP ARTHROPLASTY ANTERIOR APPROACH;  Surgeon: Samson Frederic, MD;  Location: [facility];  Service: Orthopedics;  Laterality: Left;     Medications Prior to Admission  Medication Sig Dispense Refill Last Dose/Taking   acetaminophen (TYLENOL) 650 MG CR tablet Take 1,300 mg by mouth 3 (three) times daily as needed for pain or fever.   02/21/2023   amLODipine (NORVASC) 10 MG tablet Take 1 tablet (10 mg total) by mouth daily. (Patient taking differently: Take 10 mg by mouth at bedtime.) 90 tablet 1 02/20/2023   ASPIRIN 81 PO Take 1 tablet by mouth daily.   02/21/2023   Cholecalciferol (VITAMIN D PO) Take 1 tablet by mouth daily.   02/21/2023   hydrALAZINE (APRESOLINE) 25 MG tablet Take 1 tablet (25 mg total) by mouth 3 (three) times daily as needed (blood pressure more than 160 on the top number). 90 tablet 0 02/21/2023   lansoprazole (PREVACID) 15 MG capsule Take 1 capsule (15 mg total) by mouth daily at 12 noon. 30 capsule 3 02/21/2023   losartan (COZAAR) 50 MG tablet Take 1 tablet (50 mg total) by mouth daily. 90 tablet 1 02/21/2023   montelukast (SINGULAIR) 10 MG tablet Take 1 tablet (10 mg total) by mouth daily. 30 tablet 3 02/21/2023   simvastatin (ZOCOR) 20 MG tablet Take 1 tablet (20 mg total) by mouth daily. 30 tablet 3 02/21/2023   tamsulosin (FLOMAX) 0.4 MG CAPS capsule Take 1 capsule (0.4 mg total) by mouth daily. 30 capsule 6 02/21/2023    Inpatient Medications:   aspirin EC  81 mg Oral Daily   enoxaparin  (LOVENOX) injection  60 mg Subcutaneous Q24H   montelukast  10 mg Oral QHS   pantoprazole  20 mg Oral Daily   simvastatin  20 mg Oral Daily   sodium chloride flush  3 mL Intravenous Q12H   tamsulosin  0.4 mg Oral Daily    Allergies:  Allergies  Allergen Reactions   Beta Adrenergic Blockers Other (See Comments)    Remote reportedh/o intolerance due to weakness per notes.Marland KitchenMarland KitchenMarland KitchenCalled patient. Advised patient of provider's approval for requested procedure, as well as any comments/instructions from provider.    Iodinated Contrast Media Anaphylaxis   Shellfish Allergy Anaphylaxis and Other (See Comments)   Shellfish-Derived Products Anaphylaxis  Family History  Problem Relation Age of Onset   Stroke Mother 57   Heart disease Father 43   Heart attack Father    Colon cancer Maternal Aunt    Stomach cancer Neg Hx    Esophageal cancer Neg Hx      Physical Exam: Vitals:   02/23/23 1937 02/23/23 2318 02/24/23 0311 02/24/23 0731  BP: 133/65 137/69 131/70 120/73  Pulse: 60 66 (!) 55 (!) 50  Resp: 18 16 18 18   Temp: 97.9 F (36.6 C) 97.7 F (36.5 C) 97.8 F (36.6 C) 97.7 F (36.5 C)  TempSrc: Oral Oral Oral Oral  SpO2: 97% 96% 98%   Weight:      Height:        GEN- NAD, A&O x 3, normal affect HEENT: Normocephalic, atraumatic Lungs- CTAB, Normal effort.  Heart- Irregular, slightly bradycardic rate and rhythm, 2/6 murmur at RSB  GI- Soft, NT, ND.  Extremities- No clubbing, cyanosis, or edema   Radiology/Studies: ECHOCARDIOGRAM COMPLETE Result Date: 02/22/2023    ECHOCARDIOGRAM REPORT   Patient Name:   Jeff Johnson Date of Exam: 02/22/2023 Medical Rec #:  [id number]     Height:       72.0 in Accession #:    [id number]    Weight:       249.8 lb Date of Birth:  Mar 07, 1946     BSA:          2.342 m Patient Age:    75 years      BP:           129/69 mmHg Patient Gender: M             HR:           55 bpm. Exam Location:  Inpatient Procedure: 2D Echo, Cardiac Doppler, Color Doppler and  Intracardiac            Opacification Agent Indications:    DOE R06.00  History:        Patient has prior history of Echocardiogram examinations, most                 recent 02/13/2021. Aortic Valve Disease; Risk Factors:Former                 Smoker, Hypertension and Dyslipidemia.                 Aortic Valve: 27 mm Magna bioprosthetic valve is present in the                 aortic position. Procedure Date: 2011.  Sonographer:    Dondra Prader RVT RCS Referring Phys: [id number] TIMOTHY S OPYD  Sonographer Comments: Technically difficult study due to poor echo windows, suboptimal parasternal window, suboptimal apical window and suboptimal subcostal window. Image acquisition challenging due to patient body habitus. IMPRESSIONS  1. Left ventricular ejection fraction, by estimation, is 55 to 60%. The left ventricle has normal function. The left ventricle has no regional wall motion abnormalities. There is mild concentric left ventricular hypertrophy. Left ventricular diastolic parameters are consistent with Grade II diastolic dysfunction (pseudonormalization). Elevated left atrial pressure.  2. Right ventricular systolic function is normal. The right ventricular size is normal. There is mildly elevated pulmonary artery systolic pressure. The estimated right ventricular systolic pressure is 35.9 mmHg.  3. Left atrial size was mildly dilated.  4. The mitral valve is normal in structure. No evidence of mitral valve regurgitation. No evidence of mitral stenosis.  5. The  aortic valve has been repaired/replaced. There is mild calcification of the aortic valve. There is mild thickening of the aortic valve. Aortic valve regurgitation is not visualized. There is a 27 mm Magna bioprosthetic valve present in the aortic  position. Procedure Date: 2011. Echo findings are consistent with normal structure and function of the aortic valve prosthesis. Aortic valve mean gradient measures 5.0 mmHg. Aortic valve Vmax measures 1.50 m/s.  6.  Previous ascending aorta Bentall repair. Aortic dilatation noted. There is mild dilatation of the aortic root, measuring 40 mm. There is mild dilatation of the ascending aorta, measuring 41 mm. Comparison(s): Prior images reviewed side by side. The left ventricular diastolic function is significantly worse. FINDINGS  Left Ventricle: Left ventricular ejection fraction, by estimation, is 55 to 60%. The left ventricle has normal function. The left ventricle has no regional wall motion abnormalities. Definity contrast agent was given IV to delineate the left ventricular  endocardial borders. The left ventricular internal cavity size was normal in size. There is mild concentric left ventricular hypertrophy. Left ventricular diastolic parameters are consistent with Grade II diastolic dysfunction (pseudonormalization). Elevated left atrial pressure. Right Ventricle: The right ventricular size is normal. Right vetricular wall thickness was not well visualized. Right ventricular systolic function is normal. There is mildly elevated pulmonary artery systolic pressure. The tricuspid regurgitant velocity  is 2.78 m/s, and with an assumed right atrial pressure of 5 mmHg, the estimated right ventricular systolic pressure is 35.9 mmHg. Left Atrium: Left atrial size was mildly dilated. Right Atrium: Right atrial size was normal in size. Pericardium: There is no evidence of pericardial effusion. Mitral Valve: The mitral valve is normal in structure. No evidence of mitral valve regurgitation. No evidence of mitral valve stenosis. Tricuspid Valve: The tricuspid valve is normal in structure. Tricuspid valve regurgitation is mild. Aortic Valve: The aortic valve has been repaired/replaced. There is mild calcification of the aortic valve. There is mild thickening of the aortic valve. Aortic valve regurgitation is not visualized. Aortic valve mean gradient measures 5.0 mmHg. Aortic valve peak gradient measures 9.0 mmHg. There is a 27 mm  Magna bioprosthetic valve present in the aortic position. Procedure Date: 2011. Echo findings are consistent with normal structure and function of the aortic valve prosthesis. Pulmonic Valve: The pulmonic valve was grossly normal. Pulmonic valve regurgitation is mild. No evidence of pulmonic stenosis. Aorta: Previous ascending aorta Bentall repair. Aortic dilatation noted, the aortic root was not well visualized and the aortic root is normal in size and structure. There is mild dilatation of the aortic root, measuring 40 mm. There is mild dilatation of the ascending aorta, measuring 41 mm. Venous: The inferior vena cava was not well visualized. IAS/Shunts: The interatrial septum was not well visualized.  LEFT VENTRICLE PLAX 2D LVIDd:         5.40 cm Diastology LVIDs:         4.60 cm LV e' medial:    6.53 cm/s LV PW:         1.40 cm LV E/e' medial:  15.5 LV IVS:        1.30 cm LV e' lateral:   7.00 cm/s                        LV E/e' lateral: 14.4  RIGHT VENTRICLE RV Basal diam:  4.00 cm LEFT ATRIUM             Index        RIGHT  ATRIUM           Index LA diam:        3.80 cm 1.62 cm/m   RA Area:     16.90 cm LA Vol (A2C):   71.7 ml 30.62 ml/m  RA Volume:   46.30 ml  19.77 ml/m LA Vol (A4C):   50.3 ml 21.48 ml/m LA Biplane Vol: 64.4 ml 27.50 ml/m  AORTIC VALVE                   PULMONIC VALVE AV Vmax:           150.00 cm/s PV Vmax:          0.99 m/s AV Vmean:          99.900 cm/s PV Peak grad:     3.9 mmHg AV VTI:            0.336 m     PR End Diast Vel: 5.57 msec AV Peak Grad:      9.0 mmHg AV Mean Grad:      5.0 mmHg LVOT Vmax:         120.00 cm/s LVOT Vmean:        80.000 cm/s LVOT VTI:          0.266 m LVOT/AV VTI ratio: 0.79  AORTA Ao Root diam: 4.00 cm Ao Asc diam:  4.10 cm MITRAL VALVE                TRICUSPID VALVE MV Area (PHT): 3.37 cm     TR Peak grad:   30.9 mmHg MV Decel Time: 225 msec     TR Vmax:        278.00 cm/s MV E velocity: 101.00 cm/s MV A velocity: 76.50 cm/s   SHUNTS MV E/A ratio:  1.32          Systemic VTI: 0.27 m Rachelle Hora Croitoru MD Electronically signed by Thurmon Fair MD Signature Date/Time: 02/22/2023/3:23:51 PM    Final    US Venous Img Lower Unilateral Right Result Date: 02/21/2023 CLINICAL DATA:  Right lower extremity pain and edema for the past 2 weeks. Shortness of breath with exertion. Evaluate for DVT. EXAM: RIGHT LOWER EXTREMITY VENOUS DOPPLER ULTRASOUND TECHNIQUE: Gray-scale sonography with graded compression, as well as color Doppler and duplex ultrasound were performed to evaluate the lower extremity deep venous systems from the level of the common femoral vein and including the common femoral, femoral, profunda femoral, popliteal and calf veins including the posterior tibial, peroneal and gastrocnemius veins when visible. The superficial great saphenous vein was also interrogated. Spectral Doppler was utilized to evaluate flow at rest and with distal augmentation maneuvers in the common femoral, femoral and popliteal veins. COMPARISON:  None Available. FINDINGS: Contralateral Common Femoral Vein: Respiratory phasicity is normal and symmetric with the symptomatic side. No evidence of thrombus. Normal compressibility. Common Femoral Vein: No evidence of thrombus. Normal compressibility, respiratory phasicity and response to augmentation. Saphenofemoral Junction: No evidence of thrombus. Normal compressibility and flow on color Doppler imaging. Profunda Femoral Vein: No evidence of thrombus. Normal compressibility and flow on color Doppler imaging. Femoral Vein: No evidence of thrombus. Normal compressibility, respiratory phasicity and response to augmentation. Popliteal Vein: No evidence of thrombus. Normal compressibility, respiratory phasicity and response to augmentation. Calf Veins: No evidence of thrombus. Normal compressibility and flow on color Doppler imaging. Superficial Great Saphenous Vein: No evidence of thrombus. Normal compressibility. Other Findings:  None.  IMPRESSION: No evidence of DVT within  the right lower extremity. Electronically Signed   By: Simonne Come M.D.   On: 02/21/2023 11:42   DG Chest 2 View Result Date: 02/21/2023 CLINICAL DATA:  Shortness of breath. EXAM: CHEST - 2 VIEW COMPARISON:  04/17/2022. FINDINGS: Bilateral lung fields are clear. Bilateral costophrenic angles are clear. Stable cardio-mediastinal silhouette. Median sternotomy noted. No acute osseous abnormalities. The soft tissues are within normal limits. IMPRESSION: *No active cardiopulmonary disease. Electronically Signed   By: Jules Schick M.D.   On: 02/21/2023 10:40    EKG: 12/12 1002 - sinus with PACs  12/9 1003 -- sinus with junctional bradycardia and blocked PAC 05/02/22 at 1100 - sinus with 1st deg AV block   (personally reviewed)  TELEMETRY: sinus brady with 1st deg AV block with periods of Mobitz 2 (personally reviewed)    Assessment/Plan: #) Mobitz 2 #) dizziness, LH, fatigue #) s/p bentall aortic root replacement Patient with bradycardia, fatigue, and increased SOB x 2-3 weeks  EKG with junctional bradycardia and ?blocked PAC Tele more clearly shows periods of Mobitz 2 block No AV nodal blocking agents TSH normal Explained risks, benefits, and alternatives to PPM implantation, including but not limited to bleeding, infection, pneumothorax, pericardial effusion, lead dislodgement, heart attack, stroke, or death.  Pt verbalized understanding and agrees to proceed.  Will hold 81mg  ASA  #) HTN Has been normotensive since tx to [facility] Home meds currently on hold, cont to monitor        For questions or updates, please contact [facility] Please consult [email or web address] for contact info under Cardiology/STEMI.  Signed, Sherie Don, NP  02/24/2023 8:39 AM

## 2023-02-24 NOTE — Progress Notes (Signed)
   02/24/23 1131  TOC Brief Assessment  Insurance and Status Reviewed  Patient has primary care physician Yes  Home environment has been reviewed home  Prior level of function: independent  Prior/Current Home Services No current home services  Social Drivers of Health Review SDOH reviewed no interventions necessary  Readmission risk has been reviewed Yes  Transition of care needs no transition of care needs at this time     Note EP consulted for possible PPM, We will continue to monitor patient advancement through interdisciplinary progression rounds. If new patient transition needs arise, please place a TOC consult.

## 2023-02-24 NOTE — Hospital Course (Addendum)
Jeff Johnson was admitted to the hospital with the working diagnosis of bradycardia.   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 He developed dyspnea with minimal efforts.  Patient was evaluated by his primary care physician and was referred to the ED for further evaluation. On his initial physical examination his blood pressure was 112/53, HR 54, RR 14 and 02 saturation 100%, lungs with no wheezing or rales, heart with S1 and S2 present and regular with no gallops, rubs or murmurs, abdomen with no distention and positive lower extremity edema, more right than left..  Na 138, K 4.1 CL 104 bicarbonate 24, glucose 107 bun 27 cr 1,18  BNP 122  High sensitive troponin 9 and 8  Wbc 5,9 hgb 14.6 plt 170   Chest radiograph with no cardiomegaly, mild hilar vascular congestion with no infiltrates or effusions.   EKG 60 bpm, normal axis, normal intervals, sinus rhythm with 2nd degree AV block type 1, with sinus beats 1st degree AV block with aberrant (RBB) conduction with no significant ST segment or T wave changes.   Patient having sinus pauses, 2.5 seconds on telemetry, sinus bradycardia with intermittent 2nd degree AV block type 2.  EP consulted and recommended pacemaker implantation.   12/12 pacemaker implanted.  12/13 plan for discharge home and follow up as outpatient.

## 2023-02-24 NOTE — Assessment & Plan Note (Addendum)
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  Patient was consulted with EP and pacemaker was implanted.  DDD PPM with LBBAP lead implant.  Plan to follow up as outpatient.

## 2023-02-24 NOTE — Progress Notes (Signed)
Pt came back to rm 2 from cath lab. Reinitiated tele. Vss. Call bell within reach.   Lawson Radar, RN

## 2023-02-24 NOTE — Assessment & Plan Note (Addendum)
At the time of his discharge his renal function has improved, with serum cr at 1,16 with K at 3,9 and serum bicarbonate at 22.  Na 136  Plan to resume losartan and follow up as outpatient.

## 2023-02-24 NOTE — Progress Notes (Addendum)
  Progress Note   Patient: Jeff Johnson MWU:[id number] DOB: 08-29-1945 DOA: 02/21/2023     0 DOS: the patient was seen and examined on 02/24/2023   Brief hospital course: Jeff Johnson was admitted to the hospital with the working diagnosis of bradycardia.   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 He developed dyspnea with minimal efforts.  Patient was evaluated by his primary care physician and was referred to the ED for further evaluation. On his initial physical examination his blood pressure was 112/53, HR 54, RR 14 and 02 saturation 100%, lungs with no wheezing or rales, heart with S1 and S2 present and regular with no gallops, rubs or murmurs, abdomen with no distention and positive lower extremity edema, more right than left..  Chest radiograph with no cardiomegaly, mild hilar vascular congestion with no infiltrates or effusions.   EKG 60 bpm, normal axis, normal intervals, sinus rhythm with 2nd degree AV block type 1, with sinus beats 1st degree AV block with aberrant (RBB) conduction with no significant ST segment or T wave changes.   Patient having sinus pauses, 2.5 seconds on telemetry,  EP consulted and recommended pacemaker implantation.     Assessment and Plan: * Symptomatic bradycardia Pacemaker implantation today.  Follow up with EP recommendations.   Acute on chronic diastolic heart failure (HCC) Echocardiogram with preserved LV systolic function with EF 55 to 60%, mild LVH, RV systolic function preserved. RVSP 35.9 mmHg. LA with mild dilatation.   Volume status has improved.  Currently off diuretic therapy.   Essential hypertension Continue blood pressure monitoring.   Hyperlipidemia Patient off statin therapy.    Obesity Calculated BMI is 32.5   AKI (acute kidney injury) (HCC) Renal function with trending down serum cr at 1,24 with K at 4,0 and serum bicarbonate at 23.  Na 138  Follow up renal function in am.  Continue to hold on loop diuretic for now.    Subjective: Patient is feeling well, had pacemaker implantation. No dyspnea   Physical Exam: Vitals:   02/24/23 1523 02/24/23 1621 02/24/23 1654 02/24/23 1716  BP:      Pulse:      Resp:      Temp:      TempSrc:      SpO2: 99% 90% 97% 98%  Weight:      Height:       Neurology awake and alert ENT with mild pallor Cardiovascular with S1 and S2 present and regular  Respiratory with no rales or wheezing Abdomen with no distention  No lower extremity edema  Data Reviewed:    Family Communication: no family at the bedside   Disposition: Status is: Inpatient Remains inpatient appropriate because: pacemaker implantation   Planned Discharge Destination: Home     Author: Coralie Keens, MD 02/24/2023 5:43 PM  For on call review [email or web address].

## 2023-02-25 ENCOUNTER — Telehealth: Payer: Self-pay

## 2023-02-25 ENCOUNTER — Encounter (HOSPITAL_COMMUNITY): Payer: Self-pay | Admitting: Cardiology

## 2023-02-25 ENCOUNTER — Inpatient Hospital Stay (HOSPITAL_COMMUNITY): Payer: Medicare Other

## 2023-02-25 DIAGNOSIS — R001 Bradycardia, unspecified: Secondary | ICD-10-CM | POA: Diagnosis not present

## 2023-02-25 DIAGNOSIS — N179 Acute kidney failure, unspecified: Secondary | ICD-10-CM | POA: Diagnosis not present

## 2023-02-25 DIAGNOSIS — I10 Essential (primary) hypertension: Secondary | ICD-10-CM | POA: Diagnosis not present

## 2023-02-25 DIAGNOSIS — I5033 Acute on chronic diastolic (congestive) heart failure: Secondary | ICD-10-CM | POA: Diagnosis not present

## 2023-02-25 LAB — BASIC METABOLIC PANEL
Anion gap: 8 (ref 5–15)
BUN: 24 mg/dL — ABNORMAL HIGH (ref 8–23)
CO2: 22 mmol/L (ref 22–32)
Calcium: 9.1 mg/dL (ref 8.9–10.3)
Chloride: 106 mmol/L (ref 98–111)
Creatinine, Ser: 1.16 mg/dL (ref 0.61–1.24)
GFR, Estimated: >60 mL/min (ref >60)
Glucose, Bld: 105 mg/dL — ABNORMAL HIGH (ref 70–99)
Potassium: 3.9 mmol/L (ref 3.5–5.1)
Sodium: 136 mmol/L (ref 135–145)

## 2023-02-25 LAB — GLUCOSE, CAPILLARY: Glucose-Capillary: 110 mg/dL — ABNORMAL HIGH (ref 70–99)

## 2023-02-25 NOTE — Progress Notes (Signed)
 Patient discharged from unit  medications and property returned to designated person. Discharge instructions reviewed and patient questions answered.

## 2023-02-25 NOTE — Progress Notes (Addendum)
Rounding Note    Patient Name: Jeff Johnson Date of Encounter: 02/25/2023  [facility] Cardiologist: Jodelle Red, MD   Subjective   Feels well, minimal ache at implant site, he is hoping this will help him feel better/better exertional capacity  Inpatient Medications    Scheduled Meds:  montelukast  10 mg Oral QHS   pantoprazole  20 mg Oral Daily   simvastatin  20 mg Oral Daily   sodium chloride flush  3 mL Intravenous Q12H   tamsulosin  0.4 mg Oral Daily   Continuous Infusions:   ceFAZolin (ANCEF) IV 1 g (02/25/23 0619)   PRN Meds: acetaminophen **OR** acetaminophen, ondansetron (ZOFRAN) IV, senna-docusate   Vital Signs    Vitals:   02/24/23 1953 02/24/23 2310 02/25/23 0525 02/25/23 0601  BP: 111/64 114/67 133/64   Pulse: 73  60   Resp: 19 16 12    Temp: 97.7 F (36.5 C) 98 F (36.7 C) 98 F (36.7 C)   TempSrc: Oral Oral Oral   SpO2: 97% 97% 97%   Weight:    108.6 kg  Height:        Intake/Output Summary (Last 24 hours) at 02/25/2023 0923 Last data filed at 02/25/2023 5366 Gross per 24 hour  Intake 129.67 ml  Output --  Net 129.67 ml      02/25/2023    6:01 AM 02/23/2023    5:35 AM 02/22/2023    5:00 AM  Last 3 Weights  Weight (lbs) 239 lb 6.4 oz 240 lb 1.6 oz 249 lb 12.5 oz  Weight (kg) 108.591 kg 108.909 kg 113.3 kg      Telemetry    Mostly AV paced, SR/Pacing intermittently - Personally Reviewed  ECG    AV paced 60bpm - Personally Reviewed  Physical Exam   GEN: No acute distress.   Neck: No JVD Cardiac: RRR, soft SM, no rubs, or gallops.  Respiratory: CTA b/l. GI: Soft, nontender, non-distended  MS: No edema; No deformity. Neuro:  Nonfocal  Psych: Normal affect   PPM site: stable, no bleeding/hematoma, drainage  Labs    High Sensitivity Troponin:   Recent Labs  Lab 02/21/23 1020 02/21/23 1240  TROPONINIHS 9 8     Chemistry Recent Labs  Lab 02/21/23 1020 02/22/23 0436 02/23/23 0454  02/24/23 0246 02/25/23 0239  NA 138 138 134* 138 136  K 4.1 4.1 3.5 4.0 3.9  CL 104 106 102 108 106  CO2 24 22 23 23 22   GLUCOSE 107* 100* 111* 103* 105*  BUN 22 25* 29* 28* 24*  CREATININE 1.18 1.12 1.39* 1.24 1.16  CALCIUM 9.4 9.2 9.3 9.3 9.1  MG  --  2.3  --   --   --   PROT 7.5 7.0  --   --   --   ALBUMIN 4.5 4.0  --   --   --   AST 19 20  --   --   --   ALT 13 16  --   --   --   ALKPHOS 63 53  --   --   --   BILITOT 2.3* 2.4*  --   --   --   GFRNONAA >60 >60 52* 59* >60  ANIONGAP 10 10 9 7 8     Lipids No results for input(s): "CHOL", "TRIG", "HDL", "LABVLDL", "LDLCALC", "CHOLHDL" in the last 168 hours.  Hematology Recent Labs  Lab 02/22/23 0436 02/23/23 0454 02/24/23 0246  WBC 5.6 5.0 6.9  RBC  4.40 4.47 4.43  HGB 13.5 13.6 13.2  HCT 41.2 41.2 39.7  MCV 93.6 92.2 89.6  MCH 30.7 30.4 29.8  MCHC 32.8 33.0 33.2  RDW 12.4 12.4 12.4  PLT 155 150 159   Thyroid  Recent Labs  Lab 02/22/23 0436  TSH 2.243    BNP Recent Labs  Lab 02/21/23 1020  BNP 122.6*    DDimer  Recent Labs  Lab 02/21/23 1020  DDIMER 2.44*     Radiology    DG Chest 2 View Result Date: 02/25/2023 CLINICAL DATA:  Cardiac device in situ. EXAM: CHEST - 2 VIEW COMPARISON:  February 21, 2023. FINDINGS: The heart size and mediastinal contours are within normal limits. Left-sided pacemaker is unchanged. Status post aortic valve repair. Both lungs are clear. The visualized skeletal structures are unremarkable. IMPRESSION: No active cardiopulmonary disease. Electronically Signed   By: Lupita Raider M.D.   On: 02/25/2023 08:45   EP PPM/ICD IMPLANT Result Date: 02/24/2023  CONCLUSIONS:  1. 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  2.  No early apparent complications. Nobie Putnam, MD Cardiac Electrophysiology    Cardiac Studies   Echo 02/22/23:   1. Left ventricular ejection fraction, by estimation, is 55 to 60%. The  left ventricle has normal function. The left ventricle has no regional   wall motion abnormalities. There is mild concentric left ventricular  hypertrophy. Left ventricular diastolic  parameters are consistent with Grade II diastolic dysfunction  (pseudonormalization). Elevated left atrial pressure.   2. Right ventricular systolic function is normal. The right ventricular  size is normal. There is mildly elevated pulmonary artery systolic  pressure. The estimated right ventricular systolic pressure is 35.9 mmHg.   3. Left atrial size was mildly dilated.   4. The mitral valve is normal in structure. No evidence of mitral valve  regurgitation. No evidence of mitral stenosis.   5. The aortic valve has been repaired/replaced. There is mild  calcification of the aortic valve. There is mild thickening of the aortic  valve. Aortic valve regurgitation is not visualized. 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 Procedure Date: 2011. Echo findings are consistent with normal  structure and function of the aortic valve prosthesis. Aortic valve mean  gradient measures 5.0 mmHg. Aortic valve Vmax measures 1.50 m/s.   6. Previous ascending aorta Bentall repair. Aortic dilatation noted.  There is mild dilatation of the aortic root, measuring 40 mm. There is  mild dilatation of the ascending aorta, measuring 41 mm.  Patient Profile     77 y.o. 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 Jimmey Ralph will see later this morning Anticipate ready for discharge   ADDEND: Dr. Jimmey Ralph has seen the patient Clear for discharged from EP perspectve  when ready medically otherwise   For questions or updates, please contact [facility] Please consult [email or web address] for contact info under        Signed, Sheilah Pigeon, PA-C  02/25/2023, 9:23 AM

## 2023-02-25 NOTE — Discharge Instructions (Signed)
After Your Pacemaker   You have a Abbott Pacemaker  ACTIVITY Do not lift your arm above shoulder height for 1 week after your procedure. After 7 days, you may progress as below.  You should remove your sling 24 hours after your procedure, unless otherwise instructed by your provider.     Friday March 04, 2023  Saturday March 05, 2023 Sunday March 06, 2023 Monday March 07, 2023   Do not lift, push, pull, or carry anything over 10 pounds with the affected arm until 6 weeks (Friday April 08, 2023 ) after your procedure.   You may drive AFTER your wound check, unless you have been told otherwise by your provider.   Ask your healthcare provider when you can go back to work   INCISION/Dressing   Monitor your Pacemaker site for redness, swelling, and drainage. Call the device clinic at [phone] if you experience these symptoms or fever/chills.  If your incision is sealed with Steri-strips or staples, you may shower 7 days after your procedure or when told by your provider. Do not remove the steri-strips or let the shower hit directly on your site. You may wash around your site with soap and water.    If you were discharged in a sling, please do not wear this during the day more than 48 hours after your surgery unless otherwise instructed. This may increase the risk of stiffness and soreness in your shoulder.   Avoid lotions, ointments, or perfumes over your incision until it is well-healed.  You may use a hot tub or a pool AFTER your wound check appointment if the incision is completely closed.  Pacemaker Alerts:  Some alerts are vibratory and others beep. These are NOT emergencies. Please call our office to let us know. If this occurs at night or on weekends, it can wait until the next business day. Send a remote transmission.  If your device is capable of reading fluid status (for heart failure), you will be offered monthly monitoring to review this with you.   DEVICE  MANAGEMENT Remote monitoring is used to monitor your pacemaker from home. This monitoring is scheduled every 91 days by our office. It allows [place] to keep an eye on the functioning of your device to ensure it is working properly. You will routinely see your Electrophysiologist annually (more often if necessary).   You should receive your ID card for your new device in 4-8 weeks. Keep this card with you at all times once received. Consider wearing a medical alert bracelet or necklace.  Your Pacemaker may be MRI compatible. This will be discussed at your next office visit/wound check.  You should avoid contact with strong electric or magnetic fields.   Do not use amateur (ham) radio equipment or electric (arc) welding torches. MP3 player headphones with magnets should not be used. Some devices are safe to use if held at least 12 inches (30 cm) from your Pacemaker. These include power tools, lawn mowers, and speakers. If you are unsure if something is safe to use, ask your health care provider.  When using your cell phone, hold it to the ear that is on the opposite side from the Pacemaker. Do not leave your cell phone in a pocket over the Pacemaker.  You may safely use electric blankets, heating pads, computers, and microwave ovens.  Call the office right away if: You have chest pain. You feel more short of breath than you have felt before. You feel more light-headed than you  have felt before. Your incision starts to open up.  This information is not intended to replace advice given to you by your health care provider. Make sure you discuss any questions you have with your health care provider.

## 2023-02-25 NOTE — Telephone Encounter (Signed)
Follow-up after same day discharge: Implant date: 02/24/2023 MD: Nobie Putnam Device: St Jude Ppm Assurity Location: Left Chest   Wound check visit: 03/10/2023 90 day MD follow-up: 06/01/2023  Remote Transmission received:yes  Dressing/sling removed: Yes  Confirm OAC restart on: yes  Please continue to monitor your cardiac device site for redness, swelling, and drainage. Call the device clinic at [phone] if you experience these symptoms, fever/chills, or have questions about your device.   Remote monitoring is used to monitor your cardiac device from home. This monitoring is scheduled every 91 days by our office. It allows [place] to keep an eye on the functioning of your device to ensure it is working properly.

## 2023-02-25 NOTE — Progress Notes (Signed)
Mobility Specialist Progress Note:   02/25/23 1100  Mobility  Activity Ambulated with assistance in hallway  Level of Assistance Modified independent, requires aide device or extra time  Assistive Device Cane  Distance Ambulated (ft) 350 ft  Activity Response Tolerated well  Mobility Referral Yes  Mobility visit 1 Mobility  Mobility Specialist Start Time (ACUTE ONLY) 1130  Mobility Specialist Stop Time (ACUTE ONLY) 1145  Mobility Specialist Time Calculation (min) (ACUTE ONLY) 15 min   Pre Mobility: 63 HR , During Mobility: 78 HR Post Mobility: 64 HR   Pt received EOB, agreeable to mobility. Denied any discomfort during ambulation, asx throughout. Pt returned to bed with call bell in reach and all needs met.   Leory Plowman  Mobility Specialist Please contact via [employer] office at [phone]

## 2023-02-25 NOTE — Discharge Summary (Addendum)
Physician Discharge Summary   Patient: Jeff Johnson MRN: [id number] DOB: June 07, 1945  Admit date:     02/21/2023  Discharge date: 02/25/23  Discharge Physician: York Ram Desaray Marschner   PCP: de [place], Buren Kos, MD   Recommendations at discharge:    Patient had pacemaker implanted for advanced heart block,  DDD PPM with LBBAP lead implant.  Plan to hold on amlodipine and hydralazine to avoid hypotension.  Possible addition of SGLT 2 inh as outpatient.  Follow up renal function and electrolytes in 7 days as outpatient.  Follow up with Dr Tommi Rumps [place] in 7 to 10 days. Follow up with Cardiology as outpatient, as scheduled.    Discharge Diagnoses: Principal Problem:   Symptomatic bradycardia Active Problems:   Acute on chronic diastolic heart failure (HCC)   Essential hypertension   AKI (acute kidney injury) (HCC)   Obesity   Hyperlipidemia  Resolved Problems:   * No resolved hospital problems. [name] Course: [name] was admitted to the hospital with the working diagnosis of bradycardia.   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 He developed dyspnea with minimal efforts.  Patient was evaluated by his primary care physician and was referred to the ED for further evaluation. On his initial physical examination his blood pressure was 112/53, HR 54, RR 14 and 02 saturation 100%, lungs with no wheezing or rales, heart with S1 and S2 present and regular with no gallops, rubs or murmurs, abdomen with no distention and positive lower extremity edema, more right than left..  Na 138, K 4.1 CL 104 bicarbonate 24, glucose 107 bun 27 cr 1,18  BNP 122  High sensitive troponin 9 and 8  Wbc 5,9 hgb 14.6 plt 170   Chest radiograph with no cardiomegaly,  mild hilar vascular congestion with no infiltrates or effusions.   EKG 60 bpm, normal axis, normal intervals, sinus rhythm with 2nd degree AV block type 1, with sinus beats 1st degree AV block with aberrant (RBB) conduction with no significant ST segment or T wave changes.   Patient having sinus pauses, 2.5 seconds on telemetry, sinus bradycardia with intermittent 2nd degree AV block type 2.  EP consulted and recommended pacemaker implantation.   12/12 pacemaker implanted.  12/13 plan for discharge home and follow up as outpatient.    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  Patient was consulted with EP and pacemaker was implanted.  DDD PPM with LBBAP lead implant.  Plan to follow up as outpatient.   Acute on chronic diastolic heart failure (HCC) Echocardiogram with preserved LV systolic function with EF 55 to 60%, mild LVH, RV systolic function preserved. RVSP 35.9 mmHg. LA with mild dilatation.   Patient had furosemide for diuresis, negative volume status was achieved with significant improvement in his symptoms. He lost about 9 Kg during this hospitalization.   Plan to continue with losartan for blood pressure control.  Follow up as outpatient.    Essential hypertension Patient will resume losartan for blood pressure control.  Hold on amlodipine and hydralazine for now.  Follow up as outpatient.   AKI (acute kidney injury) (HCC) At the time of his discharge his renal function has improved, with serum cr at 1,16 with K at 3,9 and serum  bicarbonate at 22.  Na 136  Plan to resume losartan and follow up as outpatient.   Obesity Calculated BMI is 32.5   Hyperlipidemia Continue taking simvastatin.           Consultants: cardiology Procedures performed: pacemaker implantation   Disposition: Home Diet recommendation:  Discharge Diet Orders (From admission, onward)     Start     Ordered   02/25/23 0000   Diet - low sodium heart healthy        02/25/23 1002           Cardiac diet DISCHARGE MEDICATION: Allergies as of 02/25/2023       Reactions   Beta Adrenergic Blockers Other (See Comments)   Remote reportedh/o intolerance due to weakness per notes.Marland KitchenMarland KitchenMarland KitchenCalled patient. Advised patient of provider's approval for requested procedure, as well as any comments/instructions from provider.    Iodinated Contrast Media Anaphylaxis   Shellfish Allergy Anaphylaxis, Other (See Comments)   Shellfish-derived Products Anaphylaxis        Medication List     STOP taking these medications    amLODipine 10 MG tablet Commonly known as: NORVASC   hydrALAZINE 25 MG tablet Commonly known as: APRESOLINE       TAKE these medications    acetaminophen 650 MG CR tablet Commonly known as: TYLENOL Take 1,300 mg by mouth 3 (three) times daily as needed for pain or fever.   ASPIRIN 81 PO Take 1 tablet by mouth daily.   lansoprazole 15 MG capsule Commonly known as: PREVACID Take 1 capsule (15 mg total) by mouth daily at 12 noon.   losartan 50 MG tablet Commonly known as: COZAAR Take 1 tablet (50 mg total) by mouth daily.   montelukast 10 MG tablet Commonly known as: SINGULAIR Take 1 tablet (10 mg total) by mouth daily.   simvastatin 20 MG tablet Commonly known as: ZOCOR Take 1 tablet (20 mg total) by mouth daily.   tamsulosin 0.4 MG Caps capsule Commonly known as: FLOMAX Take 1 capsule (0.4 mg total) by mouth daily.   VITAMIN D PO Take 1 tablet by mouth daily.        Discharge Exam: Filed Weights   02/22/23 0500 02/23/23 0535 02/25/23 0601  Weight: 113.3 kg 108.9 kg 108.6 kg   BP 133/64 (BP Location: Right Arm)   Pulse 60   Temp 98 F (36.7 C) (Oral)   Resp 12   Ht 6' (1.829 m)   Wt 108.6 kg   SpO2 97%   BMI 32.47 kg/m   Patient is feeling better, no chest pain or dyspnea ENT with no pallor Cardiovascular with S1 and S2 present and regular with no gallops, rubs  or murmurs Respiratory with no rales or wheezing Abdomen with no distention  No lower extremity edema   Condition at discharge: stable  The results of significant diagnostics from this hospitalization (including imaging, microbiology, ancillary and laboratory) are listed below for reference.   Imaging Studies: DG Chest 2 View Result Date: 02/25/2023 CLINICAL DATA:  Cardiac device in situ. EXAM: CHEST - 2 VIEW COMPARISON:  February 21, 2023. FINDINGS: The heart size and mediastinal contours are within normal limits. Left-sided pacemaker is unchanged. Status post aortic valve repair. Both lungs are clear. The visualized skeletal structures are unremarkable. IMPRESSION: No active cardiopulmonary disease. Electronically Signed   By: Lupita Raider M.D.   On: 02/25/2023 08:45   EP PPM/ICD IMPLANT Result Date: 02/24/2023  CONCLUSIONS:  1. Successful DDD PPM with LBBAP  lead implant.  2.  No early apparent complications. Nobie Putnam, MD Cardiac Electrophysiology   ECHOCARDIOGRAM COMPLETE Result Date: 02/22/2023    ECHOCARDIOGRAM REPORT   Patient Name:   SAMRIDH NABA Date of Exam: 02/22/2023 Medical Rec #:  [id number]     Height:       72.0 in Accession #:    [id number]    Weight:       249.8 lb Date of Birth:  Feb 21, 1946     BSA:          2.342 m Patient Age:    7 years      BP:           129/69 mmHg Patient Gender: M             HR:           55 bpm. Exam Location:  Inpatient Procedure: 2D Echo, Cardiac Doppler, Color Doppler and Intracardiac            Opacification Agent Indications:    DOE R06.00  History:        Patient has prior history of Echocardiogram examinations, most                 recent 02/13/2021. Aortic Valve Disease; Risk Factors:Former                 Smoker, Hypertension and Dyslipidemia.                 Aortic Valve: 27 mm Magna bioprosthetic valve is present in the                 aortic position. Procedure Date: 2011.  Sonographer:    Dondra Prader RVT RCS Referring Phys: [id number]  TIMOTHY S OPYD  Sonographer Comments: Technically difficult study due to poor echo windows, suboptimal parasternal window, suboptimal apical window and suboptimal subcostal window. Image acquisition challenging due to patient body habitus. IMPRESSIONS  1. Left ventricular ejection fraction, by estimation, is 55 to 60%. The left ventricle has normal function. The left ventricle has no regional wall motion abnormalities. There is mild concentric left ventricular hypertrophy. Left ventricular diastolic parameters are consistent with Grade II diastolic dysfunction (pseudonormalization). Elevated left atrial pressure.  2. Right ventricular systolic function is normal. The right ventricular size is normal. There is mildly elevated pulmonary artery systolic pressure. The estimated right ventricular systolic pressure is 35.9 mmHg.  3. Left atrial size was mildly dilated.  4. The mitral valve is normal in structure. No evidence of mitral valve regurgitation. No evidence of mitral stenosis.  5. The aortic valve has been repaired/replaced. There is mild calcification of the aortic valve. There is mild thickening of the aortic valve. Aortic valve regurgitation is not visualized. There is a 27 mm Magna bioprosthetic valve present in the aortic  position. Procedure Date: 2011. Echo findings are consistent with normal structure and function of the aortic valve prosthesis. Aortic valve mean gradient measures 5.0 mmHg. Aortic valve Vmax measures 1.50 m/s.  6. Previous ascending aorta Bentall repair. Aortic dilatation noted. There is mild dilatation of the aortic root, measuring 40 mm. There is mild dilatation of the ascending aorta, measuring 41 mm. Comparison(s): Prior images reviewed side by side. The left ventricular diastolic function is significantly worse. FINDINGS  Left Ventricle: Left ventricular ejection fraction, by estimation, is 55 to 60%. The left ventricle has normal function. The left ventricle has no regional wall  motion abnormalities. Definity contrast agent was given  IV to delineate the left ventricular  endocardial borders. The left ventricular internal cavity size was normal in size. There is mild concentric left ventricular hypertrophy. Left ventricular diastolic parameters are consistent with Grade II diastolic dysfunction (pseudonormalization). Elevated left atrial pressure. Right Ventricle: The right ventricular size is normal. Right vetricular wall thickness was not well visualized. Right ventricular systolic function is normal. There is mildly elevated pulmonary artery systolic pressure. The tricuspid regurgitant velocity  is 2.78 m/s, and with an assumed right atrial pressure of 5 mmHg, the estimated right ventricular systolic pressure is 35.9 mmHg. Left Atrium: Left atrial size was mildly dilated. Right Atrium: Right atrial size was normal in size. Pericardium: There is no evidence of pericardial effusion. Mitral Valve: The mitral valve is normal in structure. No evidence of mitral valve regurgitation. No evidence of mitral valve stenosis. Tricuspid Valve: The tricuspid valve is normal in structure. Tricuspid valve regurgitation is mild. Aortic Valve: The aortic valve has been repaired/replaced. There is mild calcification of the aortic valve. There is mild thickening of the aortic valve. Aortic valve regurgitation is not visualized. Aortic valve mean gradient measures 5.0 mmHg. Aortic valve peak gradient measures 9.0 mmHg. There is a 27 mm Magna bioprosthetic valve present in the aortic position. Procedure Date: 2011. Echo findings are consistent with normal structure and function of the aortic valve prosthesis. Pulmonic Valve: The pulmonic valve was grossly normal. Pulmonic valve regurgitation is mild. No evidence of pulmonic stenosis. Aorta: Previous ascending aorta Bentall repair. Aortic dilatation noted, the aortic root was not well visualized and the aortic root is normal in size and structure. There is mild  dilatation of the aortic root, measuring 40 mm. There is mild dilatation of the ascending aorta, measuring 41 mm. Venous: The inferior vena cava was not well visualized. IAS/Shunts: The interatrial septum was not well visualized.  LEFT VENTRICLE PLAX 2D LVIDd:         5.40 cm Diastology LVIDs:         4.60 cm LV e' medial:    6.53 cm/s LV PW:         1.40 cm LV E/e' medial:  15.5 LV IVS:        1.30 cm LV e' lateral:   7.00 cm/s                        LV E/e' lateral: 14.4  RIGHT VENTRICLE RV Basal diam:  4.00 cm LEFT ATRIUM             Index        RIGHT ATRIUM           Index LA diam:        3.80 cm 1.62 cm/m   RA Area:     16.90 cm LA Vol (A2C):   71.7 ml 30.62 ml/m  RA Volume:   46.30 ml  19.77 ml/m LA Vol (A4C):   50.3 ml 21.48 ml/m LA Biplane Vol: 64.4 ml 27.50 ml/m  AORTIC VALVE                   PULMONIC VALVE AV Vmax:           150.00 cm/s PV Vmax:          0.99 m/s AV Vmean:          99.900 cm/s PV Peak grad:     3.9 mmHg AV VTI:  0.336 m     PR End Diast Vel: 5.57 msec AV Peak Grad:      9.0 mmHg AV Mean Grad:      5.0 mmHg LVOT Vmax:         120.00 cm/s LVOT Vmean:        80.000 cm/s LVOT VTI:          0.266 m LVOT/AV VTI ratio: 0.79  AORTA Ao Root diam: 4.00 cm Ao Asc diam:  4.10 cm MITRAL VALVE                TRICUSPID VALVE MV Area (PHT): 3.37 cm     TR Peak grad:   30.9 mmHg MV Decel Time: 225 msec     TR Vmax:        278.00 cm/s MV E velocity: 101.00 cm/s MV A velocity: 76.50 cm/s   SHUNTS MV E/A ratio:  1.32         Systemic VTI: 0.27 m Rachelle Hora Croitoru MD Electronically signed by Thurmon Fair MD Signature Date/Time: 02/22/2023/3:23:51 PM    Final    US Venous Img Lower Unilateral Right Result Date: 02/21/2023 CLINICAL DATA:  Right lower extremity pain and edema for the past 2 weeks. Shortness of breath with exertion. Evaluate for DVT. EXAM: RIGHT LOWER EXTREMITY VENOUS DOPPLER ULTRASOUND TECHNIQUE: Gray-scale sonography with graded compression, as well as color Doppler and  duplex ultrasound were performed to evaluate the lower extremity deep venous systems from the level of the common femoral vein and including the common femoral, femoral, profunda femoral, popliteal and calf veins including the posterior tibial, peroneal and gastrocnemius veins when visible. The superficial great saphenous vein was also interrogated. Spectral Doppler was utilized to evaluate flow at rest and with distal augmentation maneuvers in the common femoral, femoral and popliteal veins. COMPARISON:  None Available. FINDINGS: Contralateral Common Femoral Vein: Respiratory phasicity is normal and symmetric with the symptomatic side. No evidence of thrombus. Normal compressibility. Common Femoral Vein: No evidence of thrombus. Normal compressibility, respiratory phasicity and response to augmentation. Saphenofemoral Junction: No evidence of thrombus. Normal compressibility and flow on color Doppler imaging. Profunda Femoral Vein: No evidence of thrombus. Normal compressibility and flow on color Doppler imaging. Femoral Vein: No evidence of thrombus. Normal compressibility, respiratory phasicity and response to augmentation. Popliteal Vein: No evidence of thrombus. Normal compressibility, respiratory phasicity and response to augmentation. Calf Veins: No evidence of thrombus. Normal compressibility and flow on color Doppler imaging. Superficial Great Saphenous Vein: No evidence of thrombus. Normal compressibility. Other Findings:  None. IMPRESSION: No evidence of DVT within the right lower extremity. Electronically Signed   By: Simonne Come M.D.   On: 02/21/2023 11:42   DG Chest 2 View Result Date: 02/21/2023 CLINICAL DATA:  Shortness of breath. EXAM: CHEST - 2 VIEW COMPARISON:  04/17/2022. FINDINGS: Bilateral lung fields are clear. Bilateral costophrenic angles are clear. Stable cardio-mediastinal silhouette. Median sternotomy noted. No acute osseous abnormalities. The soft tissues are within normal limits.  IMPRESSION: *No active cardiopulmonary disease. Electronically Signed   By: Jules Schick M.D.   On: 02/21/2023 10:40    Microbiology: Results for orders placed or performed during the hospital encounter of 02/21/23  Surgical PCR screen     Status: None   Collection Time: 02/24/23 11:17 AM   Specimen: Nasal Mucosa; Nasal Swab  Result Value Ref Range Status   MRSA, PCR NEGATIVE NEGATIVE Final   Staphylococcus aureus NEGATIVE NEGATIVE Final    Comment: (NOTE) The Xpert  SA Assay (FDA approved for NASAL specimens in patients 41 years of age and older), is one component of a comprehensive surveillance program. It is not intended to diagnose infection nor to guide or monitor treatment. Performed at [facility], [address] [place], [place] [address]     Labs: CBC: Recent Labs  Lab 02/21/23 1020 02/22/23 0436 02/23/23 0454 02/24/23 0246  WBC 5.9 5.6 5.0 6.9  HGB 14.6 13.5 13.6 13.2  HCT 41.7 41.2 41.2 39.7  MCV 89.5 93.6 92.2 89.6  PLT 170 155 150 159   Basic Metabolic Panel: Recent Labs  Lab 02/21/23 1020 02/22/23 0436 02/23/23 0454 02/24/23 0246 02/25/23 0239  NA 138 138 134* 138 136  K 4.1 4.1 3.5 4.0 3.9  CL 104 106 102 108 106  CO2 24 22 23 23 22   GLUCOSE 107* 100* 111* 103* 105*  BUN 22 25* 29* 28* 24*  CREATININE 1.18 1.12 1.39* 1.24 1.16  CALCIUM 9.4 9.2 9.3 9.3 9.1  MG  --  2.3  --   --   --    Liver Function Tests: Recent Labs  Lab 02/21/23 1020 02/22/23 0436  AST 19 20  ALT 13 16  ALKPHOS 63 53  BILITOT 2.3* 2.4*  PROT 7.5 7.0  ALBUMIN 4.5 4.0   CBG: No results for input(s): "GLUCAP" in the last 168 hours.  Discharge time spent: greater than 30 minutes.  Signed: Coralie Keens, MD [facility] 02/25/2023

## 2023-02-28 ENCOUNTER — Other Ambulatory Visit (HOSPITAL_BASED_OUTPATIENT_CLINIC_OR_DEPARTMENT_OTHER): Payer: Self-pay

## 2023-02-28 ENCOUNTER — Other Ambulatory Visit: Payer: Self-pay

## 2023-02-28 MED FILL — Midazolam HCl Inj 2 MG/2ML (Base Equivalent): INTRAMUSCULAR | Qty: 2 | Status: AC

## 2023-03-03 ENCOUNTER — Telehealth: Payer: Self-pay | Admitting: Cardiology

## 2023-03-03 NOTE — Telephone Encounter (Signed)
Pt would like to know if he can start doing light driving before the 91YN. Please advise

## 2023-03-03 NOTE — Telephone Encounter (Signed)
Patient advised not to drive due to new system. I did change device clinic apt to 03/07/23 @ 3:00 PM so patient may be able to drive sooner d/t transportation issues. Pt was appreciative of assistance.

## 2023-03-03 NOTE — Telephone Encounter (Signed)
Spoke with pt, he has bad cabin fever and would like to know if he can start driving. He is having no problems and will place a pillow under the seat belt. He just wants to go 3 miles. He was told not to drive until his 16/10 appointment and would like to leave the house sooner if able. Aware will forward to the device folks for their recommendations. His pacer was placed 02/25/23.

## 2023-03-07 ENCOUNTER — Ambulatory Visit: Payer: Medicare Other | Attending: Internal Medicine

## 2023-03-07 DIAGNOSIS — R001 Bradycardia, unspecified: Secondary | ICD-10-CM

## 2023-03-07 LAB — CUP PACEART INCLINIC DEVICE CHECK
Battery Remaining Longevity: 118 mo
Battery Voltage: 2.98 V
Brady Statistic RA Percent Paced: 75 %
Brady Statistic RV Percent Paced: 99 %
Date Time Interrogation Session: 20241223155255
Implantable Lead Connection Status: 753985
Implantable Lead Connection Status: 753985
Implantable Lead Implant Date: 20241212
Implantable Lead Implant Date: 20241212
Implantable Lead Location: 753859
Implantable Lead Location: 753860
Implantable Pulse Generator Implant Date: 20241212
Lead Channel Impedance Value: 462.5 Ohm
Lead Channel Impedance Value: 487.5 Ohm
Lead Channel Pacing Threshold Amplitude: 0.75 V
Lead Channel Pacing Threshold Amplitude: 0.75 V
Lead Channel Pacing Threshold Amplitude: 1 V
Lead Channel Pacing Threshold Amplitude: 1 V
Lead Channel Pacing Threshold Pulse Width: 0.5 ms
Lead Channel Pacing Threshold Pulse Width: 0.5 ms
Lead Channel Pacing Threshold Pulse Width: 0.5 ms
Lead Channel Pacing Threshold Pulse Width: 0.5 ms
Lead Channel Sensing Intrinsic Amplitude: 12 mV
Lead Channel Sensing Intrinsic Amplitude: 3.9 mV
Lead Channel Setting Pacing Amplitude: 0.875
Lead Channel Setting Pacing Amplitude: 1.875
Lead Channel Setting Pacing Pulse Width: 0.5 ms
Lead Channel Setting Sensing Sensitivity: 2 mV
Pulse Gen Model: 2272
Pulse Gen Serial Number: 8224079

## 2023-03-07 NOTE — Patient Instructions (Signed)
After Your Pacemaker   Monitor your pacemaker site for redness, swelling, and drainage. Call the device clinic at [phone] if you experience these symptoms or fever/chills.  Your incision was closed with Steri-strips or staples:  You may shower 7 days after your procedure and wash your incision with soap and water. Avoid lotions, ointments, or perfumes over your incision until it is well-healed.  You may use a hot tub or a pool after your wound check appointment if the incision is completely closed.  Do not lift, push or pull greater than 10 pounds with the affected arm until 6 weeks after your procedure. UNTIL AFTER JANUARY 23RD. There are no other restrictions in arm movement after your wound check appointment.  You may drive, unless driving has been restricted by your healthcare providers.   Remote monitoring is used to monitor your pacemaker from home. This monitoring is scheduled every 91 days by our office. It allows [place] to keep an eye on the functioning of your device to ensure it is working properly. You will routinely see your Electrophysiologist annually (more often if necessary).

## 2023-03-07 NOTE — Progress Notes (Signed)

## 2023-03-10 ENCOUNTER — Ambulatory Visit: Payer: Medicare Other | Admitting: Pulmonary Disease

## 2023-03-14 ENCOUNTER — Telehealth: Payer: Self-pay

## 2023-03-14 ENCOUNTER — Other Ambulatory Visit (HOSPITAL_BASED_OUTPATIENT_CLINIC_OR_DEPARTMENT_OTHER): Payer: Self-pay

## 2023-03-14 DIAGNOSIS — I5033 Acute on chronic diastolic (congestive) heart failure: Secondary | ICD-10-CM

## 2023-03-14 MED ORDER — FUROSEMIDE 20 MG PO TABS
20.0000 mg | ORAL_TABLET | Freq: Every day | ORAL | 1 refills | Status: DC | PRN
  Filled 2023-03-14: qty 30, 30d supply, fill #0

## 2023-03-14 NOTE — Telephone Encounter (Signed)
Triage team - please call to ensure he is taking meds as prescribed. Amlodipine and Hydralazine were stopped during 02/21/23 hospitalization due to hypotension. Present antihypertensive regimen includes Losartan 50mg  daily. He was given Lasix while hospitalized with good diuretic effect.  For dyspnea, weight gain can Rx Furosemide 20mg  daily x 3 days then PRN for weight gain of 2 lbs overnight or 5 lbs in one week. Recommend BMP/BNP Friday or Monday for monitoring.   Important to weigh daily in the morning and keep a log.   Bring BP log and weight log to upcoming OV with Dr. Cristal Deer.   Alver Sorrow, NP

## 2023-03-14 NOTE — Telephone Encounter (Signed)
Call received to device clinic from Pt.  Per Pt-he has had a recent increase in weight by 4 pounds.  He is SOB with minimal activity and he states his BP has been increasing as well.  States his systolic gets as high as 157.  He states that one of his medications was decreased to once a day, and thinks he needs to go back to twice a day.  Advised would forward to his primary cardiologist for review and advisement.

## 2023-03-14 NOTE — Telephone Encounter (Signed)
Pt notified of Caitlyn's recommendations. He states that his weight is up 6#. He will take lasix x3 days he will continue to weigh daily, he will bring logs to f/u appt as scheduled. He will return Friday ~ Monday for lab. New rx and lab entered.

## 2023-03-22 ENCOUNTER — Encounter (HOSPITAL_BASED_OUTPATIENT_CLINIC_OR_DEPARTMENT_OTHER): Payer: Self-pay | Admitting: Family Medicine

## 2023-03-22 ENCOUNTER — Other Ambulatory Visit (HOSPITAL_BASED_OUTPATIENT_CLINIC_OR_DEPARTMENT_OTHER): Payer: Self-pay

## 2023-03-22 ENCOUNTER — Ambulatory Visit (INDEPENDENT_AMBULATORY_CARE_PROVIDER_SITE_OTHER): Payer: Medicare Other | Admitting: Family Medicine

## 2023-03-22 VITALS — BP 125/77 | HR 64 | Temp 98.0°F | Ht 72.0 in | Wt 245.0 lb

## 2023-03-22 DIAGNOSIS — R0602 Shortness of breath: Secondary | ICD-10-CM

## 2023-03-22 MED ORDER — FUROSEMIDE 20 MG PO TABS
20.0000 mg | ORAL_TABLET | Freq: Two times a day (BID) | ORAL | 1 refills | Status: DC
  Filled 2023-03-22 – 2023-03-25 (×3): qty 30, 15d supply, fill #0

## 2023-03-22 NOTE — Progress Notes (Signed)
 Acute Office Visit  Subjective:    Patient ID: Jeff Johnson, male    DOB: 14-Feb-1946, 78 y.o.   MRN: [id number]  Chief Complaint  Patient presents with   Nasal Congestion    Head pressure   Cough    Green/yellow mucus.    Shortness of Breath    Jeff Johnson presents today for an acute visit with complaint of nasal congestion, cough, & shortness of breath.  Has been very weak since PPM placement and this cold isn't helping anything  Taking lasix  20mg  daily instead of PRN- feel less shob since surgery but does not know what not being short of breath feels like   Getting in/out of the car/bed causes him to get shob  Losartan  50mg   230 lbs when he left the hospital, has been weighing himself daily and checking his BPs   12/30 135/75   112/72     235 12/31 141/85    120/73    232 1/1  124/75       133/72    233 1/2   132/79      114/69    235 1/3    135/78     114/66    233 1/4     99/75      127/76    233 1/5    113/73           --       232 1/6    116/75       95/62    235 1/7    122/73         --         233  ROS: see HPI    Objective:    BP 125/77   Pulse 64   Temp 98 F (36.7 C) (Oral)   Ht 6' (1.829 m)   Wt 245 lb (111.1 kg)   BMI 33.23 kg/m   Physical Exam Vitals reviewed.  Constitutional:      Appearance: He is well-developed.  Cardiovascular:     Rate and Rhythm: Normal rate and regular rhythm.     Pulses: Normal pulses.     Heart sounds: Normal heart sounds.  Pulmonary:     Effort: Pulmonary effort is normal. No accessory muscle usage, prolonged expiration or respiratory distress.     Breath sounds: Normal breath sounds. No decreased breath sounds, wheezing, rhonchi or rales.  Musculoskeletal:     Right lower leg: No edema.     Left lower leg: No edema.  Neurological:     Mental Status: He is alert.  Psychiatric:        Mood and Affect: Mood normal.        Behavior: Behavior normal.    Assessment & Plan:   1. Shortness of breath  (Primary) Patient presents today with concerns of nasal congestion, cough and chronic shortness of breath.  Review of chart appears that he reached out to cardiology on 12/30 for dyspnea.  He was told to take furosemide  20 mg daily for 3 days and then as needed based on his weight. Patient in no acute distress and is well-appearing. Denies chest pain, current shortness of breath, lower extremity edema, vision changes, headaches. Cardiovascular exam with heart regular rate and rhythm. Normal heart sounds, no murmurs present. No lower extremity edema present. Lungs clear to auscultation bilaterally in all lung fields. Upcoming OV with Dr. Lonni next week. Will obtain BMP & BNP. Based on  lab results, would be reasonable to increase furosemide  to 20 mg twice daily.  - B Nat Peptide - Basic Metabolic Panel (BMET)  Return if symptoms worsen or fail to improve.  Evalene Arts, FNP

## 2023-03-22 NOTE — Patient Instructions (Addendum)
 Rx Furosemide  20mg  AM & 20mg  PM until cardiology visit for weight gain/shortness of breath. Will repeat BMP/BNP today for monitoring.   Vitamin Regimen:  Vitamin C 500mg  twice daily  Vitamin D  5000 units once daily  Zinc 50-75mg  once daily   Non-Medication Therapy:  Drink plenty of fluids, warm if possible.  A teaspoon of honey may help ease coughing symptoms.  Cough drops or hard candy for coughing.   Over the Counter Medication Therapy:  Use a cough expectorant such as guaifenesin (Mucinex) if recommended by your doctor for a wet, congested cough. If you have high blood pressure, please ask your doctor first before using this.  If you have high blood pressure, medication such as Coricidin HBP is safe to take for your cough and will not increase your blood pressure.

## 2023-03-23 LAB — BASIC METABOLIC PANEL
BUN/Creatinine Ratio: 14 (ref 10–24)
BUN: 17 mg/dL (ref 8–27)
CO2: 23 mmol/L (ref 20–29)
Calcium: 9.3 mg/dL (ref 8.6–10.2)
Chloride: 102 mmol/L (ref 96–106)
Creatinine, Ser: 1.2 mg/dL (ref 0.76–1.27)
Glucose: 105 mg/dL — ABNORMAL HIGH (ref 70–99)
Potassium: 4.4 mmol/L (ref 3.5–5.2)
Sodium: 141 mmol/L (ref 134–144)
eGFR: 62 mL/min/{1.73_m2} (ref >59)

## 2023-03-23 LAB — BRAIN NATRIURETIC PEPTIDE: BNP: 146.4 pg/mL — ABNORMAL HIGH (ref 0.0–100.0)

## 2023-03-25 ENCOUNTER — Other Ambulatory Visit (HOSPITAL_BASED_OUTPATIENT_CLINIC_OR_DEPARTMENT_OTHER): Payer: Self-pay

## 2023-03-25 ENCOUNTER — Other Ambulatory Visit: Payer: Self-pay

## 2023-03-27 NOTE — Progress Notes (Signed)
 Cardiology Office Note:  .    Date:  03/28/2023  ID:  Dio, Giller Feb 09, 1946, MRN [id number] PCP: de [place], Quintin PARAS, MD  [facility] Providers Cardiologist:  Shelda Bruckner, MD     History of Present Illness: .    JESS TONEY is a 78 y.o. 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 He was initially seen 08/12/2022 as a new consult at the request of Booker Darice SAUNDERS, FNP for the evaluation and management of resistant hypertension. He was previously an established patient with Dr. Alveta.    Today: Called 03/14/23 with weight gain 4 lbs and DOE, with rise in his blood pressure. This was noted after admission for dyspnea in early December, had pacemaker implanted. His amlodipine  and hydralazine  were stopped that admission due to hypotension. Recommendation at the time of call was to verify med use, prescribed furosemide  20 mg daily for 3 days, then PRN. Instructed to bring BP and weight log to office visit. He then saw PCP 03/22/23, instructed to take lasix  BID until his cardiology appt 03/28/23. BNP 146, Cr 1.2, K 4.4  Breathing is still short with bending over or standing up. Has had a cold for two weeks as well.   Was 235 lbs to 226 lbs on his home scale. Went from 260 lbs to 245 lbs on office scales. Started noticing fluid coming off within a few days. Brings a log with BP and weights today. Most BP well controlled, but a few low (99/75, 89/66, 95/62). His normal BP is around 110 systolic. Has not had any syncope. Feels that he makes good urine on 20 mg lasix  dose, but urinary stream is not strong.  He feels that his weight should be about 220 lbs on his home scale. Was 249 at office visit 09/2022 (was euvolemic by exam at that time). Discussed food, salt avoidance. Does eat fast food.  No near syncope or syncope since pacemaker  implanted. Reviewed echo from that hospitalization as well.  ROS:  Denies chest pain. No PND, orthopnea. No syncope or palpitations. ROS otherwise negative except as noted.   Studies Reviewed: SABRA       No new  Physical Exam:    VS:  BP (!) 117/58 (BP Location: Left Arm, Patient Position: Sitting, Cuff Size: Normal)   Pulse 63   Ht 6' (1.829 m)   Wt 245 lb (111.1 kg)   SpO2 98%   BMI 33.23 kg/m    Wt Readings from Last 3 Encounters:  03/28/23 245 lb (111.1 kg)  03/22/23 245 lb (111.1 kg)  02/25/23 239 lb 6.4 oz (108.6 kg)    GEN: Well nourished, well developed in no acute distress HEENT: Normal, moist mucous membranes NECK: No JVD CARDIAC: regular rhythm, normal S1 and S2, no rubs or gallops. 2/6 murmur. VASCULAR: Radial and DP pulses 2+ bilaterally. No carotid bruits RESPIRATORY:  Clear to auscultation without rales, wheezing or rhonchi  ABDOMEN: Soft, non-tender, non-distended MUSCULOSKELETAL:  Ambulates independently SKIN: Warm and dry, no edema NEUROLOGIC:  Alert and oriented x 3. No focal neuro deficits noted. PSYCHIATRIC:  Normal affect   ASSESSMENT AND PLAN: .    Dyspnea on exertion Chronic diastolic heart failure -euvolemic on exam today -down 10-15 lbs (based on which scale) -intermittent low BP -will change lasix  to 20 mg daily (was on BID for a week) -recent BMET ok,  BNP only slightly elevated (prior to being on BID dose) -instructed on continuing to monitor daily weights -recent echo from 02/2023 with normal LVEF, G2DD, elevated LAP, PASP 36 mmHg, RAP not visualized  Hypertension -with history of lability -amlodipine  and hydralazine  stopped during admission 02/2023, now on losartan  and furosemide  alone   S/P AVR with aortic root repair -continue to monitor symptoms -continue aspirin  -needs antibiotic prophylaxis prior to dental procedures   Mixed hyperlipidemia -continue simvastatin   Symptomatic bradycardia/intermittent high grade AV block -now s/p  PPM 02/2023   Cardiac risk counseling and prevention recommendations: -recommend heart healthy/Mediterranean diet, with whole grains, fruits, vegetable, fish, lean meats, nuts, and olive oil. Limit salt. -recommend moderate walking, 3-5 times/week for 30-50 minutes each session. Aim for at least 150 minutes.week. Goal should be pace of 3 miles/hours, or walking 1.5 miles in 30 minutes -recommend avoidance of tobacco products. Avoid excess alcohol .  Dispo: Follow-up in 6 weeks with me or Reche Finder, NP  Signed, Shelda Bruckner, MD

## 2023-03-28 ENCOUNTER — Ambulatory Visit (HOSPITAL_BASED_OUTPATIENT_CLINIC_OR_DEPARTMENT_OTHER): Payer: Medicare Other | Admitting: Cardiology

## 2023-03-28 ENCOUNTER — Encounter (HOSPITAL_BASED_OUTPATIENT_CLINIC_OR_DEPARTMENT_OTHER): Payer: Self-pay | Admitting: Cardiology

## 2023-03-28 ENCOUNTER — Other Ambulatory Visit (HOSPITAL_BASED_OUTPATIENT_CLINIC_OR_DEPARTMENT_OTHER): Payer: Self-pay

## 2023-03-28 VITALS — BP 117/58 | HR 63 | Ht 72.0 in | Wt 245.0 lb

## 2023-03-28 DIAGNOSIS — Z9889 Other specified postprocedural states: Secondary | ICD-10-CM

## 2023-03-28 DIAGNOSIS — I10 Essential (primary) hypertension: Secondary | ICD-10-CM

## 2023-03-28 DIAGNOSIS — I5032 Chronic diastolic (congestive) heart failure: Secondary | ICD-10-CM

## 2023-03-28 DIAGNOSIS — R0602 Shortness of breath: Secondary | ICD-10-CM | POA: Diagnosis not present

## 2023-03-28 DIAGNOSIS — Z952 Presence of prosthetic heart valve: Secondary | ICD-10-CM | POA: Diagnosis not present

## 2023-03-28 DIAGNOSIS — E782 Mixed hyperlipidemia: Secondary | ICD-10-CM

## 2023-03-28 MED ORDER — FUROSEMIDE 20 MG PO TABS
20.0000 mg | ORAL_TABLET | Freq: Every day | ORAL | 3 refills | Status: DC
  Filled 2023-03-28 – 2023-03-29 (×3): qty 90, 90d supply, fill #0

## 2023-03-28 NOTE — Patient Instructions (Signed)
 Take lasix 20 mg once daily. Call if weight or breathing worsens. Come back and see me or Caitlin in 6 weeks.

## 2023-03-29 ENCOUNTER — Other Ambulatory Visit (HOSPITAL_BASED_OUTPATIENT_CLINIC_OR_DEPARTMENT_OTHER): Payer: Self-pay | Admitting: Cardiology

## 2023-03-29 ENCOUNTER — Telehealth (HOSPITAL_BASED_OUTPATIENT_CLINIC_OR_DEPARTMENT_OTHER): Payer: Self-pay | Admitting: Cardiology

## 2023-03-29 ENCOUNTER — Other Ambulatory Visit (HOSPITAL_BASED_OUTPATIENT_CLINIC_OR_DEPARTMENT_OTHER): Payer: Self-pay

## 2023-03-29 DIAGNOSIS — I5032 Chronic diastolic (congestive) heart failure: Secondary | ICD-10-CM

## 2023-03-29 MED ORDER — FUROSEMIDE 20 MG PO TABS: 20.0000 mg | ORAL_TABLET | Freq: Two times a day (BID) | ORAL | Status: DC

## 2023-03-29 MED ORDER — FUROSEMIDE 20 MG PO TABS
20.0000 mg | ORAL_TABLET | Freq: Two times a day (BID) | ORAL | 3 refills | Status: DC
  Filled 2023-03-29: qty 180, 90d supply, fill #0

## 2023-03-29 NOTE — Telephone Encounter (Signed)
 Called and spoke to patient.  - cut down lasix  from BID to every day (20mg ) - issues urinating - increase of 3.6 lbs overnight  - pt feels like he isn't urinating enough compared to BID lasix  - pt states he drinks an adequate amount of fluids - denies extra sodium in his diet  Made MD aware of info obtained. Will have pt resume Lasix  BID and obtain BMP in a week to f/u on kidney functions.

## 2023-03-29 NOTE — Telephone Encounter (Signed)
 Medication just changed yesterday. Recommend skip evening dose today.   If weight up additional 2 lbs tomorrow morning or 5 lbs from baseline weight in the morning, take 40mg  instead of 20mg . Otherwise continue 20mg  daily taking only evening 20mg  as needed for weight gain of 3 lbs overnight or 5 lbs in one week. Be sure to weigh first thing int he morning at same time each day.   Juna Caban S Donna Silverman, NP

## 2023-03-29 NOTE — Telephone Encounter (Signed)
 Pt stopped by [facility] front desk today to report that since cutting the Lasix  dosage in half patient's weight has gone up 3.5 pounds and having difficulty urinating. Pt did not take an evening dose but did in the morning of 03/29/23. Pt reports AM BP 127/76. Patient would like a call or a text to follow up before taking an evening dose.

## 2023-03-29 NOTE — Telephone Encounter (Signed)
 APP responded at same time as phone call.

## 2023-03-31 ENCOUNTER — Other Ambulatory Visit (HOSPITAL_BASED_OUTPATIENT_CLINIC_OR_DEPARTMENT_OTHER): Payer: Self-pay

## 2023-03-31 ENCOUNTER — Other Ambulatory Visit (HOSPITAL_BASED_OUTPATIENT_CLINIC_OR_DEPARTMENT_OTHER): Payer: Self-pay | Admitting: Family Medicine

## 2023-03-31 ENCOUNTER — Other Ambulatory Visit: Payer: Self-pay

## 2023-03-31 DIAGNOSIS — I71019 Dissection of thoracic aorta, unspecified: Secondary | ICD-10-CM

## 2023-03-31 MED ORDER — TAMSULOSIN HCL 0.4 MG PO CAPS
0.4000 mg | ORAL_CAPSULE | Freq: Every day | ORAL | 6 refills | Status: DC
  Filled 2023-03-31: qty 90, 90d supply, fill #0
  Filled 2023-06-25 (×2): qty 90, 90d supply, fill #1
  Filled 2023-09-21: qty 30, 30d supply, fill #2

## 2023-03-31 MED ORDER — SIMVASTATIN 20 MG PO TABS
20.0000 mg | ORAL_TABLET | Freq: Every day | ORAL | 3 refills | Status: DC
  Filled 2023-03-31: qty 90, 90d supply, fill #0
  Filled 2023-06-25 (×2): qty 30, 30d supply, fill #1

## 2023-03-31 MED ORDER — MONTELUKAST SODIUM 10 MG PO TABS
10.0000 mg | ORAL_TABLET | Freq: Every day | ORAL | 3 refills | Status: DC
  Filled 2023-03-31: qty 90, 90d supply, fill #0
  Filled 2023-06-30: qty 30, 30d supply, fill #1

## 2023-04-01 ENCOUNTER — Ambulatory Visit (HOSPITAL_BASED_OUTPATIENT_CLINIC_OR_DEPARTMENT_OTHER): Payer: Medicare Other | Admitting: Family Medicine

## 2023-04-06 ENCOUNTER — Other Ambulatory Visit (HOSPITAL_BASED_OUTPATIENT_CLINIC_OR_DEPARTMENT_OTHER): Payer: Self-pay

## 2023-04-06 DIAGNOSIS — I5032 Chronic diastolic (congestive) heart failure: Secondary | ICD-10-CM | POA: Diagnosis not present

## 2023-04-06 MED ORDER — CAPVAXIVE 0.5 ML IM SOSY
0.5000 mL | PREFILLED_SYRINGE | Freq: Once | INTRAMUSCULAR | 0 refills | Status: AC
  Filled 2023-04-06: qty 0.5, 1d supply, fill #0

## 2023-04-07 ENCOUNTER — Encounter (HOSPITAL_BASED_OUTPATIENT_CLINIC_OR_DEPARTMENT_OTHER): Payer: Self-pay

## 2023-04-07 ENCOUNTER — Telehealth (HOSPITAL_BASED_OUTPATIENT_CLINIC_OR_DEPARTMENT_OTHER): Payer: Self-pay

## 2023-04-07 DIAGNOSIS — I5032 Chronic diastolic (congestive) heart failure: Secondary | ICD-10-CM

## 2023-04-07 LAB — BASIC METABOLIC PANEL
BUN/Creatinine Ratio: 11 (ref 10–24)
BUN: 15 mg/dL (ref 8–27)
CO2: 22 mmol/L (ref 20–29)
Calcium: 9.6 mg/dL (ref 8.6–10.2)
Chloride: 101 mmol/L (ref 96–106)
Creatinine, Ser: 1.32 mg/dL — ABNORMAL HIGH (ref 0.76–1.27)
Glucose: 109 mg/dL — ABNORMAL HIGH (ref 70–99)
Potassium: 3.8 mmol/L (ref 3.5–5.2)
Sodium: 142 mmol/L (ref 134–144)
eGFR: 55 mL/min/{1.73_m2} — ABNORMAL LOW (ref >59)

## 2023-04-07 NOTE — Telephone Encounter (Signed)
-----   Message from Alver Sorrow sent at 04/07/2023  2:21 PM EST ----- Kidney function shows slight decline from baseline.  Ensure staying well-hydrated.  Normal electrolytes.  Recommend repeat BMP in 2 to 3 weeks to ensure stable or improving.

## 2023-04-15 ENCOUNTER — Other Ambulatory Visit (HOSPITAL_BASED_OUTPATIENT_CLINIC_OR_DEPARTMENT_OTHER): Payer: Self-pay

## 2023-04-15 ENCOUNTER — Other Ambulatory Visit (HOSPITAL_BASED_OUTPATIENT_CLINIC_OR_DEPARTMENT_OTHER): Payer: Self-pay | Admitting: Family Medicine

## 2023-04-18 ENCOUNTER — Other Ambulatory Visit (HOSPITAL_BASED_OUTPATIENT_CLINIC_OR_DEPARTMENT_OTHER): Payer: Self-pay

## 2023-04-18 ENCOUNTER — Telehealth (HOSPITAL_BASED_OUTPATIENT_CLINIC_OR_DEPARTMENT_OTHER): Payer: Self-pay | Admitting: Family Medicine

## 2023-04-18 NOTE — Telephone Encounter (Signed)
Pt came in stating that he had some prescriptions sent to the pharmacy for cough and congestion he stated he still has a dry cough and is spitting out yellow mucus. He also stated the pharmacy said they sent a request to [place] for his medication but has not got a response please advise pt

## 2023-04-18 NOTE — Telephone Encounter (Signed)
Pt is requesting tessalon pearls please advise if you would like to send in medication

## 2023-04-19 ENCOUNTER — Other Ambulatory Visit (HOSPITAL_BASED_OUTPATIENT_CLINIC_OR_DEPARTMENT_OTHER): Payer: Self-pay

## 2023-04-19 MED ORDER — BENZONATATE 200 MG PO CAPS
200.0000 mg | ORAL_CAPSULE | Freq: Three times a day (TID) | ORAL | 0 refills | Status: DC | PRN
  Filled 2023-04-19: qty 45, 15d supply, fill #0

## 2023-04-19 NOTE — Telephone Encounter (Signed)
Pt notified with verbal understanding  °

## 2023-04-21 ENCOUNTER — Other Ambulatory Visit (HOSPITAL_BASED_OUTPATIENT_CLINIC_OR_DEPARTMENT_OTHER): Payer: Self-pay

## 2023-04-25 ENCOUNTER — Telehealth (HOSPITAL_BASED_OUTPATIENT_CLINIC_OR_DEPARTMENT_OTHER): Payer: Self-pay | Admitting: Cardiology

## 2023-04-25 ENCOUNTER — Encounter (HOSPITAL_BASED_OUTPATIENT_CLINIC_OR_DEPARTMENT_OTHER): Payer: Self-pay

## 2023-04-25 DIAGNOSIS — I5032 Chronic diastolic (congestive) heart failure: Secondary | ICD-10-CM | POA: Diagnosis not present

## 2023-04-25 NOTE — Telephone Encounter (Signed)
 Called and spoke to pt; aware of APP's recommendations.  Weight 2/2-2/5 230 ? 2/6-2/7 232 lbs ? 2/8 233 ? 2/9 234 ? 2/10 232. Recommend continue lasix  20mg  BID. If he has weight gain of 2 lbs overnight or 5 lbs in one week, he may take Lasix  40mg  AM and 20mg  PM that day then return to 20mg  BID.    Caitlin S Walker, NP

## 2023-04-25 NOTE — Telephone Encounter (Signed)
 Weight 2/2-2/5 230 ? 2/6-2/7 232 lbs ? 2/8 233 ? 2/9 234 ? 2/10 232. Recommend continue lasix  20mg  BID. If he has weight gain of 2 lbs overnight or 5 lbs in one week, he may take Lasix  40mg  AM and 20mg  PM that day then return to 20mg  BID.   Jeff Johnson S Verble Styron, NP

## 2023-04-25 NOTE — Telephone Encounter (Signed)
 Pt was onsite for blood work and dropped by the front desk to report a weight increase. Patient went from 230 on 2/5 to 232 on 2/6-2/7, 233 on 2/8, and 234 on 2/9. He gave us  a copy of his tracking sheet. Patient asked what the best way would be to notify the care team about this weight change and I advised using MyChart. Since patient was going to be onsite today for the blood draw, he decided to simply drop by the desk to report the change. Patient has an appt with Jerrold Morgan on 2/24. Walked the patient's log back to the nursing station for review by the clinical team.

## 2023-04-28 ENCOUNTER — Encounter (HOSPITAL_BASED_OUTPATIENT_CLINIC_OR_DEPARTMENT_OTHER): Payer: Self-pay

## 2023-04-28 LAB — BASIC METABOLIC PANEL
BUN/Creatinine Ratio: 16 (ref 10–24)
BUN: 18 mg/dL (ref 8–27)
CO2: 23 mmol/L (ref 20–29)
Calcium: 9.4 mg/dL (ref 8.6–10.2)
Chloride: 101 mmol/L (ref 96–106)
Creatinine, Ser: 1.16 mg/dL (ref 0.76–1.27)
Glucose: 103 mg/dL — ABNORMAL HIGH (ref 70–99)
Potassium: 4 mmol/L (ref 3.5–5.2)
Sodium: 143 mmol/L (ref 134–144)
eGFR: 64 mL/min/{1.73_m2} (ref >59)

## 2023-05-09 ENCOUNTER — Encounter (HOSPITAL_BASED_OUTPATIENT_CLINIC_OR_DEPARTMENT_OTHER): Payer: Self-pay | Admitting: Family

## 2023-05-09 ENCOUNTER — Ambulatory Visit (HOSPITAL_BASED_OUTPATIENT_CLINIC_OR_DEPARTMENT_OTHER): Payer: Medicare Other | Admitting: Family

## 2023-05-09 ENCOUNTER — Other Ambulatory Visit: Payer: Self-pay

## 2023-05-09 ENCOUNTER — Other Ambulatory Visit (HOSPITAL_BASED_OUTPATIENT_CLINIC_OR_DEPARTMENT_OTHER): Payer: Self-pay

## 2023-05-09 VITALS — BP 124/68 | HR 72 | Ht 72.0 in | Wt 246.4 lb

## 2023-05-09 DIAGNOSIS — E782 Mixed hyperlipidemia: Secondary | ICD-10-CM | POA: Diagnosis not present

## 2023-05-09 DIAGNOSIS — I5032 Chronic diastolic (congestive) heart failure: Secondary | ICD-10-CM

## 2023-05-09 DIAGNOSIS — Z952 Presence of prosthetic heart valve: Secondary | ICD-10-CM | POA: Diagnosis not present

## 2023-05-09 DIAGNOSIS — R0609 Other forms of dyspnea: Secondary | ICD-10-CM

## 2023-05-09 MED ORDER — FUROSEMIDE 20 MG PO TABS
20.0000 mg | ORAL_TABLET | Freq: Every day | ORAL | 1 refills | Status: DC
  Filled 2023-05-09: qty 90, 90d supply, fill #0

## 2023-05-09 MED ORDER — DAPAGLIFLOZIN PROPANEDIOL 10 MG PO TABS
10.0000 mg | ORAL_TABLET | Freq: Every day | ORAL | 5 refills | Status: DC
  Filled 2023-05-09 – 2023-05-21 (×3): qty 30, 30d supply, fill #0
  Filled 2023-06-20: qty 30, 30d supply, fill #1

## 2023-05-09 MED ORDER — DAPAGLIFLOZIN PROPANEDIOL 10 MG PO TABS: 10.0000 mg | ORAL_TABLET | Freq: Every day | ORAL | Status: DC

## 2023-05-09 NOTE — Patient Instructions (Addendum)
 Medication Instructions:   START Farxiga 10mg  daily This medication helps to prevent your body from holding onto fluid, helps your heart work more efficiently, and prevents kidney disease  CHANGE Furosemide to 20mg  daily You may take an additional tablet as needed for weight gain of 2 lbs overnight or 5 lbs in one week  *If you need a refill on your cardiac medications before your next appointment, please call your pharmacy*   Lab Work: Your physician recommends that you return for lab work in 1 week for BMP/BNP If you have labs (blood work) drawn today and your tests are completely normal, you will receive your results only by: MyChart Message (if you have MyChart) OR A paper copy in the mail If you have any lab test that is abnormal or we need to change your treatment, we will call you to review the results.   Follow-Up: At [facility], you and your health needs are our priority.  As part of our continuing mission to provide you with exceptional heart care, we have created designated Provider Care Teams.  These Care Teams include your primary Cardiologist (physician) and Advanced Practice Providers (APPs -  Physician Assistants and Nurse Practitioners) who all work together to provide you with the care you need, when you need it.  We recommend signing up for the patient portal called "MyChart".  Sign up information is provided on this After Visit Summary.  MyChart is used to connect with patients for Virtual Visits (Telemedicine).  Patients are able to view lab/test results, encounter notes, upcoming appointments, etc.  Non-urgent messages can be sent to your provider as well.   To learn more about what you can do with MyChart, go to [email or web address].    Your next appointment:   6-8 weeks  Provider:   Jodelle Red, MD or Gillian Shields, NP

## 2023-05-09 NOTE — Progress Notes (Signed)
 Cardiology Office Note:  .   Date:  05/09/2023  ID:  Jeff, Johnson 1945-12-03, MRN [id number] PCP: de [place], Buren Kos, MD  [facility] Providers Cardiologist:  Jodelle Red, MD    History of Present Illness: .   Jeff Johnson is a 78 y.o. male with hx of HTN, HLD, aortic valve disease s/p Bentall procedure, symptomatic bradycardia s/p PPM.  Previous patient of Dr. Elease Hashimoto having since established with Dr. Cristal Deer.   Last seen 03/28/2023 for exertional dyspnea.  Lasix changed to 20 mg daily as he was euvolemic.  Weight was down 10 to 15 pounds after twice daily dosing for a week.  However via subsequent phone calls it was increased to 20 mg twice daily as he had difficulty urinating and increased awake.  Presents today for follow up. Prior clinic weight 245 pounds.  Weight today 246 pounds. Goes out to eat more often than not. Reports he still feels tired and dyspneic with exertion with activities like making hte bed or walking from car to store. Some days are better somea re worse. 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 Weight at home 230-235 lbs.   ROS: Please see the history of present illness.    All other systems reviewed and are negative.   Studies Reviewed: .        Cardiac Studies & Procedures   ______________________________________________________________________________________________   STRESS TESTS  MYOCARDIAL PERFUSION IMAGING 10/20/2016  Narrative  Nuclear stress EF: 58%.  There was no ST segment deviation noted during stress.  The study is normal.  This is a low risk study.  The left ventricular ejection fraction is normal (55-65%).   ECHOCARDIOGRAM  ECHOCARDIOGRAM COMPLETE 02/22/2023  Narrative ECHOCARDIOGRAM REPORT    Patient Name:   Jeff Johnson Date of Exam: 02/22/2023 Medical Rec #:  [id number]     Height:       72.0 in Accession #:    [id number]    Weight:       249.8 lb Date of Birth:  03-20-1945     BSA:           2.342 m Patient Age:    66 years      BP:           129/69 mmHg Patient Gender: M             HR:           55 bpm. Exam Location:  Inpatient  Procedure: 2D Echo, Cardiac Doppler, Color Doppler and Intracardiac Opacification Agent  Indications:    DOE R06.00  History:        Patient has prior history of Echocardiogram examinations, most recent 02/13/2021. Aortic Valve Disease; Risk Factors:Former Smoker, Hypertension and Dyslipidemia. Aortic Valve: 27 mm Magna bioprosthetic valve is present in the aortic position. Procedure Date: 2011.  Sonographer:    Dondra Prader RVT RCS Referring Phys: [id number] TIMOTHY S OPYD   Sonographer Comments: Technically difficult study due to poor echo windows, suboptimal parasternal window, suboptimal apical window and suboptimal subcostal window. Image acquisition challenging due to patient body habitus. IMPRESSIONS   1. Left ventricular ejection fraction, by estimation, is 55 to 60%. The left ventricle has normal function. The left ventricle has no regional wall motion abnormalities. There is mild concentric left ventricular hypertrophy. Left ventricular diastolic parameters are consistent with Grade II diastolic dysfunction (pseudonormalization). Elevated left atrial pressure. 2. Right ventricular systolic function is normal. The right ventricular size  is normal. There is mildly elevated pulmonary artery systolic pressure. The estimated right ventricular systolic pressure is 35.9 mmHg. 3. Left atrial size was mildly dilated. 4. The mitral valve is normal in structure. No evidence of mitral valve regurgitation. No evidence of mitral stenosis. 5. The aortic valve has been repaired/replaced. There is mild calcification of the aortic valve. There is mild thickening of the aortic valve. Aortic valve regurgitation is not visualized. There is a 27 mm Magna bioprosthetic valve present in the aortic position. Procedure Date: 2011. Echo findings are consistent with  normal structure and function of the aortic valve prosthesis. Aortic valve mean gradient measures 5.0 mmHg. Aortic valve Vmax measures 1.50 m/s. 6. Previous ascending aorta Bentall repair. Aortic dilatation noted. There is mild dilatation of the aortic root, measuring 40 mm. There is mild dilatation of the ascending aorta, measuring 41 mm.  Comparison(s): Prior images reviewed side by side. The left ventricular diastolic function is significantly worse.  FINDINGS Left Ventricle: Left ventricular ejection fraction, by estimation, is 55 to 60%. The left ventricle has normal function. The left ventricle has no regional wall motion abnormalities. Definity contrast agent was given IV to delineate the left ventricular endocardial borders. The left ventricular internal cavity size was normal in size. There is mild concentric left ventricular hypertrophy. Left ventricular diastolic parameters are consistent with Grade II diastolic dysfunction (pseudonormalization). Elevated left atrial pressure.  Right Ventricle: The right ventricular size is normal. Right vetricular wall thickness was not well visualized. Right ventricular systolic function is normal. There is mildly elevated pulmonary artery systolic pressure. The tricuspid regurgitant velocity is 2.78 m/s, and with an assumed right atrial pressure of 5 mmHg, the estimated right ventricular systolic pressure is 35.9 mmHg.  Left Atrium: Left atrial size was mildly dilated.  Right Atrium: Right atrial size was normal in size.  Pericardium: There is no evidence of pericardial effusion.  Mitral Valve: The mitral valve is normal in structure. No evidence of mitral valve regurgitation. No evidence of mitral valve stenosis.  Tricuspid Valve: The tricuspid valve is normal in structure. Tricuspid valve regurgitation is mild.  Aortic Valve: The aortic valve has been repaired/replaced. There is mild calcification of the aortic valve. There is mild thickening  of the aortic valve. Aortic valve regurgitation is not visualized. Aortic valve mean gradient measures 5.0 mmHg. Aortic valve peak gradient measures 9.0 mmHg. There is a 27 mm Magna bioprosthetic valve present in the aortic position. Procedure Date: 2011. Echo findings are consistent with normal structure and function of the aortic valve prosthesis.  Pulmonic Valve: The pulmonic valve was grossly normal. Pulmonic valve regurgitation is mild. No evidence of pulmonic stenosis.  Aorta: Previous ascending aorta Bentall repair. Aortic dilatation noted, the aortic root was not well visualized and the aortic root is normal in size and structure. There is mild dilatation of the aortic root, measuring 40 mm. There is mild dilatation of the ascending aorta, measuring 41 mm.  Venous: The inferior vena cava was not well visualized.  IAS/Shunts: The interatrial septum was not well visualized.   LEFT VENTRICLE PLAX 2D LVIDd:         5.40 cm Diastology LVIDs:         4.60 cm LV e' medial:    6.53 cm/s LV PW:         1.40 cm LV E/e' medial:  15.5 LV IVS:        1.30 cm LV e' lateral:   7.00 cm/s LV E/e' lateral:  14.4   RIGHT VENTRICLE RV Basal diam:  4.00 cm  LEFT ATRIUM             Index        RIGHT ATRIUM           Index LA diam:        3.80 cm 1.62 cm/m   RA Area:     16.90 cm LA Vol (A2C):   71.7 ml 30.62 ml/m  RA Volume:   46.30 ml  19.77 ml/m LA Vol (A4C):   50.3 ml 21.48 ml/m LA Biplane Vol: 64.4 ml 27.50 ml/m AORTIC VALVE                   PULMONIC VALVE AV Vmax:           150.00 cm/s PV Vmax:          0.99 m/s AV Vmean:          99.900 cm/s PV Peak grad:     3.9 mmHg AV VTI:            0.336 m     PR End Diast Vel: 5.57 msec AV Peak Grad:      9.0 mmHg AV Mean Grad:      5.0 mmHg LVOT Vmax:         120.00 cm/s LVOT Vmean:        80.000 cm/s LVOT VTI:          0.266 m LVOT/AV VTI ratio: 0.79  AORTA Ao Root diam: 4.00 cm Ao Asc diam:  4.10 cm  MITRAL VALVE                 TRICUSPID VALVE MV Area (PHT): 3.37 cm     TR Peak grad:   30.9 mmHg MV Decel Time: 225 msec     TR Vmax:        278.00 cm/s MV E velocity: 101.00 cm/s MV A velocity: 76.50 cm/s   SHUNTS MV E/A ratio:  1.32         Systemic VTI: 0.27 m  Mihai Croitoru MD Electronically signed by Thurmon Fair MD Signature Date/Time: 02/22/2023/3:23:51 PM    Final          ______________________________________________________________________________________________      Risk Assessment/Calculations:             Physical Exam:   VS:  BP 124/68   Pulse 72   Ht 6' (1.829 m)   Wt 246 lb 6.4 oz (111.8 kg)   SpO2 99%   BMI 33.42 kg/m    Wt Readings from Last 3 Encounters:  05/09/23 246 lb 6.4 oz (111.8 kg)  03/28/23 245 lb (111.1 kg)  03/22/23 245 lb (111.1 kg)    GEN: Well nourished, well developed in no acute distress NECK: No JVD; No carotid bruits CARDIAC: RRR, no murmurs, rubs, gallops RESPIRATORY:  Clear to auscultation without rales, wheezing or rhonchi  ABDOMEN: Soft, non-tender, non-distended EXTREMITIES:  No edema; No deformity   ASSESSMENT AND PLAN: .    DOE / Diastolic dysfunction - Echo 02/2023 LVEF 55-60%, gr2dd. Still with exertional dyspnea. Start farxiga 10mg  daily. Reduce Lasix to 20mg  daily. BMP/BNP in 1 week.   HTN - BP well controlled. Continue current antihypertensive regimen Losartan 50mg  daily.  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 Continue SBE prophylaxis.  HLD - Continue Simvastatin 20mg  daily.  Symptomatic bradycardia/intermittent high-grade AV block s/p PPM-follows with EP       Dispo: follow up in 6-8 weeks  Signed, Alver Sorrow, NP

## 2023-05-10 ENCOUNTER — Other Ambulatory Visit (HOSPITAL_COMMUNITY): Payer: Self-pay

## 2023-05-10 ENCOUNTER — Telehealth: Payer: Self-pay | Admitting: Pharmacy Technician

## 2023-05-10 NOTE — Telephone Encounter (Signed)
 Pharmacy Patient Advocate Encounter  Insurance verification completed.   The patient is insured through  [employer]     Ran test claim for [place]. Currently a quantity of 30 is a 30 day supply and the co-pay is 420.00- (DEDUCTIBLE) .   Ran test claim for [place]. Currently a quantity of 30 is a 30 day supply and the co-pay is 420.00- (DEDUCTIBLE) .  This test claim was processed through [facility]- copay amounts may vary at other pharmacies due to pharmacy/plan contracts, or as the patient moves through the different stages of their insurance plan.

## 2023-05-10 NOTE — Telephone Encounter (Signed)
 Can we see if Jeff Johnson needs PA to explain cost?

## 2023-05-12 DIAGNOSIS — H524 Presbyopia: Secondary | ICD-10-CM | POA: Diagnosis not present

## 2023-05-14 ENCOUNTER — Encounter (HOSPITAL_BASED_OUTPATIENT_CLINIC_OR_DEPARTMENT_OTHER): Payer: Self-pay | Admitting: Family

## 2023-05-16 ENCOUNTER — Other Ambulatory Visit (HOSPITAL_BASED_OUTPATIENT_CLINIC_OR_DEPARTMENT_OTHER): Payer: Self-pay

## 2023-05-16 DIAGNOSIS — I5032 Chronic diastolic (congestive) heart failure: Secondary | ICD-10-CM | POA: Diagnosis not present

## 2023-05-18 LAB — BASIC METABOLIC PANEL
BUN/Creatinine Ratio: 12 (ref 10–24)
BUN: 15 mg/dL (ref 8–27)
CO2: 21 mmol/L (ref 20–29)
Calcium: 9.9 mg/dL (ref 8.6–10.2)
Chloride: 103 mmol/L (ref 96–106)
Creatinine, Ser: 1.23 mg/dL (ref 0.76–1.27)
Glucose: 95 mg/dL (ref 70–99)
Potassium: 4.1 mmol/L (ref 3.5–5.2)
Sodium: 140 mmol/L (ref 134–144)
eGFR: 60 mL/min/{1.73_m2} (ref >59)

## 2023-05-18 LAB — BRAIN NATRIURETIC PEPTIDE: BNP: 110.8 pg/mL — ABNORMAL HIGH (ref 0.0–100.0)

## 2023-05-19 ENCOUNTER — Encounter (HOSPITAL_BASED_OUTPATIENT_CLINIC_OR_DEPARTMENT_OTHER): Payer: Self-pay

## 2023-05-21 ENCOUNTER — Other Ambulatory Visit (HOSPITAL_BASED_OUTPATIENT_CLINIC_OR_DEPARTMENT_OTHER): Payer: Self-pay

## 2023-05-27 ENCOUNTER — Ambulatory Visit (INDEPENDENT_AMBULATORY_CARE_PROVIDER_SITE_OTHER): Payer: Medicare Other

## 2023-05-27 DIAGNOSIS — I5032 Chronic diastolic (congestive) heart failure: Secondary | ICD-10-CM

## 2023-05-27 LAB — CUP PACEART REMOTE DEVICE CHECK
Battery Remaining Longevity: 118 mo
Battery Remaining Percentage: 95.5 %
Battery Voltage: 3.01 V
Brady Statistic AP VP Percent: 57 %
Brady Statistic AP VS Percent: 1 %
Brady Statistic AS VP Percent: 41 %
Brady Statistic AS VS Percent: 1 %
Brady Statistic RA Percent Paced: 55 %
Brady Statistic RV Percent Paced: 98 %
Date Time Interrogation Session: 20250314020017
Implantable Lead Connection Status: 753985
Implantable Lead Connection Status: 753985
Implantable Lead Implant Date: 20241212
Implantable Lead Implant Date: 20241212
Implantable Lead Location: 753859
Implantable Lead Location: 753860
Implantable Pulse Generator Implant Date: 20241212
Lead Channel Impedance Value: 460 Ohm
Lead Channel Impedance Value: 460 Ohm
Lead Channel Pacing Threshold Amplitude: 0.75 V
Lead Channel Pacing Threshold Amplitude: 0.75 V
Lead Channel Pacing Threshold Pulse Width: 0.5 ms
Lead Channel Pacing Threshold Pulse Width: 0.5 ms
Lead Channel Sensing Intrinsic Amplitude: 12 mV
Lead Channel Sensing Intrinsic Amplitude: 3.7 mV
Lead Channel Setting Pacing Amplitude: 1 V
Lead Channel Setting Pacing Amplitude: 1.75 V
Lead Channel Setting Pacing Pulse Width: 0.5 ms
Lead Channel Setting Sensing Sensitivity: 2 mV
Pulse Gen Model: 2272
Pulse Gen Serial Number: 8224079

## 2023-05-31 NOTE — Progress Notes (Unsigned)
  Electrophysiology Office Note:   Date:  06/01/2023  ID:  Jeff Johnson, DOB [birth year], MRN [id number]  Primary Cardiologist: Jodelle Red, MD Primary Heart Failure: None Electrophysiologist: Nobie Putnam, MD       History of Present Illness:   Jeff Johnson is a 78 y.o. 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   Since last being seen in our clinic the patient reports he has been doing well since his device was placed - healed well. He reports he noted one morning he had pin-prick sensation x3 that was fleeting. He thought it was related to his device but was not sure.  It has not occurred since.  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  He states he has baseline shortness of breath with exertion and he worries about having to get out of the car to address people loitering at times. He tries to walk 2K steps per day.   He denies chest pain, palpitations, dyspnea, PND, orthopnea, nausea, vomiting, dizziness, syncope, edema, weight gain, or early satiety.   Review of systems complete and found to be negative unless listed in HPI.   EP Information / Studies Reviewed:    EKG is not ordered today. EKG from 02/25/23 reviewed which showed AP, LBBB      PPM Interrogation-  reviewed in detail today,  See PACEART report.  Device History: Abbott Dual Chamber PPM implanted 02/24/2023 for Second Degree AV block  Studies:  ECHO 02/2023 > LVEF 55-60%, no RWMA, GIIDD, mildly elevated PA systolic, estimated RVSP 35.9, LA mildly dilated, AV repaired/replaced with 27 mm Magna bioprosthetic valve (2011), mild calcification of the AV   Arrhythmia / AAD Mobitz II AVB            Physical Exam:   VS:  BP 102/68   Pulse 64   Ht 6' (1.829 m)   Wt 246 lb (111.6 kg)   SpO2 98%   BMI  33.36 kg/m    Wt Readings from Last 3 Encounters:  06/01/23 246 lb (111.6 kg)  05/09/23 246 lb 6.4 oz (111.8 kg)  03/28/23 245 lb (111.1 kg)     GEN: elderly adult male, well nourished, well developed in no acute distress. Walks with a cane into room.  NECK: No JVD; No carotid bruits CARDIAC: Regular rate and rhythm, 2/6 SEM murmurs, rubs, gallops RESPIRATORY: Clear to auscultation without rales, wheezing or rhonchi  ABDOMEN: Soft, non-tender, non-distended EXTREMITIES:  No edema; No deformity    ASSESSMENT AND PLAN:    Symptomatic Bradycardia / Second Degree AV block II s/p Abbott PPM  -Normal PPM function -See Pace Art report  -No changes today -follow VP trend, may be able to change LRL to 50  or turn on MVP   AT  Seen on device, one 6 second episode  -monitor   Hypertension  -well controlled on current regimen   VHD: AV Bioprosthetic Replacement  AAA s/p Bentall Aortic Root Replacement  -per Cardiology  -euvolemic on exam / wt stable per pt   Disposition:   Follow up with Dr. Jimmey Ralph in 12 months  Signed, Canary Brim, NP-C, AGACNP-BC [facility] - Electrophysiology  06/01/2023, 11:45 AM

## 2023-06-01 ENCOUNTER — Encounter: Payer: Self-pay | Admitting: Pulmonary Disease

## 2023-06-01 ENCOUNTER — Ambulatory Visit: Payer: Medicare Other | Attending: Pulmonary Disease | Admitting: Pulmonary Disease

## 2023-06-01 VITALS — BP 102/68 | HR 64 | Ht 72.0 in | Wt 246.0 lb

## 2023-06-01 DIAGNOSIS — R001 Bradycardia, unspecified: Secondary | ICD-10-CM | POA: Diagnosis not present

## 2023-06-01 DIAGNOSIS — I441 Atrioventricular block, second degree: Secondary | ICD-10-CM

## 2023-06-01 DIAGNOSIS — Z950 Presence of cardiac pacemaker: Secondary | ICD-10-CM

## 2023-06-01 LAB — CUP PACEART INCLINIC DEVICE CHECK
Battery Remaining Longevity: 117 mo
Battery Voltage: 3.01 V
Brady Statistic RA Percent Paced: 56 %
Brady Statistic RV Percent Paced: 98 %
Date Time Interrogation Session: 20250319125330
Implantable Lead Connection Status: 753985
Implantable Lead Connection Status: 753985
Implantable Lead Implant Date: 20241212
Implantable Lead Implant Date: 20241212
Implantable Lead Location: 753859
Implantable Lead Location: 753860
Implantable Pulse Generator Implant Date: 20241212
Lead Channel Impedance Value: 487.5 Ohm
Lead Channel Impedance Value: 512.5 Ohm
Lead Channel Pacing Threshold Amplitude: 0.75 V
Lead Channel Pacing Threshold Amplitude: 0.75 V
Lead Channel Pacing Threshold Pulse Width: 0.5 ms
Lead Channel Pacing Threshold Pulse Width: 0.5 ms
Lead Channel Sensing Intrinsic Amplitude: 12 mV
Lead Channel Sensing Intrinsic Amplitude: 3.9 mV
Lead Channel Setting Pacing Amplitude: 1 V
Lead Channel Setting Pacing Amplitude: 1.75 V
Lead Channel Setting Pacing Pulse Width: 0.5 ms
Lead Channel Setting Sensing Sensitivity: 2 mV
Pulse Gen Model: 2272
Pulse Gen Serial Number: 8224079

## 2023-06-01 NOTE — Patient Instructions (Signed)
 Medication Instructions:  Your physician recommends that you continue on your current medications as directed. Please refer to the Current Medication list given to you today.  *If you need a refill on your cardiac medications before your next appointment, please call your pharmacy*  Lab Work: None ordered If you have labs (blood work) drawn today and your tests are completely normal, you will receive your results only by: MyChart Message (if you have MyChart) OR A paper copy in the mail If you have any lab test that is abnormal or we need to change your treatment, we will call you to review the results.  Follow-Up: At [facility], you and your health needs are our priority.  As part of our continuing mission to provide you with exceptional heart care, we have created designated Provider Care Teams.  These Care Teams include your primary Cardiologist (physician) and Advanced Practice Providers (APPs -  Physician Assistants and Nurse Practitioners) who all work together to provide you with the care you need, when you need it.  Your next appointment:   1 year(s)  Provider:   Nobie Putnam, MD or Canary Brim, NP

## 2023-06-08 ENCOUNTER — Encounter (HOSPITAL_BASED_OUTPATIENT_CLINIC_OR_DEPARTMENT_OTHER): Payer: Self-pay | Admitting: Family Medicine

## 2023-06-08 ENCOUNTER — Ambulatory Visit (INDEPENDENT_AMBULATORY_CARE_PROVIDER_SITE_OTHER): Payer: Medicare Other | Admitting: Family Medicine

## 2023-06-08 VITALS — BP 133/58 | HR 63 | Ht 72.0 in | Wt 247.0 lb

## 2023-06-08 DIAGNOSIS — M25551 Pain in right hip: Secondary | ICD-10-CM | POA: Diagnosis not present

## 2023-06-08 DIAGNOSIS — I1A0 Resistant hypertension: Secondary | ICD-10-CM | POA: Diagnosis not present

## 2023-06-08 NOTE — Assessment & Plan Note (Signed)
 Continues to have right hip pain.  At last visit, we did refer patient to physical therapy, however he did not arrange this and indicates that he does not have interest in doing so.  Currently, he does not feel that pain is significantly bothersome at this time. Has not had recent x-rays, discussed option.  Patient declines x-rays today.  He will continue with conservative measures and let us know if any new issues arise

## 2023-06-08 NOTE — Patient Instructions (Signed)

## 2023-06-08 NOTE — Progress Notes (Signed)
    Procedures performed today:    None.  Independent interpretation of notes and tests performed by another provider:   None.  Brief History, Exam, Impression, and Recommendations:    BP (!) 133/58 (BP Location: Left Arm, Patient Position: Sitting, Cuff Size: Large)   Pulse 63   Ht 6' (1.829 m)   Wt 247 lb (112 kg)   SpO2 100%   BMI 33.50 kg/m   Right hip pain Assessment & Plan: Continues to have right hip pain.  At last visit, we did refer patient to physical therapy, however he did not arrange this and indicates that he does not have interest in doing so.  Currently, he does not feel that pain is significantly bothersome at this time. Has not had recent x-rays, discussed option.  Patient declines x-rays today.  He will continue with conservative measures and let us know if any new issues arise   Resistant hypertension Assessment & Plan: Blood pressure has been improved, continues with medications as prescribed. He has been checking his blood pressures at home and brings log into the office today, blood pressures generally controlled at home - only occasional readings above 140 systolic, but most home readings are on the lower end of normal range. No issues with chest pain, headache, lightheadedness or dizziness. Recommend intermittent monitoring of blood pressure at home, DASH diet   Return in about 4 months (around 10/08/2023).   ___________________________________________ Aasiya Creasey de [place], MD, ABFM, CAQSM Primary Care and Sports Medicine [facility]

## 2023-06-08 NOTE — Assessment & Plan Note (Addendum)
 Blood pressure has been improved, continues with medications as prescribed. He has been checking his blood pressures at home and brings log into the office today, blood pressures generally controlled at home - only occasional readings above 140 systolic, but most home readings are on the lower end of normal range. No issues with chest pain, headache, lightheadedness or dizziness. Recommend intermittent monitoring of blood pressure at home, DASH diet

## 2023-06-13 ENCOUNTER — Telehealth: Payer: Self-pay

## 2023-06-13 NOTE — Telephone Encounter (Signed)
 FYI

## 2023-06-13 NOTE — Telephone Encounter (Signed)
 Device team - can we get an add on device check to check on his rhythm? TY!

## 2023-06-13 NOTE — Telephone Encounter (Signed)
 Bring in for EKG in nurse visit

## 2023-06-13 NOTE — Telephone Encounter (Addendum)
 AF in progress from 3/30, hx of AF noted in EPIC, ASA only.  Presenting AF/RVR - V rates 73 - 146bpm. No OAC (AF > 24 hours) new onset. - Refer to AF clinic, no in office appts anytime soon.   Patient is symptomatic with severe fatigue and palpitations. He is open to seeing the AF clinic.  Will forward to them for follow up and next steps.   CC: Dr. Jimmey Ralph

## 2023-06-14 ENCOUNTER — Ambulatory Visit (HOSPITAL_COMMUNITY)
Admission: RE | Admit: 2023-06-14 | Discharge: 2023-06-14 | Disposition: A | Discharge status: Home / Self Care | Source: Ambulatory Visit | Attending: Internal Medicine | Admitting: Internal Medicine

## 2023-06-14 ENCOUNTER — Other Ambulatory Visit (HOSPITAL_BASED_OUTPATIENT_CLINIC_OR_DEPARTMENT_OTHER): Payer: Self-pay

## 2023-06-14 VITALS — BP 108/66 | HR 85 | Ht 72.0 in | Wt 246.2 lb

## 2023-06-14 DIAGNOSIS — D6869 Other thrombophilia: Secondary | ICD-10-CM | POA: Insufficient documentation

## 2023-06-14 DIAGNOSIS — I714 Abdominal aortic aneurysm, without rupture, unspecified: Secondary | ICD-10-CM | POA: Insufficient documentation

## 2023-06-14 DIAGNOSIS — Z7982 Long term (current) use of aspirin: Secondary | ICD-10-CM | POA: Insufficient documentation

## 2023-06-14 DIAGNOSIS — I480 Paroxysmal atrial fibrillation: Secondary | ICD-10-CM | POA: Diagnosis not present

## 2023-06-14 DIAGNOSIS — Z8679 Personal history of other diseases of the circulatory system: Secondary | ICD-10-CM | POA: Diagnosis not present

## 2023-06-14 DIAGNOSIS — Z7901 Long term (current) use of anticoagulants: Secondary | ICD-10-CM | POA: Diagnosis not present

## 2023-06-14 DIAGNOSIS — Z79899 Other long term (current) drug therapy: Secondary | ICD-10-CM | POA: Diagnosis not present

## 2023-06-14 DIAGNOSIS — I509 Heart failure, unspecified: Secondary | ICD-10-CM | POA: Diagnosis not present

## 2023-06-14 DIAGNOSIS — I4891 Unspecified atrial fibrillation: Secondary | ICD-10-CM

## 2023-06-14 DIAGNOSIS — I110 Hypertensive heart disease with heart failure: Secondary | ICD-10-CM | POA: Insufficient documentation

## 2023-06-14 MED ORDER — APIXABAN 5 MG PO TABS
5.0000 mg | ORAL_TABLET | Freq: Two times a day (BID) | ORAL | 3 refills | Status: DC
  Filled 2023-06-14: qty 60, 30d supply, fill #0
  Filled 2023-07-15: qty 60, 30d supply, fill #1
  Filled 2023-08-08: qty 60, 30d supply, fill #2
  Filled 2023-09-06: qty 60, 30d supply, fill #3

## 2023-06-14 NOTE — Progress Notes (Signed)
 Primary Care Physician: de [place], Buren Kos, MD Primary Cardiologist: Jodelle Red, MD Electrophysiologist: Nobie Putnam, MD     Referring Physician: [facility] clinic alert     Jeff Johnson is a 78 y.o. male with a history of HLD, AAA, AI s/p AVR, symptomatic bradycardia, Mobitz II AV block s/p PPM, prostate cancer, chronic diastolic heart failure, HTN, BPH, and atrial fibrillation who presents for consultation in the [facility]. Device clinic alert on 3/31 for ongoing Afib with RVR since 3/30. Patient is on ASA 81 mg daily. He has a CHADS2VASC score of 4.  On evaluation today, he is currently in Afib. He feels possibly more SOB and tired than usual. He denies having any OSA due to frequent urination at night. He takes ASA 81 mg daily.    Today, he denies symptoms of palpitations, chest pain, orthopnea, PND, lower extremity edema, dizziness, presyncope, syncope, snoring, daytime somnolence, bleeding, or neurologic sequela. The patient is tolerating medications without difficulties and is otherwise without complaint today.   he has a BMI of Body mass index is 33.39 kg/m.Marland Kitchen Filed Weights   06/14/23 0855  Weight: 111.7 kg    Current Outpatient Medications  Medication Sig Dispense Refill   acetaminophen (TYLENOL) 650 MG CR tablet Take 1,300 mg by mouth 3 (three) times daily as needed for pain or fever.     apixaban (ELIQUIS) 5 MG TABS tablet Take 1 tablet (5 mg total) by mouth 2 (two) times daily. 60 tablet 3   ascorbic acid (VITAMIN C) 500 MG tablet Take 500 mg by mouth daily.     ASPIRIN 81 PO Take 1 tablet by mouth daily.     benzonatate (TESSALON) 200 MG capsule Take 1 capsule (200 mg total) by mouth 3 (three) times daily as needed for cough. 45 capsule 0   Cholecalciferol (VITAMIN D PO) Take 1 tablet by mouth daily. Patient takes 5,000 units daily     dapagliflozin propanediol (FARXIGA) 10 MG TABS tablet Take 1 tablet (10 mg total) by mouth daily  before breakfast. 30 tablet 5   furosemide (LASIX) 20 MG tablet Take 1 tablet (20 mg total) by mouth daily. 90 tablet 1   lansoprazole (PREVACID) 15 MG capsule Take 1 capsule (15 mg total) by mouth daily at 12 noon. 30 capsule 3   losartan (COZAAR) 50 MG tablet Take 1 tablet (50 mg total) by mouth daily. 90 tablet 1   montelukast (SINGULAIR) 10 MG tablet Take 1 tablet (10 mg total) by mouth daily. 30 tablet 3   Multiple Vitamins-Minerals (PRESERVISION AREDS 2) CAPS Take 1 capsule by mouth in the morning and at bedtime.     simvastatin (ZOCOR) 20 MG tablet Take 1 tablet (20 mg total) by mouth daily. 30 tablet 3   tamsulosin (FLOMAX) 0.4 MG CAPS capsule Take 1 capsule (0.4 mg total) by mouth daily. 30 capsule 6   zinc gluconate 50 MG tablet Take 50 mg by mouth daily. Patient takes 50/75 daily     No current facility-administered medications for this encounter.    Atrial Fibrillation Management history:  Previous antiarrhythmic drugs: none Previous cardioversions: none Previous ablations: none Anticoagulation history: none   ROS- All systems are reviewed and negative except as per the HPI above.  Physical Exam: BP 108/66   Pulse 85   Ht 6' (1.829 m)   Wt 111.7 kg   BMI 33.39 kg/m   GEN: Well nourished, well developed in no acute distress NECK:  No JVD; No carotid bruits CARDIAC: Irregularly irregular rate and rhythm, no murmurs, rubs, gallops RESPIRATORY:  Clear to auscultation without rales, wheezing or rhonchi  ABDOMEN: Soft, non-tender, non-distended EXTREMITIES:  No edema; No deformity   EKG today demonstrates  Vent. rate 85 BPM PR interval * ms QRS duration 98 ms QT/QTcB 390/464 ms P-R-T axes * -73 94 Undetermined rhythm Left axis deviation Inferior infarct , age undetermined Abnormal ECG When compared with ECG of 25-Feb-2023 05:29, PREVIOUS ECG IS PRESENT  Echo 02/22/23 demonstrated  1. Left ventricular ejection fraction, by estimation, is 55 to 60%. The  left  ventricle has normal function. The left ventricle has no regional  wall motion abnormalities. There is mild concentric left ventricular  hypertrophy. Left ventricular diastolic  parameters are consistent with Grade II diastolic dysfunction  (pseudonormalization). Elevated left atrial pressure.   2. Right ventricular systolic function is normal. The right ventricular  size is normal. There is mildly elevated pulmonary artery systolic  pressure. The estimated right ventricular systolic pressure is 35.9 mmHg.   3. Left atrial size was mildly dilated.   4. The mitral valve is normal in structure. No evidence of mitral valve  regurgitation. No evidence of mitral stenosis.   5. The aortic valve has been repaired/replaced. There is mild  calcification of the aortic valve. There is mild thickening of the aortic  valve. Aortic valve regurgitation is not visualized. 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 Procedure Date: 2011. Echo findings are consistent with normal  structure and function of the aortic valve prosthesis. Aortic valve mean  gradient measures 5.0 mmHg. Aortic valve Vmax measures 1.50 m/s.   6. Previous ascending aorta Bentall repair. Aortic dilatation noted.  There is mild dilatation of the aortic root, measuring 40 mm. There is  mild dilatation of the ascending aorta, measuring 41 mm.   ASSESSMENT & PLAN CHA2DS2-VASc Score = 4  The patient's score is based upon: CHF History: 1 HTN History: 1 Diabetes History: 0 Stroke History: 0 Vascular Disease History: 0 Age Score: 2 Gender Score: 0       ASSESSMENT AND PLAN: Paroxysmal Atrial Fibrillation (ICD10:  I48.0) The patient's CHA2DS2-VASc score is 4, indicating a 4.8% annual risk of stroke.    He is currently in Afib. Education provided about Afib. We discussed possible triggers for Afib. Discussion about medication treatments and ablation going forward if indicated. After discussion, we  will proceed with conservative observation at this time. Rhythm monitoring device recommended. We discussed the procedure cardioversion to try to convert to NSR. We discussed the risks vs benefits of this procedure and how ultimately we cannot predict whether a patient will have early return of arrhythmia post procedure. After discussion, the patient is interested in doing a cardioversion and understands he has to be on an [facility] for 3 weeks without interruption.     Secondary Hypercoagulable State (ICD10:  D68.69) The patient is at significant risk for stroke/thromboembolism based upon his CHA2DS2-VASc Score of 4.  Start Apixaban (Eliquis).  We discussed risks vs benefits of anticoagulation. After discussion, patient agrees to begin [facility] for prevention of stroke related to Afib. Begin Eliquis 5 mg BID. After discussion with primary cardiologist, patient will continue ASA 81 mg daily due to aortic valve replacement and aortic root replacement history.     Follow up 3 weeks.    Lake Bells, PA-C  Afib Clinic [facility] [address] [place],  [place] [address] [phone]

## 2023-06-14 NOTE — Patient Instructions (Signed)
 Start Eliquis 5mg  twice a day

## 2023-06-20 ENCOUNTER — Other Ambulatory Visit (HOSPITAL_BASED_OUTPATIENT_CLINIC_OR_DEPARTMENT_OTHER): Payer: Self-pay

## 2023-06-25 ENCOUNTER — Other Ambulatory Visit (HOSPITAL_BASED_OUTPATIENT_CLINIC_OR_DEPARTMENT_OTHER): Payer: Self-pay | Admitting: Family Medicine

## 2023-06-25 ENCOUNTER — Other Ambulatory Visit (HOSPITAL_BASED_OUTPATIENT_CLINIC_OR_DEPARTMENT_OTHER): Payer: Self-pay

## 2023-06-25 DIAGNOSIS — I1A0 Resistant hypertension: Secondary | ICD-10-CM

## 2023-06-25 DIAGNOSIS — I71019 Dissection of thoracic aorta, unspecified: Secondary | ICD-10-CM

## 2023-06-27 ENCOUNTER — Encounter (HOSPITAL_BASED_OUTPATIENT_CLINIC_OR_DEPARTMENT_OTHER): Payer: Self-pay | Admitting: Family

## 2023-06-27 ENCOUNTER — Other Ambulatory Visit (HOSPITAL_BASED_OUTPATIENT_CLINIC_OR_DEPARTMENT_OTHER): Payer: Self-pay

## 2023-06-27 ENCOUNTER — Encounter (HOSPITAL_BASED_OUTPATIENT_CLINIC_OR_DEPARTMENT_OTHER): Payer: Self-pay

## 2023-06-27 ENCOUNTER — Ambulatory Visit (HOSPITAL_BASED_OUTPATIENT_CLINIC_OR_DEPARTMENT_OTHER): Payer: Medicare Other | Admitting: Family

## 2023-06-27 VITALS — BP 100/54 | HR 68 | Ht 72.0 in | Wt 248.0 lb

## 2023-06-27 DIAGNOSIS — I480 Paroxysmal atrial fibrillation: Secondary | ICD-10-CM | POA: Diagnosis not present

## 2023-06-27 DIAGNOSIS — I5032 Chronic diastolic (congestive) heart failure: Secondary | ICD-10-CM | POA: Diagnosis not present

## 2023-06-27 DIAGNOSIS — D6859 Other primary thrombophilia: Secondary | ICD-10-CM | POA: Diagnosis not present

## 2023-06-27 MED ORDER — FUROSEMIDE 20 MG PO TABS
20.0000 mg | ORAL_TABLET | Freq: Every day | ORAL | 1 refills | Status: DC
  Filled 2023-06-27: qty 90, 90d supply, fill #0
  Filled 2023-09-19: qty 90, 90d supply, fill #1
  Filled 2023-11-28 – 2023-12-02 (×5): qty 90, 90d supply, fill #2

## 2023-06-27 MED ORDER — DAPAGLIFLOZIN PROPANEDIOL 10 MG PO TABS
10.0000 mg | ORAL_TABLET | Freq: Every day | ORAL | 2 refills | Status: AC
  Filled 2023-06-27 – 2023-07-16 (×2): qty 90, 90d supply, fill #0
  Filled 2023-07-16: qty 30, 30d supply, fill #0
  Filled 2023-07-16: qty 90, 90d supply, fill #0
  Filled 2023-08-12: qty 30, 30d supply, fill #1
  Filled 2023-09-12: qty 30, 30d supply, fill #2
  Filled 2023-10-13: qty 30, 30d supply, fill #3
  Filled 2023-11-12 (×2): qty 30, 30d supply, fill #4
  Filled 2023-12-12: qty 30, 30d supply, fill #5
  Filled 2024-01-09: qty 30, 30d supply, fill #6
  Filled 2024-02-06: qty 30, 30d supply, fill #7
  Filled 2024-03-07: qty 30, 30d supply, fill #8

## 2023-06-27 NOTE — Patient Instructions (Addendum)
 Medication Instructions:  Your physician recommends that you continue on your current medications as directed. Please refer to the Current Medication list given to you today.  *If you need a refill on your cardiac medications before your next appointment, please call your pharmacy*  Lab Work: Your physician recommends that you return for lab work today- BMP, BNP, MAG   Follow-Up: At [facility], you and your health needs are our priority.  As part of our continuing mission to provide you with exceptional heart care, our providers are all part of one team.  This team includes your primary Cardiologist (physician) and Advanced Practice Providers or APPs (Physician Assistants and Nurse Practitioners) who all work together to provide you with the care you need, when you need it.  Please follow up in 3-4 MONTHS with Dr. Veryl Gottron, Slater Duncan, NP or Neomi Banks, NP   We recommend signing up for the patient portal called "MyChart".  Sign up information is provided on this After Visit Summary.  MyChart is used to connect with patients for Virtual Visits (Telemedicine).  Patients are able to view lab/test results, encounter notes, upcoming appointments, etc.  Non-urgent messages can be sent to your provider as well.   To learn more about what you can do with MyChart, go to [email or web address].   Other Instructions Follow up as scheduled with Atrial Fib clinic to discuss long term plan for atrial fibrillation: Medications like Diltiazem to help reduce your heart rate Antiarrhythmics which are a group of medications to help keep your heart in a normal sinus rhythm Cardioversion which is a procedure to get you back into a normal rhythm if you are persistently in atrial fibrillation Ablation for a more permanent solution to atrial fibrillation

## 2023-06-27 NOTE — Progress Notes (Signed)
 Cardiology Office Note:  .   Date:  06/27/2023  ID:  Saurav, Crumble 22-Nov-1945, MRN [id number] PCP: de [place], Buren Kos, MD  [facility] Providers Cardiologist:  Jodelle Red, MD Electrophysiologist:  Nobie Putnam, MD    History of Present Illness: .   REEDY BIERNAT is a 78 y.o. male with hx of HTN, HLD, aortic valve disease s/p Bentall procedure, symptomatic bradycardia s/p PPM.  Previous patient of Dr. Elease Hashimoto having since established with Dr. Cristal Deer.   Last seen 03/28/2023 for exertional dyspnea.  Lasix changed to 20 mg daily as he was euvolemic.  Weight was down 10 to 15 pounds after twice daily dosing for a week.  However via subsequent phone calls it was increased to 20 mg twice daily as he had difficulty urinating and increased awake.  Visit 05/09/2023 Marcelline Deist 10mg  daily added with repeat BMP/BNP in 1 week with stable kidney function and BNP improved from 146 to 110.   06/01/23 seen by Canary Brim, NP for device check with normal PPM function. One 6 second episode of AT recommended for monitoring.   He saw AFib clinic 06/14/23 with atrial fib with RVR since 3/30 per device clinic report. Symptomatic with SOB and fatigue. follow up to discuss cardioversion scheduled. Eliquis initiated for [facility].   Presents today for follow up independently. 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  Notes A-fib frequency has decreased to about once every other day instead of previously 4-5 times per day notified by Apple Watch.  When he is in atrial fibrillation he does feel more fatigued.  Notes increased difficulty with fluid retention requiring more frequent dosing of extra Lasix tablet.  He required extra Lasix 3/29, 3/30, 4/3, 4/6, 4/7, 4/9, 4/11 with improvement in volume status.  Reviewed treatment modalities of atrial fibrillation as he is hesitant regarding procedures.   ROS: Please see the history of present illness.    All other  systems reviewed and are negative.   Studies Reviewed: .           Risk Assessment/Calculations:             Physical Exam:   VS:  BP (!) 100/54 (BP Location: Left Arm, Patient Position: Sitting, Cuff Size: Large)   Pulse 68   Ht 6' (1.829 m)   Wt 248 lb (112.5 kg)   SpO2 99%   BMI 33.63 kg/m    Wt Readings from Last 3 Encounters:  06/27/23 248 lb (112.5 kg)  06/14/23 246 lb 3.2 oz (111.7 kg)  06/08/23 247 lb (112 kg)    GEN: Well nourished, well developed in no acute distress NECK: No JVD; No carotid bruits CARDIAC: RRR, no murmurs, rubs, gallops RESPIRATORY:  Clear to auscultation without rales, wheezing or rhonchi  ABDOMEN: Soft, non-tender, non-distended EXTREMITIES:  No edema; No deformity   ASSESSMENT AND PLAN: .    DOE / Diastolic dysfunction - Echo 02/2023 LVEF 55-60%, gr2dd. GDMT includes Farxiga 10mg  daily, Lasix 20mg  daily with additional 20mg  PRN for weight gain of 2 lbs overnight or 5 lbs in one week, Losartan 50mg  daily. Low sodium diet, fluid restriction <2L, and daily weights encouraged. Educated to contact our office for weight gain of 2 lbs overnight or 5 lbs in one week.  Anticipate DOE and volume status exacerbated by PAF. Management, as below. Update BNP/BMP for monitoring of electrolytes, renal function and consideration of adjustment of diuretic. He is tolerating Lasix with  additional PRN dosing well.   HTN - Now with propensity towards hypotension with extra Lasix.  Afternoon BP readings as low as 89/64.  Average BP over the last 5 days 104/70 with no lightheadedness, dizziness. He will contact us  if he notes symptomatic hypotension and could reduce Losartan dose at that time. Continue Lasix 20mg  daily, Losartan 50mg  daily. Discussed to monitor BP at home at least 2 hours after medications and sitting for 5-10 minutes.   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 Continue SBE  prophylaxis.  HLD - Continue Simvastatin 20mg  daily.   Symptomatic bradycardia/intermittent high-grade AV block s/p PPM / PAF / Hypercoagulable state -follows with EP and Afib Clinic. AFib frequency decreased by Apple Watch. Prefers to avoid invasive procedure such as ablation. RRR by auscultation and decreased frequency PAF by Apple Watch - low suspicion cardioversion helpful due to paroxysmal nature. Follow up as scheduled with AFib clinic for discussion of management strategy.  I do hesitate about rate control as it appears afib is worsening volume status.        Dispo: follow up in 3-4 months  Signed, Clearnce Curia, NP

## 2023-06-28 LAB — BASIC METABOLIC PANEL WITH GFR
BUN/Creatinine Ratio: 14 (ref 10–24)
BUN: 18 mg/dL (ref 8–27)
CO2: 19 mmol/L — ABNORMAL LOW (ref 20–29)
Calcium: 9.3 mg/dL (ref 8.6–10.2)
Chloride: 101 mmol/L (ref 96–106)
Creatinine, Ser: 1.28 mg/dL — ABNORMAL HIGH (ref 0.76–1.27)
Glucose: 112 mg/dL — ABNORMAL HIGH (ref 70–99)
Potassium: 4 mmol/L (ref 3.5–5.2)
Sodium: 140 mmol/L (ref 134–144)
eGFR: 57 mL/min/{1.73_m2} — ABNORMAL LOW (ref >59)

## 2023-06-28 LAB — BRAIN NATRIURETIC PEPTIDE: BNP: 93 pg/mL (ref 0.0–100.0)

## 2023-06-28 LAB — MAGNESIUM: Magnesium: 2.2 mg/dL (ref 1.6–2.3)

## 2023-06-29 ENCOUNTER — Encounter (HOSPITAL_BASED_OUTPATIENT_CLINIC_OR_DEPARTMENT_OTHER): Payer: Self-pay

## 2023-06-29 ENCOUNTER — Telehealth (HOSPITAL_BASED_OUTPATIENT_CLINIC_OR_DEPARTMENT_OTHER): Payer: Self-pay

## 2023-06-29 DIAGNOSIS — I10 Essential (primary) hypertension: Secondary | ICD-10-CM

## 2023-06-29 DIAGNOSIS — E782 Mixed hyperlipidemia: Secondary | ICD-10-CM

## 2023-06-29 NOTE — Telephone Encounter (Signed)
-----   Message from Clearnce Curia sent at 06/29/2023  8:05 AM EDT ----- BNP shows volume status improved. Normal magnesium, potassium. Creatinine very mildly elevated likely due to needing increased doses of Lasix recently. Overall a very mild change. Recommend continuing Lasix 20mg  daily with additional 20mg  as needed for weight gain of 2 lbs overnight or 5 lbs in one week. Repeat BMET in 1 month for monitoring of kidney function.

## 2023-06-29 NOTE — Progress Notes (Signed)
 Remote pacemaker transmission.

## 2023-06-30 ENCOUNTER — Other Ambulatory Visit (HOSPITAL_BASED_OUTPATIENT_CLINIC_OR_DEPARTMENT_OTHER): Payer: Self-pay

## 2023-07-05 ENCOUNTER — Ambulatory Visit (HOSPITAL_COMMUNITY)
Admission: RE | Admit: 2023-07-05 | Discharge: 2023-07-05 | Disposition: A | Discharge status: Home / Self Care | Source: Ambulatory Visit | Attending: Internal Medicine | Admitting: Internal Medicine

## 2023-07-05 ENCOUNTER — Other Ambulatory Visit (HOSPITAL_BASED_OUTPATIENT_CLINIC_OR_DEPARTMENT_OTHER): Payer: Self-pay

## 2023-07-05 VITALS — BP 96/60 | HR 73 | Ht 72.0 in | Wt 247.0 lb

## 2023-07-05 DIAGNOSIS — Z7901 Long term (current) use of anticoagulants: Secondary | ICD-10-CM | POA: Insufficient documentation

## 2023-07-05 DIAGNOSIS — D6869 Other thrombophilia: Secondary | ICD-10-CM | POA: Insufficient documentation

## 2023-07-05 DIAGNOSIS — I441 Atrioventricular block, second degree: Secondary | ICD-10-CM | POA: Insufficient documentation

## 2023-07-05 DIAGNOSIS — I5022 Chronic systolic (congestive) heart failure: Secondary | ICD-10-CM | POA: Diagnosis not present

## 2023-07-05 DIAGNOSIS — Z7982 Long term (current) use of aspirin: Secondary | ICD-10-CM | POA: Insufficient documentation

## 2023-07-05 DIAGNOSIS — E785 Hyperlipidemia, unspecified: Secondary | ICD-10-CM | POA: Insufficient documentation

## 2023-07-05 DIAGNOSIS — Z950 Presence of cardiac pacemaker: Secondary | ICD-10-CM | POA: Diagnosis not present

## 2023-07-05 DIAGNOSIS — Z952 Presence of prosthetic heart valve: Secondary | ICD-10-CM | POA: Diagnosis not present

## 2023-07-05 DIAGNOSIS — I110 Hypertensive heart disease with heart failure: Secondary | ICD-10-CM | POA: Diagnosis not present

## 2023-07-05 DIAGNOSIS — Z8546 Personal history of malignant neoplasm of prostate: Secondary | ICD-10-CM | POA: Diagnosis not present

## 2023-07-05 DIAGNOSIS — I480 Paroxysmal atrial fibrillation: Secondary | ICD-10-CM | POA: Insufficient documentation

## 2023-07-05 DIAGNOSIS — I4891 Unspecified atrial fibrillation: Secondary | ICD-10-CM | POA: Diagnosis not present

## 2023-07-05 LAB — CBC
HCT: 43.5 % (ref 39.0–52.0)
Hemoglobin: 14.4 g/dL (ref 13.0–17.0)
MCH: 29.4 pg (ref 26.0–34.0)
MCHC: 33.1 g/dL (ref 30.0–36.0)
MCV: 88.8 fL (ref 80.0–100.0)
Platelets: 152 10*3/uL (ref 150–400)
RBC: 4.9 MIL/uL (ref 4.22–5.81)
RDW: 13.2 % (ref 11.5–15.5)
WBC: 5.2 10*3/uL (ref 4.0–10.5)
nRBC: 0 % (ref 0.0–0.2)

## 2023-07-05 LAB — BASIC METABOLIC PANEL WITH GFR
Anion gap: 10 (ref 5–15)
BUN: 15 mg/dL (ref 8–23)
CO2: 22 mmol/L (ref 22–32)
Calcium: 9 mg/dL (ref 8.9–10.3)
Chloride: 105 mmol/L (ref 98–111)
Creatinine, Ser: 1.29 mg/dL — ABNORMAL HIGH (ref 0.61–1.24)
GFR, Estimated: 56 mL/min — ABNORMAL LOW (ref >60)
Glucose, Bld: 111 mg/dL — ABNORMAL HIGH (ref 70–99)
Potassium: 3.6 mmol/L (ref 3.5–5.1)
Sodium: 137 mmol/L (ref 135–145)

## 2023-07-05 MED ORDER — AMIODARONE HCL 200 MG PO TABS
ORAL_TABLET | ORAL | 3 refills | Status: DC
  Filled 2023-07-05: qty 60, 30d supply, fill #0

## 2023-07-05 NOTE — Progress Notes (Signed)
 Primary Care Physician: de [place], Jeff Jansky, MD Primary Cardiologist: Jeff Donning, MD Electrophysiologist: Jeff Kohler, MD     Referring Physician: [facility] clinic alert     Jeff Johnson is a 78 y.o. male with a history of HLD, AAA, AI s/p AVR, symptomatic bradycardia, Mobitz II AV block s/p PPM, prostate cancer, chronic diastolic heart failure, HTN, BPH, and atrial fibrillation who presents for consultation in the [facility]. Device clinic alert on 3/31 for ongoing Afib with RVR since 3/30. Patient is on ASA 81 mg daily. He has a CHADS2VASC score of 4.  On evaluation today, he is currently in Afib. He feels possibly more SOB and tired than usual. He denies having any OSA due to frequent urination at night. He takes ASA 81 mg daily.    On follow up 07/05/23, he is currently in Afib. Patient notes via Apple watch his Afib has been paroxysmal since last office visit. He began Eliquis  5 mg BID in addition to ASA 81 mg daily. No missed doses of Eliquis .   Today, he denies symptoms of palpitations, chest pain, orthopnea, PND, lower extremity edema, dizziness, presyncope, syncope, snoring, daytime somnolence, bleeding, or neurologic sequela. The patient is tolerating medications without difficulties and is otherwise without complaint today.   he has a BMI of Body mass index is 33.5 kg/m.Jeff Johnson Filed Weights   07/05/23 1002  Weight: 112 kg     Current Outpatient Medications  Medication Sig Dispense Refill   acetaminophen  (TYLENOL ) 650 MG CR tablet Take 1,300 mg by mouth 3 (three) times daily as needed for pain or fever.     amiodarone  (PACERONE ) 200 MG tablet Take 1 tablet (200 mg total) by mouth 2 (two) times daily for 30 days, THEN 1 tablet (200 mg total) daily. 60 tablet 3   apixaban  (ELIQUIS ) 5 MG TABS tablet Take 1 tablet (5 mg total) by mouth 2 (two) times daily. 60 tablet 3   ascorbic acid (VITAMIN C) 500 MG tablet Take 500 mg by mouth daily.      ASPIRIN  81 PO Take 1 tablet by mouth daily.     benzonatate  (TESSALON ) 200 MG capsule Take 1 capsule (200 mg total) by mouth 3 (three) times daily as needed for cough. 45 capsule 0   Cholecalciferol (VITAMIN D  PO) Take 1 tablet by mouth daily. Patient takes 5,000 units daily     dapagliflozin  propanediol (FARXIGA ) 10 MG TABS tablet Take 1 tablet (10 mg total) by mouth daily before breakfast. 90 tablet 2   furosemide  (LASIX ) 20 MG tablet Take 1 tablet (20 mg total) by mouth daily. Take additional 20mg  as needed for weight gain of 2 lbs overnight of 5 lbs in one week. 135 tablet 1   lansoprazole  (PREVACID ) 15 MG capsule Take 1 capsule (15 mg total) by mouth daily at 12 noon. 30 capsule 3   losartan  (COZAAR ) 50 MG tablet Take 1 tablet (50 mg total) by mouth daily. 90 tablet 1   montelukast  (SINGULAIR ) 10 MG tablet Take 1 tablet (10 mg total) by mouth daily. 30 tablet 3   Multiple Vitamins-Minerals (PRESERVISION AREDS 2) CAPS Take 1 capsule by mouth in the morning and at bedtime.     simvastatin  (ZOCOR ) 20 MG tablet Take 1 tablet (20 mg total) by mouth daily. 30 tablet 3   tamsulosin  (FLOMAX ) 0.4 MG CAPS capsule Take 1 capsule (0.4 mg total) by mouth daily. 30 capsule 6   zinc gluconate 50 MG tablet  Take 50 mg by mouth daily. Patient takes 50/75 daily     No current facility-administered medications for this encounter.    Atrial Fibrillation Management history:  Previous antiarrhythmic drugs: none Previous cardioversions: none Previous ablations: none Anticoagulation history: Eliquis    ROS- All systems are reviewed and negative except as per the HPI above.  Physical Exam: BP 96/60   Pulse 73   Ht 6' (1.829 m)   Wt 112 kg   BMI 33.50 kg/m   GEN- The patient is well appearing, alert and oriented x 3 today.   Neck - no JVD or carotid bruit noted Lungs- Clear to ausculation bilaterally, normal work of breathing Heart- Irregular rate and rhythm, no murmurs, rubs or gallops, PMI not  laterally displaced Extremities- no clubbing, cyanosis, or edema Skin - no rash or ecchymosis noted   EKG today demonstrates  Vent. rate 73 BPM PR interval * ms QRS duration 96 ms QT/QTcB 398/438 ms P-R-T axes * -52 90 Atrial fibrillation with frequent ventricular-paced complexes Left axis deviation Inferior infarct , age undetermined Abnormal ECG When compared with ECG of 14-Jun-2023 09:08, PREVIOUS ECG IS PRESENT  Echo 02/22/23 demonstrated  1. Left ventricular ejection fraction, by estimation, is 55 to 60%. The  left ventricle has normal function. The left ventricle has no regional  wall motion abnormalities. There is mild concentric left ventricular  hypertrophy. Left ventricular diastolic  parameters are consistent with Grade II diastolic dysfunction  (pseudonormalization). Elevated left atrial pressure.   2. Right ventricular systolic function is normal. The right ventricular  size is normal. There is mildly elevated pulmonary artery systolic  pressure. The estimated right ventricular systolic pressure is 35.9 mmHg.   3. Left atrial size was mildly dilated.   4. The mitral valve is normal in structure. No evidence of mitral valve  regurgitation. No evidence of mitral stenosis.   5. The aortic valve has been repaired/replaced. There is mild  calcification of the aortic valve. There is mild thickening of the aortic  valve. Aortic valve regurgitation is not visualized. 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 Procedure Date: 2011. Echo findings are consistent with normal  structure and function of the aortic valve prosthesis. Aortic valve mean  gradient measures 5.0 mmHg. Aortic valve Vmax measures 1.50 m/s.   6. Previous ascending aorta Bentall repair. Aortic dilatation noted.  There is mild dilatation of the aortic root, measuring 40 mm. There is  mild dilatation of the ascending aorta, measuring 41 mm.   ASSESSMENT &  PLAN CHA2DS2-VASc Score = 4  The patient's score is based upon: CHF History: 1 HTN History: 1 Diabetes History: 0 Stroke History: 0 Vascular Disease History: 0 Age Score: 2 Gender Score: 0       ASSESSMENT AND PLAN: Paroxysmal Atrial Fibrillation (ICD10:  I48.0) The patient's CHA2DS2-VASc score is 4, indicating a 4.8% annual risk of stroke.    He is currently in Afib. Review of Apple watch shows patient has been having paroxysmal episodes of Afib. We had a long discussion about medication treatments and ablation in detail. We discussed potential options such as rhythm control and ablation. We talked about the monitoring required for these medications and potential adverse effects. We also discussed pulse field ablation as an option in the form of intervention. After discussion, patient is interested in trying amiodarone  as medication therapy. If unsuccessful, will discuss ablation with EP. We will begin amiodarone  load 200 mg BID x  4 weeks then transition to 200 mg once daily. Pharmacy review okay to trial amiodarone  given noted allergies to shellfish / contrast media. I advised patient to contact clinic if he notes any adverse effects with beginning load.   Secondary Hypercoagulable State (ICD10:  D68.69) The patient is at significant risk for stroke/thromboembolism based upon his CHA2DS2-VASc Score of 4.  Start Apixaban  (Eliquis ).  No missed doses of Eliquis  5 mg BID. He remains on ASA 81 mg per Cardiology due to history of AVR.     Follow up 3 weeks Afib clinic.    Minnie Amber, PA-C  [facility] [address] [place], [place] [address] [phone]

## 2023-07-05 NOTE — Patient Instructions (Signed)
 Start Amiodarone  200mg  twice a day for 30 days then reduce to once a day  Take with food

## 2023-07-07 NOTE — Telephone Encounter (Signed)
 Please review and advise.

## 2023-07-14 ENCOUNTER — Telehealth (HOSPITAL_BASED_OUTPATIENT_CLINIC_OR_DEPARTMENT_OTHER): Payer: Self-pay | Admitting: *Deleted

## 2023-07-14 NOTE — Telephone Encounter (Signed)
Routing back to correct triage pool

## 2023-07-14 NOTE — Telephone Encounter (Signed)
 Copied from CRM [id number]. Topic: Clinical - Medication Question >> Jul 14, 2023  1:50 PM Santiya F wrote: Reason for CRM: Patient is calling in because would like to discuss his medications and the issues he is having with them. Please follow up with patient.

## 2023-07-14 NOTE — Telephone Encounter (Signed)
 Patient tried calling afib clinic was put on amiodorone and ever since has been nauseas and not able to keep things down. His acid reflux has also been terrible. He did not take the medication this morning and feels much better. Afib clinic will not return his call per patient.   Please advise on recommendations for patient

## 2023-07-15 ENCOUNTER — Telehealth (HOSPITAL_BASED_OUTPATIENT_CLINIC_OR_DEPARTMENT_OTHER): Payer: Self-pay | Admitting: Family Medicine

## 2023-07-15 ENCOUNTER — Telehealth (HOSPITAL_COMMUNITY): Payer: Self-pay

## 2023-07-15 ENCOUNTER — Other Ambulatory Visit (HOSPITAL_BASED_OUTPATIENT_CLINIC_OR_DEPARTMENT_OTHER): Payer: Self-pay

## 2023-07-15 DIAGNOSIS — I4891 Unspecified atrial fibrillation: Secondary | ICD-10-CM

## 2023-07-15 NOTE — Telephone Encounter (Signed)
 Patient called to let us  know since he started Amiodarone  he is feeling nauseous and spitting up some acid along with reflux with gagging sensation. He is not interested in decreasing this medication. Patient states he has stopped this medication due to not being able to tolerate. Per Ross Coombs patient advised to discontinue Amiodarone  medication and we will refer this patient to EP to discuss ablation. I have placed referral for [facility] to reach out for consultation appointment. Consulted with patient and he verbalized understanding.

## 2023-07-15 NOTE — Telephone Encounter (Signed)
 Please see other phone note awaiting providers response

## 2023-07-15 NOTE — Telephone Encounter (Signed)
 Patient came by to discuss new medication that is upsetting his stomach (amiodarone )

## 2023-07-16 ENCOUNTER — Other Ambulatory Visit (HOSPITAL_BASED_OUTPATIENT_CLINIC_OR_DEPARTMENT_OTHER): Payer: Self-pay

## 2023-07-16 ENCOUNTER — Other Ambulatory Visit: Payer: Self-pay

## 2023-07-18 NOTE — Telephone Encounter (Signed)
 Pt advised with verbal understanding and was made an appt with afib clinic 5/2

## 2023-07-20 ENCOUNTER — Other Ambulatory Visit: Payer: Self-pay

## 2023-07-20 ENCOUNTER — Encounter: Payer: Self-pay | Admitting: Cardiology

## 2023-07-20 ENCOUNTER — Ambulatory Visit: Attending: Cardiology | Admitting: Cardiology

## 2023-07-20 VITALS — BP 118/58 | HR 64 | Ht 72.0 in | Wt 246.0 lb

## 2023-07-20 DIAGNOSIS — R001 Bradycardia, unspecified: Secondary | ICD-10-CM

## 2023-07-20 DIAGNOSIS — Z950 Presence of cardiac pacemaker: Secondary | ICD-10-CM

## 2023-07-20 DIAGNOSIS — I4891 Unspecified atrial fibrillation: Secondary | ICD-10-CM | POA: Diagnosis not present

## 2023-07-20 NOTE — Patient Instructions (Addendum)
 Medication Instructions:  Your physician recommends that you continue on your current medications as directed. Please refer to the Current Medication list given to you today.  *If you need a refill on your cardiac medications before your next appointment, please call your pharmacy*  Lab Work: BMET and CBC - you may go to any [facility] location to have these drawn within 30 days of your procedure (anytime after June 28th)  Testing/Procedures: Cardiac CT Your physician has requested that you have cardiac CT. Cardiac computed tomography (CT) is a painless test that uses an x-ray machine to take clear, detailed pictures of your heart. For further information please visit [email or web address]  We will call you to schedule your CT scan. It will be done about three weeks prior to your ablation.  Ablation Your physician has recommended that you have an ablation. Catheter ablation is a medical procedure used to treat some cardiac arrhythmias (irregular heartbeats). During catheter ablation, a long, thin, flexible tube is put into a blood vessel in your groin (upper thigh), or neck. This tube is called an ablation catheter. It is then guided to your heart through the blood vessel. Radio frequency waves destroy small areas of heart tissue where abnormal heartbeats may cause an arrhythmia to start. You are scheduled for Atrial Fibrillation Ablation on Monday, July 28 with Dr. Harvie Liner.Please arrive at the Main Entrance A at [facility]: [address] [place], [place] [address] at 5:30 AM    Follow-Up: At [facility] you and your health needs are our priority.  As part of our continuing mission to provide you with exceptional heart care, we have created designated Provider Care Teams.  These Care Teams include your primary Cardiologist (physician) and Advanced Practice Providers (APPs -  Physician Assistants and Nurse Practitioners) who all work together to provide you with the care you  need, when you need it.   Your next appointment:   We will contact you about your post-procedure follow up appointments.

## 2023-07-20 NOTE — Progress Notes (Signed)
 Electrophysiology Office Note:    Date:  07/20/2023   ID:  Johnson, Jeff 02-26-1946, MRN [id number]  [facility] Cardiologist:  Sheryle Donning, MD  [facility] Electrophysiologist:  Ardeen Kohler, MD   Referring MD: Nathanel Bal, PA-C   Chief Complaint: Atrial fibrillation  History of Present Illness:    [name] is an 78 year old man who I am seeing today for evaluation of atrial fibrillation at the request of Jeff Johnson.  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  His atrial fibrillation was first detected by his pacemaker in March of this year.  He is on Eliquis  twice daily for stroke prophylaxis.  When he saw Autry Legions in the A-fib clinic on July 05, 2023 treatment options were discussed with the patient.  The patient elected to start amiodarone  for rhythm control.  Catheter ablation was discussed but the decision was made to trial medication therapy first.    He called the office Jul 14, 2023 reporting nausea, reflux while on amiodarone .  He stopped this medication.  He is referred to discuss catheter ablation  for his arrhythmia.  He is with his wife today in clinic.        Their past medical, social and family history was reviewed.   ROS:   Please see the history of present illness.    All other systems reviewed and are negative.  EKGs/Labs/Other Studies Reviewed:    The following studies were reviewed today:  July 05, 2023 EKG reviewed.  Atrial fibrillation.  QTc 438 ms.   Jul 20, 2023 implant device interrogation personally reviewed Battery longevity 9.7 years Lead parameter stable Presenting rhythm is atrial fibrillation Atrial pacing 12% Ventricular pacing 74%       Physical Exam:    VS:  BP (!) 118/58 (BP Location: Left Arm, Patient Position: Sitting,  Cuff Size: Large)   Pulse 64   Ht 6' (1.829 m)   Wt 246 lb (111.6 kg)   SpO2 98%   BMI 33.36 kg/m     Wt Readings from Last 3 Encounters:  07/20/23 246 lb (111.6 kg)  07/05/23 247 lb (112 kg)  06/27/23 248 lb (112.5 kg)     GEN: no distress.  Obese CARD: Irregularly irregular, No MRG RESP: No IWOB. CTAB.        ASSESSMENT AND PLAN:    1. Atrial fibrillation, unspecified type (HCC)   2. Symptomatic bradycardia   3. Cardiac pacemaker in situ     #Persistent atrial fibrillation Symptomatic. On Eliquis  for stroke prophylaxis Did not tolerate amiodarone   I discussed treatment options with the patient during today's clinic appointment.  We discussed Tikosyn and atrial fibrillation ablation.  He is interested in proceeding with catheter ablation.  I discussed the ablation procedure in detail including the risks and recovery and he wishes to proceed.  Discussed treatment options today for AF including antiarrhythmic drug therapy and ablation. Discussed risks, recovery and likelihood of success with each treatment strategy. Risk, benefits, and alternatives to EP study and ablation for afib were discussed. These risks include but are not limited to stroke, bleeding, vascular damage, tamponade, perforation, damage to the esophagus, lungs, phrenic nerve and other structures, pulmonary vein stenosis, worsening renal function, coronary vasospasm and death.  Discussed potential need for repeat ablation procedures and antiarrhythmic drugs after an initial ablation. The patient understands these risk  and wishes to proceed.  We will therefore proceed with catheter ablation at the next available time.  Carto, ICE, anesthesia are requested for the procedure.  Will also obtain CT PV protocol prior to the procedure to exclude LAA thrombus and further evaluate atrial anatomy.  Ablation strategy would be PVI plus posterior wall   #Symptomatic bradycardia #Permanent pacemaker in situ Transfer  remotes    Signed, Donelda Fujita T. Marven Slimmer, MD, [facility], [facility] 07/20/2023 5:40 PM    Electrophysiology [facility]

## 2023-07-23 ENCOUNTER — Other Ambulatory Visit (HOSPITAL_BASED_OUTPATIENT_CLINIC_OR_DEPARTMENT_OTHER): Payer: Self-pay | Admitting: Family Medicine

## 2023-07-23 DIAGNOSIS — I71019 Dissection of thoracic aorta, unspecified: Secondary | ICD-10-CM

## 2023-07-25 ENCOUNTER — Other Ambulatory Visit (HOSPITAL_BASED_OUTPATIENT_CLINIC_OR_DEPARTMENT_OTHER): Payer: Self-pay

## 2023-07-25 MED ORDER — MONTELUKAST SODIUM 10 MG PO TABS
10.0000 mg | ORAL_TABLET | Freq: Every day | ORAL | 3 refills | Status: DC
  Filled 2023-07-25: qty 30, 30d supply, fill #0
  Filled 2023-08-22: qty 30, 30d supply, fill #1
  Filled 2023-09-21: qty 30, 30d supply, fill #2
  Filled 2023-10-17 – 2023-10-20 (×4): qty 30, 30d supply, fill #3

## 2023-07-25 MED ORDER — SIMVASTATIN 20 MG PO TABS
20.0000 mg | ORAL_TABLET | Freq: Every day | ORAL | 3 refills | Status: DC
  Filled 2023-07-25: qty 30, 30d supply, fill #0
  Filled 2023-08-22: qty 30, 30d supply, fill #1
  Filled 2023-09-17: qty 30, 30d supply, fill #2
  Filled 2023-10-17: qty 30, 30d supply, fill #3

## 2023-07-26 ENCOUNTER — Ambulatory Visit (HOSPITAL_COMMUNITY): Admitting: Internal Medicine

## 2023-07-27 ENCOUNTER — Other Ambulatory Visit (HOSPITAL_BASED_OUTPATIENT_CLINIC_OR_DEPARTMENT_OTHER): Payer: Self-pay

## 2023-07-27 DIAGNOSIS — I10 Essential (primary) hypertension: Secondary | ICD-10-CM | POA: Diagnosis not present

## 2023-07-27 DIAGNOSIS — E782 Mixed hyperlipidemia: Secondary | ICD-10-CM | POA: Diagnosis not present

## 2023-07-28 ENCOUNTER — Ambulatory Visit (HOSPITAL_BASED_OUTPATIENT_CLINIC_OR_DEPARTMENT_OTHER): Payer: Self-pay | Admitting: Family

## 2023-07-28 DIAGNOSIS — I10 Essential (primary) hypertension: Secondary | ICD-10-CM

## 2023-07-28 LAB — BASIC METABOLIC PANEL WITH GFR
BUN/Creatinine Ratio: 10 (ref 10–24)
BUN: 12 mg/dL (ref 8–27)
CO2: 21 mmol/L (ref 20–29)
Calcium: 9.2 mg/dL (ref 8.6–10.2)
Chloride: 101 mmol/L (ref 96–106)
Creatinine, Ser: 1.25 mg/dL (ref 0.76–1.27)
Glucose: 134 mg/dL — ABNORMAL HIGH (ref 70–99)
Potassium: 3.4 mmol/L — ABNORMAL LOW (ref 3.5–5.2)
Sodium: 143 mmol/L (ref 134–144)
eGFR: 58 mL/min/{1.73_m2} — ABNORMAL LOW (ref >59)

## 2023-07-29 ENCOUNTER — Other Ambulatory Visit (HOSPITAL_BASED_OUTPATIENT_CLINIC_OR_DEPARTMENT_OTHER): Payer: Self-pay

## 2023-07-29 MED ORDER — POTASSIUM CHLORIDE CRYS ER 20 MEQ PO TBCR
20.0000 meq | EXTENDED_RELEASE_TABLET | Freq: Every day | ORAL | 0 refills | Status: DC
  Filled 2023-07-29: qty 3, 3d supply, fill #0

## 2023-07-29 NOTE — Addendum Note (Signed)
 Addended by: Guss Legacy on: 07/29/2023 07:29 AM   Modules accepted: Orders

## 2023-08-01 ENCOUNTER — Other Ambulatory Visit (HOSPITAL_BASED_OUTPATIENT_CLINIC_OR_DEPARTMENT_OTHER): Payer: Self-pay | Admitting: Family

## 2023-08-03 ENCOUNTER — Other Ambulatory Visit (HOSPITAL_BASED_OUTPATIENT_CLINIC_OR_DEPARTMENT_OTHER): Payer: Self-pay

## 2023-08-05 ENCOUNTER — Other Ambulatory Visit (HOSPITAL_BASED_OUTPATIENT_CLINIC_OR_DEPARTMENT_OTHER): Payer: Self-pay

## 2023-08-05 ENCOUNTER — Encounter (HOSPITAL_BASED_OUTPATIENT_CLINIC_OR_DEPARTMENT_OTHER): Payer: Self-pay

## 2023-08-09 ENCOUNTER — Other Ambulatory Visit (HOSPITAL_BASED_OUTPATIENT_CLINIC_OR_DEPARTMENT_OTHER): Payer: Self-pay | Admitting: *Deleted

## 2023-08-09 ENCOUNTER — Other Ambulatory Visit (HOSPITAL_COMMUNITY): Payer: Self-pay | Admitting: Family Medicine

## 2023-08-09 ENCOUNTER — Other Ambulatory Visit (HOSPITAL_BASED_OUTPATIENT_CLINIC_OR_DEPARTMENT_OTHER): Payer: Self-pay

## 2023-08-09 DIAGNOSIS — R350 Frequency of micturition: Secondary | ICD-10-CM

## 2023-08-09 DIAGNOSIS — I10 Essential (primary) hypertension: Secondary | ICD-10-CM | POA: Diagnosis not present

## 2023-08-09 LAB — BASIC METABOLIC PANEL WITH GFR
BUN/Creatinine Ratio: 13 (ref 10–24)
BUN: 16 mg/dL (ref 8–27)
CO2: 22 mmol/L (ref 20–29)
Calcium: 8.8 mg/dL (ref 8.6–10.2)
Chloride: 104 mmol/L (ref 96–106)
Creatinine, Ser: 1.26 mg/dL (ref 0.76–1.27)
Glucose: 100 mg/dL — ABNORMAL HIGH (ref 70–99)
Potassium: 3.8 mmol/L (ref 3.5–5.2)
Sodium: 142 mmol/L (ref 134–144)
eGFR: 58 mL/min/{1.73_m2} — ABNORMAL LOW (ref >59)

## 2023-08-10 ENCOUNTER — Other Ambulatory Visit (HOSPITAL_BASED_OUTPATIENT_CLINIC_OR_DEPARTMENT_OTHER): Payer: Self-pay

## 2023-08-10 ENCOUNTER — Ambulatory Visit (HOSPITAL_BASED_OUTPATIENT_CLINIC_OR_DEPARTMENT_OTHER): Admitting: Family Medicine

## 2023-08-10 ENCOUNTER — Encounter (HOSPITAL_BASED_OUTPATIENT_CLINIC_OR_DEPARTMENT_OTHER): Payer: Self-pay | Admitting: Family Medicine

## 2023-08-10 VITALS — BP 119/59 | HR 70 | Ht 72.0 in | Wt 248.0 lb

## 2023-08-10 DIAGNOSIS — N3001 Acute cystitis with hematuria: Secondary | ICD-10-CM | POA: Diagnosis not present

## 2023-08-10 LAB — URINALYSIS, ROUTINE W REFLEX MICROSCOPIC
Bilirubin, UA: NEGATIVE
Ketones, UA: NEGATIVE
Nitrite, UA: POSITIVE — AB
Specific Gravity, UA: 1.023 (ref 1.005–1.030)
Urobilinogen, Ur: 1 mg/dL (ref 0.2–1.0)
pH, UA: 6 (ref 5.0–7.5)

## 2023-08-10 LAB — MICROSCOPIC EXAMINATION
Casts: NONE SEEN /LPF
Epithelial Cells (non renal): NONE SEEN /HPF (ref 0–10)
RBC, Urine: >30 /HPF — AB (ref 0–2)
WBC, UA: >30 /HPF — AB (ref 0–5)

## 2023-08-10 MED ORDER — SULFAMETHOXAZOLE-TRIMETHOPRIM 800-160 MG PO TABS
1.0000 | ORAL_TABLET | Freq: Two times a day (BID) | ORAL | 0 refills | Status: AC
  Filled 2023-08-10: qty 14, 7d supply, fill #0

## 2023-08-10 NOTE — Progress Notes (Signed)
 Acute Care Office Visit  Subjective:   Jeff Johnson 10/26/1945 08/10/2023  Chief Complaint  Patient presents with   Urinary Tract Infection    Patient is here today to see if he might have a UTI. States that he has been having burning when he urinates. Is still SOB almost all the time.    HPI: URINARY SYMPTOMS Onset 3 days ago, reports urinary frequency, dysuria,  and urgency.   Denies abdominal pain, back pain, fever,   Fever/chills: no Dysuria: yes Urinary frequency: yes Urgency: yes Foul odor: no Urinary incontinence: no Hematuria: no Abdominal pain: no Suprapubic pain/pressure: no Flank/low back pain: no Nausea/Vomiting: no  Treatments tried: None  Previous urinary tract infection: Yes, 2023 Recurrent urinary tract infection: no History of sexually transmitted disease: no    The following portions of the patient's history were reviewed and updated as appropriate: past medical history, past surgical history, family history, social history, allergies, medications, and problem list.   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   Dysuria 03/04/2022   UTI (urinary tract infection) 02/22/2022   Elevated serum creatinine 02/22/2022   Fatigue 02/09/2022   Urinary frequency 02/09/2022   Medicare annual wellness visit, subsequent 08/07/2021   Bilateral impacted cerumen 11/13/2020   Sensorineural hearing loss (SNHL), 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   Positive colorectal cancer screening using Cologuard test    Benign neoplasm of ascending  colon    Benign neoplasm of transverse colon    Pain of left hip joint 04/22/2017   History of revision of total replacement of left hip joint 04/09/2017   History of left hip replacement 04/08/2017   Osteoarthritis 04/06/2017   Arthritis of right hand 02/07/2017   Pain in right hand 02/07/2017   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   Essential hypertension 05/21/2014   Orthostatic hypotension    BPH (benign prostatic hyperplasia)    Diverticulitis    Hyponatremia    H/O cardiac catheterization    Hematuria    Severe aortic stenosis s/p pericardial AVR, Bentall in 2011    Contrast media allergy    Obesity    Prostate cancer (HCC)    Chronic cystitis    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   Shortness of breath 03/18/2014   Hyperlipidemia 03/18/2014   GERD (gastroesophageal reflux disease) 03/18/2014   Aneurysm of aorta (HCC) 07/03/2012   History of prostate cancer 07/03/2012   Shoulder pain 05/02/2012   Hematest positive stools 02/03/2012   History of hypertension 05/18/2010   SYNCOPE 02/18/2010   ORTHOSTATIC DIZZINESS 02/18/2010   Allergic rhinitis 02/16/2010   Depression 01/30/2010   History of anemia 01/30/2010   Cardiomegaly 10/15/2009   Vitamin D  deficiency 09/03/2009   Hammer toe 12/16/2008   Osteoarthritis 12/03/2008   Hearing loss 06/20/2008   Past Medical History:  Diagnosis Date   Arthritis    Benign essential HTN 05/21/2014   BPH (benign prostatic hyperplasia)    Chronic cystitis    a. With ongoing hematuria.   Contrast media allergy    Diverticulitis    a. 2010:  diverticulitis with stricture Sigmoid colectomy with mobilization of the splenic flexure within the end anastomosis.    Dizziness and giddiness    a. H/o dizziness with reportedly negative tilt table. Holter 2012: NSR, sinus tachy, frequent PVCs, multifocal at times in a trigeminal fashion.   Dyspnea on exertion 03/18/2014   Frequency of urination    GERD (gastroesophageal  reflux disease)    H/O cardiac catheterization    a. 2011: minor luminal irregularities. b. Nuc 03/2014: ow risk without reversible ischemia or infarction, EF 57%. (There is septal akinesis but otherwise normal wall motion. This is nonspecific.)   Headache    MIGRAINES   Hematuria    History of urinary retention    Hyperlipidemia 03/18/2014   Hyponatremia    a. Remote hx hyponatremia felt 2/2 SSRI.   Nocturia    Obesity    Orthostatic hypotension    Prostate cancer (HCC) 2011   a. s/p radiation.   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 11/2009: pericardial AVR/Bentall.   SYNCOPE 02/18/2010   Qualifier: Diagnosis of  By: Carolynne Citron, MD, Norva Beecham    Thoracic aortic aneurysm [facility])    a. 11/2009: s/p Magna Ease pericardial tissue valve size 27mm and replacement of fusiform ascending thoracic aneurysm with 30-mm Hemashield cath with hemiarch reconstruction.   Past Surgical History:  Procedure Laterality Date   BACK SURGERY  1985   CARPAL TUNNEL RELEASE     BIL WRISTS   CHOLECYSTECTOMY  2010   COLON SURGERY  2010   colon resection   COLONOSCOPY  2008   [facility]   COLONOSCOPY WITH PROPOFOL  N/A 09/02/2017   Procedure: COLONOSCOPY WITH PROPOFOL ;  Surgeon: Albertina Hugger, MD;  Location: [facility] ENDOSCOPY;  Service: Gastroenterology;  Laterality: N/A;   doppler carotid  2012   DOPPLER ECHOCARDIOGRAPHY  2011   PACEMAKER IMPLANT N/A 02/24/2023   Procedure: PACEMAKER IMPLANT;  Surgeon: Ardeen Kohler, MD;  Location: [facility] INVASIVE CV LAB;  Service: Cardiovascular;  Laterality: N/A;   POLYPECTOMY  09/02/2017   Procedure: POLYPECTOMY;  Surgeon: Albertina Hugger, MD;  Location: [facility] ENDOSCOPY;  Service: Gastroenterology;;   PROSTATE SURGERY  2011   adenocarcinoma w/raddiation    ROTATOR CUFF REPAIR     X2 LEFT SHOULDER   THORACIC AORTIC ANEURYSM REPAIR  12/11/09   ASCENDING THORACIC AORTIC ANEURYSM REPAIR   TISSUE AORTIC VALVE  REPLACEMENT  12/11/09   TOTAL HIP ARTHROPLASTY Left 04/17/2015   Procedure: LEFT TOTAL HIP ARTHROPLASTY ANTERIOR APPROACH;  Surgeon: Adonica Hoose, MD;  Location: [facility] ORS;  Service: Orthopedics;  Laterality: Left;   Family History  Problem Relation Age of Onset   Stroke Mother 55   Heart disease Father 66   Heart attack Father    Colon cancer Maternal Aunt    Stomach cancer Neg Hx    Esophageal cancer Neg Hx    Outpatient Medications Prior to Visit  Medication Sig Dispense Refill   acetaminophen  (TYLENOL ) 650 MG CR tablet Take 1,300 mg by mouth 3 (three) times daily as needed for pain or fever.     apixaban  (ELIQUIS ) 5 MG TABS tablet Take 1 tablet (5 mg total) by mouth 2 (two) times daily. 60 tablet 3   ascorbic acid (VITAMIN C) 500 MG tablet Take 500 mg by mouth daily.     ASPIRIN  81 PO Take 1 tablet by mouth daily.  benzonatate  (TESSALON ) 200 MG capsule Take 1 capsule (200 mg total) by mouth 3 (three) times daily as needed for cough. 45 capsule 0   Cholecalciferol (VITAMIN D  PO) Take 1 tablet by mouth daily. Patient takes 5,000 units daily     dapagliflozin  propanediol (FARXIGA ) 10 MG TABS tablet Take 1 tablet (10 mg total) by mouth daily before breakfast. 90 tablet 2   famotidine (PEPCID) 20 MG tablet Take 20 mg by mouth daily.     furosemide  (LASIX ) 20 MG tablet Take 1 tablet (20 mg total) by mouth daily. Take additional 20mg  as needed for weight gain of 2 lbs overnight of 5 lbs in one week. 135 tablet 1   lansoprazole  (PREVACID ) 15 MG capsule Take 1 capsule (15 mg total) by mouth daily at 12 noon. 30 capsule 3   losartan  (COZAAR ) 50 MG tablet Take 1 tablet (50 mg total) by mouth daily. 90 tablet 1   montelukast  (SINGULAIR ) 10 MG tablet Take 1 tablet (10 mg total) by mouth daily. 30 tablet 3   Multiple Vitamins-Minerals (PRESERVISION AREDS 2) CAPS Take 1 capsule by mouth in the morning and at bedtime.     potassium chloride  SA (KLOR-CON  M) 20 MEQ tablet Take 1 tablet (20 mEq total) by  mouth daily for 3 days. 3 tablet 0   simvastatin  (ZOCOR ) 20 MG tablet Take 1 tablet (20 mg total) by mouth daily. 30 tablet 3   tamsulosin  (FLOMAX ) 0.4 MG CAPS capsule Take 1 capsule (0.4 mg total) by mouth daily. 30 capsule 6   zinc gluconate 50 MG tablet Take 50 mg by mouth daily. Patient takes 50/75 daily     No facility-administered medications prior to visit.   Allergies  Allergen Reactions   Beta Adrenergic Blockers Other (See Comments)    Remote reportedh/o intolerance due to weakness per notes.Jeff AasAaron AasAaron AasCalled patient. Advised patient of provider's approval for requested procedure, as well as any comments/instructions from provider.    Iodinated Contrast Media Anaphylaxis   Shellfish Allergy Anaphylaxis and Other (See Comments)   Shellfish-Derived Products Anaphylaxis     ROS: A complete ROS was performed with pertinent positives/negatives noted in the HPI. The remainder of the ROS are negative.    Objective:   Today's Vitals   08/10/23 1535  BP: (!) 119/59  Pulse: 70  SpO2: 99%  Weight: 248 lb (112.5 kg)  Height: 6' (1.829 m)    GENERAL: Well-appearing, in NAD. Well nourished.  SKIN: Pink, warm and dry.  Head: Normocephalic. NECK: Trachea midline. Full ROM w/o pain or tenderness.  RESPIRATORY: Chest wall symmetrical. Respirations even and non-labored.  MSK: Muscle tone and strength appropriate for age.  GI: Abdomen soft, non-tender. Normoactive bowel sounds. No rebound tenderness. No hepatomegaly or splenomegaly. No CVA tenderness.  NEUROLOGIC: No motor or sensory deficits. Steady, even gait. C2-C12 intact.  PSYCH/MENTAL STATUS: Alert, oriented x 3. Cooperative, appropriate mood and affect.   Lab Results  Component Value Date   COLORU Yellow 08/09/2023   CLARITYU Clear 03/11/2022   GLUCOSEUR Negative 03/11/2022   BILIRUBINUR Negative 08/09/2023   KETONESU Negative 08/09/2023   SPECGRAV 1.023 08/09/2023   RBCUR Trace 03/11/2022   PHUR 6.0 08/09/2023   PROTEINUR 3+  (A) 08/09/2023   UROBILINOGEN 0.2 03/11/2022   LEUKOCYTESUR 2+ (A) 08/09/2023       Assessment & Plan:  1. Acute cystitis with hematuria (Primary) +Nitrites, leukocytes, red blood cells present in UA sample provided yesterday. Will send for Culture. Patient previously did not tolerate Macrobid   due to GI symptoms. Will start Bactrim BID x 7 days and change if abx culture needed. Follow up in 1 week or sooner if needed if symptoms worsen or do not improve.    Meds ordered this encounter  Medications   sulfamethoxazole-trimethoprim (BACTRIM DS) 800-160 MG tablet    Sig: Take 1 tablet by mouth 2 (two) times daily for 7 days.    Dispense:  14 tablet    Refill:  0    Supervising Provider:   DE Peru, RAYMOND J [id number]  No images are attached to the encounter or orders placed in the encounter.  Return if symptoms worsen or fail to improve.    Patient to reach out to office if new, worrisome, or unresolved symptoms arise or if no improvement in patient's condition. Patient verbalized understanding and is agreeable to treatment plan. All questions answered to patient's satisfaction.    Jeff Johnson, [place]

## 2023-08-11 LAB — URINE CULTURE

## 2023-08-14 ENCOUNTER — Ambulatory Visit (HOSPITAL_BASED_OUTPATIENT_CLINIC_OR_DEPARTMENT_OTHER): Payer: Self-pay | Admitting: Family Medicine

## 2023-08-16 ENCOUNTER — Encounter (HOSPITAL_BASED_OUTPATIENT_CLINIC_OR_DEPARTMENT_OTHER): Payer: Medicare Other

## 2023-08-16 NOTE — Telephone Encounter (Signed)
 I called pt to discuss his contrast allergy and he stated that the last time he tried to have this done with being pre medicated he ended up spending 3 days in the hospital from the reaction.   I have messaged Dr. Marven Slimmer to ask if he prefers to switch pt to a TEE

## 2023-08-26 ENCOUNTER — Encounter: Payer: Self-pay | Admitting: Emergency Medicine

## 2023-08-26 ENCOUNTER — Ambulatory Visit (INDEPENDENT_AMBULATORY_CARE_PROVIDER_SITE_OTHER): Payer: Medicare Other

## 2023-08-26 ENCOUNTER — Telehealth: Payer: Self-pay

## 2023-08-26 DIAGNOSIS — I441 Atrioventricular block, second degree: Secondary | ICD-10-CM | POA: Diagnosis not present

## 2023-08-26 LAB — CUP PACEART REMOTE DEVICE CHECK
Battery Remaining Longevity: 113 mo
Battery Remaining Percentage: 95.5 %
Battery Voltage: 2.99 V
Brady Statistic AP VP Percent: 0 %
Brady Statistic AP VS Percent: 0 %
Brady Statistic AS VP Percent: 0 %
Brady Statistic AS VS Percent: 0 %
Brady Statistic RA Percent Paced: 1 %
Brady Statistic RV Percent Paced: 45 %
Date Time Interrogation Session: 20250613020021
Implantable Lead Connection Status: 753985
Implantable Lead Connection Status: 753985
Implantable Lead Implant Date: 20241212
Implantable Lead Implant Date: 20241212
Implantable Lead Location: 753859
Implantable Lead Location: 753860
Implantable Pulse Generator Implant Date: 20241212
Lead Channel Impedance Value: 430 Ohm
Lead Channel Impedance Value: 460 Ohm
Lead Channel Pacing Threshold Amplitude: 0.75 V
Lead Channel Pacing Threshold Amplitude: 0.75 V
Lead Channel Pacing Threshold Pulse Width: 0.5 ms
Lead Channel Pacing Threshold Pulse Width: 0.5 ms
Lead Channel Sensing Intrinsic Amplitude: 1.1 mV
Lead Channel Sensing Intrinsic Amplitude: 12 mV
Lead Channel Setting Pacing Amplitude: 1 V
Lead Channel Setting Pacing Amplitude: 1.75 V
Lead Channel Setting Pacing Pulse Width: 0.5 ms
Lead Channel Setting Sensing Sensitivity: 2 mV
Pulse Gen Model: 2272
Pulse Gen Serial Number: 8224079

## 2023-08-26 NOTE — Telephone Encounter (Signed)
 Spoke with the patient and moved his ablation with Dr. Marven Slimmer up to 6/16. Patient confirmed that he will not take any more doses of his Farxiga  prior to his procedure.

## 2023-08-27 ENCOUNTER — Ambulatory Visit: Payer: Self-pay | Admitting: Cardiology

## 2023-08-28 NOTE — Anesthesia Preprocedure Evaluation (Signed)
 Anesthesia Evaluation  Patient identified by MRN, date of birth, ID band Patient awake    Reviewed: Allergy & Precautions, NPO status , Patient's Chart, lab work & pertinent test results  Airway Mallampati: III       Dental no notable dental hx. (+) Teeth Intact, Dental Advisory Given, Missing   Pulmonary former smoker   Pulmonary exam normal breath sounds clear to auscultation       Cardiovascular hypertension, Pt. on medications Normal cardiovascular exam Rhythm:Regular Rate:Normal  Echo 12/22   1. Left ventricular ejection fraction, by estimation, is 55 to 60%. The  left ventricle has normal function. The left ventricle has no regional  wall motion abnormalities. There is mild concentric left ventricular  hypertrophy. Left ventricular diastolic  parameters are consistent with Grade II diastolic dysfunction  (pseudonormalization). Elevated left atrial pressure.   2. Right ventricular systolic function is normal. The right ventricular  size is normal. There is mildly elevated pulmonary artery systolic  pressure. The estimated right ventricular systolic pressure is 35.9 mmHg.   3. Left atrial size was mildly dilated.   4. The mitral valve is normal in structure. No evidence of mitral valve  regurgitation. No evidence of mitral stenosis.   5. The aortic valve has been repaired/replaced. There is mild  calcification of the aortic valve. There is mild thickening of the aortic  valve. Aortic valve regurgitation is not visualized. 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 Procedure Date: 2011. Echo findings are consistent with normal  structure and function of the aortic valve prosthesis. Aortic valve mean  gradient measures 5.0 mmHg. Aortic valve Vmax measures 1.50 m/s.   6. Previous ascending aorta Bentall repair. Aortic dilatation noted.  There is mild dilatation of the aortic root, measuring  40 mm. There is  mild dilatation of the ascending aorta, measuring 41 mm.      Neuro/Psych    GI/Hepatic Neg liver ROS,GERD  Medicated,,  Endo/Other  negative endocrine ROS    Renal/GU negative Renal ROS  negative genitourinary   Musculoskeletal   Abdominal  (+) + obese  Peds  Hematology negative hematology ROS (+)   Anesthesia Other Findings   Reproductive/Obstetrics                             Anesthesia Physical Anesthesia Plan  ASA: 3  Anesthesia Plan: General   Post-op Pain Management: Minimal or no pain anticipated   Induction: Intravenous  PONV Risk Score and Plan: 1 and Ondansetron  and Dexamethasone   Airway Management Planned: Oral ETT  Additional Equipment: None  Intra-op Plan:   Post-operative Plan: Extubation in OR  Informed Consent: I have reviewed the patients History and Physical, chart, labs and discussed the procedure including the risks, benefits and alternatives for the proposed anesthesia with the patient or authorized representative who has indicated his/her understanding and acceptance.       Plan Discussed with: CRNA and Anesthesiologist  Anesthesia Plan Comments: (  )        Anesthesia Quick Evaluation

## 2023-08-29 ENCOUNTER — Ambulatory Visit (HOSPITAL_COMMUNITY): Payer: Self-pay | Admitting: Anesthesiology

## 2023-08-29 ENCOUNTER — Ambulatory Visit (HOSPITAL_BASED_OUTPATIENT_CLINIC_OR_DEPARTMENT_OTHER): Payer: Self-pay | Admitting: Anesthesiology

## 2023-08-29 ENCOUNTER — Ambulatory Visit (HOSPITAL_COMMUNITY)

## 2023-08-29 ENCOUNTER — Encounter (HOSPITAL_COMMUNITY): Payer: Self-pay | Admitting: Cardiology

## 2023-08-29 ENCOUNTER — Other Ambulatory Visit: Payer: Self-pay

## 2023-08-29 ENCOUNTER — Encounter (HOSPITAL_COMMUNITY)
Admission: RE | Disposition: A | Discharge status: Home / Self Care | Payer: Self-pay | Source: Home / Self Care | Attending: Cardiology

## 2023-08-29 ENCOUNTER — Ambulatory Visit (HOSPITAL_COMMUNITY)
Admission: RE | Admit: 2023-08-29 | Discharge: 2023-08-30 | Disposition: A | Discharge status: Home / Self Care | Attending: Cardiology | Admitting: Cardiology

## 2023-08-29 DIAGNOSIS — Z7901 Long term (current) use of anticoagulants: Secondary | ICD-10-CM | POA: Insufficient documentation

## 2023-08-29 DIAGNOSIS — K219 Gastro-esophageal reflux disease without esophagitis: Secondary | ICD-10-CM | POA: Diagnosis not present

## 2023-08-29 DIAGNOSIS — I4819 Other persistent atrial fibrillation: Secondary | ICD-10-CM

## 2023-08-29 DIAGNOSIS — Z01818 Encounter for other preprocedural examination: Secondary | ICD-10-CM

## 2023-08-29 DIAGNOSIS — I441 Atrioventricular block, second degree: Secondary | ICD-10-CM | POA: Diagnosis not present

## 2023-08-29 DIAGNOSIS — Z87891 Personal history of nicotine dependence: Secondary | ICD-10-CM | POA: Insufficient documentation

## 2023-08-29 DIAGNOSIS — I351 Nonrheumatic aortic (valve) insufficiency: Secondary | ICD-10-CM | POA: Diagnosis not present

## 2023-08-29 DIAGNOSIS — Z953 Presence of xenogenic heart valve: Secondary | ICD-10-CM | POA: Diagnosis not present

## 2023-08-29 DIAGNOSIS — Z950 Presence of cardiac pacemaker: Secondary | ICD-10-CM | POA: Insufficient documentation

## 2023-08-29 DIAGNOSIS — I5033 Acute on chronic diastolic (congestive) heart failure: Secondary | ICD-10-CM | POA: Diagnosis not present

## 2023-08-29 DIAGNOSIS — Z8546 Personal history of malignant neoplasm of prostate: Secondary | ICD-10-CM | POA: Diagnosis not present

## 2023-08-29 DIAGNOSIS — Z79899 Other long term (current) drug therapy: Secondary | ICD-10-CM | POA: Insufficient documentation

## 2023-08-29 DIAGNOSIS — I480 Paroxysmal atrial fibrillation: Secondary | ICD-10-CM | POA: Diagnosis not present

## 2023-08-29 DIAGNOSIS — I3139 Other pericardial effusion (noninflammatory): Secondary | ICD-10-CM | POA: Diagnosis not present

## 2023-08-29 DIAGNOSIS — I10 Essential (primary) hypertension: Secondary | ICD-10-CM | POA: Diagnosis not present

## 2023-08-29 DIAGNOSIS — R001 Bradycardia, unspecified: Secondary | ICD-10-CM | POA: Insufficient documentation

## 2023-08-29 DIAGNOSIS — I340 Nonrheumatic mitral (valve) insufficiency: Secondary | ICD-10-CM

## 2023-08-29 DIAGNOSIS — I110 Hypertensive heart disease with heart failure: Secondary | ICD-10-CM

## 2023-08-29 DIAGNOSIS — E785 Hyperlipidemia, unspecified: Secondary | ICD-10-CM | POA: Insufficient documentation

## 2023-08-29 DIAGNOSIS — I4891 Unspecified atrial fibrillation: Secondary | ICD-10-CM | POA: Diagnosis not present

## 2023-08-29 DIAGNOSIS — I5032 Chronic diastolic (congestive) heart failure: Secondary | ICD-10-CM | POA: Insufficient documentation

## 2023-08-29 HISTORY — PX: TRANSESOPHAGEAL ECHOCARDIOGRAM (CATH LAB): EP1270

## 2023-08-29 HISTORY — PX: ATRIAL FIBRILLATION ABLATION: EP1191

## 2023-08-29 LAB — CBC
HCT: 43.4 % (ref 39.0–52.0)
Hemoglobin: 14.1 g/dL (ref 13.0–17.0)
MCH: 29 pg (ref 26.0–34.0)
MCHC: 32.5 g/dL (ref 30.0–36.0)
MCV: 89.3 fL (ref 80.0–100.0)
Platelets: 147 10*3/uL — ABNORMAL LOW (ref 150–400)
RBC: 4.86 MIL/uL (ref 4.22–5.81)
RDW: 13.8 % (ref 11.5–15.5)
WBC: 5.8 10*3/uL (ref 4.0–10.5)
nRBC: 0 % (ref 0.0–0.2)

## 2023-08-29 LAB — ECHO TEE
AV Mean grad: 10 mmHg
AV Peak grad: 15.2 mmHg
Ao pk vel: 1.95 m/s

## 2023-08-29 LAB — POCT I-STAT, CHEM 8
BUN: 19 mg/dL (ref 8–23)
Calcium, Ion: 1.16 mmol/L (ref 1.15–1.40)
Chloride: 104 mmol/L (ref 98–111)
Creatinine, Ser: 1.2 mg/dL (ref 0.61–1.24)
Glucose, Bld: 106 mg/dL — ABNORMAL HIGH (ref 70–99)
HCT: 45 % (ref 39.0–52.0)
Hemoglobin: 15.3 g/dL (ref 13.0–17.0)
Potassium: 3.9 mmol/L (ref 3.5–5.1)
Sodium: 140 mmol/L (ref 135–145)
TCO2: 23 mmol/L (ref 22–32)

## 2023-08-29 LAB — POCT ACTIVATED CLOTTING TIME: Activated Clotting Time: 331 s

## 2023-08-29 MED ORDER — COLCHICINE 0.6 MG PO TABS
0.6000 mg | ORAL_TABLET | Freq: Two times a day (BID) | ORAL | Status: DC
  Administered 2023-08-29 – 2023-08-30 (×3): 0.6 mg via ORAL
  Filled 2023-08-29 (×3): qty 1

## 2023-08-29 MED ORDER — DEXAMETHASONE SODIUM PHOSPHATE 10 MG/ML IJ SOLN
INTRAMUSCULAR | Status: DC | PRN
  Administered 2023-08-29: 10 mg via INTRAVENOUS

## 2023-08-29 MED ORDER — LIDOCAINE-EPINEPHRINE 1 %-1:100000 IJ SOLN
INTRAMUSCULAR | Status: AC
  Filled 2023-08-29: qty 1

## 2023-08-29 MED ORDER — ONDANSETRON HCL 4 MG/2ML IJ SOLN
INTRAMUSCULAR | Status: DC | PRN
  Administered 2023-08-29: 4 mg via INTRAVENOUS

## 2023-08-29 MED ORDER — ACETAMINOPHEN 325 MG PO TABS: 650.0000 mg | ORAL_TABLET | ORAL | Status: DC | PRN

## 2023-08-29 MED ORDER — PANTOPRAZOLE SODIUM 40 MG PO TBEC
40.0000 mg | DELAYED_RELEASE_TABLET | Freq: Every day | ORAL | Status: DC
  Administered 2023-08-29 – 2023-08-30 (×2): 40 mg via ORAL
  Filled 2023-08-29 (×2): qty 1

## 2023-08-29 MED ORDER — APIXABAN 5 MG PO TABS
5.0000 mg | ORAL_TABLET | Freq: Two times a day (BID) | ORAL | Status: DC
  Administered 2023-08-29 – 2023-08-30 (×3): 5 mg via ORAL
  Filled 2023-08-29 (×4): qty 1

## 2023-08-29 MED ORDER — FENTANYL CITRATE (PF) 250 MCG/5ML IJ SOLN
INTRAMUSCULAR | Status: DC | PRN
  Administered 2023-08-29: 100 ug via INTRAVENOUS

## 2023-08-29 MED ORDER — SODIUM CHLORIDE 0.9% FLUSH: 3.0000 mL | INTRAVENOUS | Status: DC | PRN

## 2023-08-29 MED ORDER — LIDOCAINE-EPINEPHRINE 1 %-1:100000 IJ SOLN
INTRAMUSCULAR | Status: DC | PRN
  Administered 2023-08-29: 20 mL

## 2023-08-29 MED ORDER — CEFAZOLIN SODIUM-DEXTROSE 2-4 GM/100ML-% IV SOLN
INTRAVENOUS | Status: AC
  Filled 2023-08-29: qty 100

## 2023-08-29 MED ORDER — LOSARTAN POTASSIUM 50 MG PO TABS
50.0000 mg | ORAL_TABLET | Freq: Every day | ORAL | Status: DC
  Administered 2023-08-30: 50 mg via ORAL
  Filled 2023-08-29: qty 1

## 2023-08-29 MED ORDER — ONDANSETRON HCL 4 MG/2ML IJ SOLN
4.0000 mg | Freq: Four times a day (QID) | INTRAMUSCULAR | Status: DC | PRN
Start: 2023-08-29 — End: 2023-08-30

## 2023-08-29 MED ORDER — HEPARIN (PORCINE) IN NACL 1000-0.9 UT/500ML-% IV SOLN
INTRAVENOUS | Status: DC | PRN
  Administered 2023-08-29 (×3): 500 mL

## 2023-08-29 MED ORDER — LIDOCAINE 2% (20 MG/ML) 5 ML SYRINGE
INTRAMUSCULAR | Status: DC | PRN
  Administered 2023-08-29: 100 mg via INTRAVENOUS

## 2023-08-29 MED ORDER — PHENYLEPHRINE HCL-NACL 20-0.9 MG/250ML-% IV SOLN
INTRAVENOUS | Status: DC | PRN
  Administered 2023-08-29: 20 ug/min via INTRAVENOUS

## 2023-08-29 MED ORDER — ROCURONIUM BROMIDE 10 MG/ML (PF) SYRINGE
PREFILLED_SYRINGE | INTRAVENOUS | Status: DC | PRN
  Administered 2023-08-29: 20 mg via INTRAVENOUS
  Administered 2023-08-29: 10 mg via INTRAVENOUS
  Administered 2023-08-29: 70 mg via INTRAVENOUS

## 2023-08-29 MED ORDER — ATROPINE SULFATE 1 MG/10ML IJ SOSY
PREFILLED_SYRINGE | INTRAMUSCULAR | Status: DC | PRN
Start: 2023-08-29 — End: 2023-08-29
  Administered 2023-08-29: 1 mg via INTRAVENOUS

## 2023-08-29 MED ORDER — PROTAMINE SULFATE 10 MG/ML IV SOLN
INTRAVENOUS | Status: DC | PRN
  Administered 2023-08-29: 40 mg via INTRAVENOUS
  Administered 2023-08-29: 10 mg via INTRAVENOUS

## 2023-08-29 MED ORDER — PHENYLEPHRINE 80 MCG/ML (10ML) SYRINGE FOR IV PUSH (FOR BLOOD PRESSURE SUPPORT)
PREFILLED_SYRINGE | INTRAVENOUS | Status: DC | PRN
  Administered 2023-08-29: 80 ug via INTRAVENOUS

## 2023-08-29 MED ORDER — SODIUM CHLORIDE 0.9 % IV SOLN: INTRAVENOUS | Status: DC

## 2023-08-29 MED ORDER — SUGAMMADEX SODIUM 200 MG/2ML IV SOLN
INTRAVENOUS | Status: DC | PRN
  Administered 2023-08-29: 400 mg via INTRAVENOUS

## 2023-08-29 MED ORDER — FENTANYL CITRATE (PF) 100 MCG/2ML IJ SOLN
INTRAMUSCULAR | Status: AC
  Filled 2023-08-29: qty 2

## 2023-08-29 MED ORDER — CEFAZOLIN SODIUM-DEXTROSE 2-3 GM-%(50ML) IV SOLR
INTRAVENOUS | Status: DC | PRN
Start: 2023-08-29 — End: 2023-08-29
  Administered 2023-08-29: 2 g via INTRAVENOUS

## 2023-08-29 MED ORDER — SODIUM CHLORIDE 0.9% FLUSH
3.0000 mL | Freq: Two times a day (BID) | INTRAVENOUS | Status: DC
  Administered 2023-08-29: 3 mL via INTRAVENOUS

## 2023-08-29 MED ORDER — TAMSULOSIN HCL 0.4 MG PO CAPS
0.4000 mg | ORAL_CAPSULE | Freq: Every day | ORAL | Status: DC
  Administered 2023-08-29 – 2023-08-30 (×2): 0.4 mg via ORAL
  Filled 2023-08-29 (×2): qty 1

## 2023-08-29 MED ORDER — PROPOFOL 10 MG/ML IV BOLUS
INTRAVENOUS | Status: DC | PRN
  Administered 2023-08-29: 200 mg via INTRAVENOUS

## 2023-08-29 MED ORDER — SODIUM CHLORIDE 0.9 % IV SOLN: 250.0000 mL | INTRAVENOUS | Status: DC | PRN

## 2023-08-29 MED ORDER — HEPARIN SODIUM (PORCINE) 1000 UNIT/ML IJ SOLN
INTRAMUSCULAR | Status: DC | PRN
  Administered 2023-08-29: 16000 [IU] via INTRAVENOUS

## 2023-08-29 SURGICAL SUPPLY — 17 items
CABLE PFA RX CATH CONN (CABLE) IMPLANT
CATH FARAWAVE ABLATION 31 (CATHETERS) IMPLANT
CATH GE 8FR SOUNDSTAR (CATHETERS) IMPLANT
CATH OCTARAY 2.0 F 3-3-3-3-3 (CATHETERS) IMPLANT
CATH WEBSTER BI DIR CS D-F CRV (CATHETERS) IMPLANT
CLOSURE PERCLOSE PROSTYLE (VASCULAR PRODUCTS) IMPLANT
COVER SWIFTLINK CONNECTOR (BAG) ×1 IMPLANT
DILATOR VESSEL 38 20CM 16FR (INTRODUCER) IMPLANT
GUIDEWIRE INQWIRE 1.5J.035X260 (WIRE) IMPLANT
KIT VERSACROSS CNCT FARADRIVE (KITS) IMPLANT
PACK EP LF (CUSTOM PROCEDURE TRAY) ×1 IMPLANT
PAD DEFIB RADIO PHYSIO CONN (PAD) ×1 IMPLANT
PATCH CARTO3 (PAD) IMPLANT
SHEATH FARADRIVE STEERABLE (SHEATH) IMPLANT
SHEATH PINNACLE 8F 10CM (SHEATH) IMPLANT
SHEATH PINNACLE 9F 10CM (SHEATH) IMPLANT
WIRE HI TORQ VERSACORE-J 145CM (WIRE) IMPLANT

## 2023-08-29 NOTE — Interval H&P Note (Signed)
 History and Physical Interval Note:  08/29/2023 10:01 AM  Jeff Johnson  has presented today for surgery, with the diagnosis of Afib.  The various methods of treatment have been discussed with the patient and family. After consideration of risks, benefits and other options for treatment, the patient has consented to  Procedure(s): ATRIAL FIBRILLATION ABLATION (N/A) TRANSESOPHAGEAL ECHOCARDIOGRAM (N/A) as a surgical intervention.  The patient's history has been reviewed, patient examined, no change in status, stable for surgery.  I have reviewed the patient's chart and labs.  Questions were answered to the patient's satisfaction.     Braedyn Kauk

## 2023-08-29 NOTE — H&P (Signed)
 Electrophysiology Office Note:     Date:  08/29/2023    ID:  Jeff Johnson, DOB 1945-07-19, MRN [id number]   [facility] Cardiologist:  Sheryle Donning, MD  [facility] Electrophysiologist:  Ardeen Kohler, MD    Referring MD: Nathanel Bal, PA-C    Chief Complaint: Atrial fibrillation   History of Present Illness:     [name] is an 78 year old man who I am seeing today for evaluation of atrial fibrillation at the request of Waynette Hait.  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  His atrial fibrillation was first detected by his pacemaker in March of this year.  He is on Eliquis  twice daily for stroke prophylaxis.   When he saw Autry Legions in the A-fib clinic on July 05, 2023 treatment options were discussed with the patient.  The patient elected to start amiodarone  for rhythm control.  Catheter ablation was discussed but the decision was made to trial medication therapy first.     He called the office Jul 14, 2023 reporting nausea, reflux while on amiodarone .  He stopped this medication.  He is referred to discuss catheter ablation  for his arrhythmia.   He is with his wife today in clinic.   Presents for aF ablation. Procedure reviewed.       Objective Their past medical, social and family history was reviewed.     ROS:   Please see the history of present illness.    All other systems reviewed and are negative.   EKGs/Labs/Other Studies Reviewed:     The following studies were reviewed today:   July 05, 2023 EKG reviewed.  Atrial fibrillation.  QTc 438 ms.     Jul 20, 2023 implant device interrogation personally reviewed Battery longevity 9.7 years Lead parameter stable Presenting rhythm is atrial fibrillation Atrial pacing 12% Ventricular pacing 74%          Physical  Exam:     VS:  BP 138/87 (BP Location: Left Arm, Patient Position: Sitting, Cuff Size: Large)   Pulse 88   Ht 6' (1.829 m)   Wt 246 lb (111.6 kg)   SpO2 98%   BMI 33.36 kg/m         Wt Readings from Last 3 Encounters:  07/20/23 246 lb (111.6 kg)  07/05/23 247 lb (112 kg)  06/27/23 248 lb (112.5 kg)      GEN: no distress.  Obese CARD: Irregularly irregular, No MRG RESP: No IWOB. CTAB.         Assessment ASSESSMENT AND PLAN:     1. Atrial fibrillation, unspecified type (HCC)   2. Symptomatic bradycardia   3. Cardiac pacemaker in situ       #Persistent atrial fibrillation Symptomatic. On Eliquis  for stroke prophylaxis Did not tolerate amiodarone    I discussed treatment options with the patient during today's clinic appointment.  We discussed Tikosyn and atrial fibrillation ablation.  He is interested in proceeding with catheter ablation.  I discussed the ablation procedure in detail including the risks and recovery and he wishes to proceed.   Discussed treatment options today for AF including antiarrhythmic drug therapy and ablation. Discussed risks, recovery and likelihood of success with each treatment strategy. Risk, benefits, and alternatives to EP study and ablation for afib were discussed. These risks include but are not limited to stroke, bleeding, vascular damage, tamponade, perforation,  damage to the esophagus, lungs, phrenic nerve and other structures, pulmonary vein stenosis, worsening renal function, coronary vasospasm and death.  Discussed potential need for repeat ablation procedures and antiarrhythmic drugs after an initial ablation. The patient understands these risk and wishes to proceed.  We will therefore proceed with catheter ablation at the next available time.  Carto, ICE, anesthesia are requested for the procedure.  Will also obtain CT PV protocol prior to the procedure to exclude LAA thrombus and further evaluate atrial anatomy.   Ablation strategy would  be PVI plus posterior wall     #Symptomatic bradycardia #Permanent pacemaker in situ Transfer remotes   Presents for AF ablation. Procedure reviewed.     Signed, Leanora Prophet. Marven Slimmer, MD, [facility], [facility] 08/29/2023 Electrophysiology [facility]

## 2023-08-29 NOTE — CV Procedure (Signed)
    TRANSESOPHAGEAL ECHOCARDIOGRAM   NAME:  Jeff Johnson   MRN: [id number] DOB:  24-Apr-1945   ADMIT DATE: 08/29/2023  INDICATIONS:  Atrial fibrillation  PROCEDURE:   Informed consent was obtained prior to the procedure. The risks, benefits and alternatives for the procedure were discussed and the patient comprehended these risks.  Risks include, but are not limited to, cough, sore throat, vomiting, nausea, somnolence, esophageal and stomach trauma or perforation, bleeding, low blood pressure, aspiration, pneumonia, infection, trauma to the teeth and death.    After a procedural time-out, the patient was intubated/sedated by the anesthesia service. Once an appropriate level of sedation was achieved, the transesophageal probe was inserted in the esophagus and stomach without difficulty and multiple views were obtained.    COMPLICATIONS:    There were no immediate complications.  FINDINGS:  LEFT VENTRICLE: EF = 40-45%. Septal HK  RIGHT VENTRICLE: Mld HK  LEFT ATRIUM: Severe LAE  LEFT ATRIAL APPENDAGE: No thrombus.   RIGHT ATRIUM: Normal + pacer wire  AORTIC VALVE:  s/p replacement. Mild turbulence over the valve. No significant AS or AI  MITRAL VALVE:    Normal. Mild MR  TRICUSPID VALVE: Normal. Mild TR  PULMONIC VALVE: Grossly normal.   INTERATRIAL SEPTUM: No PFO or ASD. + lipomatous hypertrophy  PERICARDIUM: Small effusion along RV free wall and apex  DESCENDING AORTA: Moderate to severe plaque     Lavenia Post 10:37 AM

## 2023-08-29 NOTE — Anesthesia Procedure Notes (Addendum)
 Procedure Name: Intubation Date/Time: 08/29/2023 10:16 AM  Performed by: Jamas Maywood, CRNAPre-anesthesia Checklist: Patient identified, Emergency Drugs available, Suction available and Patient being monitored Patient Re-evaluated:Patient Re-evaluated prior to induction Oxygen Delivery Method: Circle system utilized Preoxygenation: Pre-oxygenation with 100% oxygen Induction Type: IV induction Ventilation: Mask ventilation without difficulty and Oral airway inserted - appropriate to patient size Laryngoscope Size: Mac and 4 Grade View: Grade II Tube type: Oral Tube size: 7.5 mm Number of attempts: 1 Airway Equipment and Method: Stylet and Oral airway Placement Confirmation: ETT inserted through vocal cords under direct vision, positive ETCO2 and breath sounds checked- equal and bilateral Secured at: 23 cm Tube secured with: Tape Dental Injury: Teeth and Oropharynx as per pre-operative assessment

## 2023-08-29 NOTE — Transfer of Care (Signed)
 Immediate Anesthesia Transfer of Care Note  Patient: Jeff Johnson  Procedure(s) Performed: ATRIAL FIBRILLATION ABLATION TRANSESOPHAGEAL ECHOCARDIOGRAM  Patient Location: PACU and Cath Lab  Anesthesia Type:General  Level of Consciousness: awake, alert , and oriented  Airway & Oxygen Therapy: Patient Spontanous Breathing  Post-op Assessment: Report given to RN and Post -op Vital signs reviewed and stable  Post vital signs: Reviewed and stable  Last Vitals:  Vitals Value Taken Time  BP 115/72   Temp    Pulse 80 08/29/23 12:27  Resp 12   SpO2 97 % 08/29/23 12:27  Vitals shown include unfiled device data.  Last Pain:  Vitals:   08/29/23 0831  TempSrc: Oral  PainSc:          Complications: There were no known notable events for this encounter.

## 2023-08-29 NOTE — Discharge Instructions (Signed)

## 2023-08-29 NOTE — Progress Notes (Signed)
 Report called to sarah RN on 6E. Patient transported on telemetry, wife with patient, bag of belongings and cane in room. Bedisde assessment completed and sites are CDI.

## 2023-08-30 ENCOUNTER — Other Ambulatory Visit (HOSPITAL_COMMUNITY): Payer: Self-pay

## 2023-08-30 DIAGNOSIS — E785 Hyperlipidemia, unspecified: Secondary | ICD-10-CM | POA: Diagnosis not present

## 2023-08-30 DIAGNOSIS — I4819 Other persistent atrial fibrillation: Secondary | ICD-10-CM | POA: Diagnosis not present

## 2023-08-30 DIAGNOSIS — Z7901 Long term (current) use of anticoagulants: Secondary | ICD-10-CM | POA: Diagnosis not present

## 2023-08-30 DIAGNOSIS — R001 Bradycardia, unspecified: Secondary | ICD-10-CM | POA: Diagnosis not present

## 2023-08-30 DIAGNOSIS — I110 Hypertensive heart disease with heart failure: Secondary | ICD-10-CM | POA: Diagnosis not present

## 2023-08-30 DIAGNOSIS — Z79899 Other long term (current) drug therapy: Secondary | ICD-10-CM | POA: Diagnosis not present

## 2023-08-30 DIAGNOSIS — K219 Gastro-esophageal reflux disease without esophagitis: Secondary | ICD-10-CM | POA: Diagnosis not present

## 2023-08-30 DIAGNOSIS — Z8546 Personal history of malignant neoplasm of prostate: Secondary | ICD-10-CM | POA: Diagnosis not present

## 2023-08-30 DIAGNOSIS — I4891 Unspecified atrial fibrillation: Secondary | ICD-10-CM | POA: Diagnosis not present

## 2023-08-30 DIAGNOSIS — I5032 Chronic diastolic (congestive) heart failure: Secondary | ICD-10-CM | POA: Diagnosis not present

## 2023-08-30 DIAGNOSIS — Z953 Presence of xenogenic heart valve: Secondary | ICD-10-CM | POA: Diagnosis not present

## 2023-08-30 DIAGNOSIS — Z950 Presence of cardiac pacemaker: Secondary | ICD-10-CM | POA: Diagnosis not present

## 2023-08-30 DIAGNOSIS — I351 Nonrheumatic aortic (valve) insufficiency: Secondary | ICD-10-CM | POA: Diagnosis not present

## 2023-08-30 DIAGNOSIS — I3139 Other pericardial effusion (noninflammatory): Secondary | ICD-10-CM | POA: Diagnosis not present

## 2023-08-30 DIAGNOSIS — Z87891 Personal history of nicotine dependence: Secondary | ICD-10-CM | POA: Diagnosis not present

## 2023-08-30 DIAGNOSIS — I441 Atrioventricular block, second degree: Secondary | ICD-10-CM | POA: Diagnosis not present

## 2023-08-30 MED ORDER — COLCHICINE 0.6 MG PO TABS
0.6000 mg | ORAL_TABLET | Freq: Two times a day (BID) | ORAL | 0 refills | Status: DC
  Filled 2023-08-30: qty 10, 5d supply, fill #0

## 2023-08-30 MED ORDER — PANTOPRAZOLE SODIUM 40 MG PO TBEC
40.0000 mg | DELAYED_RELEASE_TABLET | Freq: Every day | ORAL | 0 refills | Status: DC
  Filled 2023-08-30: qty 45, 45d supply, fill #0

## 2023-08-30 MED FILL — Fentanyl Citrate Preservative Free (PF) Inj 100 MCG/2ML: INTRAMUSCULAR | Qty: 2 | Status: AC

## 2023-08-30 NOTE — Discharge Summary (Signed)
 ELECTROPHYSIOLOGY PROCEDURE DISCHARGE SUMMARY    Patient ID: Jeff Johnson,  MRN: [id number], DOB/AGE: 1945-10-25 78 y.o.  Admit date: 08/29/2023 Discharge date: 08/30/2023  Primary Care Physician: de [place], Alonza Jansky, MD  Primary Cardiologist: Sheryle Donning, MD  Electrophysiologist: Dr. Marven Slimmer   Primary Discharge Diagnosis:  Atrial Fibrillation   Procedures This Admission:  Electrophysiology study and Pulse Field catheter ablation of Atrial Fibrillation on 08/29/2023 by Dr. Marven Slimmer .  This study demonstrated:   1. Successful PVI 2. Successful ablation/isolation of the posterior wall 3. Intracardiac echo reveals trivial pericardial effusion, dilated LA, thickened/calcified interatrial septum 4. TEE demonstrated small effusion outside the RV apex present pre procedure, dilated LA 5. No early apparent complications. 6. Colchicine 0.6mg  PO BID x 5 days 7. Protonix  40mg  PO daily x 45 days      Brief HPI: Jeff Johnson is a 78 y.o. male with a history of Atrial Fibrillation.  Risks, benefits, and alternatives to catheter ablation of Atrial Fibrillation were reviewed with the patient who wished to proceed.   The patient has been on uninterrupted anticoagulation for more than 3 weeks and did not require TEE.  Hospital Course:  The patient was admitted and underwent EPS/RFCA of Atrial Fibrillation with details as outlined above.  They were monitored on telemetry overnight which demonstrated NSR.  Groin was without complication on the day of discharge.  The patient was examined and considered to be stable for discharge.  Wound care and restrictions were reviewed with the patient.  The patient will be seen back by [facility] in 4 weeks and Dr. Marven Slimmer in 12 weeks for post ablation follow up.   Physical Exam: Vitals:   08/29/23 2007 08/30/23 0100 08/30/23 0603 08/30/23 0745  BP: 103/73 111/62 124/75 132/81  Pulse:    68  Resp: 17 16 18 17   Temp: 98.1 F (36.7 C) 98 F  (36.7 C) (!) 97.5 F (36.4 C) 97.6 F (36.4 C)  TempSrc: Oral Oral Oral Oral  SpO2:    99%  Weight:      Height:        GEN- NAD. A&O x 3.  HEENT: Normocephalic, atraumatic Lungs- CTAB, Normal effort.  Heart- RRR, No M/G/R.  GI- Soft, NT, ND.  Extremities- No clubbing, cyanosis, or edema;  Skin- warm and dry, no rash or lesion, left chest without hematoma/ecchymosis  Discharge Medications:  Allergies as of 08/30/2023       Reactions   Beta Adrenergic Blockers Other (See Comments)   Remote reportedh/o intolerance due to weakness per notes.Aaron AasAaron AasAaron AasCalled patient. Advised patient of provider's approval for requested procedure, as well as any comments/instructions from provider.    Iodinated Contrast Media Anaphylaxis   Shellfish Allergy Anaphylaxis, Other (See Comments)   Shellfish-derived Products Anaphylaxis        Medication List     PAUSE taking these medications    lansoprazole  15 MG capsule Wait to take this until: October 14, 2023 Commonly known as: PREVACID  Take 1 capsule (15 mg total) by mouth daily at 12 noon. What changed: when to take this       TAKE these medications    acetaminophen  650 MG CR tablet Commonly known as: TYLENOL  Take 1,300 mg by mouth 3 (three) times daily as needed for pain or fever.   ascorbic acid 500 MG tablet Commonly known as: VITAMIN C Take 500 mg by mouth in the morning.   ASPIRIN  81 PO Take 1 tablet by mouth in the morning.  benzonatate  200 MG capsule Commonly known as: TESSALON  Take 1 capsule (200 mg total) by mouth 3 (three) times daily as needed for cough.   colchicine 0.6 MG tablet Take 1 tablet (0.6 mg total) by mouth 2 (two) times daily for 5 days.   Eliquis  5 MG Tabs tablet Generic drug: apixaban  Take 1 tablet (5 mg total) by mouth 2 (two) times daily.   Farxiga  10 MG Tabs tablet Generic drug: dapagliflozin  propanediol Take 1 tablet (10 mg total) by mouth daily before breakfast.   furosemide  20 MG  tablet Commonly known as: LASIX  Take 1 tablet (20 mg total) by mouth daily. Take additional 20mg  as needed for weight gain of 2 lbs overnight of 5 lbs in one week. What changed: when to take this   losartan  50 MG tablet Commonly known as: COZAAR  Take 1 tablet (50 mg total) by mouth daily. What changed: when to take this   montelukast  10 MG tablet Commonly known as: SINGULAIR  Take 1 tablet (10 mg total) by mouth daily. What changed: when to take this   pantoprazole  40 MG tablet Commonly known as: PROTONIX  Take 1 tablet (40 mg total) by mouth daily.   PreserVision AREDS 2 Caps Take 1 capsule by mouth in the morning and at bedtime.   ROLAIDS PO Take 4 tablets by mouth as needed.   simvastatin  20 MG tablet Commonly known as: ZOCOR  Take 1 tablet (20 mg total) by mouth daily. What changed: when to take this   tamsulosin  0.4 MG Caps capsule Commonly known as: FLOMAX  Take 1 capsule (0.4 mg total) by mouth daily. What changed: when to take this   VITAMIN D  PO Take 5,000 Units by mouth in the morning.   zinc gluconate 50 MG tablet Take 50-75 mg by mouth in the morning. Patient takes 50/75 daily        Disposition: Home with usual follow up as in AVS  Duration of Discharge Encounter:  APP time: 26 minutes  Signed, Tylene Galla, PA-C  08/30/2023 8:19 AM

## 2023-08-30 NOTE — Plan of Care (Signed)
  Problem: Activity: Goal: Ability to return to baseline activity level will improve Outcome: Progressing   Problem: Cardiac: Goal: Ability to maintain adequate cardiovascular perfusion will improve Outcome: Progressing   Problem: Health Behavior/Discharge Planning: Goal: Ability to manage health-related needs will improve Outcome: Progressing   Problem: Pain Managment: Goal: General experience of comfort will improve and/or be controlled Outcome: Progressing   Problem: Safety: Goal: Ability to remain free from injury will improve Outcome: Progressing

## 2023-08-30 NOTE — Anesthesia Postprocedure Evaluation (Signed)
 Anesthesia Post Note  Patient: Jeff Johnson  Procedure(s) Performed: ATRIAL FIBRILLATION ABLATION TRANSESOPHAGEAL ECHOCARDIOGRAM     Patient location during evaluation: PACU Anesthesia Type: General Level of consciousness: awake and alert Pain management: pain level controlled Vital Signs Assessment: post-procedure vital signs reviewed and stable Respiratory status: spontaneous breathing, nonlabored ventilation, respiratory function stable and patient connected to nasal cannula oxygen Cardiovascular status: blood pressure returned to baseline and stable Postop Assessment: no apparent nausea or vomiting Anesthetic complications: no   There were no known notable events for this encounter.  Last Vitals:  Vitals:   08/30/23 0603 08/30/23 0745  BP: 124/75 132/81  Pulse:  68  Resp: 18 17  Temp: (!) 36.4 C 36.4 C  SpO2:  99%    Last Pain:  Vitals:   08/30/23 0745  TempSrc: Oral  PainSc:    Pain Goal:                   Teckla Christiansen

## 2023-09-06 ENCOUNTER — Ambulatory Visit (HOSPITAL_BASED_OUTPATIENT_CLINIC_OR_DEPARTMENT_OTHER): Admitting: *Deleted

## 2023-09-06 ENCOUNTER — Encounter (HOSPITAL_BASED_OUTPATIENT_CLINIC_OR_DEPARTMENT_OTHER): Payer: Self-pay

## 2023-09-06 VITALS — Ht 72.0 in | Wt 230.0 lb

## 2023-09-06 DIAGNOSIS — Z0000 Encounter for general adult medical examination without abnormal findings: Secondary | ICD-10-CM | POA: Diagnosis not present

## 2023-09-06 NOTE — Patient Instructions (Signed)
 Mr. Roblero , Thank you for taking time to come for your Medicare Wellness Visit. I appreciate your ongoing commitment to your health goals. Please review the following plan we discussed and let me know if I can assist you in the future.   Screening recommendations/referrals: Colonoscopy: no longer needed Recommended yearly ophthalmology/optometry visit for glaucoma screening and checkup Recommended yearly dental visit for hygiene and checkup  Vaccinations: Influenza vaccine: up to date Pneumococcal vaccine: up date Tdap vaccine: up date      Preventive Care 65 Years and Older, Male Preventive care refers to lifestyle choices and visits with your health care provider that can promote health and wellness. What does preventive care include? A yearly physical exam. This is also called an annual well check. Dental exams once or twice a year. Routine eye exams. Ask your health care provider how often you should have your eyes checked. Personal lifestyle choices, including: Daily care of your teeth and gums. Regular physical activity. Eating a healthy diet. Avoiding tobacco and drug use. Limiting alcohol  use. Practicing safe sex. Taking low doses of aspirin  every day. Taking vitamin and mineral supplements as recommended by your health care provider. What happens during an annual well check? The services and screenings done by your health care provider during your annual well check will depend on your age, overall health, lifestyle risk factors, and family history of disease. Counseling  Your health care provider may ask you questions about your: Alcohol  use. Tobacco use. Drug use. Emotional well-being. Home and relationship well-being. Sexual activity. Eating habits. History of falls. Memory and ability to understand (cognition). Work and work Astronomer. Screening  You may have the following tests or measurements: Height, weight, and BMI. Blood pressure. Lipid and  cholesterol levels. These may be checked every 5 years, or more frequently if you are over 69 years old. Skin check. Lung cancer screening. You may have this screening every year starting at age 38 if you have a 30-pack-year history of smoking and currently smoke or have quit within the past 15 years. Fecal occult blood test (FOBT) of the stool. You may have this test every year starting at age 20. Flexible sigmoidoscopy or colonoscopy. You may have a sigmoidoscopy every 5 years or a colonoscopy every 10 years starting at age 20. Prostate cancer screening. Recommendations will vary depending on your family history and other risks. Hepatitis C blood test. Hepatitis B blood test. Sexually transmitted disease (STD) testing. Diabetes screening. This is done by checking your blood sugar (glucose) after you have not eaten for a while (fasting). You may have this done every 1-3 years. Abdominal aortic aneurysm (AAA) screening. You may need this if you are a current or former smoker. Osteoporosis. You may be screened starting at age 47 if you are at high risk. Talk with your health care provider about your test results, treatment options, and if necessary, the need for more tests. Vaccines  Your health care provider may recommend certain vaccines, such as: Influenza vaccine. This is recommended every year. Tetanus, diphtheria, and acellular pertussis (Tdap, Td) vaccine. You may need a Td booster every 10 years. Zoster vaccine. You may need this after age 39. Pneumococcal 13-valent conjugate (PCV13) vaccine. One dose is recommended after age 53. Pneumococcal polysaccharide (PPSV23) vaccine. One dose is recommended after age 13. Talk to your health care provider about which screenings and vaccines you need and how often you need them. This information is not intended to replace advice given to you by  your health care provider. Make sure you discuss any questions you have with your health care  provider. Document Released: 03/28/2015 Document Revised: 11/19/2015 Document Reviewed: 12/31/2014 Elsevier Interactive Patient Education  2017 [employer].  Fall Prevention in the Home Falls can cause injuries. They can happen to people of all ages. There are many things you can do to make your home safe and to help prevent falls. What can I do on the outside of my home? Regularly fix the edges of walkways and driveways and fix any cracks. Remove anything that might make you trip as you walk through a door, such as a raised step or threshold. Trim any bushes or trees on the path to your home. Use bright outdoor lighting. Clear any walking paths of anything that might make someone trip, such as rocks or tools. Regularly check to see if handrails are loose or broken. Make sure that both sides of any steps have handrails. Any raised decks and porches should have guardrails on the edges. Have any leaves, snow, or ice cleared regularly. Use sand or salt on walking paths during winter. Clean up any spills in your garage right away. This includes oil or grease spills. What can I do in the bathroom? Use night lights. Install grab bars by the toilet and in the tub and shower. Do not use towel bars as grab bars. Use non-skid mats or decals in the tub or shower. If you need to sit down in the shower, use a plastic, non-slip stool. Keep the floor dry. Clean up any water that spills on the floor as soon as it happens. Remove soap buildup in the tub or shower regularly. Attach bath mats securely with double-sided non-slip rug tape. Do not have throw rugs and other things on the floor that can make you trip. What can I do in the bedroom? Use night lights. Make sure that you have a light by your bed that is easy to reach. Do not use any sheets or blankets that are too big for your bed. They should not hang down onto the floor. Have a firm chair that has side arms. You can use this for support while  you get dressed. Do not have throw rugs and other things on the floor that can make you trip. What can I do in the kitchen? Clean up any spills right away. Avoid walking on wet floors. Keep items that you use a lot in easy-to-reach places. If you need to reach something above you, use a strong step stool that has a grab bar. Keep electrical cords out of the way. Do not use floor polish or wax that makes floors slippery. If you must use wax, use non-skid floor wax. Do not have throw rugs and other things on the floor that can make you trip. What can I do with my stairs? Do not leave any items on the stairs. Make sure that there are handrails on both sides of the stairs and use them. Fix handrails that are broken or loose. Make sure that handrails are as long as the stairways. Check any carpeting to make sure that it is firmly attached to the stairs. Fix any carpet that is loose or worn. Avoid having throw rugs at the top or bottom of the stairs. If you do have throw rugs, attach them to the floor with carpet tape. Make sure that you have a light switch at the top of the stairs and the bottom of the stairs. If you  do not have them, ask someone to add them for you. What else can I do to help prevent falls? Wear shoes that: Do not have high heels. Have rubber bottoms. Are comfortable and fit you well. Are closed at the toe. Do not wear sandals. If you use a stepladder: Make sure that it is fully opened. Do not climb a closed stepladder. Make sure that both sides of the stepladder are locked into place. Ask someone to hold it for you, if possible. Clearly mark and make sure that you can see: Any grab bars or handrails. First and last steps. Where the edge of each step is. Use tools that help you move around (mobility aids) if they are needed. These include: Canes. Walkers. Scooters. Crutches. Turn on the lights when you go into a dark area. Replace any light bulbs as soon as they burn  out. Set up your furniture so you have a clear path. Avoid moving your furniture around. If any of your floors are uneven, fix them. If there are any pets around you, be aware of where they are. Review your medicines with your doctor. Some medicines can make you feel dizzy. This can increase your chance of falling. Ask your doctor what other things that you can do to help prevent falls. This information is not intended to replace advice given to you by your health care provider. Make sure you discuss any questions you have with your health care provider. Document Released: 12/26/2008 Document Revised: 08/07/2015 Document Reviewed: 04/05/2014 Elsevier Interactive Patient Education  2017 [employer].

## 2023-09-06 NOTE — Progress Notes (Signed)
 Subjective:   Jeff Johnson is a 78 y.o. male who presents for Medicare Annual/Subsequent preventive examination.  Visit Complete: Virtual I connected with  Jeff Johnson on 09/06/23 by a audio enabled telemedicine application and verified that I am speaking with the correct person using two identifiers.  Patient Location: Home  Provider Location: Home Office  I discussed the limitations of evaluation and management by telemedicine. The patient expressed understanding and agreed to proceed.  Vital Signs: Because this visit was a virtual/telehealth visit, some criteria may be missing or patient reported. Any vitals not documented were not able to be obtained and vitals that have been documented are patient reported.  Cardiac Risk Factors include: advanced age (>25men, >66 women);male gender;obesity (BMI >30kg/m2)     Objective:    Today's Vitals   09/06/23 1013  Weight: 230 lb (104.3 kg)  Height: 6' (1.829 m)  PainSc: 0-No pain   Body mass index is 31.19 kg/m.     09/06/2023   10:24 AM 08/29/2023    8:21 AM 02/23/2023   10:17 AM 02/21/2023   10:07 AM 08/10/2022    3:38 PM 05/02/2022    8:37 AM 04/17/2022    9:46 PM  Advanced Directives  Does Patient Have a Medical Advance Directive? No No  No No No No  Would patient like information on creating a medical advance directive? No - Patient declined No - Guardian declined No - Patient declined  No - Patient declined No - Patient declined No - Patient declined    Current Medications (verified) Outpatient Encounter Medications as of 09/06/2023  Medication Sig   acetaminophen  (TYLENOL ) 650 MG CR tablet Take 1,300 mg by mouth 3 (three) times daily as needed for pain or fever.   apixaban  (ELIQUIS ) 5 MG TABS tablet Take 1 tablet (5 mg total) by mouth 2 (two) times daily.   ascorbic acid (VITAMIN C) 500 MG tablet Take 500 mg by mouth in the morning.   ASPIRIN  81 PO Take 1 tablet by mouth in the morning.   benzonatate  (TESSALON ) 200 MG  capsule Take 1 capsule (200 mg total) by mouth 3 (three) times daily as needed for cough.   Cholecalciferol (VITAMIN D  PO) Take 5,000 Units by mouth in the morning.   colchicine  0.6 MG tablet Take 1 tablet (0.6 mg total) by mouth 2 (two) times daily for 5 days.   furosemide  (LASIX ) 20 MG tablet Take 1 tablet (20 mg total) by mouth daily. Take additional 20mg  as needed for weight gain of 2 lbs overnight of 5 lbs in one week. (Patient taking differently: Take 20 mg by mouth in the morning. Take additional 20mg  as needed for weight gain of 2 lbs overnight of 5 lbs in one week.)   [Paused] lansoprazole  (PREVACID ) 15 MG capsule Take 1 capsule (15 mg total) by mouth daily at 12 noon. (Patient taking differently: Take 15 mg by mouth at bedtime.)   losartan  (COZAAR ) 50 MG tablet Take 1 tablet (50 mg total) by mouth daily. (Patient taking differently: Take 50 mg by mouth in the morning.)   montelukast  (SINGULAIR ) 10 MG tablet Take 1 tablet (10 mg total) by mouth daily. (Patient taking differently: Take 10 mg by mouth in the morning.)   Multiple Vitamins-Minerals (PRESERVISION AREDS 2) CAPS Take 1 capsule by mouth in the morning and at bedtime.   pantoprazole  (PROTONIX ) 40 MG tablet Take 1 tablet (40 mg total) by mouth daily. (Pause lansoprazole )   simvastatin  (ZOCOR ) 20 MG  tablet Take 1 tablet (20 mg total) by mouth daily.   tamsulosin  (FLOMAX ) 0.4 MG CAPS capsule Take 1 capsule (0.4 mg total) by mouth daily. (Patient taking differently: Take 0.4 mg by mouth in the morning.)   zinc gluconate 50 MG tablet Take 50-75 mg by mouth in the morning. Patient takes 50/75 daily   dapagliflozin  propanediol (FARXIGA ) 10 MG TABS tablet Take 1 tablet (10 mg total) by mouth daily before breakfast. (Patient not taking: Reported on 09/06/2023)   Dihydroxyaluminum Sod Carb (ROLAIDS PO) Take 4 tablets by mouth as needed.   No facility-administered encounter medications on file as of 09/06/2023.    Allergies (verified) Beta  adrenergic blockers, Iodinated contrast media, Shellfish allergy, and Shellfish-derived products   History: Past Medical History:  Diagnosis Date   Arthritis    Benign essential HTN 05/21/2014   BPH (benign prostatic hyperplasia)    Chronic cystitis    a. With ongoing hematuria.   Contrast media allergy    Diverticulitis    a. 2010: diverticulitis with stricture Sigmoid colectomy with mobilization of the splenic flexure within the end anastomosis.    Dizziness and giddiness    a. H/o dizziness with reportedly negative tilt table. Holter 2012: NSR, sinus tachy, frequent PVCs, multifocal at times in a trigeminal fashion.   Dyspnea on exertion 03/18/2014   Frequency of urination    GERD (gastroesophageal reflux disease)    H/O cardiac catheterization    a. 2011: minor luminal irregularities. b. Nuc 03/2014: ow risk without reversible ischemia or infarction, EF 57%. (There is septal akinesis but otherwise normal wall motion. This is nonspecific.)   Headache    MIGRAINES   Hematuria    History of urinary retention    Hyperlipidemia 03/18/2014   Hyponatremia    a. Remote hx hyponatremia felt 2/2 SSRI.   Nocturia    Obesity    Orthostatic hypotension    Prostate cancer (HCC) 2011   a. s/p radiation.   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 11/2009: pericardial AVR/[place].   SYNCOPE 02/18/2010   Qualifier: Diagnosis of  By: Waddell, MD, CODY Danelle Fallow    Thoracic aortic aneurysm [facility])    a. 11/2009: s/p Magna Ease pericardial tissue valve size 27mm and replacement of fusiform ascending thoracic aneurysm with 30-mm Hemashield cath with hemiarch reconstruction.   Past Surgical History:  Procedure Laterality Date   ATRIAL FIBRILLATION ABLATION N/A 08/29/2023   Procedure: ATRIAL FIBRILLATION ABLATION;  Surgeon: Cindie Ole DASEN, MD;  Location: [facility] INVASIVE CV LAB;  Service: Cardiovascular;  Laterality: N/A;   BACK SURGERY  1985    CARPAL TUNNEL RELEASE     BIL WRISTS   CHOLECYSTECTOMY  2010   COLON SURGERY  2010   colon resection   COLONOSCOPY  2008   [facility]   COLONOSCOPY WITH PROPOFOL  N/A 09/02/2017   Procedure: COLONOSCOPY WITH PROPOFOL ;  Surgeon: Legrand Victory LITTIE DOUGLAS, MD;  Location: [facility] ENDOSCOPY;  Service: Gastroenterology;  Laterality: N/A;   doppler carotid  2012   DOPPLER ECHOCARDIOGRAPHY  2011   PACEMAKER IMPLANT N/A 02/24/2023   Procedure: PACEMAKER IMPLANT;  Surgeon: Kennyth Chew, MD;  Location: [facility] INVASIVE CV LAB;  Service: Cardiovascular;  Laterality: N/A;   POLYPECTOMY  09/02/2017   Procedure: POLYPECTOMY;  Surgeon: Legrand Victory LITTIE DOUGLAS, MD;  Location: [facility] ENDOSCOPY;  Service: Gastroenterology;;   PROSTATE SURGERY  2011   adenocarcinoma w/raddiation  ROTATOR CUFF REPAIR     X2 LEFT SHOULDER   THORACIC AORTIC ANEURYSM REPAIR  12/11/09   ASCENDING THORACIC AORTIC ANEURYSM REPAIR   TISSUE AORTIC VALVE REPLACEMENT  12/11/09   TOTAL HIP ARTHROPLASTY Left 04/17/2015   Procedure: LEFT TOTAL HIP ARTHROPLASTY ANTERIOR APPROACH;  Surgeon: Redell Shoals, MD;  Location: [facility] ORS;  Service: Orthopedics;  Laterality: Left;   TRANSESOPHAGEAL ECHOCARDIOGRAM (CATH LAB) N/A 08/29/2023   Procedure: TRANSESOPHAGEAL ECHOCARDIOGRAM;  Surgeon: Cindie Ole DASEN, MD;  Location[facility] INVASIVE CV LAB;  Service: Cardiovascular;  Laterality: N/A;   Family History  Problem Relation Age of Onset   Stroke Mother 9   Heart disease Father 66   Heart attack Father    Colon cancer Maternal Aunt    Stomach cancer Neg Hx    Esophageal cancer Neg Hx    Social History   Socioeconomic History   Marital status: Married    Spouse name: Not on file   Number of children: Not on file   Years of education: Not on file   Highest education level: Not on file  Occupational History   Not on file  Tobacco Use   Smoking status: Former    Current packs/day: 0.00    Types: Cigarettes    Quit date: 03/16/1967    Years since quitting: 56.5     Passive exposure: Past   Smokeless tobacco: Never  Vaping Use   Vaping status: Never Used  Substance and Sexual Activity   Alcohol  use: No   Drug use: No   Sexual activity: Not Currently    Partners: Female  Other Topics Concern   Not on file  Social History Narrative   Not on file   Social Drivers of Health   Financial Resource Strain: Low Risk  (09/06/2023)   Overall Financial Resource Strain (CARDIA)    Difficulty of Paying Living Expenses: Not hard at all  Food Insecurity: No Food Insecurity (08/29/2023)   Hunger Vital Sign    Worried About Running Out of Food in the Last Year: Never true    Ran Out of Food in the Last Year: Never true  Transportation Needs: No Transportation Needs (09/06/2023)   PRAPARE - Administrator, Civil Service (Medical): No    Lack of Transportation (Non-Medical): No  Physical Activity: Inactive (08/10/2022)   Exercise Vital Sign    Days of Exercise per Week: 0 days    Minutes of Exercise per Session: 0 min  Stress: No Stress Concern Present (09/06/2023)   [employer] of Occupational Health - Occupational Stress Questionnaire    Feeling of Stress: Not at all  Social Connections: Moderately Integrated (09/06/2023)   Social Connection and Isolation Panel    Frequency of Communication with Friends and Family: More than three times a week    Frequency of Social Gatherings with Friends and Family: More than three times a week    Attends Religious Services: Never    Database administrator or Organizations: Yes    Attends Engineer, structural: More than 4 times per year    Marital Status: Married    Tobacco Counseling Counseling given: Not Answered   Clinical Intake:  Pre-visit preparation completed: Yes  Pain : No/denies pain Pain Score: 0-No pain     Diabetes: No  How often do you need to have someone help you when you read instructions, pamphlets, or other written materials from your doctor or pharmacy?: 1 -  Never  Interpreter Needed?:  No  Information entered by :: Mliss Graff LPN   Activities of Daily Living    09/06/2023   10:33 AM 08/29/2023    3:05 PM  In your present state of health, do you have any difficulty performing the following activities:  Hearing? 0 0  Vision? 0 0  Difficulty concentrating or making decisions? 0 0  Walking or climbing stairs? 1   Dressing or bathing? 0   Doing errands, shopping? 0 0  Preparing Food and eating ? N   Using the Toilet? N   In the past six months, have you accidently leaked urine? N   Do you have problems with loss of bowel control? N   Managing your Medications? N   Managing your Finances? N   Housekeeping or managing your Housekeeping? N     Patient Care Team: de [place], Quintin PARAS, MD as PCP - General (Family Medicine) Lonni Slain, MD as PCP - Cardiology (Cardiology) Kennyth Chew, MD as PCP - Electrophysiology (Cardiology) Waylan Almarie SAUNDERS, MD (Family Medicine)  Indicate any recent Medical Services you may have received from other than [facility] providers in the past year (date may be approximate).     Assessment:   This is a routine wellness examination for Mandell.  Hearing/Vision screen Hearing Screening - Comments:: Tried hearing aids Does not wear Vision Screening - Comments:: N. [facility] Up to date   Goals Addressed             This Visit's Progress    Patient Stated       Stay healthy       Depression Screen    09/06/2023   10:29 AM 08/10/2023    3:41 PM 06/08/2023    8:27 AM 02/21/2023    8:52 AM 02/08/2023    8:32 AM 11/30/2022    8:10 AM 08/30/2022    8:24 AM  PHQ 2/9 Scores  PHQ - 2 Score 0 0 0 0 0 0 0  PHQ- 9 Score 0 0 0 0   4    Fall Risk    09/06/2023   10:27 AM 08/10/2023    3:39 PM 06/08/2023    8:27 AM 02/21/2023    8:51 AM 02/08/2023    8:31 AM  Fall Risk   Falls in the past year? 0 1 0 0 1  Number falls in past yr: 0 0 0 0 1  Injury with Fall? 0 1 0 0 0  Risk for fall due to :  Impaired balance/gait History of fall(s);Impaired balance/gait No Fall Risks No Fall Risks History of fall(s);Impaired balance/gait;Impaired mobility  Follow up Falls evaluation completed;Education provided;Falls prevention discussed Falls evaluation completed Falls evaluation completed Falls evaluation completed Falls evaluation completed;Education provided;Falls prevention discussed    MEDICARE RISK AT HOME: Medicare Risk at Home Any stairs in or around the home?: Yes If so, are there any without handrails?: No Home free of loose throw rugs in walkways, pet beds, electrical cords, etc?: Yes Adequate lighting in your home to reduce risk of falls?: Yes Life alert?: No Use of a cane, walker or w/c?: Yes Grab bars in the bathroom?: Yes Shower chair or bench in shower?: No Elevated toilet seat or a handicapped toilet?: Yes  TIMED UP AND GO:  Was the test performed?  No    Cognitive Function:        09/06/2023   10:26 AM 08/10/2022    3:36 PM  6CIT Screen  What Year? 0 points 0 points  What month? 0 points 0 points  What time? 0 points 0 points  Count back from 20 2 points 0 points  Months in reverse 0 points 0 points  Repeat phrase 2 points 0 points  Total Score 4 points 0 points    Immunizations Immunization History  Administered Date(s) Administered   Fluad Quad(high Dose 65+) 11/13/2018, 11/19/2021, 11/21/2022   Influenza-Unspecified 11/13/2013, 12/14/2014, 01/13/2018   PFIZER(Purple Top)SARS-COV-2 Vaccination 03/26/2019, 04/15/2019, 11/21/2022   Pneumococcal Conjugate Pcv21, Polysaccharide Crm197 Conjugaf 04/06/2023   Pneumococcal Polysaccharide-23 09/28/2011   Respiratory Syncytial Virus Vaccine ,Recomb Aduvanted(Arexvy ) 11/19/2021   Td 08/13/2004   Tdap 09/28/2011, 07/29/2022   Zoster, Live 03/15/2010    TDAP status: Up to date  Flu Vaccine status: Up to date  Pneumococcal vaccine status: Up to date  Covid-19 vaccine status: Information provided on how to  obtain vaccines.   Qualifies for Shingles Vaccine? Yes   Zostavax completed No   Shingrix Completed?: No.    Education has been provided regarding the importance of this vaccine. Patient has been advised to call insurance company to determine out of pocket expense if they have not yet received this vaccine. Advised may also receive vaccine at local pharmacy or Health Dept. Verbalized acceptance and understanding.  Screening Tests Health Maintenance  Topic Date Due   Zoster Vaccines- Shingrix (1 of 2) 07/08/1964   COVID-19 Vaccine (4 - 2024-25 season) 09/22/2023 (Originally 01/16/2023)   INFLUENZA VACCINE  10/14/2023   Pneumococcal Vaccine: 50+ Years (2 of 2 - PCV) 04/05/2024   Medicare Annual Wellness (AWV)  09/05/2024   DTaP/Tdap/Td (4 - Td or Tdap) 07/28/2032   Hepatitis B Vaccines  Aged Out   HPV VACCINES  Aged Out   Meningococcal B Vaccine  Aged Out   Colonoscopy  Discontinued    Health Maintenance  Health Maintenance Due  Topic Date Due   Zoster Vaccines- Shingrix (1 of 2) 07/08/1964    Colorectal cancer screening: No longer required.   Lung Cancer Screening: (Low Dose CT Chest recommended if Age 68-80 years, 20 pack-year currently smoking OR have quit w/in 15years.) does not qualify.   Lung Cancer Screening Referral:   Additional Screening:  Hepatitis C Screening:     never done  Vision Screening: Recommended annual ophthalmology exams for early detection of glaucoma and other disorders of the eye. Is the patient up to date with their annual eye exam?  Yes  Who is the provider or what is the name of the office in which the patient attends annual eye exams? [facility] If pt is not established with a provider, would they like to be referred to a provider to establish care? No .   Dental Screening: Recommended annual dental exams for proper oral hygiene    Community Resource Referral / Chronic Care Management: CRR required this visit?  No   CCM required this visit?   No     Plan:     I have personally reviewed and noted the following in the patient's chart:   Medical and social history Use of alcohol , tobacco or illicit drugs  Current medications and supplements including opioid prescriptions. Patient is not currently taking opioid prescriptions. Functional ability and status Nutritional status Physical activity Advanced directives List of other physicians Hospitalizations, surgeries, and ER visits in previous 12 months Vitals Screenings to include cognitive, depression, and falls Referrals and appointments  In addition, I have reviewed and discussed with patient certain preventive protocols, quality metrics, and best practice recommendations. A written  personalized care plan for preventive services as well as general preventive health recommendations were provided to patient.     Mliss Graff, LPN   [id number]/75/[id number]   After Visit Summary: (MyChart) Due to this being a telephonic visit, the after visit summary with patients personalized plan was offered to patient via MyChart   Nurse Notes:

## 2023-09-19 ENCOUNTER — Other Ambulatory Visit (HOSPITAL_COMMUNITY)

## 2023-09-20 ENCOUNTER — Other Ambulatory Visit (HOSPITAL_COMMUNITY)

## 2023-09-21 ENCOUNTER — Other Ambulatory Visit (HOSPITAL_BASED_OUTPATIENT_CLINIC_OR_DEPARTMENT_OTHER): Payer: Self-pay

## 2023-09-26 ENCOUNTER — Encounter (HOSPITAL_BASED_OUTPATIENT_CLINIC_OR_DEPARTMENT_OTHER): Payer: Self-pay | Admitting: Family Medicine

## 2023-09-26 ENCOUNTER — Ambulatory Visit (INDEPENDENT_AMBULATORY_CARE_PROVIDER_SITE_OTHER): Admitting: Family Medicine

## 2023-09-26 ENCOUNTER — Ambulatory Visit (HOSPITAL_COMMUNITY)
Admission: RE | Admit: 2023-09-26 | Discharge: 2023-09-26 | Disposition: A | Discharge status: Home / Self Care | Source: Ambulatory Visit | Attending: Internal Medicine | Admitting: Internal Medicine

## 2023-09-26 VITALS — BP 102/56 | HR 77 | Ht 72.0 in | Wt 249.8 lb

## 2023-09-26 VITALS — BP 113/72 | HR 77 | Wt 249.7 lb

## 2023-09-26 DIAGNOSIS — D6869 Other thrombophilia: Secondary | ICD-10-CM

## 2023-09-26 DIAGNOSIS — I4891 Unspecified atrial fibrillation: Secondary | ICD-10-CM | POA: Diagnosis not present

## 2023-09-26 DIAGNOSIS — I480 Paroxysmal atrial fibrillation: Secondary | ICD-10-CM | POA: Diagnosis not present

## 2023-09-26 DIAGNOSIS — H6120 Impacted cerumen, unspecified ear: Secondary | ICD-10-CM | POA: Insufficient documentation

## 2023-09-26 DIAGNOSIS — H6123 Impacted cerumen, bilateral: Secondary | ICD-10-CM

## 2023-09-26 DIAGNOSIS — R319 Hematuria, unspecified: Secondary | ICD-10-CM | POA: Diagnosis not present

## 2023-09-26 NOTE — Assessment & Plan Note (Signed)
 Patient recently went to have hearing aids fitted, however he had notable cerumen and was advised to have this addressed prior to fitting for hearing aids.  He presents today for further evaluation of this. On exam, patient does have notable cerumen bilaterally.  No other skin changes or problems noted on your exam. Discussed options, patient elected to proceed with utilize today.  Patient tolerated well.  Did have improvement after bilateral ear lavage.

## 2023-09-26 NOTE — Assessment & Plan Note (Addendum)
 Noted on recent urine testing.  This was also observed in setting of acute UTI.  Related to this, we will recheck urine sample today to ensure this does not persist If hematuria does persist, we will complete additional urinary testing and likely arrange for evaluation with urology

## 2023-09-26 NOTE — Progress Notes (Signed)
    Procedures performed today:    None.  Independent interpretation of notes and tests performed by another provider:   None.  Brief History, Exam, Impression, and Recommendations:    BP 113/72 (BP Location: Right Arm, Patient Position: Sitting, Cuff Size: Normal)   Pulse 77   Wt 249 lb 11.2 oz (113.3 kg)   SpO2 96%   BMI 33.87 kg/m   Bilateral impacted cerumen Assessment & Plan: Patient recently went to have hearing aids fitted, however he had notable cerumen and was advised to have this addressed prior to fitting for hearing aids.  He presents today for further evaluation of this. On exam, patient does have notable cerumen bilaterally.  No other skin changes or problems noted on your exam. Discussed options, patient elected to proceed with utilize today.  Patient tolerated well.  Did have improvement after bilateral ear lavage.   Hematuria, unspecified type Assessment & Plan: Noted on recent urine testing.  This was also observed in setting of acute UTI.  Related to this, we will recheck urine sample today to ensure this does not persist If hematuria does persist, we will complete additional urinary testing and likely arrange for evaluation with urology  Orders: -     Urinalysis, Complete  Return if symptoms worsen or fail to improve.   ___________________________________________ Tirzah Fross de [place], MD, ABFM, CAQSM Primary Care and Sports Medicine [facility]

## 2023-09-26 NOTE — Patient Instructions (Signed)
  Medication Instructions:  Your physician recommends that you continue on your current medications as directed. Please refer to the Current Medication list given to you today. --If you need a refill on any your medications before your next appointment, please call your pharmacy first. If no refills are authorized on file call the office.-- Lab Work: Your physician has recommended that you have lab work today: today If you have labs (blood work) drawn today and your tests are completely normal, you will receive your results via MyChart message OR a phone call from our staff.  Please ensure you check your voicemail in the event that you authorized detailed messages to be left on a delegated number. If you have any lab test that is abnormal or we need to change your treatment, we will call you to review the results.   Follow-Up: Your next appointment:   Your physician recommends that you schedule a follow-up appointment in: as needed with Dr. de [place]  You will receive a text message or e-mail with a link to a survey about your care and experience with [place] today! We would greatly appreciate your feedback!   Thanks for letting us be apart of your health journey!!  Primary Care and Sports Medicine   Dr. Ceasar Mons [place]   We encourage you to activate your patient portal called "MyChart".  Sign up information is provided on this After Visit Summary.  MyChart is used to connect with patients for Virtual Visits (Telemedicine).  Patients are able to view lab/test results, encounter notes, upcoming appointments, etc.  Non-urgent messages can be sent to your provider as well. To learn more about what you can do with MyChart, please visit --  [email or web address].

## 2023-09-26 NOTE — Progress Notes (Signed)
 Primary Care Physician: de [place], Quintin PARAS, MD Primary Cardiologist: Shelda Bruckner, MD Electrophysiologist: Fonda Kitty, MD     Referring Physician: [facility] clinic alert     Jeff Johnson is a 78 y.o. male with a history of HLD, AAA, AI s/p AVR, symptomatic bradycardia, Mobitz II AV block s/p PPM, prostate cancer, chronic diastolic heart failure, HTN, BPH, and atrial fibrillation who presents for consultation in the [facility]. Device clinic alert on 3/31 for ongoing Afib with RVR since 3/30. Patient is on ASA 81 mg daily. He has a CHADS2VASC score of 4.  On evaluation today, he is currently in Afib. He feels possibly more SOB and tired than usual. He denies having any OSA due to frequent urination at night. He takes ASA 81 mg daily.    On follow up 07/05/23, he is currently in Afib. Patient notes via Apple watch his Afib has been paroxysmal since last office visit. He began Eliquis  5 mg BID in addition to ASA 81 mg daily. No missed doses of Eliquis .   On follow up 09/26/23, patient is currently in AV paced rhythm. S/p Afib ablation on 08/29/23 by Dr. Cindie. No episodes of Afib since ablation. No chest pain or SOB. Leg sites healed without issue. No missed doses of anticoagulant.  Today, he denies symptoms of orthopnea, PND, lower extremity edema, dizziness, presyncope, syncope, snoring, daytime somnolence, bleeding, or neurologic sequela. The patient is tolerating medications without difficulties and is otherwise without complaint today.    he has a BMI of Body mass index is 33.88 kg/m.SABRA Filed Weights   09/26/23 1042  Weight: 113.3 kg      Current Outpatient Medications  Medication Sig Dispense Refill   acetaminophen  (TYLENOL ) 650 MG CR tablet Take 1,300 mg by mouth 3 (three) times daily as needed for pain or fever. (Patient taking differently: Take 1,300 mg by mouth as needed for pain or fever.)     apixaban  (ELIQUIS ) 5 MG TABS tablet Take 1  tablet (5 mg total) by mouth 2 (two) times daily. 60 tablet 3   ascorbic acid (VITAMIN C) 500 MG tablet Take 500 mg by mouth in the morning.     ASPIRIN  81 PO Take 1 tablet by mouth in the morning.     benzonatate  (TESSALON ) 200 MG capsule Take 1 capsule (200 mg total) by mouth 3 (three) times daily as needed for cough. (Patient taking differently: Take 200 mg by mouth as needed for cough.) 45 capsule 0   Cholecalciferol (VITAMIN D  PO) Take 5,000 Units by mouth in the morning.     dapagliflozin  propanediol (FARXIGA ) 10 MG TABS tablet Take 1 tablet (10 mg total) by mouth daily before breakfast. 90 tablet 2   furosemide  (LASIX ) 20 MG tablet Take 1 tablet (20 mg total) by mouth daily. Take additional 20mg  as needed for weight gain of 2 lbs overnight of 5 lbs in one week. 135 tablet 1   losartan  (COZAAR ) 50 MG tablet Take 1 tablet (50 mg total) by mouth daily. 90 tablet 1   montelukast  (SINGULAIR ) 10 MG tablet Take 1 tablet (10 mg total) by mouth daily. 30 tablet 3   Multiple Vitamins-Minerals (PRESERVISION AREDS 2) CAPS Take 1 capsule by mouth in the morning and at bedtime.     pantoprazole  (PROTONIX ) 40 MG tablet Take 1 tablet (40 mg total) by mouth daily. (Pause lansoprazole ) 45 tablet 0   simvastatin  (ZOCOR ) 20 MG tablet Take 1 tablet (20 mg  total) by mouth daily. 30 tablet 3   tamsulosin  (FLOMAX ) 0.4 MG CAPS capsule Take 1 capsule (0.4 mg total) by mouth daily. 30 capsule 6   [Paused] lansoprazole  (PREVACID ) 15 MG capsule Take 1 capsule (15 mg total) by mouth daily at 12 noon. (Patient not taking: Reported on 09/26/2023) 30 capsule 3   zinc gluconate 50 MG tablet Take 50-75 mg by mouth in the morning. Patient takes 50/75 daily (Patient not taking: Reported on 09/26/2023)     No current facility-administered medications for this encounter.    Atrial Fibrillation Management history:  Previous antiarrhythmic drugs: none Previous cardioversions: none Previous ablations: 08/29/23 Anticoagulation  history: Eliquis    ROS- All systems are reviewed and negative except as per the HPI above.  Physical Exam: BP (!) 102/56   Pulse 77   Ht 6' (1.829 m)   Wt 113.3 kg   BMI 33.88 kg/m   GEN- The patient is well appearing, alert and oriented x 3 today.   Neck - no JVD or carotid bruit noted Lungs- Clear to ausculation bilaterally, normal work of breathing Heart- Regular rate and rhythm, no murmurs, rubs or gallops, PMI not laterally displaced Extremities- no clubbing, cyanosis, or edema Skin - no rash or ecchymosis noted    EKG today demonstrates  Vent. rate 77 BPM PR interval 208 ms QRS duration 126 ms QT/QTcB 424/479 ms P-R-T axes * -85 85 AV dual-paced rhythm with occasional ventricular-paced complexes and with premature ventricular or aberrantly conducted complexes Abnormal ECG When compared with ECG of 30-Aug-2023 06:11, PVCs are present  Echo 08/29/23 (TEE): 1. Left ventricular ejection fraction, by estimation, is 40 to 45%. The  left ventricle has mildly decreased function. The left ventricle  demonstrates regional wall motion abnormalities (see scoring  diagram/findings for description).   2. Right ventricular systolic function is mildly reduced. The right  ventricular size is normal.   3. Left atrial size was severely dilated. No left atrial/left atrial  appendage thrombus was detected.   4. A small pericardial effusion is present. The pericardial effusion is  anterior to the right ventricle and surrounding the apex. There is no  evidence of cardiac tamponade.   5. The mitral valve is normal in structure. Mild mitral valve  regurgitation.   6. The aortic valve has been repaired/replaced. Aortic valve  regurgitation is not visualized. Aortic valve sclerosis/calcification is  present, without any evidence of aortic stenosis. 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  Procedure Date: 11/2009. Echo findings are consistent with normal   structure and function of the aortic valve prosthesis. Aortic valve mean  gradient measures 10.0 mmHg. Aortic valve Vmax measures 1.95 m/s.   7. Aortic dilatation noted. There is mild dilatation of the aortic root,  measuring 41 mm. There is Moderate (Grade III) layered plaque involving  the descending aorta.   ASSESSMENT & PLAN CHA2DS2-VASc Score = 4  The patient's score is based upon: CHF History: 1 HTN History: 1 Diabetes History: 0 Stroke History: 0 Vascular Disease History: 0 Age Score: 2 Gender Score: 0       ASSESSMENT AND PLAN: Paroxysmal Atrial Fibrillation (ICD10:  I48.0) The patient's CHA2DS2-VASc score is 4, indicating a 4.8% annual risk of stroke.   S/p Afib ablation on 08/29/23 by Dr. Cindie.  He is currently in AV paced rhythm.  Secondary Hypercoagulable State (ICD10:  D68.69) The patient is at significant risk for stroke/thromboembolism based upon his CHA2DS2-VASc Score of 4.  Start Apixaban  (  Eliquis ).  Continue Eliquis  5 mg BID without interruption. He remains on ASA 81 mg per Cardiology due to history of AVR.     Follow up with EP as scheduled.    Terra Pac, PA-C  [facility] [address] [place], [place] [address] [phone]

## 2023-09-27 ENCOUNTER — Ambulatory Visit (HOSPITAL_BASED_OUTPATIENT_CLINIC_OR_DEPARTMENT_OTHER): Payer: Self-pay | Admitting: Family Medicine

## 2023-09-27 LAB — MICROSCOPIC EXAMINATION
Bacteria, UA: NONE SEEN
Casts: NONE SEEN /LPF
RBC, Urine: NONE SEEN /HPF (ref 0–2)

## 2023-09-27 LAB — URINALYSIS, COMPLETE
Bilirubin, UA: NEGATIVE
Ketones, UA: NEGATIVE
Leukocytes,UA: NEGATIVE
Nitrite, UA: NEGATIVE
Protein,UA: NEGATIVE
RBC, UA: NEGATIVE
Specific Gravity, UA: 1.018 (ref 1.005–1.030)
Urobilinogen, Ur: 0.2 mg/dL (ref 0.2–1.0)
pH, UA: 6 (ref 5.0–7.5)

## 2023-10-03 ENCOUNTER — Encounter (HOSPITAL_BASED_OUTPATIENT_CLINIC_OR_DEPARTMENT_OTHER): Payer: Self-pay

## 2023-10-04 ENCOUNTER — Ambulatory Visit (HOSPITAL_BASED_OUTPATIENT_CLINIC_OR_DEPARTMENT_OTHER): Admitting: Family

## 2023-10-04 ENCOUNTER — Encounter (HOSPITAL_BASED_OUTPATIENT_CLINIC_OR_DEPARTMENT_OTHER): Payer: Self-pay | Admitting: Family

## 2023-10-04 ENCOUNTER — Other Ambulatory Visit (HOSPITAL_BASED_OUTPATIENT_CLINIC_OR_DEPARTMENT_OTHER): Payer: Self-pay

## 2023-10-04 VITALS — BP 108/70 | HR 67 | Ht 72.0 in | Wt 251.0 lb

## 2023-10-04 DIAGNOSIS — I5022 Chronic systolic (congestive) heart failure: Secondary | ICD-10-CM | POA: Diagnosis not present

## 2023-10-04 DIAGNOSIS — I10 Essential (primary) hypertension: Secondary | ICD-10-CM | POA: Diagnosis not present

## 2023-10-04 DIAGNOSIS — D6859 Other primary thrombophilia: Secondary | ICD-10-CM | POA: Diagnosis not present

## 2023-10-04 DIAGNOSIS — I480 Paroxysmal atrial fibrillation: Secondary | ICD-10-CM

## 2023-10-04 DIAGNOSIS — E782 Mixed hyperlipidemia: Secondary | ICD-10-CM

## 2023-10-04 MED ORDER — SACUBITRIL-VALSARTAN 24-26 MG PO TABS: 1.0000 | ORAL_TABLET | Freq: Two times a day (BID) | ORAL | Status: DC

## 2023-10-04 MED ORDER — SACUBITRIL-VALSARTAN 24-26 MG PO TABS
1.0000 | ORAL_TABLET | Freq: Two times a day (BID) | ORAL | 3 refills | Status: DC
  Filled 2023-10-04: qty 60, 30d supply, fill #0
  Filled 2023-10-27: qty 60, 30d supply, fill #1
  Filled 2023-11-26: qty 60, 30d supply, fill #2
  Filled 2023-12-26: qty 60, 30d supply, fill #3

## 2023-10-04 NOTE — Patient Instructions (Addendum)
 Medication Instructions:   DISCONTINUE Losartan .  START Entresto  one (1) tablet by mouth ( 24-26 mg) twice daily.   *If you need a refill on your cardiac medications before your next appointment, please call your pharmacy*  Lab Work:  Your physician recommends that you return for lab work in 7- 10 days, no fasting. Paper work given to patient today.    If you have labs (blood work) drawn today and your tests are completely normal, you will receive your results only by: MyChart Message (if you have MyChart) OR A paper copy in the mail If you have any lab test that is abnormal or we need to change your treatment, we will call you to review the results.  Testing/Procedures:  None ordered.  Follow-Up: At [facility], you and your health needs are our priority.  As part of our continuing mission to provide you with exceptional heart care, our providers are all part of one team.  This team includes your primary Cardiologist (physician) and Advanced Practice Providers or APPs (Physician Assistants and Nurse Practitioners) who all work together to provide you with the care you need, when you need it.  Your next appointment:   3 month(s)  Provider:   Shelda Bruckner, MD    We recommend signing up for the patient portal called MyChart.  Sign up information is provided on this After Visit Summary.  MyChart is used to connect with patients for Virtual Visits (Telemedicine).  Patients are able to view lab/test results, encounter notes, upcoming appointments, etc.  Non-urgent messages can be sent to your provider as well.   To learn more about what you can do with MyChart, go to [email or web address].   Other Instructions     We will ask the device team to do an extra check of your pacemaker to assess if your abnormal rhythms are more atrial fibrillation versus PVCs (early beats in bottom chambers of the heart). We will reach out to Dr. Cindie for next steps based on the  findings of the report.   The Entresto  helps to strengthen your heart muscle function which helps you to hold onto less fluid.

## 2023-10-04 NOTE — Progress Notes (Unsigned)
 Cardiology Office Note:  .   Date:  10/04/2023  ID:  Jeff, Johnson 07/17/1945, MRN [id number] PCP: de Johnson, Jeff PARAS, MD  [facility] Providers Cardiologist:  Jeff Bruckner, MD Cardiology APP:  Jeff Reche RAMAN, NP  Electrophysiologist:  Jeff Kitty, MD    History of Present Illness: .   Jeff Johnson is a 78 y.o. male with hx of HTN, HLD, aortic valve disease s/p Bentall procedure, symptomatic bradycardia s/p PPM.  Previous patient of Dr. Alveta having since established with Dr. Bruckner.   Last seen 03/28/2023 for exertional dyspnea.  Lasix  changed to 20 mg daily as he was euvolemic.  Weight was down 10 to 15 pounds after twice daily dosing for a week.  However via subsequent phone calls it was increased to 20 mg twice daily as he had difficulty urinating and increased awake.  Visit 05/09/2023 Farxiga  10mg  daily added with repeat BMP/BNP in 1 week with stable kidney function and BNP improved from 146 to 110.   06/01/23 seen by Jeff Barrack, NP for device check with normal PPM function. One 6 second episode of AT recommended for monitoring.   He saw AFib clinic 06/14/23 with atrial fib with RVR since 3/30 per device clinic report. Symptomatic with SOB and fatigue. follow up to discuss cardioversion scheduled. Eliquis  initiated for OAC. He underwent atrial fibrillation ablation 08/29/23 with Dr. Cindie.  Presents today for follow up independently. 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  Notes over the last week his heart has been more fluttering which is associated with generalized fatigue. This felt a little bit different than prior palpitations. Afib burden by Apple Watch 35%. No recent increased caffeine or alcohol  intake. Drinks iced tea in the mroning then caffeine free beverages. Does not drink alcohol . He has presently required additional PRN Lasix  2-3 x per week for fluid retention noted by weight. BP at home  relatively hypotensive 90-110s/50-60s but no lightheadedness, dizziness.   Echo TEE 08/29/23 as part of ablation workup: LVEF 40-45%, severe LAW, RVSF mildly reduced, small pericardial effusion, mild MR, aortic valve prosthesis functioning well, mild dilation of 41mm.  ROS: Please see the history of present illness.    All other systems reviewed and are negative.   Studies Reviewed: .        Cardiac Studies & Procedures   ______________________________________________________________________________________________   STRESS TESTS  MYOCARDIAL PERFUSION IMAGING 10/20/2016  Interpretation Summary  Nuclear stress EF: 58%.  There was no ST segment deviation noted during stress.  The study is normal.  This is a low risk study.  The left ventricular ejection fraction is normal (55-65%).   ECHOCARDIOGRAM  ECHOCARDIOGRAM COMPLETE 02/22/2023  Narrative ECHOCARDIOGRAM REPORT    Patient Name:   Jeff Johnson Date of Exam: 02/22/2023 Medical Rec #:  [id number]     Height:       72.0 in Accession #:    [id number]    Weight:       249.8 lb Date of Birth:  [birth year]     BSA:          2.342 m Patient Age:    77 years      BP:           129/69 mmHg Patient Gender: M             HR:           55 bpm. Exam Location:  Inpatient  Procedure: 2D Echo, Cardiac Doppler, Color Doppler and Intracardiac Opacification Agent  Indications:    DOE R06.00  History:        Patient has prior history of Echocardiogram examinations, most recent 02/13/2021. Aortic Valve Disease; Risk Factors:Former Smoker, Hypertension and Dyslipidemia. Aortic Valve: 27 mm Magna bioprosthetic valve is present in the aortic position. Procedure Date: 2011.  Sonographer:    Jeff Johnson RVT RCS Referring Phys: [id number] Jeff Johnson   Sonographer Comments: Technically difficult study due to poor echo windows, suboptimal parasternal window, suboptimal apical window and suboptimal subcostal window. Image acquisition  challenging due to patient body habitus. IMPRESSIONS   1. Left ventricular ejection fraction, by estimation, is 55 to 60%. The left ventricle has normal function. The left ventricle has no regional wall motion abnormalities. There is mild concentric left ventricular hypertrophy. Left ventricular diastolic parameters are consistent with Grade II diastolic dysfunction (pseudonormalization). Elevated left atrial pressure. 2. Right ventricular systolic function is normal. The right ventricular size is normal. There is mildly elevated pulmonary artery systolic pressure. The estimated right ventricular systolic pressure is 35.9 mmHg. 3. Left atrial size was mildly dilated. 4. The mitral valve is normal in structure. No evidence of mitral valve regurgitation. No evidence of mitral stenosis. 5. The aortic valve has been repaired/replaced. There is mild calcification of the aortic valve. There is mild thickening of the aortic valve. Aortic valve regurgitation is not visualized. There is a 27 mm Magna bioprosthetic valve present in the aortic position. Procedure Date: 2011. Echo findings are consistent with normal structure and function of the aortic valve prosthesis. Aortic valve mean gradient measures 5.0 mmHg. Aortic valve Vmax measures 1.50 m/s. 6. Previous ascending aorta Bentall repair. Aortic dilatation noted. There is mild dilatation of the aortic root, measuring 40 mm. There is mild dilatation of the ascending aorta, measuring 41 mm.  Comparison(s): Prior images reviewed side by side. The left ventricular diastolic function is significantly worse.  FINDINGS Left Ventricle: Left ventricular ejection fraction, by estimation, is 55 to 60%. The left ventricle has normal function. The left ventricle has no regional wall motion abnormalities. Definity  contrast agent was given IV to delineate the left ventricular endocardial borders. The left ventricular internal cavity size was normal in size. There is  mild concentric left ventricular hypertrophy. Left ventricular diastolic parameters are consistent with Grade II diastolic dysfunction (pseudonormalization). Elevated left atrial pressure.  Right Ventricle: The right ventricular size is normal. Right vetricular wall thickness was not well visualized. Right ventricular systolic function is normal. There is mildly elevated pulmonary artery systolic pressure. The tricuspid regurgitant velocity is 2.78 m/s, and with an assumed right atrial pressure of 5 mmHg, the estimated right ventricular systolic pressure is 35.9 mmHg.  Left Atrium: Left atrial size was mildly dilated.  Right Atrium: Right atrial size was normal in size.  Pericardium: There is no evidence of pericardial effusion.  Mitral Valve: The mitral valve is normal in structure. No evidence of mitral valve regurgitation. No evidence of mitral valve stenosis.  Tricuspid Valve: The tricuspid valve is normal in structure. Tricuspid valve regurgitation is mild.  Aortic Valve: The aortic valve has been repaired/replaced. There is mild calcification of the aortic valve. There is mild thickening of the aortic valve. Aortic valve regurgitation is not visualized. Aortic valve mean gradient measures 5.0 mmHg. Aortic valve peak gradient measures 9.0 mmHg. There is a 27 mm Magna bioprosthetic valve present in the aortic position. Procedure Date: 2011. Echo findings are consistent with normal  structure and function of the aortic valve prosthesis.  Pulmonic Valve: The pulmonic valve was grossly normal. Pulmonic valve regurgitation is mild. No evidence of pulmonic stenosis.  Aorta: Previous ascending aorta Bentall repair. Aortic dilatation noted, the aortic root was not well visualized and the aortic root is normal in size and structure. There is mild dilatation of the aortic root, measuring 40 mm. There is mild dilatation of the ascending aorta, measuring 41 mm.  Venous: The inferior vena cava was not  well visualized.  IAS/Shunts: The interatrial septum was not well visualized.   LEFT VENTRICLE PLAX 2D LVIDd:         5.40 cm Diastology LVIDs:         4.60 cm LV e' medial:    6.53 cm/s LV PW:         1.40 cm LV E/e' medial:  15.5 LV IVS:        1.30 cm LV e' lateral:   7.00 cm/s LV E/e' lateral: 14.4   RIGHT VENTRICLE RV Basal diam:  4.00 cm  LEFT ATRIUM             Index        RIGHT ATRIUM           Index LA diam:        3.80 cm 1.62 cm/m   RA Area:     16.90 cm LA Vol (A2C):   71.7 ml 30.62 ml/m  RA Volume:   46.30 ml  19.77 ml/m LA Vol (A4C):   50.3 ml 21.48 ml/m LA Biplane Vol: 64.4 ml 27.50 ml/m AORTIC VALVE                   PULMONIC VALVE AV Vmax:           150.00 cm/s PV Vmax:          0.99 m/s AV Vmean:          99.900 cm/s PV Peak grad:     3.9 mmHg AV VTI:            0.336 m     PR End Diast Vel: 5.57 msec AV Peak Grad:      9.0 mmHg AV Mean Grad:      5.0 mmHg LVOT Vmax:         120.00 cm/s LVOT Vmean:        80.000 cm/s LVOT VTI:          0.266 m LVOT/AV VTI ratio: 0.79  AORTA Ao Root diam: 4.00 cm Ao Asc diam:  4.10 cm  MITRAL VALVE                TRICUSPID VALVE MV Area (PHT): 3.37 cm     TR Peak grad:   30.9 mmHg MV Decel Time: 225 msec     TR Vmax:        278.00 cm/s MV E velocity: 101.00 cm/s MV A velocity: 76.50 cm/s   SHUNTS MV E/A ratio:  1.32         Systemic VTI: 0.27 m  Jerel Croitoru MD Electronically signed by Jerel Balding MD Signature Date/Time: 02/22/2023/3:23:51 PM    Final   TEE  ECHO TEE 08/29/2023  Narrative TRANSESOPHOGEAL ECHO REPORT    Patient Name:   Attikus Bartoszek Date of Exam: 08/29/2023 Medical Rec #:  [id number]   Height:       72.0 in Accession #:    [id number]  Weight:  230.0 lb Date of Birth:  1945-03-28   BSA:          2.261 m Patient Age:    78 years    BP:           159/91 mmHg Patient Gender: M           HR:           88 bpm. Exam Location:  Inpatient  Procedure: Transesophageal Echo, Color  Doppler and Cardiac Doppler (Both Spectral and Color Flow Doppler were utilized during procedure).  Indications:     I48.0 Paroxysmal atrial fibrillation  History:         Patient has prior history of Echocardiogram examinations, most recent 02/22/2023. Pacemaker, Arrythmias:Atrial Fibrillation; Risk Factors:Dyslipidemia and Hypertension. Aortic Valve: bioprosthetic valve is present in the aortic position. Procedure Date: 11/2009.  Sonographer:     Damien Senior RDCS Referring Phys:  OLE ONEIDA HOLTS Diagnosing Phys: Toribio Fuel MD   Sonographer Comments: Cath TEE prior to ablation   PROCEDURE: After discussion of the risks and benefits of a TEE, an informed consent was obtained from the patient. The transesophogeal probe was passed without difficulty through the esophogus of the patient. Sedation performed by different physician. The patient was monitored while under deep sedation. The patient developed no complications during the procedure.  IMPRESSIONS   1. Left ventricular ejection fraction, by estimation, is 40 to 45%. The left ventricle has mildly decreased function. The left ventricle demonstrates regional wall motion abnormalities (see scoring diagram/findings for description). 2. Right ventricular systolic function is mildly reduced. The right ventricular size is normal. 3. Left atrial size was severely dilated. No left atrial/left atrial appendage thrombus was detected. 4. A small pericardial effusion is present. The pericardial effusion is anterior to the right ventricle and surrounding the apex. There is no evidence of cardiac tamponade. 5. The mitral valve is normal in structure. Mild mitral valve regurgitation. 6. The aortic valve has been repaired/replaced. Aortic valve regurgitation is not visualized. Aortic valve sclerosis/calcification is present, without any evidence of aortic stenosis. There is a bioprosthetic valve present in the aortic position. Procedure  Date: 11/2009. Echo findings are consistent with normal structure and function of the aortic valve prosthesis. Aortic valve mean gradient measures 10.0 mmHg. Aortic valve Vmax measures 1.95 m/s. 7. Aortic dilatation noted. There is mild dilatation of the aortic root, measuring 41 mm. There is Moderate (Grade III) layered plaque involving the descending aorta.  FINDINGS Left Ventricle: Left ventricular ejection fraction, by estimation, is 40 to 45%. The left ventricle has mildly decreased function. The left ventricle demonstrates regional wall motion abnormalities. The left ventricular internal cavity size was normal in size.   LV Wall Scoring: The inferior septum is hypokinetic.  Right Ventricle: The right ventricular size is normal. No increase in right ventricular wall thickness. Right ventricular systolic function is mildly reduced.  Left Atrium: Left atrial size was severely dilated. No left atrial/left atrial appendage thrombus was detected.  Right Atrium: Right atrial size was normal in size.  Pericardium: A small pericardial effusion is present. The pericardial effusion is anterior to the right ventricle and surrounding the apex. There is no evidence of cardiac tamponade.  Mitral Valve: The mitral valve is normal in structure. Mild mitral valve regurgitation.  Tricuspid Valve: The tricuspid valve is normal in structure. Tricuspid valve regurgitation is mild.  Aortic Valve: The aortic valve has been repaired/replaced. Aortic valve regurgitation is not visualized. Aortic valve sclerosis/calcification is present, without any  evidence of aortic stenosis. Aortic valve mean gradient measures 10.0 mmHg. Aortic valve peak gradient measures 15.2 mmHg. There is a bioprosthetic valve present in the aortic position. Procedure Date: 11/2009. Echo findings are consistent with normal structure and function of the aortic valve prosthesis.  Pulmonic Valve: The pulmonic valve was normal in structure.  Pulmonic valve regurgitation is trivial.  Aorta: Aortic dilatation noted. There is mild dilatation of the aortic root, measuring 41 mm. There is moderate (Grade III) layered plaque involving the descending aorta.  IAS/Shunts: The interatrial septum appears to be lipomatous. No atrial level shunt detected by color flow Doppler.  Additional Comments: Spectral Doppler performed.  AORTIC VALVE AV Vmax:           195.00 cm/s AV Vmean:          156.000 cm/s AV VTI:            0.360 m AV Peak Grad:      15.2 mmHg AV Mean Grad:      10.0 mmHg LVOT Vmax:         51.20 cm/s LVOT Vmean:        38.300 cm/s LVOT VTI:          0.094 m LVOT/AV VTI ratio: 0.26   SHUNTS Systemic VTI: 0.09 m  Toribio Fuel MD Electronically signed by Toribio Fuel MD Signature Date/Time: 08/29/2023/11:01:08 AM    Final        ______________________________________________________________________________________________         Risk Assessment/Calculations:             Physical Exam:   VS:  BP 108/70 (BP Location: Right Arm, Patient Position: Sitting, Cuff Size: Normal)   Pulse 67   Ht 6' (1.829 m)   Wt 251 lb (113.9 kg)   SpO2 99%   BMI 34.04 kg/m    Wt Readings from Last 3 Encounters:  10/04/23 251 lb (113.9 kg)  09/26/23 249 lb 12.8 oz (113.3 kg)  09/26/23 249 lb 11.2 oz (113.3 kg)    GEN: Well nourished, well developed in no acute distress NECK: No JVD; No carotid bruits CARDIAC: RRR, no murmurs, rubs, gallops RESPIRATORY:  Clear to auscultation without rales, wheezing or rhonchi  ABDOMEN: Soft, non-tender, non-distended EXTREMITIES:  No edema; No deformity   ASSESSMENT AND PLAN: .    DOE / Diastolic dysfunction / HFrEF / Aortic dilation - Echo 02/2023 LVEF 55-60%, gr2dd. Echo TEE 08/29/23 as part of ablation workup: LVEF 40-45%, severe LAW, RVSF mildly reduced, small pericardial effusion, mild MR, aortic valve prosthesis functioning well, mild dilation of 41mm. Suspect reduced  LVEF related to atrial fib. GDMT includes Farxiga  10mg  daily, Lasix  20mg  daily with additional 20mg  PRN for weight gain of 2 lbs overnight or 5 lbs in one week.  Stop Losartan  and initiate Entresto  24-26mg  BID. BMET/BNP in 7-10 days.   Low sodium diet, fluid restriction <2L, and daily weights encouraged. Educated to contact our office for weight gain of 2 lbs overnight or 5 lbs in one week.  Consider repeat imaging of aortic dilation 08/2024, can be coordinated at future  follow up. Continue optimal BP control. Avoid fluoroquinolones.   HTN - Now with propensity towards hypotension which is asymptomatic. Discussed to monitor BP at home at least 2 hours after medications and sitting for 5-10 minutes. Stop Losartan , start Entresto  as above.  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 TEE 08/29/23 with  normally functioning prosthesis. Normal function by TEE 08/2023. Continue SBE prophylaxis.  HLD - Continue Simvastatin  20mg  daily.   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 AFib frequency increased by Apple Watch to 35%. Will request additional device download and route to EP/AFib clinic for input. Continue Eliquis  5mg  BID, denies bleeding complications.        Dispo: follow up in 3 months  Signed, Reche GORMAN Finder, NP

## 2023-10-06 ENCOUNTER — Encounter (HOSPITAL_BASED_OUTPATIENT_CLINIC_OR_DEPARTMENT_OTHER): Payer: Self-pay | Admitting: *Deleted

## 2023-10-06 ENCOUNTER — Other Ambulatory Visit (HOSPITAL_COMMUNITY): Payer: Self-pay | Admitting: Internal Medicine

## 2023-10-06 ENCOUNTER — Other Ambulatory Visit (HOSPITAL_BASED_OUTPATIENT_CLINIC_OR_DEPARTMENT_OTHER): Payer: Self-pay

## 2023-10-06 MED ORDER — APIXABAN 5 MG PO TABS
5.0000 mg | ORAL_TABLET | Freq: Two times a day (BID) | ORAL | 11 refills | Status: AC
  Filled 2023-10-06: qty 60, 30d supply, fill #0
  Filled 2023-11-01 – 2023-11-05 (×2): qty 60, 30d supply, fill #1
  Filled 2023-12-02 – 2023-12-05 (×3): qty 60, 30d supply, fill #2
  Filled 2024-01-07 (×2): qty 60, 30d supply, fill #3
  Filled 2024-01-16 – 2024-02-03 (×2): qty 60, 30d supply, fill #4
  Filled 2024-03-07: qty 60, 30d supply, fill #5
  Filled 2024-03-30: qty 60, 30d supply, fill #6

## 2023-10-07 ENCOUNTER — Telehealth: Payer: Self-pay | Admitting: Family

## 2023-10-07 ENCOUNTER — Telehealth: Payer: Self-pay

## 2023-10-07 NOTE — Telephone Encounter (Signed)
-----   Message from Reche GORMAN Finder sent at 10/07/2023  8:42 AM EDT ----- Hi team,  Increased AFib frequency by Apple Watch jumped from 3% to 35%. Can we get additional device download? Would recommend review by Dr. Cindie or AFib clinic team (CCing as FYI)

## 2023-10-07 NOTE — Telephone Encounter (Signed)
 Gave pt recommendations. Pt stated understanding. Pt may wait to start the 40mg  for 2 days tomorrow since he will be out this afternoon.

## 2023-10-07 NOTE — Telephone Encounter (Signed)
 Patient is eating lunch and asks that we call him back around 245pm today to send a remote transmission.  Need to re-eval AF burden and forward to AF clinic.

## 2023-10-07 NOTE — Telephone Encounter (Signed)
 Pt called in to report his weight has gone up. He states out scales are different than his at home and ours is going to show 5lbs more than it is. He states his new med changes working. Please advise.  7/22: 251 lbs  7/25: 239 lbs

## 2023-10-07 NOTE — Telephone Encounter (Signed)
 Spoke with pt. Pt states his weight has increased with the start of Entresto . Stated his scale at home has had him at 234 the past 2 weeks but is now weighting in at 25. He takes dry weights in the mornings. He took a lasix  this morning but that is the first he has taken since starting Entresto . Stated he will be out until this afternoon. Advised I would send this over for Reche Finder, NP to review.

## 2023-10-07 NOTE — Telephone Encounter (Signed)
 Patient returned RN's call.

## 2023-10-07 NOTE — Telephone Encounter (Signed)
 Triage team -   Please call to inquire: How frequently he is requiring additional dosing of Lasix  If he has additional weights, difficult to trend with just 2 weights  Reche GORMAN Finder, NP

## 2023-10-07 NOTE — Telephone Encounter (Signed)
 Recommend continue Entresto  at present dose. Recommend Lasix  40mg  daily x 2 days. Then return to 20mg  daily with additional 20mg  PRN for weight gain 2 lbs overnight or 5 lbs in one week.   If he will please call us  Monday with updated weights.   Kieren Ricci S Izaias Krupka, NP

## 2023-10-07 NOTE — Telephone Encounter (Signed)
 Left message to return call

## 2023-10-10 ENCOUNTER — Ambulatory Visit (HOSPITAL_BASED_OUTPATIENT_CLINIC_OR_DEPARTMENT_OTHER): Admitting: Family Medicine

## 2023-10-10 NOTE — Telephone Encounter (Signed)
 Received transmission.  Presenting is AP/VP rhythm: 61bpm No recorded AF except for on 09/26/23 around time patient reports seeing the increase on his watch.   2 AT episodes both less than 8 seconds.  AT/AF burden is <1% since June 16th.  Normal device function.  Patient reports since starting on increased lasix  and with recent med changes he is doing better with no concerns at present.    Forwarding to AF clinic, Dr. Cindie and Reche Finder, NP as RICK.

## 2023-10-11 DIAGNOSIS — I480 Paroxysmal atrial fibrillation: Secondary | ICD-10-CM | POA: Diagnosis not present

## 2023-10-11 DIAGNOSIS — D6859 Other primary thrombophilia: Secondary | ICD-10-CM | POA: Diagnosis not present

## 2023-10-11 NOTE — Telephone Encounter (Signed)
 Thank you for doing that! Much appreciated. Glad he is feeling better. Would not advise any changes at this time from my point of view.  Jeff Overfelt S Joaopedro Eschbach, NP

## 2023-10-12 ENCOUNTER — Ambulatory Visit (HOSPITAL_BASED_OUTPATIENT_CLINIC_OR_DEPARTMENT_OTHER): Payer: Self-pay | Admitting: Family

## 2023-10-12 LAB — BASIC METABOLIC PANEL WITH GFR
BUN/Creatinine Ratio: 12 (ref 10–24)
BUN: 15 mg/dL (ref 8–27)
CO2: 20 mmol/L (ref 20–29)
Calcium: 9.3 mg/dL (ref 8.6–10.2)
Chloride: 102 mmol/L (ref 96–106)
Creatinine, Ser: 1.21 mg/dL (ref 0.76–1.27)
Glucose: 150 mg/dL — ABNORMAL HIGH (ref 70–99)
Potassium: 3.7 mmol/L (ref 3.5–5.2)
Sodium: 140 mmol/L (ref 134–144)
eGFR: 61 mL/min/1.73 (ref >59)

## 2023-10-12 LAB — BRAIN NATRIURETIC PEPTIDE: BNP: 138.7 pg/mL — ABNORMAL HIGH (ref 0.0–100.0)

## 2023-10-13 NOTE — Progress Notes (Signed)
 Remote pacemaker transmission.

## 2023-10-17 ENCOUNTER — Other Ambulatory Visit (HOSPITAL_BASED_OUTPATIENT_CLINIC_OR_DEPARTMENT_OTHER): Payer: Self-pay

## 2023-10-17 ENCOUNTER — Other Ambulatory Visit: Payer: Self-pay

## 2023-10-18 ENCOUNTER — Other Ambulatory Visit (HOSPITAL_BASED_OUTPATIENT_CLINIC_OR_DEPARTMENT_OTHER): Payer: Self-pay

## 2023-10-19 ENCOUNTER — Other Ambulatory Visit (HOSPITAL_BASED_OUTPATIENT_CLINIC_OR_DEPARTMENT_OTHER): Payer: Self-pay

## 2023-10-20 ENCOUNTER — Other Ambulatory Visit (HOSPITAL_BASED_OUTPATIENT_CLINIC_OR_DEPARTMENT_OTHER): Payer: Self-pay

## 2023-10-21 ENCOUNTER — Other Ambulatory Visit (HOSPITAL_BASED_OUTPATIENT_CLINIC_OR_DEPARTMENT_OTHER): Payer: Self-pay

## 2023-10-24 ENCOUNTER — Other Ambulatory Visit (HOSPITAL_BASED_OUTPATIENT_CLINIC_OR_DEPARTMENT_OTHER): Payer: Self-pay

## 2023-10-24 ENCOUNTER — Other Ambulatory Visit (HOSPITAL_BASED_OUTPATIENT_CLINIC_OR_DEPARTMENT_OTHER): Payer: Self-pay | Admitting: Family Medicine

## 2023-10-24 DIAGNOSIS — I71019 Dissection of thoracic aorta, unspecified: Secondary | ICD-10-CM

## 2023-10-24 MED ORDER — TAMSULOSIN HCL 0.4 MG PO CAPS
0.4000 mg | ORAL_CAPSULE | Freq: Every day | ORAL | 6 refills | Status: AC
  Filled 2023-10-24: qty 30, 30d supply, fill #0
  Filled 2023-11-16 – 2023-11-21 (×4): qty 30, 30d supply, fill #1
  Filled 2023-12-14 – 2023-12-20 (×2): qty 30, 30d supply, fill #2
  Filled 2024-01-18 – 2024-01-30 (×2): qty 30, 30d supply, fill #3
  Filled 2024-02-24: qty 30, 30d supply, fill #4
  Filled 2024-03-26: qty 30, 30d supply, fill #5

## 2023-10-27 ENCOUNTER — Other Ambulatory Visit (HOSPITAL_BASED_OUTPATIENT_CLINIC_OR_DEPARTMENT_OTHER): Payer: Self-pay

## 2023-11-01 ENCOUNTER — Other Ambulatory Visit (HOSPITAL_BASED_OUTPATIENT_CLINIC_OR_DEPARTMENT_OTHER): Payer: Self-pay

## 2023-11-09 ENCOUNTER — Other Ambulatory Visit (HOSPITAL_BASED_OUTPATIENT_CLINIC_OR_DEPARTMENT_OTHER): Payer: Self-pay

## 2023-11-12 ENCOUNTER — Other Ambulatory Visit (HOSPITAL_BASED_OUTPATIENT_CLINIC_OR_DEPARTMENT_OTHER): Payer: Self-pay

## 2023-11-14 ENCOUNTER — Other Ambulatory Visit (HOSPITAL_BASED_OUTPATIENT_CLINIC_OR_DEPARTMENT_OTHER): Payer: Self-pay | Admitting: Family Medicine

## 2023-11-14 DIAGNOSIS — I71019 Dissection of thoracic aorta, unspecified: Secondary | ICD-10-CM

## 2023-11-15 ENCOUNTER — Other Ambulatory Visit (HOSPITAL_BASED_OUTPATIENT_CLINIC_OR_DEPARTMENT_OTHER): Payer: Self-pay

## 2023-11-15 ENCOUNTER — Other Ambulatory Visit: Payer: Self-pay

## 2023-11-15 MED ORDER — MONTELUKAST SODIUM 10 MG PO TABS
10.0000 mg | ORAL_TABLET | Freq: Every day | ORAL | 3 refills | Status: DC
  Filled 2023-11-15 – 2023-11-21 (×5): qty 30, 30d supply, fill #0
  Filled 2023-12-14 – 2023-12-20 (×2): qty 30, 30d supply, fill #1
  Filled 2024-01-18 – 2024-01-30 (×3): qty 30, 30d supply, fill #2
  Filled 2024-02-24: qty 30, 30d supply, fill #3

## 2023-11-15 MED ORDER — SIMVASTATIN 20 MG PO TABS
20.0000 mg | ORAL_TABLET | Freq: Every day | ORAL | 3 refills | Status: DC
  Filled 2023-11-15: qty 30, 30d supply, fill #0
  Filled 2023-12-12: qty 30, 30d supply, fill #1
  Filled 2024-01-09: qty 30, 30d supply, fill #2
  Filled 2024-02-06: qty 30, 30d supply, fill #3

## 2023-11-16 ENCOUNTER — Other Ambulatory Visit (HOSPITAL_BASED_OUTPATIENT_CLINIC_OR_DEPARTMENT_OTHER): Payer: Self-pay

## 2023-11-17 ENCOUNTER — Other Ambulatory Visit (HOSPITAL_BASED_OUTPATIENT_CLINIC_OR_DEPARTMENT_OTHER): Payer: Self-pay

## 2023-11-21 ENCOUNTER — Other Ambulatory Visit: Payer: Self-pay

## 2023-11-21 ENCOUNTER — Other Ambulatory Visit (HOSPITAL_BASED_OUTPATIENT_CLINIC_OR_DEPARTMENT_OTHER): Payer: Self-pay | Admitting: Family Medicine

## 2023-11-21 ENCOUNTER — Other Ambulatory Visit (HOSPITAL_BASED_OUTPATIENT_CLINIC_OR_DEPARTMENT_OTHER): Payer: Self-pay

## 2023-11-21 DIAGNOSIS — I71019 Dissection of thoracic aorta, unspecified: Secondary | ICD-10-CM

## 2023-11-21 MED ORDER — LANSOPRAZOLE 15 MG PO CPDR
15.0000 mg | DELAYED_RELEASE_CAPSULE | Freq: Every day | ORAL | 3 refills | Status: DC
  Filled 2023-11-21: qty 30, 30d supply, fill #0
  Filled 2023-12-21: qty 30, 30d supply, fill #1
  Filled 2024-01-20 – 2024-01-30 (×2): qty 30, 30d supply, fill #2
  Filled 2024-02-24: qty 30, 30d supply, fill #3

## 2023-11-25 ENCOUNTER — Ambulatory Visit (INDEPENDENT_AMBULATORY_CARE_PROVIDER_SITE_OTHER): Payer: Medicare Other

## 2023-11-25 DIAGNOSIS — I480 Paroxysmal atrial fibrillation: Secondary | ICD-10-CM | POA: Diagnosis not present

## 2023-11-25 LAB — CUP PACEART REMOTE DEVICE CHECK
Battery Remaining Longevity: 109 mo
Battery Remaining Percentage: 94 %
Battery Voltage: 3.01 V
Brady Statistic AP VP Percent: 60 %
Brady Statistic AP VS Percent: 1 %
Brady Statistic AS VP Percent: 38 %
Brady Statistic AS VS Percent: 1 %
Brady Statistic RA Percent Paced: 59 %
Brady Statistic RV Percent Paced: 98 %
Date Time Interrogation Session: 20250912020023
Implantable Lead Connection Status: 753985
Implantable Lead Connection Status: 753985
Implantable Lead Implant Date: 20241212
Implantable Lead Implant Date: 20241212
Implantable Lead Location: 753859
Implantable Lead Location: 753860
Implantable Pulse Generator Implant Date: 20241212
Lead Channel Impedance Value: 410 Ohm
Lead Channel Impedance Value: 450 Ohm
Lead Channel Pacing Threshold Amplitude: 0.75 V
Lead Channel Pacing Threshold Amplitude: 0.75 V
Lead Channel Pacing Threshold Pulse Width: 0.5 ms
Lead Channel Pacing Threshold Pulse Width: 0.5 ms
Lead Channel Sensing Intrinsic Amplitude: 12 mV
Lead Channel Sensing Intrinsic Amplitude: 3.7 mV
Lead Channel Setting Pacing Amplitude: 1 V
Lead Channel Setting Pacing Amplitude: 1.75 V
Lead Channel Setting Pacing Pulse Width: 0.5 ms
Lead Channel Setting Sensing Sensitivity: 2 mV
Pulse Gen Model: 2272
Pulse Gen Serial Number: 8224079

## 2023-11-26 ENCOUNTER — Other Ambulatory Visit: Payer: Self-pay

## 2023-11-26 ENCOUNTER — Emergency Department (HOSPITAL_COMMUNITY)
Admission: EM | Admit: 2023-11-26 | Discharge: 2023-11-26 | Disposition: A | Discharge status: Home / Self Care | Attending: Emergency Medicine | Admitting: Emergency Medicine

## 2023-11-26 ENCOUNTER — Other Ambulatory Visit (HOSPITAL_BASED_OUTPATIENT_CLINIC_OR_DEPARTMENT_OTHER): Payer: Self-pay

## 2023-11-26 ENCOUNTER — Ambulatory Visit (HOSPITAL_COMMUNITY)
Admission: RE | Admit: 2023-11-26 | Discharge: 2023-11-26 | Disposition: A | Discharge status: Home / Self Care | Source: Ambulatory Visit | Attending: Family Medicine | Admitting: Family Medicine

## 2023-11-26 ENCOUNTER — Emergency Department (HOSPITAL_COMMUNITY)

## 2023-11-26 ENCOUNTER — Encounter (HOSPITAL_COMMUNITY): Payer: Self-pay

## 2023-11-26 VITALS — BP 115/73 | HR 63 | Temp 98.1°F | Resp 20

## 2023-11-26 DIAGNOSIS — Z7982 Long term (current) use of aspirin: Secondary | ICD-10-CM | POA: Insufficient documentation

## 2023-11-26 DIAGNOSIS — J0130 Acute sphenoidal sinusitis, unspecified: Secondary | ICD-10-CM

## 2023-11-26 DIAGNOSIS — Z7901 Long term (current) use of anticoagulants: Secondary | ICD-10-CM | POA: Insufficient documentation

## 2023-11-26 DIAGNOSIS — I2510 Atherosclerotic heart disease of native coronary artery without angina pectoris: Secondary | ICD-10-CM | POA: Diagnosis not present

## 2023-11-26 DIAGNOSIS — I4891 Unspecified atrial fibrillation: Secondary | ICD-10-CM | POA: Insufficient documentation

## 2023-11-26 DIAGNOSIS — R519 Headache, unspecified: Secondary | ICD-10-CM

## 2023-11-26 LAB — CBC
HCT: 46 % (ref 39.0–52.0)
Hemoglobin: 15.2 g/dL (ref 13.0–17.0)
MCH: 29.2 pg (ref 26.0–34.0)
MCHC: 33 g/dL (ref 30.0–36.0)
MCV: 88.5 fL (ref 80.0–100.0)
Platelets: 146 K/uL — ABNORMAL LOW (ref 150–400)
RBC: 5.2 MIL/uL (ref 4.22–5.81)
RDW: 13.4 % (ref 11.5–15.5)
WBC: 6.3 K/uL (ref 4.0–10.5)
nRBC: 0 % (ref 0.0–0.2)

## 2023-11-26 LAB — RESP PANEL BY RT-PCR (RSV, FLU A&B, COVID)  RVPGX2
Influenza A by PCR: NEGATIVE
Influenza B by PCR: NEGATIVE
Resp Syncytial Virus by PCR: NEGATIVE
SARS Coronavirus 2 by RT PCR: NEGATIVE

## 2023-11-26 LAB — BASIC METABOLIC PANEL WITH GFR
Anion gap: 14 (ref 5–15)
BUN: 15 mg/dL (ref 8–23)
CO2: 22 mmol/L (ref 22–32)
Calcium: 8.7 mg/dL — ABNORMAL LOW (ref 8.9–10.3)
Chloride: 105 mmol/L (ref 98–111)
Creatinine, Ser: 1.06 mg/dL (ref 0.61–1.24)
GFR, Estimated: >60 mL/min (ref >60)
Glucose, Bld: 101 mg/dL — ABNORMAL HIGH (ref 70–99)
Potassium: 3.5 mmol/L (ref 3.5–5.1)
Sodium: 141 mmol/L (ref 135–145)

## 2023-11-26 MED ORDER — METOCLOPRAMIDE HCL 5 MG/ML IJ SOLN
10.0000 mg | Freq: Once | INTRAMUSCULAR | Status: AC
  Administered 2023-11-26: 10 mg via INTRAVENOUS
  Filled 2023-11-26: qty 2

## 2023-11-26 MED ORDER — AMOXICILLIN-POT CLAVULANATE 875-125 MG PO TABS
1.0000 | ORAL_TABLET | Freq: Once | ORAL | Status: AC
  Administered 2023-11-26: 1 via ORAL
  Filled 2023-11-26: qty 1

## 2023-11-26 MED ORDER — AMOXICILLIN-POT CLAVULANATE 875-125 MG PO TABS
1.0000 | ORAL_TABLET | Freq: Two times a day (BID) | ORAL | 0 refills | Status: DC
  Filled 2023-11-26: qty 14, 7d supply, fill #0

## 2023-11-26 MED ORDER — SODIUM CHLORIDE 0.9 % IV BOLUS
1000.0000 mL | Freq: Once | INTRAVENOUS | Status: AC
  Administered 2023-11-26: 1000 mL via INTRAVENOUS

## 2023-11-26 MED ORDER — DEXAMETHASONE SODIUM PHOSPHATE 10 MG/ML IJ SOLN
6.0000 mg | Freq: Once | INTRAMUSCULAR | Status: AC
  Administered 2023-11-26: 6 mg via INTRAVENOUS
  Filled 2023-11-26: qty 1

## 2023-11-26 NOTE — ED Triage Notes (Signed)
 PT reports he has had a HA since Tuesday. Pt reports his head hurts worse at nigh t. Pt has been taking Ibuprofen but not today.

## 2023-11-26 NOTE — ED Notes (Signed)
 Patient is being discharged from the Urgent Care and sent to the Emergency Department via private vehicle . Per provider, patient is in need of higher level of care due to headache since Tuesday. Patient is aware and verbalizes understanding of plan of care.  Vitals:   11/26/23 1212  BP: 115/73  Pulse: 63  Resp: 20  Temp: 98.1 F (36.7 C)  SpO2: 96%

## 2023-11-26 NOTE — ED Provider Notes (Signed)
 [place] EMERGENCY DEPARTMENT AT [facility] Provider Note   CSN: [id number] Arrival date & time: 11/26/23  1312     Patient presents with: Headache   Jeff Johnson is a 78 y.o. male with a history of A-fib status post ablation with EP in June 2025, presented to ED with a headache.  Patient reports he has had headache mostly in the left side of his head ongoing for 5 days.  Patient reports it was gradual onset.  It is predominantly on the left side of his head and temple, sometimes the left neck.  It waxes and wanes but never goes away.  He denies any visual changes.  Denies any hearing changes.  Denies any history of recent trauma or neck injury.  He reports he did have migraines in the past but they used to feel different, with a visual aura.  He has never had a headache lasting this long.  He went to urgent care today and was sent to the ED for further evaluation.  He is on Eliquis  for A-fib.  Anaphylactic allergies to iodine     HPI     Prior to Admission medications   Medication Sig Start Date End Date Taking? Authorizing Provider  acetaminophen  (TYLENOL ) 650 MG CR tablet Take 1,300 mg by mouth 3 (three) times daily as needed for pain or fever.    [provider]  apixaban  (ELIQUIS ) 5 MG TABS tablet Take 1 tablet (5 mg total) by mouth 2 (two) times daily. 10/06/23   Terra Fairy PARAS, PA-C  ascorbic acid (VITAMIN C) 500 MG tablet Take 500 mg by mouth in the morning.    [provider]  ASPIRIN  81 PO Take 1 tablet by mouth in the morning.    [provider]  benzonatate  (TESSALON ) 200 MG capsule Take 1 capsule (200 mg total) by mouth 3 (three) times daily as needed for cough. 04/19/23   de [place], Raymond J, MD  Cholecalciferol (VITAMIN D  PO) Take 5,000 Units by mouth in the morning.    [provider]  dapagliflozin  propanediol (FARXIGA ) 10 MG TABS tablet Take 1 tablet (10 mg total) by mouth daily before breakfast. 06/27/23   Walker,  Caitlin S, NP  furosemide  (LASIX ) 20 MG tablet Take 1 tablet (20 mg total) by mouth daily. Take additional 20mg  as needed for weight gain of 2 lbs overnight of 5 lbs in one week. 06/27/23 12/24/23  Walker, Caitlin S, NP  lansoprazole  (PREVACID ) 15 MG capsule Take 1 capsule (15 mg total) by mouth daily at 12 noon. 11/21/23   de [place], Raymond J, MD  montelukast  (SINGULAIR ) 10 MG tablet Take 1 tablet (10 mg total) by mouth daily. 11/15/23   de [place], Raymond J, MD  Multiple Vitamins-Minerals (PRESERVISION AREDS 2) CAPS Take 1 capsule by mouth in the morning and at bedtime.    [provider]  pantoprazole  (PROTONIX ) 40 MG tablet Take 1 tablet (40 mg total) by mouth daily. (Pause lansoprazole ) 08/30/23 10/14/23  Lesia Ozell Barter, PA-C  sacubitril -valsartan  (ENTRESTO ) 24-26 MG Take 1 tablet by mouth 2 (two) times daily. 10/04/23   Vannie Reche RAMAN, NP  sacubitril -valsartan  (ENTRESTO ) 24-26 MG Take 1 tablet by mouth 2 (two) times daily. 10/04/23   Vannie Reche RAMAN, NP  simvastatin  (ZOCOR ) 20 MG tablet Take 1 tablet (20 mg total) by mouth daily. 11/15/23   de [place], Raymond J, MD  tamsulosin  (FLOMAX ) 0.4 MG CAPS capsule Take 1 capsule (0.4 mg total) by mouth  daily. 10/24/23   de [place], Quintin PARAS, MD  zinc gluconate 50 MG tablet Take 50-75 mg by mouth in the morning. Patient takes 50/75 daily    [provider]    Allergies: Beta adrenergic blockers, Iodinated contrast media, Shellfish allergy, and Shellfish-derived products    Review of Systems  Updated Vital Signs BP 110/61 (BP Location: Right Arm)   Pulse 72   Temp 98.4 F (36.9 C)   Resp 18   Ht 6' (1.829 m)   Wt 107.5 kg   SpO2 98%   BMI 32.14 kg/m   Physical Exam Constitutional:      General: He is not in acute distress. HENT:     Head: Normocephalic and atraumatic.     Comments: No temporal tenderness, no sinus tenderness Eyes:     Conjunctiva/sclera: Conjunctivae normal.     Pupils: Pupils are equal, round, and  reactive to light.  Neck:     Comments: No neck pain or stiffness, no cervical midline tenderness or vertebral paracervical tenderness Cardiovascular:     Rate and Rhythm: Normal rate and regular rhythm.  Pulmonary:     Effort: Pulmonary effort is normal. No respiratory distress.  Abdominal:     General: There is no distension.     Tenderness: There is no abdominal tenderness.  Skin:    General: Skin is warm and dry.  Neurological:     General: No focal deficit present.     Mental Status: He is alert. Mental status is at baseline.  Psychiatric:        Mood and Affect: Mood normal.        Behavior: Behavior normal.     (all labs ordered are listed, but only abnormal results are displayed) Labs Reviewed  BASIC METABOLIC PANEL WITH GFR - Abnormal; Notable for the following components:      Result Value   Glucose, Bld 101 (*)    Calcium 8.7 (*)    All other components within normal limits  CBC - Abnormal; Notable for the following components:   Platelets 146 (*)    All other components within normal limits  RESP PANEL BY RT-PCR (RSV, FLU A&B, COVID)  [id number]    EKG: None  Radiology: CUP PACEART REMOTE DEVICE CHECK Result Date: 11/25/2023 PPM Scheduled remote reviewed. Normal device function.  Presenting rhythm:  AS/VP 24 AMS EGM's c/w AF, longest duration 4hrs , overall controlled rates, Eliquis  per PA report Next remote 91 days. LA, CVRS    Procedures   Medications Ordered in the ED  sodium chloride  0.9 % bolus 1,000 mL (1,000 mLs Intravenous New Bag/Given 11/26/23 1452)  metoCLOPramide  (REGLAN ) injection 10 mg (10 mg Intravenous Given 11/26/23 1454)  dexamethasone  (DECADRON ) injection 6 mg (6 mg Intravenous Given 11/26/23 1454)    Clinical Course as of 11/26/23 1522  Sat Nov 26, 2023  1522 Patient is signed out to Dr Jhonny EDP pending CT imaging and reassessment of symptoms. [MT]    Clinical Course User Index [MT] Penn Grissett, Donnice PARAS, MD                                  Medical Decision Making Amount and/or Complexity of Data Reviewed Labs: ordered. Radiology: ordered.  Risk Prescription drug management.   This patient presents to the Emergency Department with complaint of headache.  This involves an extensive number of treatment options, and is a complaint that carries  with it a high risk of complications and morbidity.  The differential diagnosis for headache includes tension type headache vs occipital headache vs migraine vs sinusitis vs intracranial bleed vs other  Less likely subarachnoid hemorrhage.  There is no thunderclap onset.  Less likely meningitis.  No fever or infectious symptoms reported, with symptoms ongoing for 5 days.  No photophobia or nuchal rigidity.  Less likely giant cell arteritis, no temporal tenderness.  No visual changes.  I ordered, reviewed, and interpreted labs, including BMP and CBC.  There were no immediate, life-threatening emergencies found in this labwork.   I ordered medication IV steroids, IV Reglan  and fluids for headache and/or nausea I ordered imaging studies which included CT scan of the head, which was pending at signout      Final diagnoses:  None    ED Discharge Orders     None          Cottie Donnice PARAS, MD 11/26/23 1523

## 2023-11-26 NOTE — Discharge Instructions (Addendum)
 It was our pleasure to provide your ER care today - we hope that you feel better.  Drink plenty of fluids/stay well hydrated. Take acetaminophen  as need. .  Your imaging tests show no acute intracranial process - note is made of possible sphenoid sinusitis - take augmentin  (antibiotic) as prescribed.    Follow up closely with primary care doctor in the next 1-2 weeks.   Return to ER if worse, new symptoms, fevers, worsening/severe pain, persistent vomiting, numbness/weakness, change in speech or vision, or other concern.

## 2023-11-26 NOTE — ED Notes (Signed)
 Patient transported to CT

## 2023-11-26 NOTE — ED Provider Notes (Addendum)
 Signed out that ct head pending - if neg for acute process to d/c to home.   Ct head neg acute intracranial process. Note is made of mild appearing left sphenoid sinusitis - pt has noted recent sinus congestion - discussed findings and options - pt agreeable/wants abx tx.  Augmentin  po and rx provided.   Pt currently appears stable for ED d/c per Dr Axel plan.      Bernard Drivers, MD 11/26/23 [phone]

## 2023-11-26 NOTE — Discharge Instructions (Signed)
 Will go to the emergency room for further evaluation

## 2023-11-26 NOTE — ED Provider Notes (Signed)
 [facility]    CSN: [id number] Arrival date & time: 11/26/23  1136      History   Chief Complaint Chief Complaint  Patient presents with   Headache    HPI Jeff Johnson is a 78 y.o. male.    Headache  Here for headache that is on the left side of his head.  It has been going on since September night.  It ranges from a 4 out of 10 to an 8 out of 10.  He feels like it worsens when he lies down on the left side and he feels like something is moving around in his head.  No fever or chills.  He has had a little congestion today but no cough. No vomiting or diarrhea  He is allergic to beta-blockers and iodine.  He does take Eliquis  and has a history of A-fib.  No recent falls or head injuries.   Past Medical History:  Diagnosis Date   Arthritis    Benign essential HTN 05/21/2014   BPH (benign prostatic hyperplasia)    Chronic cystitis    a. With ongoing hematuria.   Contrast media allergy    Diverticulitis    a. 2010: diverticulitis with stricture Sigmoid colectomy with mobilization of the splenic flexure within the end anastomosis.    Dizziness and giddiness    a. H/o dizziness with reportedly negative tilt table. Holter 2012: NSR, sinus tachy, frequent PVCs, multifocal at times in a trigeminal fashion.   Dyspnea on exertion 03/18/2014   Frequency of urination    GERD (gastroesophageal reflux disease)    H/O cardiac catheterization    a. 2011: minor luminal irregularities. b. Nuc 03/2014: ow risk without reversible ischemia or infarction, EF 57%. (There is septal akinesis but otherwise normal wall motion. This is nonspecific.)   Headache    MIGRAINES   Hematuria    History of urinary retention    Hyperlipidemia 03/18/2014   Hyponatremia    a. Remote hx hyponatremia felt 2/2 SSRI.   Nocturia    Obesity    Orthostatic hypotension    Prostate cancer (HCC) 2011   a. s/p radiation.   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 11/2009: pericardial AVR/Bentall.   SYNCOPE 02/18/2010   Qualifier: Diagnosis of  By: Waddell, MD, CODY Danelle Fallow    Thoracic aortic aneurysm [facility])    a. 11/2009: s/p Magna Ease pericardial tissue valve size 27mm and replacement of fusiform ascending thoracic aneurysm with 30-mm Hemashield cath with hemiarch reconstruction.    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   Dysuria 03/04/2022   UTI (urinary tract infection) 02/22/2022   Elevated serum creatinine 02/22/2022   Fatigue 02/09/2022   Urinary frequency 02/09/2022   Medicare annual wellness visit, subsequent 08/07/2021   Bilateral impacted cerumen 11/13/2020   Sensorineural hearing loss (SNHL), 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   Positive colorectal cancer screening using Cologuard test    Benign  neoplasm of ascending colon    Benign neoplasm of transverse colon    Pain of left hip joint 04/22/2017   History of revision of total replacement of left hip joint 04/09/2017   History of left hip replacement 04/08/2017   Osteoarthritis 04/06/2017   Arthritis of right hand 02/07/2017   Pain in right hand 02/07/2017   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   Essential hypertension 05/21/2014   Orthostatic hypotension    BPH (benign prostatic hyperplasia)    Diverticulitis    Hyponatremia    H/O cardiac catheterization    Hematuria    Severe aortic stenosis s/p pericardial AVR, Bentall in 2011    Contrast media  allergy    Obesity    Prostate cancer (HCC)    Chronic cystitis    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   Shortness of breath 03/18/2014   Hyperlipidemia 03/18/2014   GERD (gastroesophageal reflux disease) 03/18/2014   Aneurysm of aorta (HCC) 07/03/2012   History of prostate cancer 07/03/2012   Shoulder pain 05/02/2012   Hematest positive stools 02/03/2012   History of hypertension 05/18/2010   SYNCOPE 02/18/2010   ORTHOSTATIC DIZZINESS 02/18/2010   Allergic rhinitis 02/16/2010   Depression 01/30/2010   History of anemia 01/30/2010   Cardiomegaly 10/15/2009   Vitamin D  deficiency 09/03/2009   Hammer toe 12/16/2008   Osteoarthritis 12/03/2008   Hearing loss 06/20/2008    Past Surgical History:  Procedure Laterality Date   ATRIAL FIBRILLATION ABLATION N/A 08/29/2023   Procedure: ATRIAL FIBRILLATION ABLATION;  Surgeon: Cindie Ole DASEN, MD;  Location: [facility];  Service: Cardiovascular;  Laterality: N/A;   BACK SURGERY  1985   CARPAL TUNNEL RELEASE     BIL WRISTS   CHOLECYSTECTOMY  2010   COLON SURGERY  2010   colon resection   COLONOSCOPY  2008   [facility]   COLONOSCOPY WITH PROPOFOL  N/A 09/02/2017   Procedure: COLONOSCOPY WITH PROPOFOL ;  Surgeon: Legrand Victory LITTIE DOUGLAS, MD;  Location: [facility];  Service: Gastroenterology;  Laterality: N/A;   doppler carotid  2012   DOPPLER ECHOCARDIOGRAPHY  2011   PACEMAKER IMPLANT N/A 02/24/2023   Procedure: PACEMAKER IMPLANT;  Surgeon: Kennyth Chew, MD;  Location: [facility] INVASIVE CV LAB;  Service: Cardiovascular;  Laterality: N/A;   POLYPECTOMY  09/02/2017   Procedure: POLYPECTOMY;  Surgeon: Legrand Victory LITTIE DOUGLAS, MD;  Location: [facility];  Service: Gastroenterology;;   PROSTATE SURGERY  2011   adenocarcinoma w/raddiation    ROTATOR CUFF REPAIR     X2 LEFT SHOULDER   THORACIC AORTIC ANEURYSM REPAIR  12/11/09   ASCENDING THORACIC AORTIC ANEURYSM REPAIR   TISSUE AORTIC VALVE REPLACEMENT  12/11/09   TOTAL HIP ARTHROPLASTY  Left 04/17/2015   Procedure: LEFT TOTAL HIP ARTHROPLASTY ANTERIOR APPROACH;  Surgeon: Redell Shoals, MD;  Location: [facility];  Service: Orthopedics;  Laterality: Left;   TRANSESOPHAGEAL ECHOCARDIOGRAM (CATH LAB) N/A 08/29/2023   Procedure: TRANSESOPHAGEAL ECHOCARDIOGRAM;  Surgeon: Cindie Ole DASEN, MD;  Location: [facility] INVASIVE CV LAB;  Service: Cardiovascular;  Laterality: N/A;       Home Medications    Prior to Admission medications   Medication Sig Start Date End Date Taking? Authorizing Provider  acetaminophen  (TYLENOL ) 650 MG CR tablet Take 1,300 mg by mouth 3 (three) times daily as needed for pain or fever.   Yes [provider]  apixaban  (ELIQUIS ) 5 MG TABS tablet Take 1 tablet (5 mg total) by mouth 2 (two) times daily. 10/06/23  Yes  Terra Fairy PARAS, PA-C  ascorbic acid (VITAMIN C) 500 MG tablet Take 500 mg by mouth in the morning.   Yes [provider]  ASPIRIN  81 PO Take 1 tablet by mouth in the morning.   Yes [provider]  benzonatate  (TESSALON ) 200 MG capsule Take 1 capsule (200 mg total) by mouth 3 (three) times daily as needed for cough. 04/19/23  Yes de Peru, Raymond J, MD  Cholecalciferol (VITAMIN D  PO) Take 5,000 Units by mouth in the morning.   Yes [provider]  dapagliflozin  propanediol (FARXIGA ) 10 MG TABS tablet Take 1 tablet (10 mg total) by mouth daily before breakfast. 06/27/23  Yes Walker, Caitlin S, NP  furosemide  (LASIX ) 20 MG tablet Take 1 tablet (20 mg total) by mouth daily. Take additional 20mg  as needed for weight gain of 2 lbs overnight of 5 lbs in one week. 06/27/23 12/24/23 Yes Vannie Reche RAMAN, NP  lansoprazole  (PREVACID ) 15 MG capsule Take 1 capsule (15 mg total) by mouth daily at 12 noon. 11/21/23  Yes de [place], Raymond J, MD  montelukast  (SINGULAIR ) 10 MG tablet Take 1 tablet (10 mg total) by mouth daily. 11/15/23  Yes de [place], Raymond J, MD  Multiple Vitamins-Minerals (PRESERVISION AREDS 2) CAPS Take 1 capsule by mouth in the  morning and at bedtime.   Yes [provider]  sacubitril -valsartan  (ENTRESTO ) 24-26 MG Take 1 tablet by mouth 2 (two) times daily. 10/04/23  Yes Vannie Reche RAMAN, NP  sacubitril -valsartan  (ENTRESTO ) 24-26 MG Take 1 tablet by mouth 2 (two) times daily. 10/04/23  Yes Vannie Reche RAMAN, NP  simvastatin  (ZOCOR ) 20 MG tablet Take 1 tablet (20 mg total) by mouth daily. 11/15/23  Yes de [place], Raymond J, MD  tamsulosin  (FLOMAX ) 0.4 MG CAPS capsule Take 1 capsule (0.4 mg total) by mouth daily. 10/24/23  Yes de [place], Raymond J, MD  zinc gluconate 50 MG tablet Take 50-75 mg by mouth in the morning. Patient takes 50/75 daily   Yes [provider]  pantoprazole  (PROTONIX ) 40 MG tablet Take 1 tablet (40 mg total) by mouth daily. (Pause lansoprazole ) 08/30/23 10/14/23  Lesia Ozell Barter, PA-C    Family History Family History  Problem Relation Age of Onset   Stroke Mother 19   Heart disease Father 26   Heart attack Father    Colon cancer Maternal Aunt    Stomach cancer Neg Hx    Esophageal cancer Neg Hx     Social History Social History   Tobacco Use   Smoking status: Former    Current packs/day: 0.00    Types: Cigarettes    Quit date: 03/16/1967    Years since quitting: 56.7    Passive exposure: Past   Smokeless tobacco: Never  Vaping Use   Vaping status: Never Used  Substance Use Topics   Alcohol  use: No   Drug use: No     Allergies   Beta adrenergic blockers, Iodinated contrast media, Shellfish allergy, and Shellfish-derived products   Review of Systems Review of Systems  Neurological:  Positive for headaches.     Physical Exam Triage Vital Signs ED Triage Vitals  Encounter Vitals Group     BP 11/26/23 1212 115/73     Girls Systolic BP Percentile --      Girls Diastolic BP Percentile --      Boys Systolic BP Percentile --      Boys Diastolic BP Percentile --      Pulse Rate 11/26/23 1212 63  Resp 11/26/23 1212 20     Temp 11/26/23 1212 98.1 F (36.7  C)     Temp src --      SpO2 11/26/23 1212 96 %     Weight --      Height --      Head Circumference --      Peak Flow --      Pain Score 11/26/23 1209 10     Pain Loc --      Pain Education --      Exclude from Growth Chart --    No data found.  Updated Vital Signs BP 115/73   Pulse 63   Temp 98.1 F (36.7 C)   Resp 20   SpO2 96%   Visual Acuity Right Eye Distance:   Left Eye Distance:   Bilateral Distance:    Right Eye Near:   Left Eye Near:    Bilateral Near:     Physical Exam Vitals (Blood pressure is 115/73 here; heart rate was normal) reviewed.  HENT:     Head: Normocephalic.     Right Ear: Tympanic membrane and ear canal normal.     Left Ear: Tympanic membrane and ear canal normal.     Nose: Nose normal.     Mouth/Throat:     Mouth: Mucous membranes are moist.     Pharynx: No oropharyngeal exudate or posterior oropharyngeal erythema.  Eyes:     Extraocular Movements: Extraocular movements intact.     Conjunctiva/sclera: Conjunctivae normal.     Pupils: Pupils are equal, round, and reactive to light.  Cardiovascular:     Rate and Rhythm: Normal rate and regular rhythm.     Heart sounds: No murmur heard. Pulmonary:     Effort: Pulmonary effort is normal.     Breath sounds: Normal breath sounds.  Musculoskeletal:     Cervical back: Neck supple.  Lymphadenopathy:     Cervical: No cervical adenopathy.  Skin:    Coloration: Skin is not jaundiced or pale.  Neurological:     General: No focal deficit present.     Mental Status: He is oriented to person, place, and time.  Psychiatric:        Behavior: Behavior normal.      UC Treatments / Results  Labs (all labs ordered are listed, but only abnormal results are displayed) Labs Reviewed - No data to display  EKG   Radiology CUP PACEART REMOTE DEVICE CHECK Result Date: 11/25/2023 PPM Scheduled remote reviewed. Normal device function.  Presenting rhythm:  AS/VP 24 AMS EGM's c/w AF, longest  duration 4hrs , overall controlled rates, Eliquis  per PA report Next remote 91 days. LA, CVRS   Procedures Procedures (including critical care time)  Medications Ordered in UC Medications - No data to display  Initial Impression / Assessment and Plan / UC Course  I have reviewed the triage vital signs and the nursing notes.  Pertinent labs & imaging results that were available during my care of the patient were reviewed by me and considered in my medical decision making (see chart for details).     I have asked him to proceed to the emergency room for further evaluation that we cannot provide in a clinic setting.  He is agreeable and will go by private car.  Final Clinical Impressions(s) / UC Diagnoses   Final diagnoses:  Acute intractable headache, unspecified headache type     Discharge Instructions      Will go to the  emergency room for further evaluation     ED Prescriptions   None    I have reviewed the PDMP during this encounter.   Vonna Sharlet POUR, MD 11/26/23 1255

## 2023-11-26 NOTE — ED Triage Notes (Signed)
 Pt came in via POV d/t a HA that he has had since last Tuesday that he feels more on the Lt side, no OTC home meds have helped with the pain. A/Ox4, rates his pain 8/10 during triage.

## 2023-11-27 ENCOUNTER — Ambulatory Visit: Payer: Self-pay | Admitting: Cardiology

## 2023-11-27 ENCOUNTER — Other Ambulatory Visit (HOSPITAL_BASED_OUTPATIENT_CLINIC_OR_DEPARTMENT_OTHER): Payer: Self-pay

## 2023-11-28 ENCOUNTER — Other Ambulatory Visit (HOSPITAL_BASED_OUTPATIENT_CLINIC_OR_DEPARTMENT_OTHER): Payer: Self-pay

## 2023-12-01 ENCOUNTER — Encounter: Payer: Self-pay | Admitting: Family Medicine

## 2023-12-01 ENCOUNTER — Ambulatory Visit: Payer: Self-pay

## 2023-12-01 ENCOUNTER — Ambulatory Visit (INDEPENDENT_AMBULATORY_CARE_PROVIDER_SITE_OTHER): Admitting: Family Medicine

## 2023-12-01 ENCOUNTER — Other Ambulatory Visit (HOSPITAL_BASED_OUTPATIENT_CLINIC_OR_DEPARTMENT_OTHER): Payer: Self-pay

## 2023-12-01 VITALS — BP 102/58 | HR 88 | Temp 98.0°F | Ht 72.0 in | Wt 250.5 lb

## 2023-12-01 DIAGNOSIS — R519 Headache, unspecified: Secondary | ICD-10-CM

## 2023-12-01 MED ORDER — PREDNISONE 10 MG PO TABS
ORAL_TABLET | ORAL | 0 refills | Status: DC
  Filled 2023-12-01: qty 18, 9d supply, fill #0

## 2023-12-01 MED ORDER — TIZANIDINE HCL 2 MG PO TABS
2.0000 mg | ORAL_TABLET | Freq: Three times a day (TID) | ORAL | 0 refills | Status: DC | PRN
  Filled 2023-12-01: qty 30, 10d supply, fill #0

## 2023-12-01 NOTE — Telephone Encounter (Signed)
 FYI Only or Action Required?: FYI only for provider.  Patient was last seen in primary care on 09/26/2023 by de Peru, Quintin PARAS, MD.  Called Nurse Triage reporting Sinusitis and Hospitalization Follow-up.  Symptoms began several weeks ago.  Interventions attempted: Prescription medications: amoxicillin -clavulanate (AUGMENTIN ).  Symptoms are: unchanged.  Triage Disposition: See HCP Within 4 Hours (Or PCP Triage)  Patient/caregiver understands and will follow disposition?: Yes        Copied from CRM [id number]. Topic: Clinical - Red Word Triage >> Dec 01, 2023  8:05 AM Carlyon D wrote: Red Word that prompted transfer to Nurse Triage: severe headache, pt can not sleep due to the head pain.  pt was diagnosed with sinus infection on Saturday. Pt states its getting no better at all. Reason for Disposition  [1] SEVERE sinus pain (e.g., excruciating) AND [2] not improved 2 hours after pain medicine  Additional Information  Commented on: Answer Assessment    Pt with recent dx of sinus infection by ED and was discharged home with abx. Pt started abx Monday and nearing completion without relief. Scheduled with alternate [facility].  Answer Assessment - Initial Assessment Questions 1. MAIN CONCERN OR SYMPTOM:  What is your main concern right now? What question do you have? What's the main symptom you're worried about? (e.g., breathing difficulty, ankle swelling, weight gain.)     Headache - my head it feels stopped up when I lay down, it's like a weight pulling you down 2. ONSET: When did the  sx  start?     2 weeks  3. BETTER-SAME-WORSE: Are you getting better, staying the same, or getting worse compared to the day you were discharged?     same 4. HOSPITALIZATION: How long were you hospitalized? (e.g., days)     11/26/23 5. DISCHARGE DIAGNOSIS:  What problem or disease were you hospitalized for?     Sinus infection 6. DISCHARGE DATE: What date were you discharged from the  hospital?     D/C same day 7. DISCHARGE DOCTOR: Who is the main doctor taking care of you now?     See chart 8. DISCHARGE APPOINTMENT: Have you scheduled a follow-up discharge appointment with your doctor?     Calling to schedule 9. DISCHARGE MEDICINES: Did the doctor (or NP/PA) who discharged you order any new medicines for you to use? If yes, have you filled the prescription and started taking the medicine?      See chart 10. PAIN: Is there any pain? If Yes, ask: How bad is it?  (Scale 0-10; or none, mild, moderate, severe)       yes 11. FEVER: Do you have a fever? If Yes, ask: What is it, how was it measured  and when did it start?       denies 12. OTHER SYMPTOMS: Do you have any other symptoms?       denies  Protocols used: Post-Hospitalization Follow-up Call-A-AH, Sinus Infection on Antibiotic Follow-up Call-A-AH

## 2023-12-01 NOTE — Progress Notes (Signed)
   Subjective:    Patient ID: Jeff Johnson, male    DOB: 1945/05/02, 78 y.o.   MRN: [id number]  HPI HA- pt was seen at [facility] (bc PCP does not have availability to Oct) who then sent him to the ER on 9/13 w/ complaint of HA.  HA is L sided.  Given IVF, Reglan , and Decadron .  CT was unremarkable w/ exception of minimal secretions in L sphenoid sinus- which was tx'd w/ Augmentin .  Pt reports pain is unchanged despite 3.5 days of abx.  Is not able to sleep due to pain.  No TTP.  Pain is described as a pressure.  Tylenol  provides some relief.   Review of Systems For ROS see HPI     Objective:   Physical Exam Vitals reviewed.  Constitutional:      General: He is not in acute distress.    Appearance: He is well-developed. He is obese. He is not ill-appearing.  HENT:     Head: Normocephalic and atraumatic.     Right Ear: Tympanic membrane and ear canal normal.     Left Ear: Tympanic membrane and ear canal normal.     Nose: No congestion.     Comments: No TTP over frontal or maxillary sinuses Eyes:     General: No scleral icterus.    Extraocular Movements: Extraocular movements intact.     Pupils: Pupils are equal, round, and reactive to light.  Cardiovascular:     Rate and Rhythm: Normal rate and regular rhythm.  Pulmonary:     Effort: Pulmonary effort is normal. No respiratory distress.  Musculoskeletal:     Cervical back: Neck supple. Tenderness (TTP over L upper trap that reproduces the pain in his head) present.  Skin:    General: Skin is warm and dry.  Neurological:     Mental Status: He is alert and oriented to person, place, and time.     Gait: Gait abnormal (ambulates w/ a cane).           Assessment & Plan:  Occipital pain- new.  Reviewed ER notes and CT scan done on 9/13.  CT showed 'minimal secretions on L sphenoid sinus'.  They told him this was the cause of his pain, started him on Augmentin , and sent him home.  Given that CT said 'minimal secretions' this does not  seem consistent w/ sinusitis.  Today in office, his pain is reproducible by palpating his L upper trap- which sends a shooting pain up the back of his head.  He states it doesn't feel like the pain is on his scalp- it feels deeper than that.  Will start low dose Prednisone  taper to improve inflammation that is likely causing nerve to fire- avoiding NSAIDs since he is on Eliquis .  Will also start low dose Tizanidine  to use for spasm and sleep.  Encouraged him to f/u w/ PCP in 1 week.  Reviewed supportive care and red flags that should prompt return.  Pt expressed understanding and is in agreement w/ plan.

## 2023-12-01 NOTE — Patient Instructions (Signed)
 Follow up in 1 week w/ PCP for your head pain START the Prednisone  as directed for pain and inflammation- 3 pills at the same time x3 days, then 2 pills at the same time x3 days, then 1 pill daily.  Take w/ food USE the Tizanidine  to help w/ muscle spasm and tightness Use a heating pad to help w/ pain and spasm relief If your pain changes or worsens, please let your doctor (or us ) know Call with any questions or concerns Hang in there!!

## 2023-12-01 NOTE — Telephone Encounter (Signed)
 Routing to Dr Tommi Rumps [place] for review.

## 2023-12-02 ENCOUNTER — Other Ambulatory Visit: Payer: Self-pay

## 2023-12-02 ENCOUNTER — Other Ambulatory Visit (HOSPITAL_BASED_OUTPATIENT_CLINIC_OR_DEPARTMENT_OTHER): Payer: Self-pay

## 2023-12-02 NOTE — Telephone Encounter (Signed)
 Please call patient and schedule for an appt in the next week

## 2023-12-02 NOTE — Progress Notes (Signed)
 Remote PPM Transmission

## 2023-12-05 ENCOUNTER — Other Ambulatory Visit (HOSPITAL_BASED_OUTPATIENT_CLINIC_OR_DEPARTMENT_OTHER): Payer: Self-pay

## 2023-12-09 ENCOUNTER — Encounter: Admitting: Cardiology

## 2023-12-12 ENCOUNTER — Ambulatory Visit (INDEPENDENT_AMBULATORY_CARE_PROVIDER_SITE_OTHER): Admitting: Family Medicine

## 2023-12-12 ENCOUNTER — Other Ambulatory Visit (HOSPITAL_BASED_OUTPATIENT_CLINIC_OR_DEPARTMENT_OTHER): Payer: Self-pay

## 2023-12-12 ENCOUNTER — Other Ambulatory Visit: Payer: Self-pay

## 2023-12-12 ENCOUNTER — Encounter (HOSPITAL_BASED_OUTPATIENT_CLINIC_OR_DEPARTMENT_OTHER): Payer: Self-pay | Admitting: Family Medicine

## 2023-12-12 VITALS — BP 78/38 | HR 71 | Ht 72.0 in | Wt 249.4 lb

## 2023-12-12 DIAGNOSIS — I1A0 Resistant hypertension: Secondary | ICD-10-CM | POA: Diagnosis not present

## 2023-12-12 DIAGNOSIS — M791 Myalgia, unspecified site: Secondary | ICD-10-CM | POA: Diagnosis not present

## 2023-12-12 NOTE — Assessment & Plan Note (Signed)
 Patient continues with Entresto  as per cardiology.  He has been checking blood pressure regularly at home and has generally had fairly good control with occasional elevated readings as well as some low blood pressure readings.  He denies any new symptoms.  He reports that at baseline he does have some orthostatic symptoms, however these are unchanged over the past few years.  He does have some questions today about blood pressure being lower. Blood pressure is low in office today.  He is currently asymptomatic regarding this.  Cardiovascular exam with regular rate and rhythm, does have systolic murmur present.  Lungs clear to auscultation bilaterally. We discussed considerations, given that he is currently asymptomatic, recommend continuing to monitor blood pressure closely.  He does have appointment upcoming with cardiology later this week.  Encouraged to continue with close follow-up with cardiology for monitoring and management.  He also has appointment with his general cardiologist in a few weeks.

## 2023-12-12 NOTE — Progress Notes (Signed)
    Procedures performed today:    None.  Independent interpretation of notes and tests performed by another provider:   None.  Brief History, Exam, Impression, and Recommendations:    BP (!) 78/38 (BP Location: Left Arm, Patient Position: Sitting, Cuff Size: Large)   Pulse 71   Ht 6' (1.829 m)   Wt 249 lb 6.4 oz (113.1 kg)   SpO2 98%   BMI 33.82 kg/m   Resistant hypertension Assessment & Plan: Patient continues with Entresto  as per cardiology.  He has been checking blood pressure regularly at home and has generally had fairly good control with occasional elevated readings as well as some low blood pressure readings.  He denies any new symptoms.  He reports that at baseline he does have some orthostatic symptoms, however these are unchanged over the past few years.  He does have some questions today about blood pressure being lower. Blood pressure is low in office today.  He is currently asymptomatic regarding this.  Cardiovascular exam with regular rate and rhythm, does have systolic murmur present.  Lungs clear to auscultation bilaterally. We discussed considerations, given that he is currently asymptomatic, recommend continuing to monitor blood pressure closely.  He does have appointment upcoming with cardiology later this week.  Encouraged to continue with close follow-up with cardiology for monitoring and management.  He also has appointment with his general cardiologist in a few weeks.   Trigger point Assessment & Plan: Patient recently had evaluation in the emergency department as well as outpatient clinic.  He initially was started on antibiotics for presumed sinus infection, however on evaluation with outpatient clinic, it was felt that his symptoms were more likely related to musculoskeletal cause, particular trigger point.  He was treated with a short course of steroids, also prescribed muscle relaxer which he did take initially, however has not taken for the past few  days. On exam, patient does have some tenderness to palpation through periscapular muscles, primarily along medial border of scapula within thoracic region.  Palpation over specific area that does lead to radiation of pain and symptoms up into head as well as down the back. Symptoms do seem primarily related to likely trigger point.  He does indicate some mild sinus congestion, discussed conservative measures for management of this.  He does utilize no spray, thinks Flonase.  We discussed optimization of this.  Also discussed use of nasal saline spray. Symptoms related to trigger point are improved after medications, however still present.  We discussed management options.  We discussed consideration of referral to physical therapy which I feel would be very helpful for patient.  He would prefer to hold off on this for now.  Advised that if symptoms do persist and he would like to proceed with referral, to let us  know and we can place referral   Return in about 3 months (around 03/12/2024).   ___________________________________________ Jeff Reigel de [place], MD, ABFM, CAQSM Primary Care and Sports Medicine [facility]

## 2023-12-12 NOTE — Assessment & Plan Note (Signed)
 Patient recently had evaluation in the emergency department as well as outpatient clinic.  He initially was started on antibiotics for presumed sinus infection, however on evaluation with outpatient clinic, it was felt that his symptoms were more likely related to musculoskeletal cause, particular trigger point.  He was treated with a short course of steroids, also prescribed muscle relaxer which he did take initially, however has not taken for the past few days. On exam, patient does have some tenderness to palpation through periscapular muscles, primarily along medial border of scapula within thoracic region.  Palpation over specific area that does lead to radiation of pain and symptoms up into head as well as down the back. Symptoms do seem primarily related to likely trigger point.  He does indicate some mild sinus congestion, discussed conservative measures for management of this.  He does utilize no spray, thinks Flonase.  We discussed optimization of this.  Also discussed use of nasal saline spray. Symptoms related to trigger point are improved after medications, however still present.  We discussed management options.  We discussed consideration of referral to physical therapy which I feel would be very helpful for patient.  He would prefer to hold off on this for now.  Advised that if symptoms do persist and he would like to proceed with referral, to let us  know and we can place referral

## 2023-12-12 NOTE — Patient Instructions (Signed)

## 2023-12-13 NOTE — Progress Notes (Unsigned)
  Electrophysiology Office Note:   ID:  Claborn, Janusz 1945-11-30, MRN [id number]  Primary Cardiologist: Shelda Bruckner, MD Electrophysiologist: OLE ONEIDA HOLTS, MD  {Click to update primary MD,subspecialty MD or APP then REFRESH:1}    History of Present Illness:   Jeff Johnson is a 78 y.o. male with h/o HLD, AAA, AI s/p AVR, symptomatic bradycardia, Mobitz II AV block s/p PPM, prostate cancer, chronic diastolic heart failure, HTN, BPH, and atrial fibrillation seen today for routine electrophysiology follow-up s/p Ablation.  Since last being seen in our clinic the patient reports doing ***.  he denies chest pain, palpitations, dyspnea, PND, orthopnea, nausea, vomiting, dizziness, syncope, edema, weight gain, or early satiety.    Review of systems complete and found to be negative unless listed in HPI.   EP Information / Studies Reviewed:    EKG is ordered today. Personal review as below.       PPM Interrogation-  reviewed in detail today,  See PACEART report.  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   Wt Readings from Last 3 Encounters:  12/12/23 249 lb 6.4 oz (113.1 kg)  12/01/23 250 lb 8 oz (113.6 kg)  11/26/23 237 lb (107.5 kg)     GEN: No acute distress  NECK: No JVD; No carotid bruits CARDIAC: {EPRHYTHM:28826}, no murmurs, rubs, gallops RESPIRATORY:  Clear to auscultation without rales, wheezing or rhonchi  ABDOMEN: Soft, non-tender, non-distended EXTREMITIES:  {EDEMA LEVEL:28147::No} edema; No deformity   ASSESSMENT AND PLAN:    Symptomatic bradycardia s/p Abbott PPM  Normal PPM function See Pace Art report No changes today  Persistent AF S/p PVI 08/2023 Continue Eliquis  5 mg BID for CHA2DS2VASc of at least 4 *** burden by brief device check   Secondary hypercoagulable  state Pt on Eliquis  as above   {Click here to Review PMH, Prob List, Meds, Allergies, SHx, FHx  :1}   Disposition:   Follow up with {EPPROVIDERS:28135::EP Team} {EPFOLLOW UP:28173}  Signed, Ozell Prentice Passey, PA-C

## 2023-12-14 ENCOUNTER — Other Ambulatory Visit (HOSPITAL_BASED_OUTPATIENT_CLINIC_OR_DEPARTMENT_OTHER): Payer: Self-pay

## 2023-12-14 ENCOUNTER — Encounter: Payer: Self-pay | Admitting: Student

## 2023-12-14 ENCOUNTER — Ambulatory Visit: Attending: Student | Admitting: Student

## 2023-12-14 VITALS — BP 96/61 | HR 71 | Ht 72.0 in | Wt 244.0 lb

## 2023-12-14 DIAGNOSIS — I480 Paroxysmal atrial fibrillation: Secondary | ICD-10-CM | POA: Diagnosis not present

## 2023-12-14 DIAGNOSIS — I10 Essential (primary) hypertension: Secondary | ICD-10-CM

## 2023-12-14 DIAGNOSIS — D6859 Other primary thrombophilia: Secondary | ICD-10-CM

## 2023-12-14 LAB — BASIC METABOLIC PANEL WITH GFR
BUN/Creatinine Ratio: 11 (ref 10–24)
BUN: 14 mg/dL (ref 8–27)
CO2: 23 mmol/L (ref 20–29)
Calcium: 9.2 mg/dL (ref 8.6–10.2)
Chloride: 101 mmol/L (ref 96–106)
Creatinine, Ser: 1.24 mg/dL (ref 0.76–1.27)
Glucose: 107 mg/dL — ABNORMAL HIGH (ref 70–99)
Potassium: 3.8 mmol/L (ref 3.5–5.2)
Sodium: 140 mmol/L (ref 134–144)
eGFR: 59 mL/min/1.73 — ABNORMAL LOW (ref >59)

## 2023-12-14 NOTE — Patient Instructions (Signed)
 Medication Instructions:  Hold entresto  tonight and tomorrow morning. Resume tomorrow night if blood pressure is ok. *If you need a refill on your cardiac medications before your next appointment, please call your pharmacy*  Lab Work: BMET-TODAY If you have labs (blood work) drawn today and your tests are completely normal, you will receive your results only by: MyChart Message (if you have MyChart) OR A paper copy in the mail If you have any lab test that is abnormal or we need to change your treatment, we will call you to review the results.  Follow-Up: At [facility], you and your health needs are our priority.  As part of our continuing mission to provide you with exceptional heart care, our providers are all part of one team.  This team includes your primary Cardiologist (physician) and Advanced Practice Providers or APPs (Physician Assistants and Nurse Practitioners) who all work together to provide you with the care you need, when you need it.  Your next appointment:   6 month(s)  Provider:   Ole Holts, MD or APP

## 2023-12-15 ENCOUNTER — Ambulatory Visit: Payer: Self-pay | Admitting: Student

## 2023-12-15 ENCOUNTER — Other Ambulatory Visit (HOSPITAL_BASED_OUTPATIENT_CLINIC_OR_DEPARTMENT_OTHER): Payer: Self-pay

## 2023-12-21 DIAGNOSIS — H2513 Age-related nuclear cataract, bilateral: Secondary | ICD-10-CM | POA: Diagnosis not present

## 2023-12-21 DIAGNOSIS — H532 Diplopia: Secondary | ICD-10-CM | POA: Diagnosis not present

## 2023-12-21 DIAGNOSIS — H02403 Unspecified ptosis of bilateral eyelids: Secondary | ICD-10-CM | POA: Diagnosis not present

## 2024-01-04 ENCOUNTER — Encounter (HOSPITAL_BASED_OUTPATIENT_CLINIC_OR_DEPARTMENT_OTHER): Payer: Self-pay

## 2024-01-06 ENCOUNTER — Ambulatory Visit (HOSPITAL_BASED_OUTPATIENT_CLINIC_OR_DEPARTMENT_OTHER): Admitting: Cardiology

## 2024-01-06 ENCOUNTER — Other Ambulatory Visit (HOSPITAL_BASED_OUTPATIENT_CLINIC_OR_DEPARTMENT_OTHER): Payer: Self-pay

## 2024-01-06 ENCOUNTER — Telehealth (HOSPITAL_BASED_OUTPATIENT_CLINIC_OR_DEPARTMENT_OTHER): Payer: Self-pay | Admitting: Cardiology

## 2024-01-06 ENCOUNTER — Encounter (HOSPITAL_BASED_OUTPATIENT_CLINIC_OR_DEPARTMENT_OTHER): Payer: Self-pay | Admitting: Cardiology

## 2024-01-06 VITALS — BP 92/54 | HR 64 | Ht 72.0 in | Wt 251.4 lb

## 2024-01-06 DIAGNOSIS — I959 Hypotension, unspecified: Secondary | ICD-10-CM

## 2024-01-06 DIAGNOSIS — D6859 Other primary thrombophilia: Secondary | ICD-10-CM | POA: Diagnosis not present

## 2024-01-06 DIAGNOSIS — I480 Paroxysmal atrial fibrillation: Secondary | ICD-10-CM

## 2024-01-06 DIAGNOSIS — Z7901 Long term (current) use of anticoagulants: Secondary | ICD-10-CM | POA: Diagnosis not present

## 2024-01-06 DIAGNOSIS — Z952 Presence of prosthetic heart valve: Secondary | ICD-10-CM

## 2024-01-06 DIAGNOSIS — Z950 Presence of cardiac pacemaker: Secondary | ICD-10-CM

## 2024-01-06 DIAGNOSIS — I5022 Chronic systolic (congestive) heart failure: Secondary | ICD-10-CM | POA: Diagnosis not present

## 2024-01-06 DIAGNOSIS — E782 Mixed hyperlipidemia: Secondary | ICD-10-CM

## 2024-01-06 MED ORDER — LOSARTAN POTASSIUM 25 MG PO TABS
25.0000 mg | ORAL_TABLET | Freq: Every day | ORAL | 1 refills | Status: DC
  Filled 2024-01-06: qty 90, 90d supply, fill #0

## 2024-01-06 NOTE — Telephone Encounter (Signed)
 Pt c/o medication issue:  1. Name of Medication:   Losartan   2. How are you currently taking this medication (dosage and times per day)?   3. Are you having a reaction (difficulty breathing--STAT)?   4. What is your medication issue?   Patient called to report to Dr. Lonni as requested that he is not taking Losartan .

## 2024-01-06 NOTE — Telephone Encounter (Signed)
 Patient notified.  Losartan  prescription sent to pharmacy at [facility]

## 2024-01-06 NOTE — Patient Instructions (Addendum)
 Go home and check your medication bottles. You should not be on both entresto  (sacubitril -valsartan ) and losartan . -if you are taking both, stop losartan  and continue entresto  -if you are taking entresto  only, let us  know. We will stop entresto  and start low dose losartan  25 mg daily -continue to monitor blood pressures and symptoms  CANNOT TAKE BOTH ENTRESTO  AND LOSARTAN    Lab Work: none If you have labs (blood work) drawn today and your tests are completely normal, you will receive your results only by: MyChart Message (if you have MyChart) OR A paper copy in the mail If you have any lab test that is abnormal or we need to change your treatment, we will call you to review the results.  Testing/Procedures: none  Follow-Up: At [facility], you and your health needs are our priority.  As part of our continuing mission to provide you with exceptional heart care, our providers are all part of one team.  This team includes your primary Cardiologist (physician) and Advanced Practice Providers or APPs (Physician Assistants and Nurse Practitioners) who all work together to provide you with the care you need, when you need it.  Your next appointment:   November 24 at 8:50  Provider:   Reche Finder, NP    We recommend signing up for the patient portal called MyChart.  Sign up information is provided on this After Visit Summary.  MyChart is used to connect with patients for Virtual Visits (Telemedicine).  Patients are able to view lab/test results, encounter notes, upcoming appointments, etc.  Non-urgent messages can be sent to your provider as well.   To learn more about what you can do with MyChart, go to [email or web address].   Other Instructions

## 2024-01-06 NOTE — Progress Notes (Signed)
 Cardiology Office Note:  .    Date:  01/06/2024  ID:  [name], Jeff Johnson 16-Apr-1945, MRN [id number] PCP: de Johnson, Jeff PARAS, MD  [facility] Providers Cardiologist:  Jeff Bruckner, MD Cardiology APP:  Jeff Reche RAMAN, NP  Electrophysiologist:  Jeff ONEIDA HOLTS, MD     History of Present Illness: .    Jeff Johnson is a 78 y.o. male with a hx of hypertension, hyperlipidemia, aortic valve disease, s/p Bentall procedure, DC-PPM placed 02/2023 for heart block/symptomatic bradycardia, paroxysmal atrial fibrillation s/p ablation 08/2023 who presents for follow-up today. He was initially seen 08/12/2022 as a new consult at the request of Jeff Darice SAUNDERS, FNP for the evaluation and management of resistant hypertension. He was previously an established patient with Dr. Alveta.    Today: Underwent afib ablation 08/2023. He notes that last week his heart rate jumped up when he was under stress. His apple watch told him he had 54% burden of afib  Overall doing well, but has started having headaches. Went to urgent care, told it was sinus headaches, but didn't improve with antibiotics. Went to see Dr. Mahlon, pain was reproducible on palpation of L trapezius. Given prednisone  for inflammation and tizanidine . Did help some, does still get intermittent headaches but not as severe.  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  Brings BP log today. Range is variable, 85/57-133/84. Has multiple days of systolic BP less than 100. Takes one lasix  most days, has two days in the last few weeks where he had to take two doses of lasix . Had his BP monitor checked at Dr. Omie office and it is accurate. He thinks he is taking the losartan  and the entresto --this is not on his list.   No significant LE edema. Breathing stable.   ROS:  Denies chest pain. No PND, orthopnea. No syncope or palpitations. ROS otherwise negative except as noted.   Studies Reviewed: Jeff Johnson       No  new today  Physical Exam:    VS:  BP (!) 92/54   Pulse 64   Ht 6' (1.829 m)   Wt 251 lb 6.4 oz (114 kg)   SpO2 98%   BMI 34.10 kg/m    Wt Readings from Last 3 Encounters:  01/06/24 251 lb 6.4 oz (114 kg)  12/14/23 244 lb (110.7 kg)  12/12/23 249 lb 6.4 oz (113.1 kg)    GEN: Well nourished, well developed in no acute distress HEENT: Normal, moist mucous membranes NECK: No JVD CARDIAC: regular rhythm, normal S1 and S2, no rubs or gallops. 2/6 murmur. VASCULAR: Radial and DP pulses 2+ bilaterally. No carotid bruits RESPIRATORY:  Clear to auscultation without rales, wheezing or rhonchi  ABDOMEN: Soft, non-tender, non-distended MUSCULOSKELETAL:  Ambulates independently SKIN: Warm and dry, no edema NEUROLOGIC:  Alert and oriented x 3. No focal neuro deficits noted. PSYCHIATRIC:  Normal affect   ASSESSMENT AND PLAN: .    Chronic diastolic heart failure vs. 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 We discussed changing to losartan  but he thinks he is still taking this with the entresto . He will go home and check his  medication bottles and let us  know. Should not be on both entresto  and losartan . If he is on entresto  alone, would stop this and change to losartan  25 mg daily. If he is taking both entresto  and losartan , would stop losartan  and monitor blood pressure.  Paroxysmal atrial fibrillation s/p ablation 08/2023 -doing well post ablation. Monitoring burden with pacemaker. Defer to EP team as to whether we can stop apixaban  if his burden is minimal. With his apple watch noting 54% burden of afib in a recent week, would need to compare to  his next device interrogation -CHA2DS2/VAS Stroke Risk Points=4  -on apixaban  5 mg BID  Hypertension history, now with more hypotension -with history of lability, trending more recently towards hypotension -see above re: medication changes (entresto  vs. ARB)  S/P AVR with aortic root repair -continue to monitor symptoms -on aspirin  given valve. As he is also on apixaban , watch closely for bleeding -needs antibiotic prophylaxis prior to dental procedures   Mixed hyperlipidemia -continue simvastatin   Symptomatic bradycardia/intermittent high grade AV block -s/p PPM 02/2023  Dispo: Follow-up in 3 mos or sooner as needed  Signed, Jeff Bruckner, MD

## 2024-01-06 NOTE — Telephone Encounter (Signed)
 I spoke with patient.  He is not taking losartan .  He is taking Entresto .

## 2024-01-07 ENCOUNTER — Other Ambulatory Visit (HOSPITAL_BASED_OUTPATIENT_CLINIC_OR_DEPARTMENT_OTHER): Payer: Self-pay

## 2024-01-13 NOTE — Telephone Encounter (Signed)
 Spoke with patient who stated that he feels he is having more episodes of Afib since changing his Entresto  to Losartan .  Blood pressures running 120/60's-70's HR 70's-128 Tuesday felt like his heart was fluttering  Feeling very tired   Will forward to device to see if able to pull Afib burden and Jeff T PA for review

## 2024-01-13 NOTE — Telephone Encounter (Signed)
 Outreach made to Pt.  He is not home right now.  He states he will be home around 2-3 pm this afternoon.  He will send a transmission at that time.  Advised would call him back this afternoon.

## 2024-01-13 NOTE — Telephone Encounter (Signed)
 STAT if HR is under 50 or over 120 (normal HR is 60-100 beats per minute)  What is your heart rate?    97 - last night  79 - this morning after calming down  Do you have a log of your heart rate readings (document readings)?   Yes  Do you have any other symptoms? No   Patient stated his BP has stabilized but his HR as been trending 90's-128 on his losartan  (COZAAR ) 25 MG tablet medication.  Patient wants advice on next steps.

## 2024-01-13 NOTE — Telephone Encounter (Addendum)
 Called and assisted patient with sending manual transmission  Transmission received and reviewed  Presenting EGM c/w Afib with not well controlled v-rates  Episode summary shows episode began 10/29 and is ongoing  Patient reports feeling very tired but has not had any fluttering sensations for a day or two now  Patient also has reported symptoms c/w orthostatic hypotension  Patient advised over the weekend to check BP in morning prior to taking meds, and if SBP <100 mmHg to hold off on BP med (losartan ) to avoid bottoming out and we can figure out a new game plan come Monday. Patient verbalized understanding.  ED precautions reviewed with patient going into the weekend and patient verbalized understanding   Will route to EP and primary cards providers for awareness and advisement

## 2024-01-16 ENCOUNTER — Other Ambulatory Visit (HOSPITAL_BASED_OUTPATIENT_CLINIC_OR_DEPARTMENT_OTHER): Payer: Self-pay

## 2024-01-16 NOTE — Telephone Encounter (Signed)
 Called patient and got them scheduled in Afib clinic tomorrow at 1530p  Patient very appreciative of scheduling assistance and phone call

## 2024-01-17 ENCOUNTER — Ambulatory Visit (HOSPITAL_COMMUNITY)
Admission: RE | Admit: 2024-01-17 | Discharge: 2024-01-17 | Disposition: A | Discharge status: Home / Self Care | Source: Ambulatory Visit | Attending: Internal Medicine | Admitting: Internal Medicine

## 2024-01-17 ENCOUNTER — Other Ambulatory Visit (HOSPITAL_COMMUNITY): Payer: Self-pay

## 2024-01-17 ENCOUNTER — Encounter (HOSPITAL_COMMUNITY): Payer: Self-pay | Admitting: Internal Medicine

## 2024-01-17 ENCOUNTER — Telehealth (HOSPITAL_COMMUNITY): Payer: Self-pay

## 2024-01-17 VITALS — BP 98/60 | HR 83 | Ht 72.0 in | Wt 251.0 lb

## 2024-01-17 DIAGNOSIS — D6869 Other thrombophilia: Secondary | ICD-10-CM

## 2024-01-17 DIAGNOSIS — I4819 Other persistent atrial fibrillation: Secondary | ICD-10-CM | POA: Diagnosis not present

## 2024-01-17 DIAGNOSIS — I480 Paroxysmal atrial fibrillation: Secondary | ICD-10-CM

## 2024-01-17 NOTE — Progress Notes (Addendum)
 Primary Care Physician: de [place], Quintin PARAS, MD Primary Cardiologist: Shelda Bruckner, MD Electrophysiologist: OLE ONEIDA HOLTS, MD     Referring Physician: [facility] clinic alert     Jeff Johnson is a 78 y.o. male with a history of HLD, AAA, AI s/p AVR, symptomatic bradycardia, Mobitz II AV block s/p PPM, prostate cancer, systolic HF, chronic diastolic heart failure, HTN, BPH, and atrial fibrillation who presents for consultation in the [facility]. Device clinic alert on 3/31 for ongoing Afib with RVR since 3/30. Patient is on ASA 81 mg daily. He has a CHADS2VASC score of 4.  On evaluation today, he is currently in Afib. He feels possibly more SOB and tired than usual. He denies having any OSA due to frequent urination at night. He takes ASA 81 mg daily.    On follow up 07/05/23, he is currently in Afib. Patient notes via Apple watch his Afib has been paroxysmal since last office visit. He began Eliquis  5 mg BID in addition to ASA 81 mg daily. No missed doses of Eliquis .   On follow up 09/26/23, patient is currently in AV paced rhythm. S/p Afib ablation on 08/29/23 by Dr. Holts. No episodes of Afib since ablation. No chest pain or SOB. Leg sites healed without issue. No missed doses of anticoagulant.  On follow up 01/17/24, patient is currently in Afib. Device clinic alert on 10/31 showing ongoing Afib since 10/29 with not well-controlled HR. No missed doses of Eliquis .   Today, he denies symptoms of orthopnea, PND, lower extremity edema, dizziness, presyncope, syncope, snoring, daytime somnolence, bleeding, or neurologic sequela. The patient is tolerating medications without difficulties and is otherwise without complaint today.    he has a BMI of Body mass index is 34.04 kg/m.SABRA Filed Weights   01/17/24 1511  Weight: 113.9 kg      Current Outpatient Medications  Medication Sig Dispense Refill   acetaminophen  (TYLENOL ) 650 MG CR tablet Take 1,300  mg by mouth 3 (three) times daily as needed for pain or fever.     apixaban  (ELIQUIS ) 5 MG TABS tablet Take 1 tablet (5 mg total) by mouth 2 (two) times daily. 60 tablet 11   ascorbic acid (VITAMIN C) 500 MG tablet Take 500 mg by mouth in the morning.     ASPIRIN  81 PO Take 1 tablet by mouth in the morning.     Cholecalciferol (VITAMIN D  PO) Take 5,000 Units by mouth in the morning.     dapagliflozin  propanediol (FARXIGA ) 10 MG TABS tablet Take 1 tablet (10 mg total) by mouth daily before breakfast. 90 tablet 2   furosemide  (LASIX ) 20 MG tablet Take 1 tablet (20 mg total) by mouth daily. Take additional 20mg  as needed for weight gain of 2 lbs overnight of 5 lbs in one week. 135 tablet 1   lansoprazole  (PREVACID ) 15 MG capsule Take 1 capsule (15 mg total) by mouth daily at 12 noon. 30 capsule 3   losartan  (COZAAR ) 25 MG tablet Take 1 tablet (25 mg total) by mouth daily. 90 tablet 1   montelukast  (SINGULAIR ) 10 MG tablet Take 1 tablet (10 mg total) by mouth daily. 30 tablet 3   Multiple Vitamins-Minerals (PRESERVISION AREDS 2) CAPS Take 1 capsule by mouth in the morning and at bedtime.     pantoprazole  (PROTONIX ) 40 MG tablet Take 1 tablet (40 mg total) by mouth daily. (Pause lansoprazole ) 45 tablet 0   simvastatin  (ZOCOR ) 20 MG tablet Take 1  tablet (20 mg total) by mouth daily. 30 tablet 3   tamsulosin  (FLOMAX ) 0.4 MG CAPS capsule Take 1 capsule (0.4 mg total) by mouth daily. 30 capsule 6   zinc gluconate 50 MG tablet Take 50-75 mg by mouth in the morning. Patient takes 50/75 daily     No current facility-administered medications for this encounter.    Atrial Fibrillation Management history:  Previous antiarrhythmic drugs: none Previous cardioversions: none Previous ablations: 08/29/23 Anticoagulation history: Eliquis    ROS- All systems are reviewed and negative except as per the HPI above.  Physical Exam: BP 98/60   Pulse 83   Ht 6' (1.829 m)   Wt 113.9 kg   BMI 34.04 kg/m   GEN-  The patient is well appearing, alert and oriented x 3 today.   Neck - no JVD or carotid bruit noted Lungs- Clear to ausculation bilaterally, normal work of breathing Heart- Irregular rate and rhythm, no murmurs, rubs or gallops, PMI not laterally displaced Extremities- no clubbing, cyanosis, or edema Skin - no rash or ecchymosis noted   EKG today demonstrates  Vent. rate 83 BPM PR interval * ms QRS duration 92 ms QT/QTcB 400/470 ms P-R-T axes * -41 72 Atrial fibrillation with premature ventricular or aberrantly conducted complexes Left axis deviation Possible Inferior infarct , age undetermined Abnormal ECG When compared with ECG of 14-Dec-2023 08:44, Atrial fibrillation has replaced Electronic ventricular pacemaker  Echo 08/29/23 (TEE): 1. Left ventricular ejection fraction, by estimation, is 40 to 45%. The  left ventricle has mildly decreased function. The left ventricle  demonstrates regional wall motion abnormalities (see scoring  diagram/findings for description).   2. Right ventricular systolic function is mildly reduced. The right  ventricular size is normal.   3. Left atrial size was severely dilated. No left atrial/left atrial  appendage thrombus was detected.   4. A small pericardial effusion is present. The pericardial effusion is  anterior to the right ventricle and surrounding the apex. There is no  evidence of cardiac tamponade.   5. The mitral valve is normal in structure. Mild mitral valve  regurgitation.   6. The aortic valve has been repaired/replaced. Aortic valve  regurgitation is not visualized. Aortic valve sclerosis/calcification is  present, without any evidence of aortic stenosis. 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  Procedure Date: 11/2009. Echo findings are consistent with normal  structure and function of the aortic valve prosthesis. Aortic valve mean  gradient measures 10.0 mmHg. Aortic valve Vmax measures 1.95 m/s.    7. Aortic dilatation noted. There is mild dilatation of the aortic root,  measuring 41 mm. There is Moderate (Grade III) layered plaque involving  the descending aorta.   ASSESSMENT & PLAN CHA2DS2-VASc Score = 4  The patient's score is based upon: CHF History: 1 HTN History: 1 Diabetes History: 0 Stroke History: 0 Vascular Disease History: 0 Age Score: 2 Gender Score: 0       ASSESSMENT AND PLAN: Persistent Atrial Fibrillation (ICD10:  I48.0) The patient's CHA2DS2-VASc score is 4, indicating a 4.8% annual risk of stroke.   S/p Afib ablation on 08/29/23 by Dr. Cindie.  Patient is currently in Afib. We had a discussion about medication treatments for rhythm control. We discussed potential options such as Tikosyn and amiodarone . We talked about the monitoring required for these medications, hospital admission for Tikosyn, and potential adverse effects of Tikosyn and amiodarone . Patient was previously on amiodarone  and I can see he stopped it due to  nausea, vomiting, and reflux. After discussion, patient is interested in Tikosyn for rhythm control. He does not wish to re challenge amiodarone . Will obtain Bmet and mag today. We went over Tikosyn admission and what to expect, which medications to avoid, and follow up after hospital admission.  Qtc 435-479 ms depending on measuring QT versus measuring from V spike. CrCl 77 mL/min from Bmet on 10/1.  Secondary Hypercoagulable State (ICD10:  D68.69) The patient is at significant risk for stroke/thromboembolism based upon his CHA2DS2-VASc Score of 4. Continue Apixaban  (Eliquis ).  No missed doses.    Follow up for Tikosyn admission.   I spent a total of 30 minutes with this patient today face to face. We discussed multiple treatment options for atrial fibrillation, including rate control and rhythm control.  I explained the possible risks vs benefits of medication and different possible methods of long term treatment. I discussed hospital  admission and the reason why it is indicated for Tikosyn. I answered multiple questions from patient and spouse during this time and reviewed his ECG and history during the visit.      Terra Pac, PA-C  Afib Clinic [facility] [address] [place], [place] [address] [phone]

## 2024-01-17 NOTE — Telephone Encounter (Signed)
 BP low in AFib Clinic today. If persistent lightheadedness, hypotension recommend reduce Losartan  to half tablet daily.   Kye Silverstein S Angeliki Mates, NP

## 2024-01-17 NOTE — Telephone Encounter (Signed)
 Pharmacy Patient Advocate Encounter  Insurance verification completed.    The patient is insured through [facility]. Patient has Medicare and is not eligible for a copay card, but may be able to apply for patient assistance or Medicare RX Payment Plan (Patient Must reach out to their plan, if eligible for payment plan), if available.    Ran test claim for Tikosyn 500mcg and the current 30 day co-pay is $0.   This test claim was processed through [employer]- copay amounts may vary at other pharmacies due to [employer], or as the patient moves through the different stages of their insurance plan.

## 2024-01-17 NOTE — Patient Instructions (Addendum)
 Tikosyn Hospital Admission is scheduled for:  02/14/24 TUESDAY Hold Farxiga  after 02/09/24   Come to the Afib Clinic for pre-admission work up at: 9:30 AM    Follow appointment after hospital stay  02/24/24 at 11:00 am    You may eat/drink prior to the appointment. Please take all your normal morning medications prior to arrival.   Medications that need to be held or stopped prior to admission date:   If you should miss any doses of your blood thinner going forward please notify our office immediately.   If you start any new medications after your admission is scheduled please notify our office immediately.  No Benadryl use at least 3 days prior to admission.   We will start the authorization process with your insurance for the hospital stay.   You may bring a suitcase when you check in to admissions with belongings for your stay - clothes to keep you comfortable and things to keep you busy.   All medications will be provided for you while in the hospital.  If you are on a CPAP machine - please bring with you to the hospital.    Let me know if you have any questions regarding the above instructions or about the admission in general.

## 2024-01-17 NOTE — Telephone Encounter (Signed)
 Returned a call back to the pt.  Pt is aware that Reche Finder, NP was notified by Afib Clinic that his BP was low at today's visit.    Pt is aware that per Caitlin, if persistent lightheadedness,  hypotension she recommends that he reduce his losartan  to 1/2 tablet by mouth daily.  Pt states he monitors his BP twice daily and when he checks this in the AM, if hypotensive/lightheaded he will reduce his losartan  to taking 1/2 tablet daily.  Pt verbalized understanding and agrees with this plan.

## 2024-01-18 ENCOUNTER — Ambulatory Visit (HOSPITAL_COMMUNITY): Payer: Self-pay | Admitting: Internal Medicine

## 2024-01-18 ENCOUNTER — Other Ambulatory Visit (HOSPITAL_BASED_OUTPATIENT_CLINIC_OR_DEPARTMENT_OTHER): Payer: Self-pay

## 2024-01-18 LAB — CBC
Hematocrit: 43.2 % (ref 37.5–51.0)
Hemoglobin: 14.2 g/dL (ref 13.0–17.7)
MCH: 29.3 pg (ref 26.6–33.0)
MCHC: 32.9 g/dL (ref 31.5–35.7)
MCV: 89 fL (ref 79–97)
Platelets: 111 x10E3/uL — ABNORMAL LOW (ref 150–450)
RBC: 4.84 x10E6/uL (ref 4.14–5.80)
RDW: 15.4 % (ref 11.6–15.4)
WBC: 5.4 x10E3/uL (ref 3.4–10.8)

## 2024-01-18 LAB — BASIC METABOLIC PANEL WITH GFR
BUN/Creatinine Ratio: 16 (ref 10–24)
BUN: 19 mg/dL (ref 8–27)
CO2: 27 mmol/L (ref 20–29)
Calcium: 9.2 mg/dL (ref 8.6–10.2)
Chloride: 103 mmol/L (ref 96–106)
Creatinine, Ser: 1.22 mg/dL (ref 0.76–1.27)
Glucose: 115 mg/dL — ABNORMAL HIGH (ref 70–99)
Potassium: 4 mmol/L (ref 3.5–5.2)
Sodium: 140 mmol/L (ref 134–144)
eGFR: 60 mL/min/1.73 (ref >59)

## 2024-01-18 LAB — MAGNESIUM: Magnesium: 1.9 mg/dL (ref 1.6–2.3)

## 2024-01-20 ENCOUNTER — Other Ambulatory Visit (HOSPITAL_BASED_OUTPATIENT_CLINIC_OR_DEPARTMENT_OTHER): Payer: Self-pay

## 2024-01-20 ENCOUNTER — Telehealth (HOSPITAL_COMMUNITY): Payer: Self-pay

## 2024-01-20 ENCOUNTER — Other Ambulatory Visit: Payer: Self-pay

## 2024-01-20 NOTE — Telephone Encounter (Signed)
 Initiated prior authorization to [facility] for Tikosyn admission. Date of service 02/14/2024 Reference # [id number] Fax # [phone] Faxed clinical information to [facility] for review.

## 2024-01-23 ENCOUNTER — Telehealth: Payer: Self-pay | Admitting: Pharmacist

## 2024-01-23 NOTE — Telephone Encounter (Signed)
 Medication list reviewed in anticipation of upcoming Tikosyn initiation.   Patient is taking furosemide  20 mg daily which may deplete serum K/Mag and increase risk for arrhythmia. Electrolytes should be monitored very closely while on Tikosyn and furosemide .   Patient is anticoagulated on apixaban  on the appropriate dose. Please ensure that patient has not missed any anticoagulation doses in the 3 weeks prior to Tikosyn initiation.   Patient will need to be counseled to avoid use of Benadryl while on Tikosyn and in the 2-3 days prior to Tikosyn initiation.  Nidia Schaffer, PharmD PGY2 Cardiology Pharmacy Resident

## 2024-01-27 NOTE — Telephone Encounter (Signed)
 Hospitalization approved for 02/14/24 Auth number [id number].

## 2024-01-28 ENCOUNTER — Other Ambulatory Visit (HOSPITAL_BASED_OUTPATIENT_CLINIC_OR_DEPARTMENT_OTHER): Payer: Self-pay

## 2024-02-01 ENCOUNTER — Other Ambulatory Visit (HOSPITAL_BASED_OUTPATIENT_CLINIC_OR_DEPARTMENT_OTHER): Payer: Self-pay

## 2024-02-02 ENCOUNTER — Other Ambulatory Visit (HOSPITAL_BASED_OUTPATIENT_CLINIC_OR_DEPARTMENT_OTHER): Payer: Self-pay

## 2024-02-02 ENCOUNTER — Ambulatory Visit (INDEPENDENT_AMBULATORY_CARE_PROVIDER_SITE_OTHER)

## 2024-02-02 DIAGNOSIS — I5022 Chronic systolic (congestive) heart failure: Secondary | ICD-10-CM

## 2024-02-02 LAB — ECHOCARDIOGRAM COMPLETE
AV Mean grad: 9 mmHg
AV Peak grad: 16.6 mmHg
Ao pk vel: 2.04 m/s
Area-P 1/2: 3.72 cm2
S' Lateral: 2.54 cm

## 2024-02-02 MED ORDER — AMOXICILLIN 500 MG PO CAPS
2000.0000 mg | ORAL_CAPSULE | ORAL | 1 refills | Status: AC
  Filled 2024-02-02: qty 12, 3d supply, fill #0
  Filled 2024-02-07: qty 12, 3d supply, fill #1

## 2024-02-02 MED ORDER — PERFLUTREN LIPID MICROSPHERE
1.0000 mL | INTRAVENOUS | Status: AC | PRN
  Administered 2024-02-02: 1 mL via INTRAVENOUS

## 2024-02-03 ENCOUNTER — Other Ambulatory Visit: Payer: Self-pay

## 2024-02-03 ENCOUNTER — Other Ambulatory Visit (HOSPITAL_BASED_OUTPATIENT_CLINIC_OR_DEPARTMENT_OTHER): Payer: Self-pay

## 2024-02-06 ENCOUNTER — Encounter (HOSPITAL_BASED_OUTPATIENT_CLINIC_OR_DEPARTMENT_OTHER): Payer: Self-pay | Admitting: Family

## 2024-02-06 ENCOUNTER — Ambulatory Visit (HOSPITAL_BASED_OUTPATIENT_CLINIC_OR_DEPARTMENT_OTHER): Payer: Self-pay | Admitting: Family

## 2024-02-06 ENCOUNTER — Other Ambulatory Visit: Payer: Self-pay

## 2024-02-06 ENCOUNTER — Ambulatory Visit (INDEPENDENT_AMBULATORY_CARE_PROVIDER_SITE_OTHER): Admitting: Family

## 2024-02-06 ENCOUNTER — Other Ambulatory Visit (HOSPITAL_BASED_OUTPATIENT_CLINIC_OR_DEPARTMENT_OTHER): Payer: Self-pay

## 2024-02-06 VITALS — BP 106/66 | HR 106 | Ht 72.0 in | Wt 250.0 lb

## 2024-02-06 DIAGNOSIS — E782 Mixed hyperlipidemia: Secondary | ICD-10-CM

## 2024-02-06 DIAGNOSIS — Z952 Presence of prosthetic heart valve: Secondary | ICD-10-CM

## 2024-02-06 DIAGNOSIS — I10 Essential (primary) hypertension: Secondary | ICD-10-CM | POA: Diagnosis not present

## 2024-02-06 DIAGNOSIS — Z01810 Encounter for preprocedural cardiovascular examination: Secondary | ICD-10-CM | POA: Diagnosis not present

## 2024-02-06 DIAGNOSIS — D6859 Other primary thrombophilia: Secondary | ICD-10-CM

## 2024-02-06 DIAGNOSIS — I5032 Chronic diastolic (congestive) heart failure: Secondary | ICD-10-CM

## 2024-02-06 DIAGNOSIS — I480 Paroxysmal atrial fibrillation: Secondary | ICD-10-CM

## 2024-02-06 MED ORDER — FUROSEMIDE 20 MG PO TABS
20.0000 mg | ORAL_TABLET | Freq: Every day | ORAL | 1 refills | Status: AC
  Filled 2024-02-06: qty 135, 90d supply, fill #0

## 2024-02-06 NOTE — Progress Notes (Signed)
 Cardiology Office Note:  .   Date:  02/06/2024  ID:  Jeff Johnson Aug 27, 1945, MRN [id number] PCP: [place], Quintin PARAS, MD  [facility] Providers Cardiologist:  Shelda Bruckner, MD Cardiology APP:  Vannie Reche RAMAN, NP  Electrophysiologist:  OLE ONEIDA HOLTS, MD    History of Present Illness: .   Jeff Johnson is a 78 y.o. male with hx of HTN, HLD, aortic valve disease s/p Bentall procedure, symptomatic bradycardia s/p PPM.  Previous patient of Dr. Alveta having since established with Dr. Bruckner.   Last seen 03/28/2023 for exertional dyspnea.  Lasix  changed to 20 mg daily as he was euvolemic.  Weight was down 10 to 15 pounds after twice daily dosing for a week.  However via subsequent phone calls it was increased to 20 mg twice daily as he had difficulty urinating and increased awake.  Visit 05/09/2023 Farxiga  10mg  daily added with repeat BMP/BNP in 1 week with stable kidney function and BNP improved from 146 to 110.   06/01/23 seen by Daphne Barrack, NP for device check with normal PPM function. One 6 second episode of AT recommended for monitoring.   He saw AFib clinic 06/14/23 with atrial fib with RVR since 3/30 per device clinic report. Symptomatic with SOB and fatigue. follow up to discuss cardioversion scheduled. Eliquis  initiated for OAC. He underwent atrial fibrillation ablation 08/29/23 with Dr. Holts.  Echo TEE 08/29/23 as part of ablation workup: LVEF 40-45%, severe LAW, RVSF mildly reduced, small pericardial effusion, mild MR, aortic valve prosthesis functioning well, mild dilation of 41mm.  Seen 01/06/24 by Dr. Bruckner. There was confusion on whether he was taking Entresto  and Losartan . There were episodes of hypotension at home. His Entresto  was changed to Losartan  and later adjusted to half tablet (12.5mg ) daily.  Echo 02/02/24 LVEF 55 to 60%, no RWMA, mild LVH, RV SF mildly reduced, RV mildly enlarged, mild to moderate left atrial enlargement, AV mean  gradient 9 mmHg, aortic root 45mm.   Presents today for follow up. He is pending TIkosyn load 02/14/24. Of note, also has tooth extraction of single tooth on 02/08/24. Reports more trouble with being tired. Having more trouble keeping weight down with persistently holding onto fluid. This does improve with extra PRN Lasix  20mg . Notes exertional dyspnea and orthopnea. His Smart Watch is noting more frequent episodes of atrial fibrillation. Discussed these are likely related. 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 Enjoys spending time with family including his great granddaughter who is almost 98 years old.   ROS: Please see the history of present illness.    All other systems reviewed and are negative.   Studies Reviewed: .        Cardiac Studies & Procedures   ______________________________________________________________________________________________   STRESS TESTS  MYOCARDIAL PERFUSION IMAGING 10/20/2016  Interpretation Summary  Nuclear stress EF: 58%.  There was no ST segment deviation noted during stress.  The study is normal.  This is a low risk study.  The left ventricular ejection fraction is normal (55-65%).   ECHOCARDIOGRAM  ECHOCARDIOGRAM COMPLETE 02/02/2024  Narrative ECHOCARDIOGRAM REPORT    Patient Name:   Jeff Johnson Date of Exam: 02/02/2024 Medical Rec #:  [id number]     Height:       72.0 in Accession #:    [id number]    Weight:       251.0 lb Date of Birth:  [birth year]     BSA:  2.346 m Patient Age:    78 years      BP:           110/60 mmHg Patient Gender: M             HR:           88 bpm. Exam Location:  Outpatient  Procedure: 2D Echo, Cardiac Doppler, Color Doppler and Intracardiac Opacification Agent (Both Spectral and Color Flow Doppler were utilized during procedure).  Indications:    Cardiomyopathy  History:        Patient has prior history of Echocardiogram examinations, most recent 08/29/2023. Arrythmias:Atrial  Fibrillation and Bradycardia; Risk Factors:Hypertension, Dyslipidemia and Former Smoker. Severe aortic stenosis s/p pericardial AVR, Bentall in 2011; Aneursym of aorta ; Prostate cancer.  Aortic Valve: 27 mm Magna bioprosthetic valve is present in the aortic position. Procedure Date: 2011.  Sonographer:    Orvil Holmes RDCS Referring Phys: [id number] BRIDGETTE CHRISTOPHER  IMPRESSIONS   1. Left ventricular ejection fraction, by estimation, is 55 to 60%. Left ventricular ejection fraction by 3D volume is 56 %. The left ventricle has normal function. The left ventricle has no regional wall motion abnormalities. There is mild concentric left ventricular hypertrophy. Left ventricular diastolic function could not be evaluated. 2. Right ventricular systolic function is mildly reduced. The right ventricular size is mildly enlarged. There is normal pulmonary artery systolic pressure. The estimated right ventricular systolic pressure is 28.0 mmHg. 3. Left atrial size was mild to moderately dilated. 4. The mitral valve is normal in structure. No evidence of mitral valve regurgitation. No evidence of mitral stenosis. 5. The aortic valve has been repaired/replaced. Aortic valve regurgitation is not visualized. There is a 27 mm Magna bioprosthetic valve present in the aortic position. Procedure Date: 2011. Echo findings are consistent with normal structure and function of the aortic valve prosthesis. Aortic valve mean gradient measures 9.0 mmHg. Aortic valve Vmax measures 2.04 m/s. Aortic valve acceleration time measures 67 msec. 6. Aortic dilatation noted and root/ascending aorta has been repaired/replaced. There is moderate dilatation of the aortic root, measuring 45 mm. 7. The inferior vena cava is normal in size with greater than 50% respiratory variability, suggesting right atrial pressure of 3 mmHg.  Comparison(s): A prior study was performed on 02/22/2023. No significant change from prior study.  Prior images reviewed side by side. Previous transthoracic study was performed during normal sinus rhythm.  FINDINGS Left Ventricle: Left ventricular ejection fraction, by estimation, is 55 to 60%. Left ventricular ejection fraction by 3D volume is 56 %. The left ventricle has normal function. The left ventricle has no regional wall motion abnormalities. Definity  contrast agent was given IV to delineate the left ventricular endocardial borders. The left ventricular internal cavity size was normal in size. There is mild concentric left ventricular hypertrophy. Abnormal (paradoxical) septal motion consistent with post-operative status and abnormal (paradoxical) septal motion, consistent with RV pacemaker. Left ventricular diastolic function could not be evaluated due to atrial fibrillation. Left ventricular diastolic function could not be evaluated.  Right Ventricle: The right ventricular size is mildly enlarged. No increase in right ventricular wall thickness. Right ventricular systolic function is mildly reduced. There is normal pulmonary artery systolic pressure. The tricuspid regurgitant velocity is 2.50 m/s, and with an assumed right atrial pressure of 3 mmHg, the estimated right ventricular systolic pressure is 28.0 mmHg.  Left Atrium: Left atrial size was mild to moderately dilated.  Right Atrium: Right atrial size was normal in size.  Pericardium: There  is no evidence of pericardial effusion.  Mitral Valve: The mitral valve is normal in structure. Mild mitral annular calcification. No evidence of mitral valve regurgitation. No evidence of mitral valve stenosis.  Tricuspid Valve: The tricuspid valve is normal in structure. Tricuspid valve regurgitation is mild.  Aortic Valve: The aortic valve has been repaired/replaced. Aortic valve regurgitation is not visualized. Aortic valve mean gradient measures 9.0 mmHg. Aortic valve peak gradient measures 16.6 mmHg. There is a 27 mm Magna  bioprosthetic valve present in the aortic position. Procedure Date: 2011. Echo findings are consistent with normal structure and function of the aortic valve prosthesis.  Pulmonic Valve: The pulmonic valve was not well visualized. Pulmonic valve regurgitation is mild.  Aorta: Aortic dilatation noted and the aortic root/ascending aorta has been repaired/replaced. There is moderate dilatation of the aortic root, measuring 45 mm.  Venous: The inferior vena cava is normal in size with greater than 50% respiratory variability, suggesting right atrial pressure of 3 mmHg.  IAS/Shunts: The interatrial septum was not well visualized.  Additional Comments: 3D was performed not requiring image post processing on an independent workstation and was normal. A device lead is visualized in the right ventricle and right atrium.   LEFT VENTRICLE PLAX 2D LVIDd:         4.02 cm         Diastology LVIDs:         2.54 cm         LV e' medial:    9.36 cm/s LV PW:         1.36 cm         LV E/e' medial:  12.7 LV IVS:        1.50 cm         LV e' lateral:   10.20 cm/s LV E/e' lateral: 11.7  3D Volume EF LV 3D EF:    Left ventricul ar ejection fraction by 3D volume is 56 %.  3D Volume EF: 3D EF:        56 %  RIGHT VENTRICLE RV Basal diam:  4.22 cm RV Mid diam:    2.78 cm RV S prime:     8.70 cm/s TAPSE (M-mode): 1.0 cm  LEFT ATRIUM             Index        RIGHT ATRIUM           Index LA diam:        3.70 cm 1.58 cm/m   RA Area:     16.20 cm LA Vol (A2C):   78.4 ml 33.41 ml/m  RA Volume:   44.30 ml  18.88 ml/m LA Vol (A4C):   58.8 ml 25.06 ml/m LA Biplane Vol: 69.6 ml 29.66 ml/m AORTIC VALVE AV Vmax:           204.00 cm/s AV Vmean:          137.000 cm/s AV VTI:            0.346 m AV Peak Grad:      16.6 mmHg AV Mean Grad:      9.0 mmHg LVOT Vmax:         78.20 cm/s LVOT Vmean:        52.300 cm/s LVOT VTI:          0.156 m LVOT/AV VTI ratio: 0.45  AORTA Ao Root diam: 4.50 cm Ao  Asc diam:  3.70 cm  MITRAL VALVE  TRICUSPID VALVE MV Area (PHT): 3.72 cm     TR Peak grad:   25.0 mmHg MV Decel Time: 204 msec     TR Vmax:        250.00 cm/s MV E velocity: 119.00 cm/s MV A velocity: 55.10 cm/s   SHUNTS MV E/A ratio:  2.16         Systemic VTI: 0.16 m  Jerel Croitoru MD Electronically signed by Jerel Balding MD Signature Date/Time: 02/02/2024/3:59:36 PM    Final   TEE  ECHO TEE 08/29/2023  Narrative TRANSESOPHOGEAL ECHO REPORT    Patient Name:   Lavonta Tillis Date of Exam: 08/29/2023 Medical Rec #:  [id number]   Height:       72.0 in Accession #:    [id number]  Weight:       230.0 lb Date of Birth:  July 12, 1945   BSA:          2.261 m Patient Age:    78 years    BP:           159/91 mmHg Patient Gender: M           HR:           88 bpm. Exam Location:  Inpatient  Procedure: Transesophageal Echo, Color Doppler and Cardiac Doppler (Both Spectral and Color Flow Doppler were utilized during procedure).  Indications:     I48.0 Paroxysmal atrial fibrillation  History:         Patient has prior history of Echocardiogram examinations, most recent 02/22/2023. Pacemaker, Arrythmias:Atrial Fibrillation; Risk Factors:Dyslipidemia and Hypertension. Aortic Valve: bioprosthetic valve is present in the aortic position. Procedure Date: 11/2009.  Sonographer:     Damien Senior RDCS Referring Phys:  OLE ONEIDA HOLTS Diagnosing Phys: Toribio Fuel MD   Sonographer Comments: Cath TEE prior to ablation   PROCEDURE: After discussion of the risks and benefits of a TEE, an informed consent was obtained from the patient. The transesophogeal probe was passed without difficulty through the esophogus of the patient. Sedation performed by different physician. The patient was monitored while under deep sedation. The patient developed no complications during the procedure.  IMPRESSIONS   1. Left ventricular ejection fraction, by estimation, is 40 to 45%. The  left ventricle has mildly decreased function. The left ventricle demonstrates regional wall motion abnormalities (see scoring diagram/findings for description). 2. Right ventricular systolic function is mildly reduced. The right ventricular size is normal. 3. Left atrial size was severely dilated. No left atrial/left atrial appendage thrombus was detected. 4. A small pericardial effusion is present. The pericardial effusion is anterior to the right ventricle and surrounding the apex. There is no evidence of cardiac tamponade. 5. The mitral valve is normal in structure. Mild mitral valve regurgitation. 6. The aortic valve has been repaired/replaced. Aortic valve regurgitation is not visualized. Aortic valve sclerosis/calcification is present, without any evidence of aortic stenosis. There is a bioprosthetic valve present in the aortic position. Procedure Date: 11/2009. Echo findings are consistent with normal structure and function of the aortic valve prosthesis. Aortic valve mean gradient measures 10.0 mmHg. Aortic valve Vmax measures 1.95 m/s. 7. Aortic dilatation noted. There is mild dilatation of the aortic root, measuring 41 mm. There is Moderate (Grade III) layered plaque involving the descending aorta.  FINDINGS Left Ventricle: Left ventricular ejection fraction, by estimation, is 40 to 45%. The left ventricle has mildly decreased function. The left ventricle demonstrates regional wall motion abnormalities. The left ventricular internal cavity size was normal in  size.   LV Wall Scoring: The inferior septum is hypokinetic.  Right Ventricle: The right ventricular size is normal. No increase in right ventricular wall thickness. Right ventricular systolic function is mildly reduced.  Left Atrium: Left atrial size was severely dilated. No left atrial/left atrial appendage thrombus was detected.  Right Atrium: Right atrial size was normal in size.  Pericardium: A small pericardial effusion is  present. The pericardial effusion is anterior to the right ventricle and surrounding the apex. There is no evidence of cardiac tamponade.  Mitral Valve: The mitral valve is normal in structure. Mild mitral valve regurgitation.  Tricuspid Valve: The tricuspid valve is normal in structure. Tricuspid valve regurgitation is mild.  Aortic Valve: The aortic valve has been repaired/replaced. Aortic valve regurgitation is not visualized. Aortic valve sclerosis/calcification is present, without any evidence of aortic stenosis. Aortic valve mean gradient measures 10.0 mmHg. Aortic valve peak gradient measures 15.2 mmHg. There is a bioprosthetic valve present in the aortic position. Procedure Date: 11/2009. Echo findings are consistent with normal structure and function of the aortic valve prosthesis.  Pulmonic Valve: The pulmonic valve was normal in structure. Pulmonic valve regurgitation is trivial.  Aorta: Aortic dilatation noted. There is mild dilatation of the aortic root, measuring 41 mm. There is moderate (Grade III) layered plaque involving the descending aorta.  IAS/Shunts: The interatrial septum appears to be lipomatous. No atrial level shunt detected by color flow Doppler.  Additional Comments: Spectral Doppler performed.  AORTIC VALVE AV Vmax:           195.00 cm/s AV Vmean:          156.000 cm/s AV VTI:            0.360 m AV Peak Grad:      15.2 mmHg AV Mean Grad:      10.0 mmHg LVOT Vmax:         51.20 cm/s LVOT Vmean:        38.300 cm/s LVOT VTI:          0.094 m LVOT/AV VTI ratio: 0.26   SHUNTS Systemic VTI: 0.09 m  Toribio Fuel MD Electronically signed by Toribio Fuel MD Signature Date/Time: 08/29/2023/11:01:08 AM    Final        ______________________________________________________________________________________________           Risk Assessment/Calculations:             Physical Exam:   VS:  BP 106/66 (BP Location: Left Arm, Patient Position:  Sitting, Cuff Size: Large)   Pulse (!) 106   Ht 6' (1.829 m)   Wt 250 lb (113.4 kg)   SpO2 97%   BMI 33.91 kg/m    Wt Readings from Last 3 Encounters:  02/06/24 250 lb (113.4 kg)  01/17/24 251 lb (113.9 kg)  01/06/24 251 lb 6.4 oz (114 kg)    GEN: Well nourished, well developed in no acute distress NECK: No JVD; No carotid bruits CARDIAC: IRIR, no murmurs, rubs, gallops RESPIRATORY:  Clear to auscultation without rales, wheezing or rhonchi  ABDOMEN: Soft, non-tender, non-distended EXTREMITIES:  No edema; No deformity   ASSESSMENT AND PLAN: .    Preop -pending having a single tooth pulled with Dr. Sanjuanita.  Does not need to hold anticoagulation.  He is aware to take amoxicillin  2 g 1 hour prior to dental work and has prescription. Per AHA/ACC guidelines, he is deemed acceptable risk for the planned procedure without additional cardiovascular testing. Will route to Dr. Nickolas so  he is aware. Confirmed with AFib clinic that okay to proceed as will remain on OAC.  DOE / Diastolic dysfunction / HFrEF / Aortic dilation - Echo 02/2023 LVEF 55-60%, gr2dd. Echo TEE 08/29/23 as part of ablation workup: LVEF 40-45%, severe LAW, RVSF mildly reduced, small pericardial effusion, mild MR, aortic valve prosthesis functioning well, mild dilation of 41mm. Echo 02/02/24 LVEF 55 to 60%, no RWMA, mild LVH, RV SF mildly reduced, RV mildly enlarged, mild to moderate left atrial enlargement, AV mean gradient 9 mmHg, aortic root 45mm.  GDMT includes Farxiga  10mg  daily, Losartan  25mg  daily, Lasix  20mg  daily with additional 20mg  PRN for weight gain of 2 lbs overnight or 5 lbs in one week.  Low sodium diet, fluid restriction <2L, and daily weights encouraged. Educated to contact our office for weight gain of 2 lbs overnight or 5 lbs in one week.  01/2024 aortic root 45mm. Continue optimal BP control. Avoid fluoroquinolones.   HTN - Previous episodes of hypotension have resolved. Monitoring BP at home routinely.   S/p  AVR with aortic root by repair - Echo 02/2023 with normal structure and function of aortic valve prosthesis. TEE 08/29/23 with normally functioning prosthesis. Normal function by TEE 08/2023 and echo 01/2024. Continue SBE prophylaxis.  HLD - Continue Simvastatin  20mg  daily.   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        Dispo: follow up in 2 months  Signed, Reche GORMAN Finder, NP

## 2024-02-06 NOTE — Patient Instructions (Addendum)
 Medication Instructions:   Be sure to take your Amoxicillin  prior to your dental work 02/08/24.   You DO NOT need to hold your Eliquis  before having your tooth pulled.  It is okay to take Farxiga  with food if it helps you not feel nauseous.  Today and tomorrow if you are having significant shortness of breath, please take your extra Lasix  tablet.   *If you need a refill on your cardiac medications before your next appointment, please call your pharmacy*  Follow-Up: At [facility], you and your health needs are our priority.  As part of our continuing mission to provide you with exceptional heart care, our providers are all part of one team.  This team includes your primary Cardiologist (physician) and Advanced Practice Providers or APPs (Physician Assistants and Nurse Practitioners) who all work together to provide you with the care you need, when you need it.  Your next appointment:   2 month(s)  Provider:   Shelda Bruckner, MD or Reche Finder, NP    We recommend signing up for the patient portal called MyChart.  Sign up information is provided on this After Visit Summary.  MyChart is used to connect with patients for Virtual Visits (Telemedicine).  Patients are able to view lab/test results, encounter notes, upcoming appointments, etc.  Non-urgent messages can be sent to your provider as well.   To learn more about what you can do with MyChart, go to [email or web address].   Other Instructions  The goal of getting your atrial fibrillation back into normal rhythm with Tikosyn is to help your heart muscle work more effectively. As your heart works more effectively, the hope is you will not hold onto as much fluid and your breathing is improved.

## 2024-02-08 ENCOUNTER — Encounter (HOSPITAL_COMMUNITY): Payer: Self-pay

## 2024-02-08 DIAGNOSIS — K080 Exfoliation of teeth due to systemic causes: Secondary | ICD-10-CM | POA: Diagnosis not present

## 2024-02-10 ENCOUNTER — Other Ambulatory Visit (HOSPITAL_BASED_OUTPATIENT_CLINIC_OR_DEPARTMENT_OTHER): Payer: Self-pay

## 2024-02-14 ENCOUNTER — Ambulatory Visit (HOSPITAL_COMMUNITY)
Admission: RE | Admit: 2024-02-14 | Discharge: 2024-02-14 | Disposition: A | Source: Ambulatory Visit | Attending: Internal Medicine | Admitting: Internal Medicine

## 2024-02-14 ENCOUNTER — Inpatient Hospital Stay (HOSPITAL_COMMUNITY)
Admission: AD | Admit: 2024-02-14 | Discharge: 2024-02-17 | DRG: 309 | Disposition: A | Source: Ambulatory Visit | Attending: Cardiology | Admitting: Cardiology

## 2024-02-14 ENCOUNTER — Ambulatory Visit (HOSPITAL_COMMUNITY): Payer: Self-pay | Admitting: Internal Medicine

## 2024-02-14 ENCOUNTER — Encounter (HOSPITAL_COMMUNITY): Payer: Self-pay | Admitting: Internal Medicine

## 2024-02-14 VITALS — BP 114/70 | HR 91 | Ht 72.0 in | Wt 251.8 lb

## 2024-02-14 DIAGNOSIS — I714 Abdominal aortic aneurysm, without rupture, unspecified: Secondary | ICD-10-CM | POA: Diagnosis not present

## 2024-02-14 DIAGNOSIS — Z823 Family history of stroke: Secondary | ICD-10-CM | POA: Diagnosis not present

## 2024-02-14 DIAGNOSIS — Z8546 Personal history of malignant neoplasm of prostate: Secondary | ICD-10-CM | POA: Diagnosis not present

## 2024-02-14 DIAGNOSIS — I10 Essential (primary) hypertension: Secondary | ICD-10-CM | POA: Diagnosis not present

## 2024-02-14 DIAGNOSIS — R001 Bradycardia, unspecified: Secondary | ICD-10-CM | POA: Diagnosis not present

## 2024-02-14 DIAGNOSIS — Z950 Presence of cardiac pacemaker: Secondary | ICD-10-CM | POA: Diagnosis not present

## 2024-02-14 DIAGNOSIS — I5022 Chronic systolic (congestive) heart failure: Secondary | ICD-10-CM | POA: Diagnosis not present

## 2024-02-14 DIAGNOSIS — I110 Hypertensive heart disease with heart failure: Secondary | ICD-10-CM | POA: Diagnosis not present

## 2024-02-14 DIAGNOSIS — K219 Gastro-esophageal reflux disease without esophagitis: Secondary | ICD-10-CM | POA: Diagnosis not present

## 2024-02-14 DIAGNOSIS — Z96642 Presence of left artificial hip joint: Secondary | ICD-10-CM | POA: Diagnosis not present

## 2024-02-14 DIAGNOSIS — Z7901 Long term (current) use of anticoagulants: Secondary | ICD-10-CM | POA: Diagnosis not present

## 2024-02-14 DIAGNOSIS — Z8679 Personal history of other diseases of the circulatory system: Secondary | ICD-10-CM | POA: Diagnosis not present

## 2024-02-14 DIAGNOSIS — D6869 Other thrombophilia: Secondary | ICD-10-CM

## 2024-02-14 DIAGNOSIS — I480 Paroxysmal atrial fibrillation: Secondary | ICD-10-CM | POA: Diagnosis not present

## 2024-02-14 DIAGNOSIS — I4819 Other persistent atrial fibrillation: Secondary | ICD-10-CM | POA: Diagnosis not present

## 2024-02-14 DIAGNOSIS — N400 Enlarged prostate without lower urinary tract symptoms: Secondary | ICD-10-CM | POA: Diagnosis not present

## 2024-02-14 DIAGNOSIS — Z923 Personal history of irradiation: Secondary | ICD-10-CM | POA: Diagnosis not present

## 2024-02-14 DIAGNOSIS — Z953 Presence of xenogenic heart valve: Secondary | ICD-10-CM | POA: Diagnosis not present

## 2024-02-14 DIAGNOSIS — E785 Hyperlipidemia, unspecified: Secondary | ICD-10-CM

## 2024-02-14 DIAGNOSIS — Z91013 Allergy to seafood: Secondary | ICD-10-CM | POA: Diagnosis not present

## 2024-02-14 DIAGNOSIS — Z79899 Other long term (current) drug therapy: Secondary | ICD-10-CM | POA: Diagnosis not present

## 2024-02-14 DIAGNOSIS — I428 Other cardiomyopathies: Secondary | ICD-10-CM | POA: Diagnosis not present

## 2024-02-14 DIAGNOSIS — Z888 Allergy status to other drugs, medicaments and biological substances status: Secondary | ICD-10-CM | POA: Diagnosis not present

## 2024-02-14 DIAGNOSIS — Z91041 Radiographic dye allergy status: Secondary | ICD-10-CM | POA: Diagnosis not present

## 2024-02-14 DIAGNOSIS — Z8249 Family history of ischemic heart disease and other diseases of the circulatory system: Secondary | ICD-10-CM | POA: Diagnosis not present

## 2024-02-14 DIAGNOSIS — Z9049 Acquired absence of other specified parts of digestive tract: Secondary | ICD-10-CM | POA: Diagnosis not present

## 2024-02-14 DIAGNOSIS — Z800 Family history of malignant neoplasm of digestive organs: Secondary | ICD-10-CM | POA: Diagnosis not present

## 2024-02-14 DIAGNOSIS — I4892 Unspecified atrial flutter: Secondary | ICD-10-CM | POA: Diagnosis not present

## 2024-02-14 LAB — BASIC METABOLIC PANEL WITH GFR
Anion gap: 5 (ref 5–15)
BUN/Creatinine Ratio: 14 (ref 10–24)
BUN: 15 mg/dL (ref 8–27)
BUN: 13 mg/dL (ref 8–23)
CO2: 27 mmol/L (ref 20–29)
CO2: 22 mmol/L (ref 22–32)
Calcium: 9.3 mg/dL (ref 8.6–10.2)
Calcium: 8.4 mg/dL — ABNORMAL LOW (ref 8.9–10.3)
Chloride: 103 mmol/L (ref 96–106)
Chloride: 108 mmol/L (ref 98–111)
Creatinine, Ser: 1.08 mg/dL (ref 0.76–1.27)
Creatinine, Ser: 1.08 mg/dL (ref 0.61–1.24)
GFR, Estimated: >60 mL/min (ref >60)
Glucose, Bld: 148 mg/dL — ABNORMAL HIGH (ref 70–99)
Glucose: 148 mg/dL — ABNORMAL HIGH (ref 70–99)
Potassium: 3.7 mmol/L (ref 3.5–5.2)
Potassium: 4.3 mmol/L (ref 3.5–5.1)
Sodium: 141 mmol/L (ref 134–144)
Sodium: 135 mmol/L (ref 135–145)
eGFR: 69 mL/min/1.73 (ref >59)

## 2024-02-14 LAB — MAGNESIUM: Magnesium: 1.8 mg/dL (ref 1.6–2.3)

## 2024-02-14 MED ORDER — ASPIRIN 81 MG PO TBEC
81.0000 mg | DELAYED_RELEASE_TABLET | Freq: Every day | ORAL | Status: DC
  Administered 2024-02-15 – 2024-02-17 (×3): 81 mg via ORAL
  Filled 2024-02-14 (×3): qty 1

## 2024-02-14 MED ORDER — PANTOPRAZOLE SODIUM 20 MG PO TBEC
20.0000 mg | DELAYED_RELEASE_TABLET | Freq: Every day | ORAL | Status: DC
  Administered 2024-02-15 – 2024-02-17 (×3): 20 mg via ORAL
  Filled 2024-02-14 (×3): qty 1

## 2024-02-14 MED ORDER — LOSARTAN POTASSIUM 25 MG PO TABS
25.0000 mg | ORAL_TABLET | Freq: Every day | ORAL | Status: DC
  Administered 2024-02-15: 25 mg via ORAL
  Filled 2024-02-14: qty 1

## 2024-02-14 MED ORDER — MAGNESIUM SULFATE 2 GM/50ML IV SOLN
2.0000 g | Freq: Once | INTRAVENOUS | Status: AC
  Administered 2024-02-14: 2 g via INTRAVENOUS
  Filled 2024-02-14: qty 50

## 2024-02-14 MED ORDER — DOFETILIDE 500 MCG PO CAPS
500.0000 ug | ORAL_CAPSULE | Freq: Two times a day (BID) | ORAL | Status: DC
  Administered 2024-02-14 – 2024-02-17 (×6): 500 ug via ORAL
  Filled 2024-02-14 (×6): qty 1

## 2024-02-14 MED ORDER — TAMSULOSIN HCL 0.4 MG PO CAPS
0.4000 mg | ORAL_CAPSULE | Freq: Every day | ORAL | Status: DC
  Administered 2024-02-15 – 2024-02-17 (×3): 0.4 mg via ORAL
  Filled 2024-02-14 (×4): qty 1

## 2024-02-14 MED ORDER — MONTELUKAST SODIUM 10 MG PO TABS
10.0000 mg | ORAL_TABLET | Freq: Every day | ORAL | Status: DC
  Administered 2024-02-15 – 2024-02-17 (×3): 10 mg via ORAL
  Filled 2024-02-14 (×3): qty 1

## 2024-02-14 MED ORDER — APIXABAN 5 MG PO TABS
5.0000 mg | ORAL_TABLET | Freq: Two times a day (BID) | ORAL | Status: DC
  Administered 2024-02-14 – 2024-02-17 (×6): 5 mg via ORAL
  Filled 2024-02-14 (×6): qty 1

## 2024-02-14 MED ORDER — FUROSEMIDE 20 MG PO TABS
20.0000 mg | ORAL_TABLET | Freq: Every day | ORAL | Status: DC
  Administered 2024-02-15 – 2024-02-17 (×3): 20 mg via ORAL
  Filled 2024-02-14 (×3): qty 1

## 2024-02-14 MED ORDER — SIMVASTATIN 20 MG PO TABS
20.0000 mg | ORAL_TABLET | Freq: Every day | ORAL | Status: DC
  Administered 2024-02-14 – 2024-02-16 (×3): 20 mg via ORAL
  Filled 2024-02-14 (×3): qty 1

## 2024-02-14 MED ORDER — POTASSIUM CHLORIDE CRYS ER 20 MEQ PO TBCR
60.0000 meq | EXTENDED_RELEASE_TABLET | ORAL | Status: AC
  Administered 2024-02-14: 60 meq via ORAL
  Filled 2024-02-14: qty 3

## 2024-02-14 MED ORDER — SODIUM CHLORIDE 0.9% FLUSH: 3.0000 mL | INTRAVENOUS | Status: DC | PRN

## 2024-02-14 MED ORDER — ACETAMINOPHEN 500 MG PO TABS
1000.0000 mg | ORAL_TABLET | Freq: Three times a day (TID) | ORAL | Status: DC | PRN
  Administered 2024-02-15: 1000 mg via ORAL
  Filled 2024-02-14: qty 2

## 2024-02-14 MED ORDER — SODIUM CHLORIDE 0.9 % IV SOLN: 250.0000 mL | INTRAVENOUS | Status: AC | PRN

## 2024-02-14 MED ORDER — SODIUM CHLORIDE 0.9% FLUSH
3.0000 mL | Freq: Two times a day (BID) | INTRAVENOUS | Status: DC
  Administered 2024-02-14 – 2024-02-17 (×5): 3 mL via INTRAVENOUS

## 2024-02-14 NOTE — H&P (Addendum)
 Cardiology Admission History and Physical   Patient ID: DRAYLON MERCADEL MRN: [id number]; DOB: [birth year]   Admission date: 02/14/2024  PCP:  de [place], Raymond J, MD   [facility] Providers Cardiologist:  Shelda Bruckner, MD  Cardiology APP:  Vannie Reche RAMAN, NP  Electrophysiologist:  OLE ONEIDA HOLTS, MD      Chief Complaint:  Recurrent Afib  Patient Profile: Jeff Johnson is a 78 y.o. male with AAA, symptomatic bradycardia s/p PPM, prostate cancer, hypertension, hyperlipidemia, s/p AVR, chronic diastolic heart failure and recurrent atrial fibrillation who is being seen 02/14/2024 for the evaluation of atrial fibrillation with RVR.  History of Present Illness: Jeff Johnson is a 78 year old male with past medical history of AAA, symptomatic bradycardia s/p PPM, prostate cancer, hypertension, hyperlipidemia, s/p AVR, chronic diastolic heart failure and recurrent atrial fibrillation.  He underwent A-fib ablation in June 2025 by Dr. Holts.  He had recurrence of A-fib based on device interrogation.  A-fib has been persistent since 01/11/2024.  He has not missed any doses of Eliquis .  Recent echocardiogram obtained on 02/02/2024 showed EF 55 to 60%, mild LVH, RVSP 28 mmHg, mild to moderate LAE, stable bioprosthetic AVR.  He presented to the hospital today for Tikosyn loading.  QTc obtained in the clinic was 435 ms.  He was started on 500 mcg of Tikosyn twice a day.  Initial potassium was 3.7, patient has been given 60 mg oral potassium.  Magnesium 1.8, 1 dose of mag sulfate was ordered.   Past Medical History:  Diagnosis Date   Arthritis    Benign essential HTN 05/21/2014   BPH (benign prostatic hyperplasia)    Chronic cystitis    a. With ongoing hematuria.   Contrast media allergy    Diverticulitis    a. 2010: diverticulitis with stricture Sigmoid colectomy with mobilization of the splenic flexure within the end anastomosis.    Dizziness and giddiness    a. H/o dizziness  with reportedly negative tilt table. Holter 2012: NSR, sinus tachy, frequent PVCs, multifocal at times in a trigeminal fashion.   Dyspnea on exertion 03/18/2014   Frequency of urination    GERD (gastroesophageal reflux disease)    H/O cardiac catheterization    a. 2011: minor luminal irregularities. b. Nuc 03/2014: ow risk without reversible ischemia or infarction, EF 57%. (There is septal akinesis but otherwise normal wall motion. This is nonspecific.)   Headache    MIGRAINES   Hematuria    History of urinary retention    Hyperlipidemia 03/18/2014   Hyponatremia    a. Remote hx hyponatremia felt 2/2 SSRI.   Nocturia    Obesity    Orthostatic hypotension    Prostate cancer (HCC) 2011   a. s/p radiation.   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 11/2009: pericardial AVR/[place].   SYNCOPE 02/18/2010   Qualifier: Diagnosis of  By: Waddell, MD, CODY Danelle Fallow    Thoracic aortic aneurysm    a. 11/2009: s/p Magna Ease pericardial tissue valve size 27mm and replacement of fusiform ascending thoracic aneurysm with 30-mm Hemashield cath with hemiarch reconstruction.   Past Surgical History:  Procedure Laterality Date   ATRIAL FIBRILLATION ABLATION N/A 08/29/2023   Procedure: ATRIAL FIBRILLATION ABLATION;  Surgeon: Holts Ole ONEIDA, MD;  Location: [facility] INVASIVE CV LAB;  Service: Cardiovascular;  Laterality: N/A;   BACK SURGERY  1985   CARPAL TUNNEL RELEASE  BIL WRISTS   CHOLECYSTECTOMY  2010   COLON SURGERY  2010   colon resection   COLONOSCOPY  2008   [facility]   COLONOSCOPY WITH PROPOFOL  N/A 09/02/2017   Procedure: COLONOSCOPY WITH PROPOFOL ;  Surgeon: Legrand Victory LITTIE DOUGLAS, MD;  Location: [facility] ENDOSCOPY;  Service: Gastroenterology;  Laterality: N/A;   doppler carotid  2012   DOPPLER ECHOCARDIOGRAPHY  2011   PACEMAKER IMPLANT N/A 02/24/2023   Procedure: PACEMAKER IMPLANT;  Surgeon: Kennyth Chew, MD;  Location: [facility] INVASIVE CV LAB;   Service: Cardiovascular;  Laterality: N/A;   POLYPECTOMY  09/02/2017   Procedure: POLYPECTOMY;  Surgeon: Legrand Victory LITTIE DOUGLAS, MD;  Location: [facility] ENDOSCOPY;  Service: Gastroenterology;;   PROSTATE SURGERY  2011   adenocarcinoma w/raddiation    ROTATOR CUFF REPAIR     X2 LEFT SHOULDER   THORACIC AORTIC ANEURYSM REPAIR  12/11/09   ASCENDING THORACIC AORTIC ANEURYSM REPAIR   TISSUE AORTIC VALVE REPLACEMENT  12/11/09   TOTAL HIP ARTHROPLASTY Left 04/17/2015   Procedure: LEFT TOTAL HIP ARTHROPLASTY ANTERIOR APPROACH;  Surgeon: Redell Shoals, MD;  Location: [facility] ORS;  Service: Orthopedics;  Laterality: Left;   TRANSESOPHAGEAL ECHOCARDIOGRAM (CATH LAB) N/A 08/29/2023   Procedure: TRANSESOPHAGEAL ECHOCARDIOGRAM;  Surgeon: Cindie Ole DASEN, MD;  Location: [facility] INVASIVE CV LAB;  Service: Cardiovascular;  Laterality: N/A;     Medications Prior to Admission: Prior to Admission medications   Medication Sig Start Date End Date Taking? Authorizing Provider  acetaminophen  (TYLENOL ) 650 MG CR tablet Take 1,300 mg by mouth 3 (three) times daily as needed for pain or fever.    [provider]  amoxicillin  (AMOXIL ) 500 MG capsule Take 4 capsules (2,000 mg total) by mouth 1 hour prior to dental procedure. 02/02/24     apixaban  (ELIQUIS ) 5 MG TABS tablet Take 1 tablet (5 mg total) by mouth 2 (two) times daily. 10/06/23   Terra Fairy PARAS, PA-C  ascorbic acid (VITAMIN C) 500 MG tablet Take 500 mg by mouth in the morning.    [provider]  ASPIRIN  81 PO Take 1 tablet by mouth in the morning.    [provider]  Cholecalciferol (VITAMIN D  PO) Take 5,000 Units by mouth in the morning.    [provider]  dapagliflozin  propanediol (FARXIGA ) 10 MG TABS tablet Take 1 tablet (10 mg total) by mouth daily before breakfast. 06/27/23   Walker, Caitlin S, NP  furosemide  (LASIX ) 20 MG tablet Take 1 tablet (20 mg total) by mouth daily. Take additional 20mg  as needed for weight gain of 2 lbs overnight  of 5 lbs in one week. 02/06/24 05/10/24  Vannie Reche RAMAN, NP  lansoprazole  (PREVACID ) 15 MG capsule Take 1 capsule (15 mg total) by mouth daily at 12 noon. 11/21/23   de [place], Raymond J, MD  losartan  (COZAAR ) 25 MG tablet Take 1 tablet (25 mg total) by mouth daily. 01/06/24   Lonni Slain, MD  montelukast  (SINGULAIR ) 10 MG tablet Take 1 tablet (10 mg total) by mouth daily. 11/15/23   de [place], Raymond J, MD  Multiple Vitamins-Minerals (PRESERVISION AREDS 2) CAPS Take 1 capsule by mouth in the morning and at bedtime.    [provider]  pantoprazole  (PROTONIX ) 40 MG tablet Take 1 tablet (40 mg total) by mouth daily. (Pause lansoprazole ) 08/30/23 02/14/24  Lesia Ozell Barter, PA-C  simvastatin  (ZOCOR ) 20 MG tablet Take 1 tablet (20 mg total) by mouth daily. 11/15/23   de [place], Raymond J, MD  tamsulosin  (FLOMAX ) 0.4 MG  CAPS capsule Take 1 capsule (0.4 mg total) by mouth daily. 10/24/23   de [place], Raymond J, MD  zinc gluconate 50 MG tablet Take 50-75 mg by mouth in the morning. Patient takes 50/75 daily    [provider]     Allergies:    Allergies  Allergen Reactions   Beta Adrenergic Blockers Other (See Comments)    Remote reportedh/o intolerance due to weakness per notes.SABRASABRASABRACalled patient. Advised patient of provider's approval for requested procedure, as well as any comments/instructions from provider.    Iodinated Contrast Media Anaphylaxis   Shellfish Allergy Anaphylaxis and Other (See Comments)   Shellfish Protein-Containing Drug Products Anaphylaxis    Social History:   Social History   Socioeconomic History   Marital status: Married    Spouse name: Not on file   Number of children: Not on file   Years of education: Not on file   Highest education level: Not on file  Occupational History   Not on file  Tobacco Use   Smoking status: Never    Passive exposure: Never   Smokeless tobacco: Never   Tobacco comments:    Never smoked 02/14/24   Vaping Use    Vaping status: Never Used  Substance and Sexual Activity   Alcohol  use: No   Drug use: No   Sexual activity: Not Currently    Partners: Female  Other Topics Concern   Not on file  Social History Narrative   Not on file   Social Drivers of Health   Financial Resource Strain: Low Risk  (09/06/2023)   Overall Financial Resource Strain (CARDIA)    Difficulty of Paying Living Expenses: Not hard at all  Food Insecurity: No Food Insecurity (08/29/2023)   Hunger Vital Sign    Worried About Running Out of Food in the Last Year: Never true    Ran Out of Food in the Last Year: Never true  Transportation Needs: No Transportation Needs (09/06/2023)   PRAPARE - Administrator, Civil Service (Medical): No    Lack of Transportation (Non-Medical): No  Physical Activity: Inactive (08/10/2022)   Exercise Vital Sign    Days of Exercise per Week: 0 days    Minutes of Exercise per Session: 0 min  Stress: No Stress Concern Present (09/06/2023)   [employer] of Occupational Health - Occupational Stress Questionnaire    Feeling of Stress: Not at all  Social Connections: Moderately Integrated (09/06/2023)   Social Connection and Isolation Panel    Frequency of Communication with Friends and Family: More than three times a week    Frequency of Social Gatherings with Friends and Family: More than three times a week    Attends Religious Services: Never    Database Administrator or Organizations: Yes    Attends Engineer, Structural: More than 4 times per year    Marital Status: Married  Catering Manager Violence: Not At Risk (08/29/2023)   Humiliation, Afraid, Rape, and Kick questionnaire    Fear of Current or Ex-Partner: No    Emotionally Abused: No    Physically Abused: No    Sexually Abused: No     Family History:   The patient's family history includes Colon cancer in his maternal aunt; Heart attack in his father; Heart disease (age of onset: 97) in his father; Stroke (age of  onset: 12) in his mother. There is no history of Stomach cancer or Esophageal cancer.    ROS:  Please see the  history of present illness.  All other ROS reviewed and negative.     Physical Exam/Data: Vitals:   02/14/24 1824  BP: 110/73  Pulse: 90  Resp: 18  Temp: 98.1 F (36.7 C)  TempSrc: Oral  SpO2: 98%   No intake or output data in the 24 hours ending 02/14/24 1903    02/14/2024    9:34 AM 02/06/2024    8:59 AM 01/17/2024    3:11 PM  Last 3 Weights  Weight (lbs) 251 lb 12.8 oz 250 lb 251 lb  Weight (kg) 114.216 kg 113.399 kg 113.853 kg     There is no height or weight on file to calculate BMI.  General:  Well nourished, well developed, in no acute distress HEENT: normal Neck: no JVD Vascular: No carotid bruits; Distal pulses 2+ bilaterally   Cardiac: Irregular; no murmur  Lungs:  clear to auscultation bilaterally, no wheezing, rhonchi or rales  Abd: soft, nontender, no hepatomegaly  Ext: no edema Musculoskeletal:  No deformities, BUE and BLE strength normal and equal Skin: warm and dry  Neuro:  CNs 2-12 intact, no focal abnormalities noted Psych:  Normal affect   EKG:  The ECG that was done and was personally reviewed and demonstrates atrial fibrillation, QTc 477  Relevant CV Studies:  Echo 02/02/2024  1. Left ventricular ejection fraction, by estimation, is 55 to 60%. Left  ventricular ejection fraction by 3D volume is 56 %. The left ventricle has  normal function. The left ventricle has no regional wall motion  abnormalities. There is mild concentric  left ventricular hypertrophy. Left ventricular diastolic function could  not be evaluated.   2. Right ventricular systolic function is mildly reduced. The right  ventricular size is mildly enlarged. There is normal pulmonary artery  systolic pressure. The estimated right ventricular systolic pressure is  28.0 mmHg.   3. Left atrial size was mild to moderately dilated.   4. The mitral valve is normal in  structure. No evidence of mitral valve  regurgitation. No evidence of mitral stenosis.   5. The aortic valve has been repaired/replaced. Aortic valve  regurgitation is not visualized. 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 Procedure Date: 2011. Echo findings  are consistent with normal structure and  function of the aortic valve prosthesis. Aortic valve mean gradient  measures 9.0 mmHg. Aortic valve Vmax measures 2.04 m/s. Aortic valve  acceleration time measures 67 msec.   6. Aortic dilatation noted and root/ascending aorta has been  repaired/replaced. There is moderate dilatation of the aortic root,  measuring 45 mm.   7. The inferior vena cava is normal in size with greater than 50%  respiratory variability, suggesting right atrial pressure of 3 mmHg.   Comparison(s): A prior study was performed on 02/22/2023. No significant  change from prior study. Prior images reviewed side by side. Previous  transthoracic study was performed during normal sinus rhythm.   Laboratory Data: High Sensitivity Troponin:  No results for input(s): TROPONINIHS in the last 720 hours.    Chemistry Recent Labs  Lab 02/14/24 0941  NA 141  K 3.7  CL 103  CO2 27  GLUCOSE 148*  BUN 15  CREATININE 1.08  CALCIUM 9.3  MG 1.8      Radiology/Studies:  No results found.   Assessment and Plan: Persistent atrial fibrillation: Recurrent atrial fibrillation despite A-fib ablation procedure on 08/29/2023.  Patient has been admitted for Tikosyn loading.  Will started on 500  mcg twice a day of Tikosyn.  He has been compliant with Eliquis .  Monitor QTc closely.  Continue on telemetry.  Given single dose of potassium supplement and IV mag sulfate to keep K >4.0 and Mag > 2.0  Symptomatic bradycardia s/p PPM  History of AVR: Stable on recent echocardiogram. Hypertension Hyperlipidemia monitor   Risk Assessment/Risk Scores:        CHA2DS2-VASc Score = 4   This  indicates a 4.8% annual risk of stroke. The patient's score is based upon: CHF History: 1 HTN History: 1 Diabetes History: 0 Stroke History: 0 Vascular Disease History: 0 Age Score: 2 Gender Score: 0    Code Status: Full Code  Severity of Illness: The appropriate patient status for this patient is INPATIENT. Inpatient status is judged to be reasonable and necessary in order to provide the required intensity of service to ensure the patient's safety. The patient's presenting symptoms, physical exam findings, and initial radiographic and laboratory data in the context of their chronic comorbidities is felt to place them at high risk for further clinical deterioration. Furthermore, it is not anticipated that the patient will be medically stable for discharge from the hospital within 2 midnights of admission.   * I certify that at the point of admission it is my clinical judgment that the patient will require inpatient hospital care spanning beyond 2 midnights from the point of admission due to high intensity of service, high risk for further deterioration and high frequency of surveillance required.*  For questions or updates, please contact [facility] Please consult [email or web address] for contact info under     Signed, Jeff Johnson, [place]  02/14/2024 7:03 PM   I have personally evaluated and examined the patient. The history, physical exam, and medical decision making documented below were performed independently and substantively by me. I have reviewed all relevant data, formulated the assessment and plan, and assumed responsibility for the management of this patient. My documentation reflects the substantive portion of the split/shared visit, in accordance with CMS and CPT guidelines.   I have personally performed the substantive portion of the medical decision making, including interpretation of diagnostic data, formulation of the management plan, and assessment of risks. In  summation:  Jeff Johnson is an 78 year old with atrial fibrillation and symptomatic bradycardia who presents for a planned Tikosyn load.  He has a history of atrial fibrillation with a rapid ventricular response and remains in persistent atrial fibrillation despite a previous ablation in 2025. He experiences shortness of breath and fatigue, which he attributes to atrial fibrillation. His heart function is relatively preserved with an ejection fraction of 55-60%. His QTc obtained in the clinic was 440 milliseconds, and he is being started on 500 micrograms of Tikosyn twice a day. His potassium level is 3.7, and he is receiving potassium supplementation. His magnesium level was noted to be 0.8.  He has a history of symptomatic bradycardia and is status post permanent pacemaker placement. He also has a history of an aortic valve replacement and hypertension, which is managed with losartan  25 mg. His hyperlipidemia is managed with simvastatin , and his benign prostatic hyperplasia is managed with tamsulosin . He takes a diuretic of 20 mg to maintain euvolemia. His SGLT2 inhibitor is now held for potential cardioversion if he remains in atrial fibrillation.  He showed me his prior letter that did not discuss stopping his SGLT2i.    He previously had issues with amiodarone . His current medications include Eliquis  5 mg and aspirin ,  although there is no strong indication for concomitant use. His creatinine level is 1.08, and his glucose level is 148. An EKG performed today shows atrial fibrillation with occasional pacing and a QTc under 500 milliseconds, specifically 440 milliseconds from the average RR interval for non-paced beats.  He is a retired engineer, drilling, former engineer, site, working three days a week.  Long term, his goal of care his to live until the age of 42, to be able to see his great-grandaughter play soccer and thrive.  Exam notable for  Gen: no distress Ears:  Dempsey Sign Cardiac: No  Rubs or Gallops, systolic Murmur, IRIR Respiratory: Clear to auscultation bilaterally, normal effort, normal  respiratory rate GI: Soft, nontender, non-distended  MS: No  edema;  moves all extremities Integument: Skin feels warm Neuro:  At time of evaluation, alert and oriented to person/place/time/situation  Psych: Normal affect, patient feels ok  In assessment and plan:   Persistent atrial fibrillation Despite previous ablation in 2025. Heart function is relatively preserved with an ejection fraction of 55-60%. QTc interval is 440 milliseconds. Shortness of breath and fatigue attributed to atrial fibrillation. No missed doses of anticoagulation. Previous issues with amiodarone . SGLT2 inhibitor held for potential cardioversion if still in atrial fibrillation at fatigue and loading. - Started Tikosyn 500 mg twice daily - Held Farxiga  - Monitor QTC closely as per protocol - Will consider discontinuing aspirin  at discharge - consented for possible DCCV on Friday (needs added on)  Symptomatic bradycardia status post permanent pacemaker - Symptomatic bradycardia managed with a permanent pacemaker. - we have discussed possible future AVJ ablation if does not convert with Tikosyn- he did well when he was in SR for the 6 weeks post ablation  Aortic valve disease status post prosthetic valve replacement Aortic valve disease managed with prosthetic valve replacement; no strong indication for ASA at DC  Essential hypertension Hypertension managed with losartan  25 mg and lasix   Hyperlipidemia Managed with simvastatin .  Full Code On DOAC EP to takeover in AM  Stanly Leavens, MD FASE [facility] Cardiologist [facility]  [address] [place], [address] [place], [place] [address] [phone]  8:07 PM

## 2024-02-14 NOTE — Progress Notes (Signed)
 Pharmacy: Dofetilide (Tikosyn) - Initial Consult Assessment and Electrolyte Replacement  Pharmacy consulted to assist in monitoring and replacing electrolytes in this 78 y.o. male admitted on (Not on file) undergoing dofetilide initiation. First dofetilide dose: 500mcg  Assessment:  Patient Exclusion Criteria: If any screening criteria checked as Yes, then  patient  should NOT receive dofetilide until criteria item is corrected.  If "Yes" please indicate correction plan.  YES  NO Patient  Exclusion Criteria Correction Plan/Comments   []   [x]   Baseline QTc interval is greater than or equal to 440 msec. IF above YES box checked dofetilide contraindicated unless patient has ICD; then may proceed if QTc 500-550 msec or with known ventricular conduction abnormalities may proceed with QTc 550-600 msec. QTc =      []   [x]   Patient is known or suspected to have a digoxin level greater than 2 ng/ml: No results found for: DIGOXIN     []   [x]   Creatinine clearance less than 20 ml/min (calculated using Cockcroft-Gault, actual body weight and serum creatinine): Estimated Creatinine Clearance: 71.1 mL/min (by C-G formula based on SCr of 1.08 mg/dL).     []   [x]  Patient has received drugs known to prolong the QT intervals within the last 48 hour (examples: phenothiazines, tricyclics or tetracyclic antidepressants, macrolides, 1st generation H-1 antihistamines (especially diphenhydramine), fluoroquinolones, azoles, ondansetron , metoclopramide , promethazine).   Updated information on QT prolonging agents is available to be searched on the following database:QT prolonging agents -If SSRI or antihistamine needed, preferred options are sertraline and loratadine respectively     []   [x]  Patient received a dose of a thiazide diuretic in the last 48 hours [including hydrochlorothiazide  (Oretic ) alone or in any combination including triamterene (Dyazide, Maxzide)].    []   [x]  Patient received a  medication known to increase dofetilide plasma concentrations prior to initial dofetilide dose:  Trimethoprim  (Primsol, Proloprim ) in the last 36 hours Verapamil (Calan, Verelan) in the last 36 hours or a sustained release dose in the last 72 hours Megestrol (Megace) in the last 5 days  Cimetidine (Tagamet) in the last 6 hours Ketoconazole (Nizoral) in the last 24 hours Itraconazole (Sporanox) in the last 48 hours  Prochlorperazine (Compazine) in the last 36 hours     []   [x]   Patient is known to have a history of torsades de pointes; congenital or acquired long QT syndromes.    []   [x]   Patient has received a Class 1 and Class 3 antiarrhythmic with less than 2 half-lives since last dose. (Disopyramide, Quinidine, Procainamide, Lidocaine , Mexiletine, Flecainide, Propafenone, Sotalol, Dronedarone)    []   [x]   Patient has received amiodarone  therapy in the past 3 months or amiodarone  level is greater than 0.3 ng/ml.    Labs:    Component Value Date/Time   K 3.7 02/14/2024 0941   MG 1.8 02/14/2024 0941     Plan: Select One Calculated CrCl  Dose q12h  [x]  > 60 ml/min 500 mcg  []  40-60 ml/min 250 mcg  []  20-40 ml/min 125 mcg   [x]   Physician selected initial dose within range recommended for patients level of renal function - will monitor for response.  []   Physician selected initial dose outside of range recommended for patients level of renal function - will discuss if the dose should be altered at this time.   Patient has been appropriately anticoagulated with Eliquis  5mg  BID.  Potassium: K 3.5-3.7:  Hold Tikosyn initiation and give KCl 60 mEq po x1 and repeat BMET  2hr after dose - repeat appropriate dose if K < 4    Magnesium: Mg 1.8-2: Give Mg 2 gm IV x1 to prevent Mg from dropping below 1.8 - do not need to recheck Mg. Appropriate to initiate Tikosyn   Thank you for allowing pharmacy to participate in this patient's care   Rocky Slade, PharmD, BCPS 02/14/2024   6:13 PM

## 2024-02-14 NOTE — Progress Notes (Addendum)
 Primary Care Physician: [place], Quintin PARAS, MD Primary Cardiologist: Shelda Bruckner, MD Electrophysiologist: OLE ONEIDA HOLTS, MD     Referring Physician: Devic clinic alert     Jeff Johnson is a 78 y.o. male with a history of HLD, AAA, AI s/p AVR, symptomatic bradycardia, Mobitz II AV block s/p PPM, prostate cancer, systolic HF, chronic diastolic heart failure, HTN, BPH, and atrial fibrillation who presents for consultation in the [facility]ibr[facility]. Device clinic alert on 3/31 for ongoing Afib with RVR since 3/30. Patient is on ASA 81 mg daily. He has a CHADS2VASC score of 4.  On evaluation today, he is currently in Afib. He feels possibly more SOB and tired than usual. He denies having any OSA due to frequent urination at night. He takes ASA 81 mg daily.    On follow up 07/05/23, he is currently in Afib. Patient notes via Apple watch his Afib has been paroxysmal since last office visit. He began Eliquis  5 mg BID in addition to ASA 81 mg daily. No missed doses of Eliquis .   On follow up 09/26/23, patient is currently in AV paced rhythm. S/p Afib ablation on 08/29/23 by Dr. Holts. No episodes of Afib since ablation. No chest pain or SOB. Leg sites healed without issue. No missed doses of anticoagulant.  On follow up 01/17/24, patient is currently in Afib. Device clinic alert on 10/31 showing ongoing Afib since 10/29 with not well-controlled HR. No missed doses of Eliquis .   On follow up 02/14/24, patient is here for Tikosyn admission. No new medications since last OV. No benadryl use. No missed doses of anticoagulant. He has not held his Farxiga  and took it today.   Today, he denies symptoms of orthopnea, PND, lower extremity edema, dizziness, presyncope, syncope, snoring, daytime somnolence, bleeding, or neurologic sequela. The patient is tolerating medications without difficulties and is otherwise without complaint today.    he has a BMI of Body mass index  is 34.15 kg/m.SABRA Filed Weights   02/14/24 0934  Weight: 114.2 kg     Current Outpatient Medications  Medication Sig Dispense Refill   acetaminophen  (TYLENOL ) 650 MG CR tablet Take 1,300 mg by mouth 3 (three) times daily as needed for pain or fever.     amoxicillin  (AMOXIL ) 500 MG capsule Take 4 capsules (2,000 mg total) by mouth 1 hour prior to dental procedure. 12 capsule 1   apixaban  (ELIQUIS ) 5 MG TABS tablet Take 1 tablet (5 mg total) by mouth 2 (two) times daily. 60 tablet 11   ascorbic acid (VITAMIN C) 500 MG tablet Take 500 mg by mouth in the morning.     ASPIRIN  81 PO Take 1 tablet by mouth in the morning.     Cholecalciferol (VITAMIN D  PO) Take 5,000 Units by mouth in the morning.     dapagliflozin  propanediol (FARXIGA ) 10 MG TABS tablet Take 1 tablet (10 mg total) by mouth daily before breakfast. 90 tablet 2   furosemide  (LASIX ) 20 MG tablet Take 1 tablet (20 mg total) by mouth daily. Take additional 20mg  as needed for weight gain of 2 lbs overnight of 5 lbs in one week. 135 tablet 1   lansoprazole  (PREVACID ) 15 MG capsule Take 1 capsule (15 mg total) by mouth daily at 12 noon. 30 capsule 3   losartan  (COZAAR ) 25 MG tablet Take 1 tablet (25 mg total) by mouth daily. 90 tablet 1   montelukast  (SINGULAIR ) 10 MG tablet Take 1 tablet (10  mg total) by mouth daily. 30 tablet 3   Multiple Vitamins-Minerals (PRESERVISION AREDS 2) CAPS Take 1 capsule by mouth in the morning and at bedtime.     pantoprazole  (PROTONIX ) 40 MG tablet Take 1 tablet (40 mg total) by mouth daily. (Pause lansoprazole ) 45 tablet 0   simvastatin  (ZOCOR ) 20 MG tablet Take 1 tablet (20 mg total) by mouth daily. 30 tablet 3   tamsulosin  (FLOMAX ) 0.4 MG CAPS capsule Take 1 capsule (0.4 mg total) by mouth daily. 30 capsule 6   zinc gluconate 50 MG tablet Take 50-75 mg by mouth in the morning. Patient takes 50/75 daily     No current facility-administered medications for this encounter.    Atrial Fibrillation  Management history:  Previous antiarrhythmic drugs: amiodarone  Previous cardioversions: none Previous ablations: 08/29/23 Anticoagulation history: Eliquis    ROS- All systems are reviewed and negative except as per the HPI above.  Physical Exam: BP 114/70   Pulse 91   Ht 6' (1.829 m)   Wt 114.2 kg   BMI 34.15 kg/m   GEN- The patient is well appearing, alert and oriented x 3 today.   Neck - no JVD or carotid bruit noted Lungs- Clear to ausculation bilaterally, normal work of breathing Heart- Irregular rate and rhythm, no murmurs, rubs or gallops, PMI not laterally displaced Extremities- no clubbing, cyanosis, or edema Skin - no rash or ecchymosis noted   EKG today demonstrates  EKG Interpretation Date/Time:  Tuesday February 14 2024 09:38:36 EST Ventricular Rate:  91 PR Interval:    QRS Duration:  96 QT Interval:  388 QTC Calculation: 477 R Axis:   -54  Text Interpretation: Atrial fibrillation with occasional AV dual-paced complexes Left axis deviation Inferior infarct , age undetermined Abnormal ECG When compared with ECG of 17-Jan-2024 15:14, PREVIOUS ECG IS PRESENT Confirmed by Terra Pac ([id number]) on 02/14/2024 10:31:18 AM    Echo 08/29/23 (TEE): 1. Left ventricular ejection fraction, by estimation, is 40 to 45%. The  left ventricle has mildly decreased function. The left ventricle  demonstrates regional wall motion abnormalities (see scoring  diagram/findings for description).   2. Right ventricular systolic function is mildly reduced. The right  ventricular size is normal.   3. Left atrial size was severely dilated. No left atrial/left atrial  appendage thrombus was detected.   4. A small pericardial effusion is present. The pericardial effusion is  anterior to the right ventricle and surrounding the apex. There is no  evidence of cardiac tamponade.   5. The mitral valve is normal in structure. Mild mitral valve  regurgitation.   6. The aortic valve has been  repaired/replaced. Aortic valve  regurgitation is not visualized. Aortic valve sclerosis/calcification is  present, without any evidence of aortic stenosis. 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  Procedure Date: 11/2009. Echo findings are consistent with normal  structure and function of the aortic valve prosthesis. Aortic valve mean  gradient measures 10.0 mmHg. Aortic valve Vmax measures 1.95 m/s.   7. Aortic dilatation noted. There is mild dilatation of the aortic root,  measuring 41 mm. There is Moderate (Grade III) layered plaque involving  the descending aorta.   ASSESSMENT & PLAN CHA2DS2-VASc Score = 4  The patient's score is based upon: CHF History: 1 HTN History: 1 Diabetes History: 0 Stroke History: 0 Vascular Disease History: 0 Age Score: 2 Gender Score: 0       ASSESSMENT AND PLAN: Persistent Atrial Fibrillation (ICD10:  I48.0) The  patient's CHA2DS2-VASc score is 4, indicating a 4.8% annual risk of stroke.   S/p Afib ablation on 08/29/23 by Dr. Cindie. Patient was previously on amiodarone  and he stopped it due to nausea, vomiting, and reflux.  Patient presents today for dofetilide admission. Continue Eliquis , states no missed doses in the last 3 weeks. No recent benadryl use. PharmD has screened medications. QTc in SR 435 ms. Labs currently pending. CrCl 78 mL/min from [facility] on 01/17/24.  Discussion with Dr. Inocencio regarding patient not holding his Farxiga  prior to Tikosyn admission.  After discussion, checked with anesthesia and okay to proceed for potential cardioversion if necessary.   Secondary Hypercoagulable State (ICD10:  D68.69) The patient is at significant risk for stroke/thromboembolism based upon his CHA2DS2-VASc Score of 4. Continue Apixaban  (Eliquis ).  No missed doses.   Patient will present to admissions when a bed is available.   Terra Pac, PA-C  Afib Clinic [facility] [address] [place], [place] [address] [phone]

## 2024-02-15 ENCOUNTER — Other Ambulatory Visit: Payer: Self-pay

## 2024-02-15 DIAGNOSIS — I4819 Other persistent atrial fibrillation: Secondary | ICD-10-CM | POA: Diagnosis not present

## 2024-02-15 LAB — BASIC METABOLIC PANEL WITH GFR
Anion gap: 6 (ref 5–15)
BUN: 13 mg/dL (ref 8–23)
CO2: 23 mmol/L (ref 22–32)
Calcium: 8.6 mg/dL — ABNORMAL LOW (ref 8.9–10.3)
Chloride: 108 mmol/L (ref 98–111)
Creatinine, Ser: 1.12 mg/dL (ref 0.61–1.24)
GFR, Estimated: >60 mL/min (ref >60)
Glucose, Bld: 112 mg/dL — ABNORMAL HIGH (ref 70–99)
Potassium: 4.3 mmol/L (ref 3.5–5.1)
Sodium: 137 mmol/L (ref 135–145)

## 2024-02-15 LAB — MAGNESIUM: Magnesium: 2.1 mg/dL (ref 1.7–2.4)

## 2024-02-15 MED ORDER — SACUBITRIL-VALSARTAN 24-26 MG PO TABS
1.0000 | ORAL_TABLET | Freq: Two times a day (BID) | ORAL | Status: DC
  Administered 2024-02-16 – 2024-02-17 (×3): 1 via ORAL
  Filled 2024-02-15 (×3): qty 1

## 2024-02-15 MED ORDER — SACUBITRIL-VALSARTAN 24-26 MG PO TABS: 1.0000 | ORAL_TABLET | Freq: Two times a day (BID) | ORAL | Status: DC

## 2024-02-15 MED ORDER — SODIUM CHLORIDE 0.9 % IV SOLN: INTRAVENOUS | Status: DC

## 2024-02-15 MED ORDER — FLUTICASONE PROPIONATE 50 MCG/ACT NA SUSP
1.0000 | Freq: Every day | NASAL | Status: DC
  Administered 2024-02-15 – 2024-02-17 (×3): 1 via NASAL
  Filled 2024-02-15: qty 16

## 2024-02-15 MED ORDER — ADULT MULTIVITAMIN W/MINERALS CH
1.0000 | ORAL_TABLET | Freq: Two times a day (BID) | ORAL | Status: DC
  Administered 2024-02-15 – 2024-02-17 (×5): 1 via ORAL
  Filled 2024-02-15 (×5): qty 1

## 2024-02-15 NOTE — Progress Notes (Signed)
 Tikosyn dose given at 2200 (d/t repeat labs prior to administration), EKG due at 2400, EKG not done till 0130. EKG shows AFIB HR 85 Qtc 468

## 2024-02-15 NOTE — Plan of Care (Signed)
  Problem: Education: Goal: Knowledge of disease or condition will improve Outcome: Progressing Goal: Understanding of medication regimen will improve Outcome: Progressing   Problem: Clinical Measurements: Goal: Ability to maintain clinical measurements within normal limits will improve Outcome: Progressing   Problem: Activity: Goal: Ability to tolerate increased activity will improve Outcome: Completed/Met   Problem: Health Behavior/Discharge Planning: Goal: Ability to safely manage health-related needs after discharge will improve Outcome: Completed/Met   Problem: Education: Goal: Knowledge of General Education information will improve Description: Including pain rating scale, medication(s)/side effects and non-pharmacologic comfort measures Outcome: Completed/Met   Problem: Health Behavior/Discharge Planning: Goal: Ability to manage health-related needs will improve Outcome: Completed/Met   Problem: Nutrition: Goal: Adequate nutrition will be maintained Outcome: Completed/Met   Problem: Elimination: Goal: Will not experience complications related to bowel motility Outcome: Completed/Met Goal: Will not experience complications related to urinary retention Outcome: Completed/Met   Problem: Pain Managment: Goal: General experience of comfort will improve and/or be controlled Outcome: Completed/Met   Problem: Skin Integrity: Goal: Risk for impaired skin integrity will decrease Outcome: Completed/Met

## 2024-02-15 NOTE — Progress Notes (Signed)
 Patient has ambulated multiple times (5+) in hallway with his cane independently.

## 2024-02-15 NOTE — Progress Notes (Addendum)
 Rounding Note   Patient Name: Jeff Johnson Date of Encounter: 02/15/2024  [facility] Cardiologist: Shelda Bruckner, MD   Subjective  Feels well, doing laps up/down hall without difficulty  Scheduled Meds:  apixaban   5 mg Oral BID   aspirin  EC  81 mg Oral Daily   dofetilide  500 mcg Oral BID   furosemide   20 mg Oral Daily   losartan   25 mg Oral Daily   montelukast   10 mg Oral Daily   pantoprazole   20 mg Oral Daily   simvastatin   20 mg Oral Daily   sodium chloride  flush  3 mL Intravenous Q12H   tamsulosin   0.4 mg Oral Daily   Continuous Infusions:  sodium chloride      PRN Meds: sodium chloride , acetaminophen , sodium chloride  flush   Vital Signs  Vitals:   02/14/24 1824 02/14/24 2028 02/15/24 0658 02/15/24 0700  BP: 110/73 118/83 119/82 119/82  Pulse: 90  82 89  Resp: 18 19 18 18   Temp: 98.1 F (36.7 C) 98 F (36.7 C) 97.9 F (36.6 C)   TempSrc: Oral Oral Oral   SpO2: 98% 99% 100% 100%    Intake/Output Summary (Last 24 hours) at 02/15/2024 0802 Last data filed at 02/15/2024 9668 Gross per 24 hour  Intake 410 ml  Output --  Net 410 ml      02/14/2024    9:34 AM 02/06/2024    8:59 AM 01/17/2024    3:11 PM  Last 3 Weights  Weight (lbs) 251 lb 12.8 oz 250 lb 251 lb  Weight (kg) 114.216 kg 113.399 kg 113.853 kg      Telemetry  AFib w/CVR, some RV pacing, some broief periods of SR and appropriate AV pacing, some A pacing in AFib likely undersensing his AFib, Aryn Kops follow this  - Personally Reviewed  ECG   AFib 85bpm, QTc - Personally Reviewed w/Dr. Inocencio  Physical Exam  GEN: No acute distress.   Neck: No JVD Cardiac: irreg-irreg, no murmurs, rubs, or gallops.  Respiratory: CTA b/l. GI: Soft, nontender, non-distended  MS: No edema; No deformity. Neuro:  Nonfocal  Psych: Normal affect   Labs High Sensitivity Troponin:  No results for input(s): TROPONINIHS in the last 720 hours.   Chemistry Recent Labs  Lab  02/14/24 0941 02/14/24 2120 02/15/24 0426  NA 141 135 137  K 3.7 4.3 4.3  CL 103 108 108  CO2 27 22 23   GLUCOSE 148* 148* 112*  BUN 15 13 13   CREATININE 1.08 1.08 1.12  CALCIUM 9.3 8.4* 8.6*  MG 1.8  --  2.1  GFRNONAA  --  >60 >60  ANIONGAP  --  5 6    Lipids No results for input(s): CHOL, TRIG, HDL, LABVLDL, LDLCALC, CHOLHDL in the last 168 hours.  HematologyNo results for input(s): WBC, RBC, HGB, HCT, MCV, MCH, MCHC, RDW, PLT in the last 168 hours. Thyroid  No results for input(s): TSH, FREET4 in the last 168 hours.  BNPNo results for input(s): BNP, PROBNP in the last 168 hours.  DDimer No results for input(s): DDIMER in the last 168 hours.   Radiology  No results found.  Cardiac Studies  Echo 08/29/23 (TEE): 1. Left ventricular ejection fraction, by estimation, is 40 to 45%. The  left ventricle has mildly decreased function. The left ventricle  demonstrates regional wall motion abnormalities (see scoring  diagram/findings for description).   2. Right ventricular systolic function is mildly reduced. The right  ventricular size is normal.  3. Left atrial size was severely dilated. No left atrial/left atrial  appendage thrombus was detected.   4. A small pericardial effusion is present. The pericardial effusion is  anterior to the right ventricle and surrounding the apex. There is no  evidence of cardiac tamponade.   5. The mitral valve is normal in structure. Mild mitral valve  regurgitation.   6. The aortic valve has been repaired/replaced. Aortic valve  regurgitation is not visualized. Aortic valve sclerosis/calcification is  present, without any evidence of aortic stenosis. 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  Procedure Date: 11/2009. Echo findings are consistent with normal  structure and function of the aortic valve prosthesis. Aortic valve mean  gradient measures 10.0 mmHg. Aortic valve Vmax  measures 1.95 m/s.   7. Aortic dilatation noted. There is mild dilatation of the aortic root,  measuring 41 mm. There is Moderate (Grade III) layered plaque involving  the descending aorta.   Patient Profile   78 y.o. 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 I have personally performed an evaluation on this patient.  My findings are as follows: 78 y.o. male feeling well.  No acute complaints at this time.  Remains in atrial fibrillation..  Data: EKG(s) and pertinent labs, studies, etc were personally reviewed and interpreted by me:  EKG, telemetry, labs Otherwise, I agree with data as outlined by the advanced practice provider.  Exam performed by me: Gen: No acute distress Neck: No JVD Cardiac: Irregular Lungs: Normal work of breathing Extremities: No edema  My Assessment and Plan: 1.  Persistent atrial fibrillation: Remains in atrial fibrillation.  Dofetilide load in progress.  QTc remained stable.  Romona Murdy likely plan for cardioversion tomorrow unless he converts with dofetilide.  2.  Secondary  hypercoagulable date: Continue with Eliquis   3.  Hypertension: Continue home medications  4.  Chronic systolic heart failure: Ejection fraction has recovered.  Does have some diastolic heart failure, though no volume overload at this time.  Signed,  Keira Bohlin Gladis Norton, MD  02/15/2024 9:39 AM

## 2024-02-15 NOTE — Progress Notes (Addendum)
 Post dose EKG is reviewed Appears AFlutter,  Manually neasure QT 380-426ms, QTc 463-443ms (Reviewed with Dr. Inocencio) Continue Tikosyn DCCV tomorrow if needed Charlies Arthur, PA-C

## 2024-02-15 NOTE — Progress Notes (Signed)
 Pharmacy: Dofetilide (Tikosyn) - Follow Up Assessment and Electrolyte Replacement  Pharmacy consulted to assist in monitoring and replacing electrolytes in this 78 y.o. male admitted on 02/14/2024 undergoing dofetilide initiation. First dofetilide dose: 500 mcg - 02/14/24 @2200   Labs:    Component Value Date/Time   K 4.3 02/15/2024 0426   MG 2.1 02/15/2024 0426     Plan: Potassium: K >/= 4: No additional supplementation needed  Magnesium: Mg > 2: No additional supplementation needed   Thank you for allowing pharmacy to participate in this patient's care   Maurilio Fila, PharmD Clinical Pharmacist 02/15/2024  7:04 AM

## 2024-02-16 ENCOUNTER — Encounter (HOSPITAL_COMMUNITY): Admission: AD | Disposition: A | Payer: Self-pay | Source: Ambulatory Visit | Attending: Cardiology

## 2024-02-16 ENCOUNTER — Encounter (HOSPITAL_COMMUNITY): Payer: Self-pay | Admitting: Anesthesiology

## 2024-02-16 ENCOUNTER — Encounter (HOSPITAL_COMMUNITY): Payer: Self-pay | Admitting: Cardiology

## 2024-02-16 DIAGNOSIS — I4819 Other persistent atrial fibrillation: Secondary | ICD-10-CM | POA: Diagnosis not present

## 2024-02-16 LAB — BASIC METABOLIC PANEL WITH GFR
Anion gap: 10 (ref 5–15)
BUN: 16 mg/dL (ref 8–23)
CO2: 25 mmol/L (ref 22–32)
Calcium: 9.5 mg/dL (ref 8.9–10.3)
Chloride: 102 mmol/L (ref 98–111)
Creatinine, Ser: 1.22 mg/dL (ref 0.61–1.24)
GFR, Estimated: 60 mL/min — ABNORMAL LOW (ref >60)
Glucose, Bld: 119 mg/dL — ABNORMAL HIGH (ref 70–99)
Potassium: 3.6 mmol/L (ref 3.5–5.1)
Sodium: 137 mmol/L (ref 135–145)

## 2024-02-16 LAB — MAGNESIUM: Magnesium: 2.1 mg/dL (ref 1.7–2.4)

## 2024-02-16 SURGERY — CARDIOVERSION (CATH LAB)
Anesthesia: General

## 2024-02-16 MED ORDER — POTASSIUM CHLORIDE CRYS ER 20 MEQ PO TBCR
20.0000 meq | EXTENDED_RELEASE_TABLET | Freq: Every day | ORAL | Status: DC
  Administered 2024-02-17: 20 meq via ORAL
  Filled 2024-02-16: qty 1

## 2024-02-16 MED ORDER — POTASSIUM CHLORIDE CRYS ER 20 MEQ PO TBCR
40.0000 meq | EXTENDED_RELEASE_TABLET | Freq: Once | ORAL | Status: AC
  Administered 2024-02-16: 40 meq via ORAL
  Filled 2024-02-16: qty 2

## 2024-02-16 NOTE — Plan of Care (Signed)
   Problem: Education: Goal: Knowledge of disease or condition will improve Outcome: Progressing

## 2024-02-16 NOTE — Plan of Care (Signed)
°  Problem: Education: °Goal: Knowledge of disease or condition will improve °Outcome: Progressing °Goal: Understanding of medication regimen will improve °Outcome: Progressing °Goal: Individualized Educational Video(s) °Outcome: Progressing °  °Problem: Cardiac: °Goal: Ability to achieve and maintain adequate cardiopulmonary perfusion will improve °Outcome: Progressing °  °

## 2024-02-16 NOTE — Progress Notes (Signed)
 Pharmacy: Dofetilide  (Tikosyn ) - Follow Up Assessment and Electrolyte Replacement  Pharmacy consulted to assist in monitoring and replacing electrolytes in this 78 y.o. male admitted on 02/14/2024 undergoing dofetilide  initiation. First dofetilide  dose: 12/2  Labs:    Component Value Date/Time   K 3.6 02/16/2024 0450   MG 2.1 02/16/2024 0450     Plan: Potassium: K 3.8-3.9:  Give KCl 40 mEq po x1  And start potassium 20meq daily with furosemide  Or consider spironolactone 25mg  every day with EF 40%  Magnesium : Mg > 2: No additional supplementation needed    Olam Chalk Pharm.D. CPP, BCPS Clinical Pharmacist [phone] 02/16/2024 10:15 AM

## 2024-02-16 NOTE — TOC CM/SW Note (Signed)
 Transition of Care [facility]) - Inpatient Brief Assessment   Patient Details  Name: Jeff Johnson MRN: [id number] Date of Birth: April 18, 1945  [facility] CM/SW Contact:    Sudie Erminio Deems, RN Phone Number: 02/16/2024, 1:53 PM   Clinical Narrative: Patient presented for Tikosyn Load. Inpatient Case Manager spoke with the patient regarding co pay cost. Patient is agreeable to cost and would like to have the initial Rx filled via [facility]. Patient states he uses [facility] for outpatient medications. No further needs identified at this time.   Transition of Care Asessment: Insurance and Status: Insurance coverage has been reviewed Patient has primary care physician: Yes Home environment has been reviewed: reviewed Prior level of function:: uses a cane with ambulation. Prior/Current Home Services: No current home services Social Drivers of Health Review: SDOH reviewed no interventions necessary Readmission risk has been reviewed: Yes Transition of care needs: no transition of care needs at this time

## 2024-02-16 NOTE — Progress Notes (Addendum)
 Rounding Note   Patient Name: Jeff Johnson Date of Encounter: 02/16/2024  [facility] Cardiologist: Shelda Bruckner, MD   Subjective  Feels well, observed this morning doing laps up/down hall without difficulty again  Scheduled Meds:  apixaban   5 mg Oral BID   aspirin  EC  81 mg Oral Daily   dofetilide   500 mcg Oral BID   fluticasone   1 spray Each Nare Daily   furosemide   20 mg Oral Daily   montelukast   10 mg Oral Daily   multivitamin with minerals  1 tablet Oral BID   pantoprazole   20 mg Oral Daily   [START ON 02/17/2024] potassium chloride   20 mEq Oral Daily   potassium chloride   40 mEq Oral Once   sacubitril -valsartan   1 tablet Oral BID   simvastatin   20 mg Oral Daily   sodium chloride  flush  3 mL Intravenous Q12H   tamsulosin   0.4 mg Oral Daily   Continuous Infusions:   PRN Meds: acetaminophen , sodium chloride  flush   Vital Signs  Vitals:   02/15/24 2043 02/15/24 2302 02/16/24 0548 02/16/24 0719  BP: 95/62 110/61 109/67 (!) 147/71  Pulse: 66 64 66 63  Resp: 18 16 18 14   Temp: 98.2 F (36.8 C) 98.1 F (36.7 C) 98 F (36.7 C) 98.2 F (36.8 C)  TempSrc: Oral Oral Oral Temporal  SpO2: 99%  99% 97%   No intake or output data in the 24 hours ending 02/16/24 0818     02/14/2024    9:34 AM 02/06/2024    8:59 AM 01/17/2024    3:11 PM  Last 3 Weights  Weight (lbs) 251 lb 12.8 oz 250 lb 251 lb  Weight (kg) 114.216 kg 113.399 kg 113.853 kg      Telemetry  AFib w/CVR >> SR/V pacing and AV pacing, 60's-80's  - Personally Reviewed  ECG   SR/V paced, manually measured QRS , QT , QTc - Personally Reviewed w/Dr. Inocencio  Physical Exam  GEN: No acute distress.   Neck: No JVD Cardiac: RRR, no murmurs, rubs, or gallops.  Respiratory: CTA b/l. GI: Soft, nontender, non-distended  MS: No edema; No deformity. Neuro:  Nonfocal  Psych: Normal affect   Labs High Sensitivity Troponin:  No results for input(s): TROPONINIHS in the  last 720 hours.   Chemistry Recent Labs  Lab 02/14/24 0941 02/14/24 2120 02/15/24 0426 02/16/24 0450  NA 141 135 137 137  K 3.7 4.3 4.3 3.6  CL 103 108 108 102  CO2 27 22 23 25   GLUCOSE 148* 148* 112* 119*  BUN 15 13 13 16   CREATININE 1.08 1.08 1.12 1.22  CALCIUM 9.3 8.4* 8.6* 9.5  MG 1.8  --  2.1 2.1  GFRNONAA  --  >60 >60 60*  ANIONGAP  --  5 6 10     Lipids No results for input(s): CHOL, TRIG, HDL, LABVLDL, LDLCALC, CHOLHDL in the last 168 hours.  HematologyNo results for input(s): WBC, RBC, HGB, HCT, MCV, MCH, MCHC, RDW, PLT in the last 168 hours. Thyroid  No results for input(s): TSH, FREET4 in the last 168 hours.  BNPNo results for input(s): BNP, PROBNP in the last 168 hours.  DDimer No results for input(s): DDIMER in the last 168 hours.   Radiology  No results found.  Cardiac Studies  Echo 08/29/23 (TEE): 1. Left ventricular ejection fraction, by estimation, is 40 to 45%. The  left ventricle has mildly decreased function. The left ventricle  demonstrates regional wall motion abnormalities (  see scoring  diagram/findings for description).   2. Right ventricular systolic function is mildly reduced. The right  ventricular size is normal.   3. Left atrial size was severely dilated. No left atrial/left atrial  appendage thrombus was detected.   4. A small pericardial effusion is present. The pericardial effusion is  anterior to the right ventricle and surrounding the apex. There is no  evidence of cardiac tamponade.   5. The mitral valve is normal in structure. Mild mitral valve  regurgitation.   6. The aortic valve has been repaired/replaced. Aortic valve  regurgitation is not visualized. Aortic valve sclerosis/calcification is  present, without any evidence of aortic stenosis. 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  Procedure Date: 11/2009. Echo findings are consistent with normal  structure and  function of the aortic valve prosthesis. Aortic valve mean  gradient measures 10.0 mmHg. Aortic valve Vmax measures 1.95 m/s.   7. Aortic dilatation noted. There is mild dilatation of the aortic root,  measuring 41 mm. There is Moderate (Grade III) layered plaque involving  the descending aorta.   Patient Profile   78 y.o. 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 I have personally performed an evaluation on this patient.  My findings are as follows: 78 y.o. male converted to sinus rhythm overnight.  Feeling well without issue..  Data: EKG(s) and pertinent labs, studies, etc were personally reviewed and interpreted by me:  EKG, telemetry, labs Otherwise, I agree with data as outlined by the advanced practice provider.  Exam performed by me: Gen: No acute distress Neck: No JVD Cardiac: Regular Lungs: Normal work of breathing Extremities: No edema  My Assessment and Plan: 1.  Persistent atrial fibrillation: On dofetilide load.  Keep he has converted to sinus rhythm.  QTc remained stable.  [place] Alms continue with current management.  Likely plan for discharge  tomorrow.  2.  Hypertension: Continue home medications 3.  Chronic systolic heart failure: Ejection fraction has recovered.  Continue home heart failure medications.   Signed,  Loring Liskey Gladis Norton, MD  02/16/2024 9:38 AM

## 2024-02-16 NOTE — Progress Notes (Signed)
 Post dose EKG reviewed AV paced, QT remains stable Continue Tikosyn Anticipate discharge tomorrow afternoon  Charlies Arthur, PA-C

## 2024-02-17 ENCOUNTER — Other Ambulatory Visit (HOSPITAL_COMMUNITY): Payer: Self-pay

## 2024-02-17 DIAGNOSIS — I4819 Other persistent atrial fibrillation: Secondary | ICD-10-CM | POA: Diagnosis not present

## 2024-02-17 LAB — BASIC METABOLIC PANEL WITH GFR
Anion gap: 13 (ref 5–15)
BUN: 19 mg/dL (ref 8–23)
CO2: 23 mmol/L (ref 22–32)
Calcium: 9.1 mg/dL (ref 8.9–10.3)
Chloride: 100 mmol/L (ref 98–111)
Creatinine, Ser: 1.3 mg/dL — ABNORMAL HIGH (ref 0.61–1.24)
GFR, Estimated: 56 mL/min — ABNORMAL LOW (ref >60)
Glucose, Bld: 116 mg/dL — ABNORMAL HIGH (ref 70–99)
Potassium: 3.7 mmol/L (ref 3.5–5.1)
Sodium: 136 mmol/L (ref 135–145)

## 2024-02-17 LAB — MAGNESIUM: Magnesium: 2.1 mg/dL (ref 1.7–2.4)

## 2024-02-17 MED ORDER — POTASSIUM CHLORIDE CRYS ER 20 MEQ PO TBCR
20.0000 meq | EXTENDED_RELEASE_TABLET | Freq: Once | ORAL | Status: AC
  Administered 2024-02-17: 20 meq via ORAL
  Filled 2024-02-17: qty 1

## 2024-02-17 MED ORDER — DOFETILIDE 500 MCG PO CAPS
500.0000 ug | ORAL_CAPSULE | Freq: Two times a day (BID) | ORAL | 5 refills | Status: AC
  Filled 2024-02-17 – 2024-03-12 (×3): qty 60, 30d supply, fill #0
  Filled 2024-04-16: qty 60, 30d supply, fill #1

## 2024-02-17 MED ORDER — POTASSIUM CHLORIDE CRYS ER 20 MEQ PO TBCR
20.0000 meq | EXTENDED_RELEASE_TABLET | Freq: Every day | ORAL | 3 refills | Status: AC
  Filled 2024-02-17 – 2024-03-12 (×2): qty 30, 30d supply, fill #0
  Filled 2024-04-10: qty 30, 30d supply, fill #1

## 2024-02-17 NOTE — Care Management Important Message (Signed)
 Important Message  Patient Details  Name: RONELL DUFFUS MRN: [id number] Date of Birth: 1945-09-12   Important Message Given:  Yes - Medicare IM     Vonzell Arrie Sharps 02/17/2024, 9:50 AM

## 2024-02-17 NOTE — Progress Notes (Signed)
 Reviewed AVS, patient expressed understanding of medications, MD follow up reviewed.    Patient states all belongings brought to the hospital at time of admission are accounted for and packed to take home.   Picked up medications from [facility] pharmacy.  [facility] [facility] contacted to transport patient to entrance A, where family member was waiting in vehicle to transport home.

## 2024-02-17 NOTE — Progress Notes (Signed)
 Post dose EKG, and telemetry reviewed  Stable QTc, maintaining SR Anticipate discharge later today  Charlies Arthur, PA-C

## 2024-02-17 NOTE — Discharge Summary (Addendum)
 ELECTROPHYSIOLOGY PROCEDURE DISCHARGE SUMMARY    Patient ID: Jeff Johnson,  MRN: [id number], DOB/AGE: April 20, 1945 78 y.o.  Admit date: 02/14/2024 Discharge date: 02/17/2024  Primary Care Physician: de [place], Jeff PARAS, MD  Primary Cardiologist: Dr. Lonni Electrophysiologist: Dr. Cindie > Dr. Inocencio  Primary Discharge Diagnosis:  1.  persistent atrial fibrillation status post Tikosyn  loading this admission      CHA2DS2Vasc is 4, on Eliquis   Secondary Discharge Diagnosis:  Symptomatic bradycardia  w/PPM HTN VHD Hx of AVR ([place])  NICM Recovered LVEF > diastolic HF Well compensated  Allergies  Allergen Reactions   Beta Adrenergic Blockers Other (See Comments)    Remote reportedh/o intolerance due to weakness per notes.SABRASABRASABRACalled patient. Advised patient of provider's approval for requested procedure, as well as any comments/instructions from provider.    Iodinated Contrast Media Anaphylaxis   Shellfish Allergy Anaphylaxis and Other (See Comments)   Shellfish Protein-Containing Drug Products Anaphylaxis     Procedures This Admission:  1.  Tikosyn  loading   Brief HPI: Jeff Johnson is a 77 y.o. male with a past medical history as noted above.  Followed in the out patient setting by EP team  Hospital Course:  The patient was admitted and Tikosyn  was initiated.  Renal function and electrolytes were followed during the hospitalization.  The patient's QTc remained stable.  He converted with drug and did not require DCCV.  He was monitored until discharge on telemetry which demonstrated SR/V paced and AV paced.  On the day of discharge, he feels well, was examined by Dr Jeff Johnson who considered the patient stable for discharge to home.  Follow-up has been arranged with the AFib clinic in 1 week and with the EP team in 4 weeks.   Tikosyn  teaching was completed electrolyte replacement for home add KDur 20meq daily  He was taking both losartan  and and Entresto  Plan:  entresto  only going forward  Physical Exam: Vitals:   02/16/24 2021 02/16/24 2349 02/17/24 0603 02/17/24 0813  BP: 95/65 102/62 111/70 102/62  Pulse: 63 63 64 61  Resp: 18 18 16 18   Temp: 98 F (36.7 C) 97.9 F (36.6 C) 97.8 F (36.6 C) (!) 97.4 F (36.3 C)  TempSrc: Oral Oral Oral Oral  SpO2: 95% 97% 98% 97%  Weight:      Height:         GEN- The patient is well appearing, alert and oriented x 3 today.   HEENT: normocephalic, atraumatic; sclera clear, conjunctiva pink; hearing intact; oropharynx clear; neck supple, no JVP Lymph- no cervical lymphadenopathy Lungs- CTA b/l, normal work of breathing.  No wheezes, rales, rhonchi Heart- RRR, no murmurs, rubs or gallops, PMI not laterally displaced GI- soft, non-tender, non-distended Extremities- no clubbing, cyanosis, or edema MS- no significant deformity or atrophy Skin- warm and dry, no rash or lesion Psych- euthymic mood, full affect Neuro- strength and sensation are intact   Labs:   Lab Results  Component Value Date   WBC 5.4 01/17/2024   HGB 14.2 01/17/2024   HCT 43.2 01/17/2024   MCV 89 01/17/2024   PLT 111 (L) 01/17/2024    Recent Labs  Lab 02/17/24 0404  NA 136  K 3.7  CL 100  CO2 23  BUN 19  CREATININE 1.30*  CALCIUM 9.1  GLUCOSE 116*     Discharge Medications:  Allergies as of 02/17/2024       Reactions   Beta Adrenergic Blockers Other (See Comments)   Remote reportedh/o  intolerance due to weakness per notes.SABRASABRASABRACalled patient. Advised patient of provider's approval for requested procedure, as well as any comments/instructions from provider.    Iodinated Contrast Media Anaphylaxis   Shellfish Allergy Anaphylaxis, Other (See Comments)   Shellfish Protein-containing Drug Products Anaphylaxis        Medication List     STOP taking these medications    losartan  25 MG tablet Commonly known as: COZAAR    pantoprazole  40 MG tablet Commonly known as: PROTONIX        TAKE these medications     acetaminophen  650 MG CR tablet Commonly known as: TYLENOL  Take 650 mg by mouth 3 (three) times daily as needed for pain or fever (knee, shoulder pain).   amoxicillin  500 MG capsule Commonly known as: AMOXIL  Take 4 capsules (2,000 mg total) by mouth 1 hour prior to dental procedure.   ASPIRIN  81 PO Take 1 tablet by mouth in the morning.   dofetilide  500 MCG capsule Commonly known as: TIKOSYN  Take 1 capsule (500 mcg total) by mouth 2 (two) times daily.   DRY EYES OP Place 1-2 drops into both eyes in the morning.   Eliquis  5 MG Tabs tablet Generic drug: apixaban  Take 1 tablet (5 mg total) by mouth 2 (two) times daily.   Farxiga  10 MG Tabs tablet Generic drug: dapagliflozin  propanediol Take 1 tablet (10 mg total) by mouth daily before breakfast.   fluticasone  50 MCG/ACT nasal spray Commonly known as: FLONASE  Place 1 spray into both nostrils daily.   furosemide  20 MG tablet Commonly known as: LASIX  Take 1 tablet (20 mg total) by mouth daily. Take additional 20mg  as needed for weight gain of 2 lbs overnight of 5 lbs in one week.   lansoprazole  15 MG capsule Commonly known as: PREVACID  Take 1 capsule (15 mg total) by mouth daily at 12 noon.   montelukast  10 MG tablet Commonly known as: SINGULAIR  Take 1 tablet (10 mg total) by mouth daily.   potassium chloride  SA 20 MEQ tablet Commonly known as: KLOR-CON  M Take 1 tablet (20 mEq total) by mouth daily.   PreserVision AREDS 2 Caps Take 1 capsule by mouth in the morning and at bedtime.   sacubitril -valsartan  24-26 MG Commonly known as: ENTRESTO  Take 1 tablet by mouth 2 (two) times daily.   simvastatin  20 MG tablet Commonly known as: ZOCOR  Take 1 tablet (20 mg total) by mouth daily.   tamsulosin  0.4 MG Caps capsule Commonly known as: FLOMAX  Take 1 capsule (0.4 mg total) by mouth daily.   VITAMIN D  PO Take 5,000 Units by mouth in the morning.        Disposition: home Discharge Instructions     Diet - low  sodium heart healthy   Complete by: As directed    Increase activity slowly   Complete by: As directed         Duration of Discharge Encounter: 15 minutes, APP time.  Jeff Charlies Arthur, PA-C 02/17/2024 11:36 AM   Jeff Johnson was seen by me today along with Charlies Johnson. I have personally performed an evaluation on this patient.  My findings are as follows: 78 y.o. male feeling well.  Is in sinus rhythm.  No acute complaints..  Data: EKG(s) and pertinent labs, studies, etc were personally reviewed and interpreted by me:  EKG, labs, telemetry Otherwise, I agree with data as outlined by the advanced practice provider.  Exam performed by me: Gen: No acute distress Neck: No JVD Cardiac: Regular rhythm Lungs: Normal work  of Extremities: No edema  My Assessment and Plan:  1.  Persistent atrial fibrillation: Has completed dofetilide  load.  QTc has remained stable.  He converted to sinus rhythm without need for cardioversion.  Armend Hochstatter plan for discharge today with follow-up in A-fib clinic.  2.  Secondary hypercoagulable state: Continue Eliquis   3.  Hypertension: Continue home medications  4.  Post AVR: Stable on most recent echo  5.  Chronic systolic heart failure: Due to nonischemic cardiomyopathy.  Ejection fraction is normalized  MD time at discharge: 20 minutes   Signed,  Diangelo Radel Gladis Norton, MD  02/20/2024 9:37 AM

## 2024-02-17 NOTE — Progress Notes (Addendum)
 Pharmacy: Dofetilide  (Tikosyn ) - Follow Up Assessment and Electrolyte Replacement  Pharmacy consulted to assist in monitoring and replacing electrolytes in this 78 y.o. male admitted on 02/14/2024 undergoing dofetilide  initiation.  Labs:    Component Value Date/Time   K 3.7 02/17/2024 0404   MG 2.1 02/17/2024 0404     Plan: Potassium: -Give an additional 20meq potassium today (total= 40meq)  Magnesium : Mg > 2: No additional supplementation needed   Average potassium intake has been about 35meq po daily. Consider discharge on Kdur 20meq po daily  Thank you for allowing pharmacy to participate in this patient's care   Prentice Poisson, PharmD Clinical Pharmacist **Pharmacist phone directory can now be found on [email or web address] ([email or web address]).  Listed under [facility].

## 2024-02-18 ENCOUNTER — Other Ambulatory Visit (HOSPITAL_BASED_OUTPATIENT_CLINIC_OR_DEPARTMENT_OTHER): Payer: Self-pay

## 2024-02-20 ENCOUNTER — Telehealth (HOSPITAL_BASED_OUTPATIENT_CLINIC_OR_DEPARTMENT_OTHER): Payer: Self-pay | Admitting: Cardiology

## 2024-02-20 ENCOUNTER — Telehealth: Payer: Self-pay

## 2024-02-20 ENCOUNTER — Other Ambulatory Visit (HOSPITAL_BASED_OUTPATIENT_CLINIC_OR_DEPARTMENT_OTHER): Payer: Self-pay

## 2024-02-20 ENCOUNTER — Ambulatory Visit (INDEPENDENT_AMBULATORY_CARE_PROVIDER_SITE_OTHER): Admitting: Family Medicine

## 2024-02-20 ENCOUNTER — Encounter (HOSPITAL_BASED_OUTPATIENT_CLINIC_OR_DEPARTMENT_OTHER): Payer: Self-pay | Admitting: Family Medicine

## 2024-02-20 VITALS — BP 95/47 | HR 65 | Temp 97.7°F | Resp 20 | Ht 72.0 in | Wt 248.0 lb

## 2024-02-20 DIAGNOSIS — R21 Rash and other nonspecific skin eruption: Secondary | ICD-10-CM | POA: Insufficient documentation

## 2024-02-20 MED ORDER — TERBINAFINE HCL 1 % EX CREA
TOPICAL_CREAM | CUTANEOUS | 3 refills | Status: AC
  Filled 2024-02-20: qty 30, 15d supply, fill #0
  Filled 2024-02-28: qty 30, 15d supply, fill #1
  Filled 2024-02-29: qty 30, 60d supply, fill #1

## 2024-02-20 NOTE — Telephone Encounter (Signed)
 Patient dropped off AVS from hospital visit. He is taking new medication. And has stopped taking Losartan . AVS will be in providers box.

## 2024-02-20 NOTE — Progress Notes (Signed)
    Procedures performed today:    None.  Independent interpretation of notes and tests performed by another provider:   None.  Brief History, Exam, Impression, and Recommendations:    BP (!) 95/47 (BP Location: Left Arm, Patient Position: Sitting)   Pulse 65   Temp 97.7 F (36.5 C) (Oral)   Resp 20   Ht 6' (1.829 m)   Wt 248 lb (112.5 kg)   SpO2 100%   BMI 33.63 kg/m   Discussed the use of AI scribe software for clinical note transcription with the patient, who gave verbal consent to proceed.  History of Present Illness Jeff Johnson is an 78 year old male with atrial fibrillation who presents with itchy and scratchy feet.  He has been experiencing itchy and scratchy sensations in both feet, with the left foot being more affected. The symptoms have persisted for years but have recently worsened, causing him to wake up twice last night. The itching is severe, with occasional tingling, and can spread up his legs if not managed carefully.  He has been using over-the-counter antifungal treatments, including a one percent cream, likely Lotrimin (clotrimazole), and a spray. The spray provides temporary relief for a couple of hours, but the cream is needed for longer-lasting effects.  He has a history of atrial fibrillation and takes Tikosyn  (dofetilide ) twice daily, at 8 AM and 8 PM. Missing two doses requires hospitalization. He underwent a procedure involving a pacemaker and an ablation. He still experiences some shortness of breath, particularly when getting out of the car, but it has improved since the procedure.  No foot pain, reports severe itching and occasional tingling in the feet.  On exam, patient in no acute distress. Right foot with some redness, slight mottling, skin appears dry. No swelling. Does feel cool to touch. Normal subjective sensation.  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 Current clotrimazole treatment insufficient. - Sent prescription to pharmacy - switch to terbinafine  - Also placed podiatry referral for if symptoms persist.  Orders: -     Ambulatory referral to Podiatry  Other orders -     Terbinafine  HCl; Apply to affected area BID until rash gone, then apply 2 more days.  Dispense: 30 g; Refill: 3  Return if symptoms worsen or fail to improve.   ___________________________________________ Derwood Becraft de [place], MD, ABFM, CAQSM Primary Care and Sports Medicine [facility]

## 2024-02-20 NOTE — Telephone Encounter (Signed)
 Placed the patient's hospital AVS in outgoing mail back to patient.

## 2024-02-20 NOTE — Transitions of Care (Post Inpatient/ED Visit) (Signed)
   02/20/2024  Name: Jeff Johnson MRN: [id number] DOB: 1945/12/12  Today's TOC FU Call Status: Today's TOC FU Call Status:: Unsuccessful Call (1st Attempt) Unsuccessful Call (1st Attempt) Date: 02/20/24  Attempted to reach the patient regarding the most recent Inpatient/ED visit.  Follow Up Plan: Additional outreach attempts will be made to reach the patient to complete the Transitions of Care (Post Inpatient/ED visit) call.   Arvin Seip RN, BSN, CCM [employer], Population Health Case Manager Phone: [phone]

## 2024-02-20 NOTE — Assessment & Plan Note (Signed)
 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 Current clotrimazole treatment insufficient. - Sent prescription to pharmacy - switch to terbinafine  - Also placed podiatry referral for if symptoms persist.

## 2024-02-21 ENCOUNTER — Telehealth: Payer: Self-pay

## 2024-02-21 NOTE — Transitions of Care (Post Inpatient/ED Visit) (Signed)
 02/21/2024  Name: Jeff Johnson MRN: [id number] DOB: [birth year]  Today's TOC FU Call Status: Today's TOC FU Call Status:: Successful TOC FU Call Completed TOC FU Call Complete Date: 02/21/24  Patient's Name and Date of Birth confirmed. Name, DOB  Transition Care Management Follow-up Telephone Call Date of Discharge: 02/17/24 Discharge Facility: [facility]) Type of Discharge: Inpatient Admission Primary Inpatient Discharge Diagnosis:: persistent atrial fibrillation status post Tikosyn  loading/ symptomatic bradycardia with PPM. How have you been since you were released from the hospital?: Better Any questions or concerns?: No  Items Reviewed: Did you receive and understand the discharge instructions provided?: Yes Medications obtained,verified, and reconciled?: Yes (Medications Reviewed) Any new allergies since your discharge?: No Dietary orders reviewed?: Yes Type of Diet Ordered:: low salt heart healthy Do you have support at home?: Yes People in Home [RPT]: spouse Name of Support/Comfort Primary Source: shelley Korenek  Medications Reviewed Today: Medications Reviewed Today     Reviewed by Rodolfo Gaster E, RN (Registered Nurse) on 02/21/24 at 1334  Med List Status: <None>   Medication Order Taking? Sig Documenting Provider Last Dose Status Informant  acetaminophen  (TYLENOL ) 650 MG CR tablet [id number] Yes Take 650 mg by mouth 3 (three) times daily as needed for pain or fever (knee, shoulder pain). [provider]  Active Self, Pharmacy Records  amoxicillin  (AMOXIL ) 500 MG capsule [id number] Yes Take 4 capsules (2,000 mg total) by mouth 1 hour prior to dental procedure.   Active Self, Pharmacy Records  apixaban  (ELIQUIS ) 5 MG TABS tablet [id number] Yes Take 1 tablet (5 mg total) by mouth 2 (two) times daily. Terra Fairy PARAS, PA-C  Active Self, Pharmacy Records  Artificial Tear Ointment (DRY EYES OP) [id number] Yes Place 1-2 drops into both eyes in the morning. [provider]  Active Self, Pharmacy Records  ASPIRIN  81 PO [id number] Yes Take 1 tablet by mouth in the morning. [provider]  Active Self, Pharmacy Records  Cholecalciferol (VITAMIN D  PO) [id number] Yes Take 5,000 Units by mouth in the morning. [provider]  Active Self, Pharmacy Records  dapagliflozin  propanediol (FARXIGA ) 10 MG TABS tablet [id number] Yes Take 1 tablet (10 mg total) by mouth daily before breakfast. Walker, Caitlin S, NP  Active Self, Pharmacy Records  dofetilide  (TIKOSYN ) 500 MCG capsule [id number] Yes Take 1 capsule (500 mcg total) by mouth 2 (two) times daily. Leverne Charlies Helling, PA-C  Active   fluticasone  (FLONASE ) 50 MCG/ACT nasal spray [id number] Yes Place 1 spray into both nostrils daily. [provider]  Active Self, Pharmacy Records  furosemide  (LASIX ) 20 MG tablet [id number] Yes Take 1 tablet (20 mg total) by mouth daily. Take additional 20mg  as needed for weight gain of 2 lbs overnight of 5 lbs in one week. Vannie Reche RAMAN, NP  Active Self, Pharmacy Records  lansoprazole  (PREVACID ) 15 MG capsule [id number] Yes Take 1 capsule (15 mg total) by mouth daily at 12 noon. de [place], Raymond J, MD  Active Self, Pharmacy Records  montelukast  (SINGULAIR ) 10 MG tablet [id number] Yes Take 1 tablet (10 mg total) by mouth daily. de [place], Quintin PARAS, MD  Active Self, Pharmacy Records  Multiple Vitamins-Minerals (PRESERVISION AREDS 2) CAPS [id number] Yes Take 1 capsule by mouth in the morning and at bedtime. [provider]  Active Self, Pharmacy Records  potassium chloride  SA (KLOR-CON  M) 20 MEQ tablet [id number] Yes Take 1 tablet (20 mEq total) by mouth daily. Leverne Charlies Helling, PA-C  Active   sacubitril -valsartan  (ENTRESTO ) 24-26  MG [id number] Yes Take 1 tablet by mouth 2 (two) times daily. [provider]  Active Self, Pharmacy Records  simvastatin  (ZOCOR ) 20 MG tablet [id number] Yes Take 1 tablet (20 mg total) by mouth daily. de [place], Raymond J, MD   Active Self, Pharmacy Records  tamsulosin  (FLOMAX ) 0.4 MG CAPS capsule [id number] Yes Take 1 capsule (0.4 mg total) by mouth daily. de [place], Raymond J, MD  Active Self, Pharmacy Records  terbinafine  (LAMISIL ) 1 % cream [id number] Yes Apply to affected area(s) twice daily until rash gone, then apply 2 more days. de [place], Quintin PARAS, MD  Active             Home Care and Equipment/Supplies: Were Home Health Services Ordered?: No Any new equipment or medical supplies ordered?: No  Functional Questionnaire: Do you need assistance with bathing/showering or dressing?: No Do you need assistance with meal preparation?: No Do you need assistance with eating?: No Do you have difficulty maintaining continence: No Do you need assistance with getting out of bed/getting out of a chair/moving?: No Do you have difficulty managing or taking your medications?: No  Follow up appointments reviewed: PCP Follow-up appointment confirmed?: No (patient states he will have his wife or daughter call for an appointment) Specialist Hospital Follow-up appointment confirmed?: Yes Date of Specialist follow-up appointment?: 02/24/24 Follow-Up Specialty Provider:: Fairy Shell, pa Do you need transportation to your follow-up appointment?: No Do you understand care options if your condition(s) worsen?: Yes-patient verbalized understanding  SDOH Interventions Today    Flowsheet Row Most Recent Value  SDOH Interventions   Food Insecurity Interventions Intervention Not Indicated  Housing Interventions Intervention Not Indicated  Transportation Interventions Intervention Not Indicated  Utilities Interventions Intervention Not Indicated   Discussed and offered 30 day TOC program.  Patient declined services.   The patient has been provided with contact information for the care management team and has been advised to call with any health -related questions or concerns.  The patient verbalized understanding with current  plan of care.  The patient is directed to their insurance card regarding availability of benefits coverage.    Arvin Seip RN, BSN, CCM [employer], Population Health Case Manager Phone: [phone]

## 2024-02-21 NOTE — Patient Instructions (Signed)
 Visit Information  Thank you for taking time to visit with me today. Please don't hesitate to contact me if I can be of assistance to you.  Patient instructions Advise to take medications as prescribed Advised to notify provider  Monitor weight daily and record.  Notify provider for increase weight gain of 2 lbs overnight or 5 lbs in a week.  Follow prescribed action plan for weight gain. Monitor blood pressure/ pulse daily. Notify provider of readings outside of established parameters Reports symptoms of dizziness, fainting , palpitations rapid heartbeat to provider immediately. Seek emergency medical services for severe symptoms Follow written instructions provided to you by the doctor regarding your Tikosyn    Patient verbalizes understanding of instructions and care plan provided today and agrees to view in MyChart. Active MyChart status and patient understanding of how to access instructions and care plan via MyChart confirmed with patient.     The patient has been provided with contact information for the care management team and has been advised to call with any health related questions or concerns.   Please call the care guide team at [phone] if you need to cancel or reschedule your appointment.   Please call the Suicide and Crisis Lifeline: 988 call the [place]  National Suicide Prevention Lifeline: 220 175 8385 or TTY: 416-755-7484 TTY 872-352-4268) to talk to a trained counselor call 1-800-273-TALK (toll free, 24 hour hotline) if you are experiencing a Mental Health or Behavioral Health Crisis or need someone to talk to.  Arvin Seip RN, BSN, CCM [employer], Population Health Case Manager Phone: [phone]

## 2024-02-23 ENCOUNTER — Other Ambulatory Visit (HOSPITAL_BASED_OUTPATIENT_CLINIC_OR_DEPARTMENT_OTHER): Payer: Self-pay

## 2024-02-23 ENCOUNTER — Telehealth: Payer: Self-pay | Admitting: Cardiology

## 2024-02-23 NOTE — Telephone Encounter (Signed)
 Pt c/o medication issue:  1. Name of Medication: dofetilide  (TIKOSYN ) 500 MCG capsule   2. How are you currently taking this medication (dosage and times per day)? As written   3. Are you having a reaction (difficulty breathing--STAT)? no  4. What is your medication issue?   Pt wants to take medication for a cold but he is scared to mix medications with the new above medication he is taking. Please advise.

## 2024-02-23 NOTE — Telephone Encounter (Signed)
 Spoke with patient regarding cough and cold. Pt on Tikosyn  and requesting advice on what medications he can take over the counter. Pt advised if need prescription to contact PCP.  Will send to Dr. Lonni and pharmacy for any further recommendations. Thank you!

## 2024-02-24 ENCOUNTER — Ambulatory Visit: Payer: Medicare Other

## 2024-02-24 ENCOUNTER — Encounter (HOSPITAL_COMMUNITY): Payer: Self-pay | Admitting: Internal Medicine

## 2024-02-24 ENCOUNTER — Ambulatory Visit (HOSPITAL_COMMUNITY): Admission: RE | Admit: 2024-02-24 | Discharge: 2024-02-24 | Attending: Internal Medicine | Admitting: Internal Medicine

## 2024-02-24 VITALS — BP 90/56 | HR 75 | Ht 72.0 in | Wt 240.6 lb

## 2024-02-24 DIAGNOSIS — D6869 Other thrombophilia: Secondary | ICD-10-CM | POA: Diagnosis not present

## 2024-02-24 DIAGNOSIS — Z5181 Encounter for therapeutic drug level monitoring: Secondary | ICD-10-CM

## 2024-02-24 DIAGNOSIS — Z79899 Other long term (current) drug therapy: Secondary | ICD-10-CM | POA: Diagnosis not present

## 2024-02-24 DIAGNOSIS — I4819 Other persistent atrial fibrillation: Secondary | ICD-10-CM

## 2024-02-24 DIAGNOSIS — I480 Paroxysmal atrial fibrillation: Secondary | ICD-10-CM | POA: Diagnosis not present

## 2024-02-24 NOTE — Progress Notes (Addendum)
 Primary Care Physician: de [place], Quintin PARAS, MD Primary Cardiologist: Shelda Bruckner, MD Electrophysiologist: OLE ONEIDA HOLTS, MD     Referring Physician: [facility] clinic alert     Jeff Johnson is a 78 y.o. male with a history of HLD, AAA, AI s/p AVR, symptomatic bradycardia, Mobitz II AV block s/p PPM, prostate cancer, systolic HF, chronic diastolic heart failure, HTN, BPH, and atrial fibrillation who presents for consultation in the [facility]. Device clinic alert on 3/31 for ongoing Afib with RVR since 3/30. Patient is on ASA 81 mg daily in addition to Eliquis .. He has a CHADS2VASC score of 4. S/p Afib ablation on 08/29/23 by Dr. Holts.   Follow-up 02/24/2024 for 1 week Tikosyn  surveillance.  S/p Tikosyn  admission 12/2-07/2023.  Patient converted chemically to SR and did not require DCCV.  Patient discharged on Tikosyn  500 mcg twice daily.  Potassium 20 mEq daily was prescribed at discharge.  Patient continued on Entresto  and losartan  was discontinued at discharge.  Patient has noted to not feel well since Tuesday.  He has developed congestion and a cough.  He was attributing it to the new medication.  He called the cardiology office yesterday asking about what medication he can take for cough and congestion but is upset that he has not heard back anything yet.  He has not missed any doses of Tikosyn  or Eliquis .  Today, he denies symptoms of orthopnea, PND, lower extremity edema, dizziness, presyncope, syncope, snoring, daytime somnolence, bleeding, or neurologic sequela. The patient is tolerating medications without difficulties and is otherwise without complaint today.    he has a BMI of Body mass index is 32.63 kg/m.SABRA Filed Weights   02/24/24 1112  Weight: 109.1 kg     Current Outpatient Medications  Medication Sig Dispense Refill   acetaminophen  (TYLENOL ) 650 MG CR tablet Take 650 mg by mouth 3 (three) times daily as needed for pain or fever  (knee, shoulder pain).     amoxicillin  (AMOXIL ) 500 MG capsule Take 4 capsules (2,000 mg total) by mouth 1 hour prior to dental procedure. 12 capsule 1   apixaban  (ELIQUIS ) 5 MG TABS tablet Take 1 tablet (5 mg total) by mouth 2 (two) times daily. 60 tablet 11   Artificial Tear Ointment (DRY EYES OP) Place 1-2 drops into both eyes in the morning.     ASPIRIN  81 PO Take 1 tablet by mouth in the morning.     Cholecalciferol (VITAMIN D  PO) Take 5,000 Units by mouth in the morning.     dapagliflozin  propanediol (FARXIGA ) 10 MG TABS tablet Take 1 tablet (10 mg total) by mouth daily before breakfast. 90 tablet 2   dofetilide  (TIKOSYN ) 500 MCG capsule Take 1 capsule (500 mcg total) by mouth 2 (two) times daily. 60 capsule 5   fluticasone  (FLONASE ) 50 MCG/ACT nasal spray Place 1 spray into both nostrils daily.     furosemide  (LASIX ) 20 MG tablet Take 1 tablet (20 mg total) by mouth daily. Take additional 20mg  as needed for weight gain of 2 lbs overnight of 5 lbs in one week. 135 tablet 1   lansoprazole  (PREVACID ) 15 MG capsule Take 1 capsule (15 mg total) by mouth daily at 12 noon. 30 capsule 3   montelukast  (SINGULAIR ) 10 MG tablet Take 1 tablet (10 mg total) by mouth daily. 30 tablet 3   Multiple Vitamins-Minerals (PRESERVISION AREDS 2) CAPS Take 1 capsule by mouth in the morning and at bedtime.  potassium chloride  SA (KLOR-CON  M) 20 MEQ tablet Take 1 tablet (20 mEq total) by mouth daily. 30 tablet 3   sacubitril -valsartan  (ENTRESTO ) 24-26 MG Take 1 tablet by mouth 2 (two) times daily.     simvastatin  (ZOCOR ) 20 MG tablet Take 1 tablet (20 mg total) by mouth daily. 30 tablet 3   tamsulosin  (FLOMAX ) 0.4 MG CAPS capsule Take 1 capsule (0.4 mg total) by mouth daily. 30 capsule 6   terbinafine  (LAMISIL ) 1 % cream Apply to affected area(s) twice daily until rash gone, then apply 2 more days. 30 g 3   No current facility-administered medications for this encounter.    Atrial Fibrillation Management  history:  Previous antiarrhythmic drugs: amiodarone , Tikosyn  Previous cardioversions: none Previous ablations: 08/29/23 Anticoagulation history: Eliquis    ROS- All systems are reviewed and negative except as per the HPI above.  Physical Exam: BP (!) 90/56   Pulse 75   Ht 6' (1.829 m)   Wt 109.1 kg   BMI 32.63 kg/m   GEN- The patient is well appearing, alert and oriented x 3 today.   Neck - no JVD or carotid bruit noted Lungs- Clear to ausculation bilaterally, normal work of breathing Heart- Regular rate and rhythm, no murmurs, rubs or gallops, PMI not laterally displaced Extremities- no clubbing, cyanosis, or edema Skin - no rash or ecchymosis noted   EKG today demonstrates  EKG Interpretation Date/Time:  Friday February 24 2024 11:16:27 EST Ventricular Rate:  75 PR Interval:  210 QRS Duration:  126 QT Interval:  438 QTC Calculation: 489 R Axis:   253  Text Interpretation: Atrial-sensed ventricular-paced rhythm with prolonged AV conduction Abnormal ECG When compared with ECG of 17-Feb-2024 10:33, PREVIOUS ECG IS PRESENT Confirmed by Terra Pac ([id number]) on 02/24/2024 11:17:50 AM     Echo 02/02/2024:  1. Left ventricular ejection fraction, by estimation, is 55 to 60%. Left  ventricular ejection fraction by 3D volume is 56 %. The left ventricle has  normal function. The left ventricle has no regional wall motion  abnormalities. There is mild concentric  left ventricular hypertrophy. Left ventricular diastolic function could  not be evaluated.   2. Right ventricular systolic function is mildly reduced. The right  ventricular size is mildly enlarged. There is normal pulmonary artery  systolic pressure. The estimated right ventricular systolic pressure is  28.0 mmHg.   3. Left atrial size was mild to moderately dilated.   4. The mitral valve is normal in structure. No evidence of mitral valve  regurgitation. No evidence of mitral stenosis.   5. The aortic valve has  been repaired/replaced. Aortic valve  regurgitation is not visualized. 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 Procedure Date: 2011. Echo findings  are consistent with normal structure and  function of the aortic valve prosthesis. Aortic valve mean gradient  measures 9.0 mmHg. Aortic valve Vmax measures 2.04 m/s. Aortic valve  acceleration time measures 67 msec.   6. Aortic dilatation noted and root/ascending aorta has been  repaired/replaced. There is moderate dilatation of the aortic root,  measuring 45 mm.   7. The inferior vena cava is normal in size with greater than 50%  respiratory variability, suggesting right atrial pressure of 3 mmHg.    ASSESSMENT & PLAN CHA2DS2-VASc Score = 4  The patient's score is based upon: CHF History: 1 HTN History: 1 Diabetes History: 0 Stroke History: 0 Vascular Disease History: 0 Age Score: 2 Gender Score: 0  ASSESSMENT AND PLAN: Persistent Atrial Fibrillation (ICD10:  I48.0) The patient's CHA2DS2-VASc score is 4, indicating a 4.8% annual risk of stroke.   S/p Afib ablation on 08/29/23 by Dr. Cindie. Patient was previously on amiodarone  and he stopped it due to nausea, vomiting, and reflux. S/p Tikosyn  admission 12/2-07/2023.  Patient appears to be maintaining SR.  We will continue with current management with no medication changes at this time.  Recommendations given for patient regarding over-the-counter medications to take for cough and congestion that do not interact with Tikosyn .  Tikosyn  teaching revisited with emphasis on avoiding any products that has Benadryl.  I recommended patient follow-up PCP next week regarding his URI.  After discussion with patient and wife, I do not believe his cough is specifically attributed to Tikosyn  and appears to be more likely related to acute URI.  I recommended to patient that he can contact the A-fib clinic with any questions about any new medications to take  along with Tikosyn .  High risk medication monitoring (ICD10: [id number]) Patient requires ongoing monitoring for anti-arrhythmic medication which has the potential to cause life threatening arrhythmias or AV block. Qtc stable. Continue Tikosyn  500 mcg twice daily. Basic metabolic panel and magnesium  level drawn today.  Secondary Hypercoagulable State (ICD10:  D68.69) The patient is at significant risk for stroke/thromboembolism based upon his CHA2DS2-VASc Score of 4. Continue Apixaban  (Eliquis ).  Continue Eliquis  5 mg twice daily.  Dosage is correct based on weight greater than 60 kg and creatinine below 1.5.    Follow-up in 1 month for Tikosyn  surveillance as scheduled with Jodie Passey, PA-C.   Terra Pac, PA-C  Afib Clinic [facility] [address] [place], [place] [address] [phone]

## 2024-02-24 NOTE — Patient Instructions (Addendum)
 May take Mucinex, Zyrtec Robitussin- (nothing with DM or Pseudoephedrine) Follow up with PCP about cough to rule out respiratory virus

## 2024-02-24 NOTE — Addendum Note (Signed)
 Encounter addended by: Janel Nancy SAUNDERS, RN on: 02/24/2024 11:42 AM  Actions taken: Clinical Note Signed

## 2024-02-25 LAB — CUP PACEART REMOTE DEVICE CHECK
Battery Remaining Longevity: 109 mo
Battery Remaining Percentage: 91 %
Battery Voltage: 3.01 V
Brady Statistic AP VP Percent: 50 %
Brady Statistic AP VS Percent: 1 %
Brady Statistic AS VP Percent: 50 %
Brady Statistic AS VS Percent: 1 %
Brady Statistic RA Percent Paced: 49 %
Brady Statistic RV Percent Paced: 99 %
Date Time Interrogation Session: 20251212031940
Implantable Lead Connection Status: 753985
Implantable Lead Connection Status: 753985
Implantable Lead Implant Date: 20241212
Implantable Lead Implant Date: 20241212
Implantable Lead Location: 753859
Implantable Lead Location: 753860
Implantable Pulse Generator Implant Date: 20241212
Lead Channel Impedance Value: 430 Ohm
Lead Channel Impedance Value: 460 Ohm
Lead Channel Pacing Threshold Amplitude: 0.625 V
Lead Channel Pacing Threshold Amplitude: 0.75 V
Lead Channel Pacing Threshold Pulse Width: 0.5 ms
Lead Channel Pacing Threshold Pulse Width: 0.5 ms
Lead Channel Sensing Intrinsic Amplitude: 12 mV
Lead Channel Sensing Intrinsic Amplitude: 4.1 mV
Lead Channel Setting Pacing Amplitude: 0.875
Lead Channel Setting Pacing Amplitude: 1.75 V
Lead Channel Setting Pacing Pulse Width: 0.5 ms
Lead Channel Setting Sensing Sensitivity: 2 mV
Pulse Gen Model: 2272
Pulse Gen Serial Number: 8224079

## 2024-02-25 LAB — BASIC METABOLIC PANEL WITH GFR
BUN/Creatinine Ratio: 17 (ref 10–24)
BUN: 28 mg/dL — ABNORMAL HIGH (ref 8–27)
CO2: 21 mmol/L (ref 20–29)
Calcium: 9.2 mg/dL (ref 8.6–10.2)
Chloride: 97 mmol/L (ref 96–106)
Creatinine, Ser: 1.69 mg/dL — ABNORMAL HIGH (ref 0.76–1.27)
Glucose: 124 mg/dL — ABNORMAL HIGH (ref 70–99)
Potassium: 4.5 mmol/L (ref 3.5–5.2)
Sodium: 136 mmol/L (ref 134–144)
eGFR: 41 mL/min/1.73 — ABNORMAL LOW (ref >59)

## 2024-02-25 LAB — MAGNESIUM: Magnesium: 2.3 mg/dL (ref 1.6–2.3)

## 2024-02-27 ENCOUNTER — Ambulatory Visit (HOSPITAL_COMMUNITY): Payer: Self-pay | Admitting: Internal Medicine

## 2024-02-27 NOTE — Telephone Encounter (Signed)
 Do not take any benadryl Ok to take Delsym or mucinex for cough Ok to take Flonase  and or saline rinse for congestion Ok to take tylenol  for fever/pain Ok to take zyrtec for runny nose

## 2024-02-27 NOTE — Telephone Encounter (Signed)
 Shared message from clinical pharmacist with patient, reviewed recommended OTC cold medicine. Patient verbalized understanding and expressed appreciation for follow-up.

## 2024-02-28 ENCOUNTER — Other Ambulatory Visit (HOSPITAL_BASED_OUTPATIENT_CLINIC_OR_DEPARTMENT_OTHER): Payer: Self-pay

## 2024-02-29 ENCOUNTER — Other Ambulatory Visit (HOSPITAL_BASED_OUTPATIENT_CLINIC_OR_DEPARTMENT_OTHER): Payer: Self-pay

## 2024-03-01 NOTE — Progress Notes (Signed)
 Remote PPM Transmission

## 2024-03-02 ENCOUNTER — Ambulatory Visit: Payer: Self-pay | Admitting: Cardiology

## 2024-03-07 ENCOUNTER — Other Ambulatory Visit (HOSPITAL_BASED_OUTPATIENT_CLINIC_OR_DEPARTMENT_OTHER): Payer: Self-pay

## 2024-03-07 ENCOUNTER — Other Ambulatory Visit: Payer: Self-pay

## 2024-03-07 ENCOUNTER — Other Ambulatory Visit (HOSPITAL_BASED_OUTPATIENT_CLINIC_OR_DEPARTMENT_OTHER): Payer: Self-pay | Admitting: Family Medicine

## 2024-03-07 ENCOUNTER — Ambulatory Visit: Admitting: Podiatry

## 2024-03-07 DIAGNOSIS — I872 Venous insufficiency (chronic) (peripheral): Secondary | ICD-10-CM

## 2024-03-07 DIAGNOSIS — I71019 Dissection of thoracic aorta, unspecified: Secondary | ICD-10-CM

## 2024-03-07 MED ORDER — TRIAMCINOLONE ACETONIDE 0.1 % EX CREA
TOPICAL_CREAM | CUTANEOUS | 6 refills | Status: AC
  Filled 2024-03-07: qty 80, 20d supply, fill #0
  Filled 2024-03-20 – 2024-03-22 (×2): qty 80, 20d supply, fill #1
  Filled 2024-04-09 – 2024-04-13 (×2): qty 80, 20d supply, fill #2
  Filled 2024-04-13: qty 90, 25d supply, fill #2
  Filled 2024-04-17 – 2024-04-18 (×2): qty 80, 20d supply, fill #2

## 2024-03-07 NOTE — Progress Notes (Signed)
 Patient presents complaint of rash on the feet and lower legs and ankles bilaterally.  Has been using multiple antifungal creams monitor over the years.  Says it keeps coming back.  Has congestive heart failure and has had swelling in the legs for many years.  Has manage not had any open lesions with clear drainage.   Physical exam:  General appearance: Pleasant, and in no acute distress. AOx3.  Vascular: Pedal pulses: DP 2/4 bilaterally, PT 1/4 bilaterally.  Moderate to severe edema lower legs bilaterally. Capillary fill time bilaterally.  Neurological: Light touch intact feet bilaterally.  Normal Achilles reflex bilaterally.  No clonus or spasticity noted.   Dermatologic:   Red erythematous rash with atrophy of skin on the dorsal aspect of the foot and around the ankles and lower legs right greater than left.  Musculoskeletal: No tenderness with palpation of the dorsal or plantar foot.  No tenderness around the ankle.  Diagnosis: 1.  Stasis dermatitis lower legs and feet bilaterally  Plan: -New patient office evaluation and management level 3. -I discussed with him the stasis dermatitis secondary to lymphedema from the congestive heart failure.  Strongly recommended compression hose BK 20 to 30 mmHg open or close toed.  The more he can control the edema the less the dermatitis will be a problem.  -Rx triamcinolone  acetonide cream 0.1%, apply 1 g twice daily to feet and ankles.  Return 6 weeks follow-up

## 2024-03-09 ENCOUNTER — Other Ambulatory Visit: Payer: Self-pay

## 2024-03-09 ENCOUNTER — Other Ambulatory Visit (HOSPITAL_BASED_OUTPATIENT_CLINIC_OR_DEPARTMENT_OTHER): Payer: Self-pay

## 2024-03-09 MED ORDER — SIMVASTATIN 20 MG PO TABS
20.0000 mg | ORAL_TABLET | Freq: Every day | ORAL | 3 refills | Status: AC
  Filled 2024-03-09: qty 30, 30d supply, fill #0
  Filled 2024-04-06: qty 30, 30d supply, fill #1

## 2024-03-10 ENCOUNTER — Other Ambulatory Visit (HOSPITAL_BASED_OUTPATIENT_CLINIC_OR_DEPARTMENT_OTHER): Payer: Self-pay

## 2024-03-12 ENCOUNTER — Telehealth (HOSPITAL_BASED_OUTPATIENT_CLINIC_OR_DEPARTMENT_OTHER): Payer: Self-pay | Admitting: Family

## 2024-03-12 ENCOUNTER — Other Ambulatory Visit: Payer: Self-pay

## 2024-03-12 ENCOUNTER — Other Ambulatory Visit (HOSPITAL_BASED_OUTPATIENT_CLINIC_OR_DEPARTMENT_OTHER): Payer: Self-pay

## 2024-03-12 MED ORDER — SILDENAFIL CITRATE 100 MG PO TABS
50.0000 mg | ORAL_TABLET | Freq: Every day | ORAL | 1 refills | Status: AC | PRN
  Filled 2024-03-12: qty 10, 10d supply, fill #0

## 2024-03-12 NOTE — Telephone Encounter (Signed)
 Reviewed Caitlin's comments with patient Jeff Johnson stated Jeff Johnson does not use NTG   Asked that I send to PCP Dr everitt Cuba, will forward as requested

## 2024-03-12 NOTE — Telephone Encounter (Signed)
 Would be fine to utilize Sildenafil  for Tadalafil for ED, whichever is recommended by his PCP. From a cardiac perspective, needs to ensure that his BP is staying at goal <130/80. He does not take nitroglycerin  so no concern for interaction there.  Izacc Demeyer S Isabellamarie Randa, NP

## 2024-03-12 NOTE — Addendum Note (Signed)
 Addended by: DE CUBA, Carin Shipp J on: 03/12/2024 04:47 PM   Modules accepted: Orders

## 2024-03-12 NOTE — Telephone Encounter (Signed)
 Spoke with patient who was wanting to know if he could take anything for ED given his medications/heart conditions  Will forward to Caitlin W NP for review

## 2024-03-12 NOTE — Telephone Encounter (Signed)
 Pt stopped by and wanted erectile dysfunction meds from primary care, and was told that the cardiologist had to sign off on meds. Please advise and contact patient.

## 2024-03-16 ENCOUNTER — Other Ambulatory Visit (HOSPITAL_BASED_OUTPATIENT_CLINIC_OR_DEPARTMENT_OTHER): Payer: Self-pay | Admitting: Family

## 2024-03-16 ENCOUNTER — Other Ambulatory Visit (HOSPITAL_BASED_OUTPATIENT_CLINIC_OR_DEPARTMENT_OTHER): Payer: Self-pay

## 2024-03-19 ENCOUNTER — Other Ambulatory Visit (HOSPITAL_BASED_OUTPATIENT_CLINIC_OR_DEPARTMENT_OTHER): Payer: Self-pay

## 2024-03-19 MED ORDER — SACUBITRIL-VALSARTAN 24-26 MG PO TABS
1.0000 | ORAL_TABLET | Freq: Two times a day (BID) | ORAL | 3 refills | Status: AC
  Filled 2024-03-19: qty 180, 90d supply, fill #0

## 2024-03-20 ENCOUNTER — Other Ambulatory Visit (HOSPITAL_BASED_OUTPATIENT_CLINIC_OR_DEPARTMENT_OTHER): Payer: Self-pay

## 2024-03-22 ENCOUNTER — Encounter: Payer: Self-pay | Admitting: Student

## 2024-03-22 ENCOUNTER — Other Ambulatory Visit (HOSPITAL_BASED_OUTPATIENT_CLINIC_OR_DEPARTMENT_OTHER): Payer: Self-pay

## 2024-03-22 ENCOUNTER — Ambulatory Visit: Attending: Student | Admitting: Student

## 2024-03-22 VITALS — BP 100/58 | HR 68 | Ht 72.0 in | Wt 246.0 lb

## 2024-03-22 DIAGNOSIS — I4819 Other persistent atrial fibrillation: Secondary | ICD-10-CM | POA: Diagnosis not present

## 2024-03-22 DIAGNOSIS — D6869 Other thrombophilia: Secondary | ICD-10-CM

## 2024-03-22 DIAGNOSIS — I10 Essential (primary) hypertension: Secondary | ICD-10-CM

## 2024-03-22 DIAGNOSIS — I480 Paroxysmal atrial fibrillation: Secondary | ICD-10-CM

## 2024-03-22 LAB — BASIC METABOLIC PANEL WITH GFR
BUN/Creatinine Ratio: 11 (ref 10–24)
BUN: 15 mg/dL (ref 8–27)
CO2: 22 mmol/L (ref 20–29)
Calcium: 9.5 mg/dL (ref 8.6–10.2)
Chloride: 103 mmol/L (ref 96–106)
Creatinine, Ser: 1.34 mg/dL — ABNORMAL HIGH (ref 0.76–1.27)
Glucose: 106 mg/dL — ABNORMAL HIGH (ref 70–99)
Potassium: 4.1 mmol/L (ref 3.5–5.2)
Sodium: 140 mmol/L (ref 134–144)
eGFR: 54 mL/min/1.73 — ABNORMAL LOW

## 2024-03-22 LAB — MAGNESIUM: Magnesium: 2 mg/dL (ref 1.6–2.3)

## 2024-03-22 NOTE — Patient Instructions (Signed)
 Medication Instructions:  No medication changes today. *If you need a refill on your cardiac medications before your next appointment, please call your pharmacy*  Lab Work: BMET and Magnesium  today. If you have labs (blood work) drawn today and your tests are completely normal, you will receive your results only by: MyChart Message (if you have MyChart) OR A paper copy in the mail If you have any lab test that is abnormal or we need to change your treatment, we will call you to review the results.  Testing/Procedures: No testing ordered today  Follow-Up: At [facility], you and your health needs are our priority.  As part of our continuing mission to provide you with exceptional heart care, our providers are all part of one team.  This team includes your primary Cardiologist (physician) and Advanced Practice Providers or APPs (Physician Assistants and Nurse Practitioners) who all work together to provide you with the care you need, when you need it.  Your next appointment:   3 month(s)  Provider:   You may see Fonda Kitty, MD or one of the following Advanced Practice Providers on your designated Care Team:   Charlies Arthur, PA-C Andrews Tener Andy Shantal Roan, PA-C Suzann Riddle, NP Daphne Barrack, NP    We recommend signing up for the patient portal called MyChart.  Sign up information is provided on this After Visit Summary.  MyChart is used to connect with patients for Virtual Visits (Telemedicine).  Patients are able to view lab/test results, encounter notes, upcoming appointments, etc.  Non-urgent messages can be sent to your provider as well.   To learn more about what you can do with MyChart, go to [email or web address].

## 2024-03-22 NOTE — Progress Notes (Signed)
" °  Electrophysiology Office Note:   ID:  Jeff Johnson, Jeff Johnson 04-Jun-1945, MRN [id number]  Primary Cardiologist: Shelda Bruckner, MD Electrophysiologist: Fonda Kitty, MD      History of Present Illness:   Jeff Johnson is a 79 y.o. male with h/o HLD, AAA, AI s/p AVR, symptomatic bradycardia, Mobitz II AV block s/p PPM, prostate cancer, systolic HF, chronic diastolic heart failure, HTN, BPH, and atrial fibrillation seen today for routine electrophysiology followup for 1 month Tikosyn  follow up  Since last being seen in our clinic the patient reports doing very well. No breakthrough arrhythmia of which he is aware. Otherwise, he denies chest pain, palpitations, dyspnea, PND, orthopnea, nausea, vomiting, dizziness, syncope, edema, weight gain, or early satiety.   Review of systems complete and found to be negative unless listed in HPI.   EP Information / Studies Reviewed:    EKG is ordered today. Personal review as below.  EKG Interpretation Date/Time:  Thursday March 22 2024 10:11:34 EST Ventricular Rate:  68 PR Interval:    QRS Duration:  126 QT Interval:  446 QTC Calculation: 474 R Axis:   254  Text Interpretation: NSR with Ventricular-paced rhythm Confirmed by Lesia Heck ([id number]) on 03/22/2024 10:14:14 AM    PPM Interrogation-  Not performed today.  Arrhythmia/Device History Abbott Dual Chamber PPM implanted 02/24/2023 for Second Degree AV block  S/p PVI and posterior wall ablation 08/29/2023   Physical Exam:   VS:  BP (!) 100/58   Pulse 68   Ht 6' (1.829 m)   Wt 246 lb (111.6 kg)   SpO2 96%   BMI 33.36 kg/m    Wt Readings from Last 3 Encounters:  03/22/24 246 lb (111.6 kg)  02/24/24 240 lb 9.6 oz (109.1 kg)  02/20/24 248 lb (112.5 kg)     GEN: No acute distress  NECK: No JVD; No carotid bruits CARDIAC: Regular rate and rhythm, no murmurs, rubs, gallops RESPIRATORY:  Clear to auscultation without rales, wheezing or rhonchi  ABDOMEN: Soft, non-tender,  non-distended EXTREMITIES:  No edema; No deformity   ASSESSMENT AND PLAN:    Symptomatic bradycardia s/p Abbott PPM  Normal PPM function by most recent remote No testing performed today.   Persistent AF Secondary hypercoagulable state S/p PVI 08/2023 EKG today shows NSR with stable intervals Continue eliquis  5 mg BID for CHA2DS2VASc of at least 4 Continue Tikosyn  500 mcg BID Surveillance labs today.     HTN Stable on current regimen      Disposition:   Follow up with EP Team in 3 months  Signed, Ozell Prentice Lesia, PA-C  "

## 2024-03-23 ENCOUNTER — Ambulatory Visit: Payer: Self-pay | Admitting: Student

## 2024-03-26 ENCOUNTER — Other Ambulatory Visit (HOSPITAL_BASED_OUTPATIENT_CLINIC_OR_DEPARTMENT_OTHER): Payer: Self-pay

## 2024-03-26 ENCOUNTER — Other Ambulatory Visit: Payer: Self-pay

## 2024-03-26 ENCOUNTER — Other Ambulatory Visit (HOSPITAL_BASED_OUTPATIENT_CLINIC_OR_DEPARTMENT_OTHER): Payer: Self-pay | Admitting: Family Medicine

## 2024-03-26 DIAGNOSIS — I71019 Dissection of thoracic aorta, unspecified: Secondary | ICD-10-CM

## 2024-03-26 MED ORDER — LANSOPRAZOLE 15 MG PO CPDR
15.0000 mg | DELAYED_RELEASE_CAPSULE | Freq: Every day | ORAL | 3 refills | Status: AC
  Filled 2024-03-26: qty 30, 30d supply, fill #0

## 2024-03-26 MED ORDER — MONTELUKAST SODIUM 10 MG PO TABS
10.0000 mg | ORAL_TABLET | Freq: Every day | ORAL | 3 refills | Status: AC
  Filled 2024-03-26: qty 30, 30d supply, fill #0

## 2024-03-30 ENCOUNTER — Other Ambulatory Visit: Payer: Self-pay

## 2024-04-11 ENCOUNTER — Other Ambulatory Visit (HOSPITAL_BASED_OUTPATIENT_CLINIC_OR_DEPARTMENT_OTHER): Payer: Self-pay

## 2024-04-12 ENCOUNTER — Other Ambulatory Visit (HOSPITAL_BASED_OUTPATIENT_CLINIC_OR_DEPARTMENT_OTHER): Payer: Self-pay

## 2024-04-12 ENCOUNTER — Other Ambulatory Visit: Payer: Self-pay

## 2024-04-13 ENCOUNTER — Other Ambulatory Visit: Payer: Self-pay

## 2024-04-13 ENCOUNTER — Other Ambulatory Visit (HOSPITAL_BASED_OUTPATIENT_CLINIC_OR_DEPARTMENT_OTHER): Payer: Self-pay

## 2024-04-13 ENCOUNTER — Ambulatory Visit (HOSPITAL_BASED_OUTPATIENT_CLINIC_OR_DEPARTMENT_OTHER): Admitting: Family Medicine

## 2024-04-15 ENCOUNTER — Encounter (HOSPITAL_BASED_OUTPATIENT_CLINIC_OR_DEPARTMENT_OTHER): Payer: Self-pay | Admitting: *Deleted

## 2024-04-16 ENCOUNTER — Ambulatory Visit (HOSPITAL_BASED_OUTPATIENT_CLINIC_OR_DEPARTMENT_OTHER): Admitting: Family Medicine

## 2024-04-16 ENCOUNTER — Other Ambulatory Visit (HOSPITAL_BASED_OUTPATIENT_CLINIC_OR_DEPARTMENT_OTHER): Payer: Self-pay

## 2024-04-17 ENCOUNTER — Ambulatory Visit: Admitting: Podiatry

## 2024-04-17 ENCOUNTER — Encounter: Payer: Self-pay | Admitting: Podiatry

## 2024-04-17 DIAGNOSIS — I872 Venous insufficiency (chronic) (peripheral): Secondary | ICD-10-CM

## 2024-04-17 NOTE — Progress Notes (Signed)
 Patient presents for follow-up stasis dermatitis feet bilaterally.  Got compression hose and has been wearing these.  Using triamcinolone  cream on the rash feet ankles and lower legs bilaterally.  Says it is doing much better.   Physical exam:  General appearance: Pleasant, and in no acute distress. AOx3.  Vascular: Pedal pulses: DP 2/4 bilaterally, PT 1/4 bilaterally.  Moderate edema lower legs bilaterally. Capillary fill time immediate bilaterall.  Neurological: Light touch intact feet bilaterally.  Normal Achilles reflex bilaterally.    Dermatologic:   Rash is much improved from a 9095% better.  Just a small little bit of the erythema on the medial aspect of the ankles.  Lower legs and dorsal foot is clear.  No signs of cellulitis.  Skin normal temperature bilaterally.  Skin normal color, tone, and texture bilaterally.   Musculoskeletal:    Diagnosis: 1.  Stasis dermatitis lower legs and feet bilaterally  Plan: -Establish office visit for evaluation and management level 2 -States dermatitis is almost completely resolved continue using the triamcinolone  cream for another week or 2 should be resolved by then.  He has gotten compression hose.  Just told him to wear these daily consistently and that should prevent a lot of the problems.  If he does continue have problems or work breaks down again or does not clear he can call us  for an appointment  Return return as needed

## 2024-04-18 ENCOUNTER — Ambulatory Visit (HOSPITAL_BASED_OUTPATIENT_CLINIC_OR_DEPARTMENT_OTHER): Admitting: Cardiology

## 2024-04-18 ENCOUNTER — Other Ambulatory Visit (HOSPITAL_BASED_OUTPATIENT_CLINIC_OR_DEPARTMENT_OTHER): Payer: Self-pay

## 2024-04-18 ENCOUNTER — Other Ambulatory Visit: Payer: Self-pay

## 2024-04-18 ENCOUNTER — Encounter (HOSPITAL_BASED_OUTPATIENT_CLINIC_OR_DEPARTMENT_OTHER): Payer: Self-pay | Admitting: Cardiology

## 2024-04-18 VITALS — BP 96/64 | HR 62 | Ht 72.0 in | Wt 247.0 lb

## 2024-04-18 DIAGNOSIS — D6869 Other thrombophilia: Secondary | ICD-10-CM | POA: Diagnosis not present

## 2024-04-18 DIAGNOSIS — Z952 Presence of prosthetic heart valve: Secondary | ICD-10-CM

## 2024-04-18 DIAGNOSIS — I480 Paroxysmal atrial fibrillation: Secondary | ICD-10-CM

## 2024-04-18 DIAGNOSIS — I5032 Chronic diastolic (congestive) heart failure: Secondary | ICD-10-CM

## 2024-04-18 DIAGNOSIS — E782 Mixed hyperlipidemia: Secondary | ICD-10-CM | POA: Diagnosis not present

## 2024-04-18 DIAGNOSIS — Z7901 Long term (current) use of anticoagulants: Secondary | ICD-10-CM | POA: Diagnosis not present

## 2024-04-18 NOTE — Patient Instructions (Signed)
 Medication Instructions:  Your physician recommends that you continue on your current medications as directed. Please refer to the Current Medication list given to you today.  *If you need a refill on your cardiac medications before your next appointment, please call your pharmacy*  Lab Work: NONE  Testing/Procedures: NONE  Follow-Up: At [facility], you and your health needs are our priority.  As part of our continuing mission to provide you with exceptional heart care, we have created designated Provider Care Teams.  These Care Teams include your primary Cardiologist (physician) and Advanced Practice Providers (APPs -  Physician Assistants and Nurse Practitioners) who all work together to provide you with the care you need, when you need it.  We recommend signing up for the patient portal called MyChart.  Sign up information is provided on this After Visit Summary.  MyChart is used to connect with patients for Virtual Visits (Telemedicine).  Patients are able to view lab/test results, encounter notes, upcoming appointments, etc.  Non-urgent messages can be sent to your provider as well.   To learn more about what you can do with MyChart, go to [email or web address].    Your next appointment:   3 month(s)  The format for your next appointment:   In Person  Provider:   Dr Lonni, Reche ORN NP, or Rosaline RAMAN NP   Let us  know if your blood pressure remains low

## 2024-04-20 ENCOUNTER — Ambulatory Visit (HOSPITAL_BASED_OUTPATIENT_CLINIC_OR_DEPARTMENT_OTHER): Admitting: Family Medicine

## 2024-04-20 ENCOUNTER — Encounter (HOSPITAL_BASED_OUTPATIENT_CLINIC_OR_DEPARTMENT_OTHER): Payer: Self-pay | Admitting: Family Medicine

## 2024-04-20 VITALS — BP 74/36 | HR 62 | Resp 20 | Ht 72.0 in | Wt 246.0 lb

## 2024-04-20 DIAGNOSIS — I1A0 Resistant hypertension: Secondary | ICD-10-CM

## 2024-04-20 NOTE — Progress Notes (Unsigned)
" ° ° °  Procedures performed today:    None.  Independent interpretation of notes and tests performed by another provider:   None.  Brief History, Exam, Impression, and Recommendations:    BP (!) 74/36 (BP Location: Left Arm, Patient Position: Sitting, Cuff Size: Large)   Pulse 62   Resp 20   Ht 6' (1.829 m)   Wt 246 lb (111.6 kg)   SpO2 100%   BMI 33.36 kg/m   Resistant hypertension Assessment & Plan: Patient continues with Entresto  as per cardiology.  He has been checking blood pressure regularly at home and these have been running low.  He denies any new symptoms.  He reports that at baseline he does have some orthostatic symptoms, however these are unchanged over the past few years. Blood pressure is low in office today.  He is currently asymptomatic regarding this.  Cardiovascular exam with regular rate and rhythm, does have systolic murmur present.  Lungs clear to auscultation bilaterally. We discussed considerations, given that he is currently asymptomatic, recommend continuing to monitor blood pressure closely.  Encouraged to continue with close follow-up with cardiology for monitoring and management.  He recently saw his cardiologist who recommended close monitoring and contacting them if any change   Return in about 4 months (around 08/18/2024) for hypertension, med check.   ___________________________________________ Jarris Kortz de [place], MD, ABFM, CAQSM [facility] [employer] "

## 2024-04-20 NOTE — Assessment & Plan Note (Signed)
 Patient continues with Entresto  as per cardiology.  He has been checking blood pressure regularly at home and these have been running low.  He denies any new symptoms.  He reports that at baseline he does have some orthostatic symptoms, however these are unchanged over the past few years. Blood pressure is low in office today.  He is currently asymptomatic regarding this.  Cardiovascular exam with regular rate and rhythm, does have systolic murmur present.  Lungs clear to auscultation bilaterally. We discussed considerations, given that he is currently asymptomatic, recommend continuing to monitor blood pressure closely.  Encouraged to continue with close follow-up with cardiology for monitoring and management.  He recently saw his cardiologist who recommended close monitoring and contacting them if any change

## 2024-06-20 ENCOUNTER — Ambulatory Visit: Admitting: Student

## 2024-07-17 ENCOUNTER — Ambulatory Visit (HOSPITAL_BASED_OUTPATIENT_CLINIC_OR_DEPARTMENT_OTHER): Admitting: Cardiology

## 2024-07-18 ENCOUNTER — Ambulatory Visit (HOSPITAL_BASED_OUTPATIENT_CLINIC_OR_DEPARTMENT_OTHER): Admitting: Family Medicine

## 2024-09-11 ENCOUNTER — Encounter (HOSPITAL_BASED_OUTPATIENT_CLINIC_OR_DEPARTMENT_OTHER)
